# Patient Record
Sex: Female | Born: 1940 | Race: Black or African American | Hispanic: No | Marital: Married | Smoking: Never smoker
Health system: Southern US, Community
[De-identification: ages and names within clinical notes are randomized; demographics above are authoritative.]

## PROBLEM LIST (undated history)

## (undated) ENCOUNTER — Emergency Department (HOSPITAL_BASED_OUTPATIENT_CLINIC_OR_DEPARTMENT_OTHER): Admission: EM

## (undated) DIAGNOSIS — Z9889 Other specified postprocedural states: Secondary | ICD-10-CM

## (undated) DIAGNOSIS — G8929 Other chronic pain: Secondary | ICD-10-CM

## (undated) DIAGNOSIS — G971 Other reaction to spinal and lumbar puncture: Secondary | ICD-10-CM

## (undated) DIAGNOSIS — M545 Low back pain, unspecified: Secondary | ICD-10-CM

## (undated) DIAGNOSIS — M48061 Spinal stenosis, lumbar region without neurogenic claudication: Secondary | ICD-10-CM

## (undated) DIAGNOSIS — Z972 Presence of dental prosthetic device (complete) (partial): Secondary | ICD-10-CM

## (undated) DIAGNOSIS — Z8619 Personal history of other infectious and parasitic diseases: Secondary | ICD-10-CM

## (undated) DIAGNOSIS — I2510 Atherosclerotic heart disease of native coronary artery without angina pectoris: Secondary | ICD-10-CM

## (undated) DIAGNOSIS — M542 Cervicalgia: Secondary | ICD-10-CM

## (undated) DIAGNOSIS — K759 Inflammatory liver disease, unspecified: Secondary | ICD-10-CM

## (undated) DIAGNOSIS — Z9289 Personal history of other medical treatment: Secondary | ICD-10-CM

## (undated) DIAGNOSIS — R599 Enlarged lymph nodes, unspecified: Secondary | ICD-10-CM

## (undated) DIAGNOSIS — Z8719 Personal history of other diseases of the digestive system: Secondary | ICD-10-CM

## (undated) DIAGNOSIS — I2584 Coronary atherosclerosis due to calcified coronary lesion: Secondary | ICD-10-CM

## (undated) DIAGNOSIS — G5621 Lesion of ulnar nerve, right upper limb: Secondary | ICD-10-CM

## (undated) DIAGNOSIS — K2970 Gastritis, unspecified, without bleeding: Secondary | ICD-10-CM

## (undated) DIAGNOSIS — I10 Essential (primary) hypertension: Secondary | ICD-10-CM

## (undated) DIAGNOSIS — M1990 Unspecified osteoarthritis, unspecified site: Secondary | ICD-10-CM

## (undated) DIAGNOSIS — Z973 Presence of spectacles and contact lenses: Secondary | ICD-10-CM

## (undated) DIAGNOSIS — Z8489 Family history of other specified conditions: Secondary | ICD-10-CM

## (undated) DIAGNOSIS — K219 Gastro-esophageal reflux disease without esophagitis: Secondary | ICD-10-CM

## (undated) DIAGNOSIS — R112 Nausea with vomiting, unspecified: Secondary | ICD-10-CM

## (undated) HISTORY — PX: BREAST SURGERY: SHX581

## (undated) HISTORY — PX: COLONOSCOPY W/ BIOPSIES AND POLYPECTOMY: SHX1376

## (undated) HISTORY — PX: PATELLA FRACTURE SURGERY: SHX735

## (undated) HISTORY — PX: CHOLECYSTECTOMY OPEN: SUR202

## (undated) HISTORY — PX: TOTAL KNEE ARTHROPLASTY: SHX125

## (undated) HISTORY — PX: CATARACT EXTRACTION W/ INTRAOCULAR LENS IMPLANT: SHX1309

## (undated) HISTORY — DX: Atherosclerotic heart disease of native coronary artery without angina pectoris: I25.10

## (undated) HISTORY — PX: ABDOMINAL HERNIA REPAIR: SHX539

## (undated) HISTORY — PX: BLADDER SUSPENSION: SHX72

## (undated) HISTORY — DX: Coronary atherosclerosis due to calcified coronary lesion: I25.84

## (undated) HISTORY — PX: REPLACEMENT TOTAL KNEE: SUR1224

## (undated) HISTORY — PX: MULTIPLE TOOTH EXTRACTIONS: SHX2053

## (undated) HISTORY — PX: CARPAL TUNNEL RELEASE: SHX101

## (undated) HISTORY — PX: DILATION AND CURETTAGE OF UTERUS: SHX78

## (undated) HISTORY — PX: REDUCTION MAMMAPLASTY: SUR839

## (undated) HISTORY — PX: JOINT REPLACEMENT: SHX530

## (undated) HISTORY — PX: BREAST CYST EXCISION: SHX579

## (undated) HISTORY — PX: APPENDECTOMY: SHX54

## (undated) HISTORY — PX: HERNIA REPAIR: SHX51

## (undated) HISTORY — PX: BACK SURGERY: SHX140

## (undated) HISTORY — PX: CARDIAC CATHETERIZATION: SHX172

---

## 1970-11-05 HISTORY — PX: VAGINAL HYSTERECTOMY: SUR661

## 1979-07-07 HISTORY — PX: CHOLECYSTECTOMY OPEN: SUR202

## 1990-11-05 DIAGNOSIS — Z9289 Personal history of other medical treatment: Secondary | ICD-10-CM | POA: Insufficient documentation

## 2002-11-05 HISTORY — PX: REDUCTION MAMMAPLASTY: SUR839

## 2003-01-08 ENCOUNTER — Encounter: Payer: Self-pay | Admitting: Internal Medicine

## 2003-01-08 ENCOUNTER — Encounter: Admission: RE | Admit: 2003-01-08 | Discharge: 2003-01-08 | Payer: Self-pay | Admitting: Internal Medicine

## 2004-11-14 ENCOUNTER — Encounter: Admission: RE | Admit: 2004-11-14 | Discharge: 2004-11-14 | Payer: Self-pay | Admitting: Internal Medicine

## 2009-07-06 HISTORY — PX: CARPAL TUNNEL RELEASE: SHX101

## 2010-03-07 ENCOUNTER — Ambulatory Visit: Payer: Self-pay | Admitting: Diagnostic Radiology

## 2010-03-07 ENCOUNTER — Emergency Department (HOSPITAL_BASED_OUTPATIENT_CLINIC_OR_DEPARTMENT_OTHER): Admission: EM | Admit: 2010-03-07 | Discharge: 2010-03-07 | Payer: Self-pay | Admitting: Emergency Medicine

## 2010-04-24 ENCOUNTER — Encounter: Admission: RE | Admit: 2010-04-24 | Discharge: 2010-04-24 | Payer: Self-pay | Admitting: Internal Medicine

## 2010-06-12 ENCOUNTER — Ambulatory Visit (HOSPITAL_BASED_OUTPATIENT_CLINIC_OR_DEPARTMENT_OTHER)
Admission: RE | Admit: 2010-06-12 | Discharge: 2010-06-12 | Payer: Self-pay | Admitting: Physical Medicine and Rehabilitation

## 2010-06-12 ENCOUNTER — Ambulatory Visit: Payer: Self-pay | Admitting: Radiology

## 2012-11-05 HISTORY — PX: CATARACT EXTRACTION W/ INTRAOCULAR LENS IMPLANT: SHX1309

## 2012-11-05 HISTORY — PX: TRANSURETHRAL RESECTION OF BLADDER TUMOR WITH GYRUS (TURBT-GYRUS): SHX6458

## 2013-11-05 HISTORY — PX: ANTERIOR AND POSTERIOR VAGINAL REPAIR: SUR5

## 2013-11-23 ENCOUNTER — Ambulatory Visit
Admission: RE | Admit: 2013-11-23 | Discharge: 2013-11-23 | Disposition: A | Discharge status: Home / Self Care | Payer: 59 | Source: Ambulatory Visit | Attending: Internal Medicine | Admitting: Internal Medicine

## 2013-11-23 ENCOUNTER — Other Ambulatory Visit: Payer: Self-pay | Admitting: Internal Medicine

## 2013-11-23 DIAGNOSIS — M25552 Pain in left hip: Secondary | ICD-10-CM

## 2014-03-17 DIAGNOSIS — N816 Rectocele: Secondary | ICD-10-CM | POA: Insufficient documentation

## 2014-04-12 DIAGNOSIS — N8112 Cystocele, lateral: Secondary | ICD-10-CM | POA: Insufficient documentation

## 2014-05-04 DIAGNOSIS — R339 Retention of urine, unspecified: Secondary | ICD-10-CM | POA: Insufficient documentation

## 2014-05-04 DIAGNOSIS — IMO0002 Reserved for concepts with insufficient information to code with codable children: Secondary | ICD-10-CM | POA: Insufficient documentation

## 2014-06-15 DIAGNOSIS — H9319 Tinnitus, unspecified ear: Secondary | ICD-10-CM | POA: Insufficient documentation

## 2014-06-15 DIAGNOSIS — M19049 Primary osteoarthritis, unspecified hand: Secondary | ICD-10-CM | POA: Insufficient documentation

## 2014-06-15 DIAGNOSIS — G4700 Insomnia, unspecified: Secondary | ICD-10-CM | POA: Insufficient documentation

## 2014-06-15 DIAGNOSIS — M47817 Spondylosis without myelopathy or radiculopathy, lumbosacral region: Secondary | ICD-10-CM | POA: Insufficient documentation

## 2014-06-15 DIAGNOSIS — J019 Acute sinusitis, unspecified: Secondary | ICD-10-CM | POA: Insufficient documentation

## 2014-06-15 DIAGNOSIS — N393 Stress incontinence (female) (male): Secondary | ICD-10-CM | POA: Insufficient documentation

## 2014-06-15 DIAGNOSIS — R040 Epistaxis: Secondary | ICD-10-CM | POA: Insufficient documentation

## 2014-06-15 DIAGNOSIS — H93239 Hyperacusis, unspecified ear: Secondary | ICD-10-CM | POA: Insufficient documentation

## 2014-06-15 DIAGNOSIS — N329 Bladder disorder, unspecified: Secondary | ICD-10-CM | POA: Insufficient documentation

## 2014-06-15 DIAGNOSIS — J301 Allergic rhinitis due to pollen: Secondary | ICD-10-CM | POA: Insufficient documentation

## 2014-06-15 DIAGNOSIS — H9209 Otalgia, unspecified ear: Secondary | ICD-10-CM | POA: Insufficient documentation

## 2014-06-15 DIAGNOSIS — M19039 Primary osteoarthritis, unspecified wrist: Secondary | ICD-10-CM | POA: Insufficient documentation

## 2014-06-15 DIAGNOSIS — R202 Paresthesia of skin: Secondary | ICD-10-CM | POA: Insufficient documentation

## 2014-06-15 DIAGNOSIS — R10814 Left lower quadrant abdominal tenderness: Secondary | ICD-10-CM | POA: Insufficient documentation

## 2014-06-15 DIAGNOSIS — R32 Unspecified urinary incontinence: Secondary | ICD-10-CM | POA: Insufficient documentation

## 2014-10-25 ENCOUNTER — Emergency Department (HOSPITAL_BASED_OUTPATIENT_CLINIC_OR_DEPARTMENT_OTHER)
Admission: EM | Admit: 2014-10-25 | Discharge: 2014-10-25 | Disposition: A | Discharge status: Home / Self Care | Payer: Medicare Other | Attending: Emergency Medicine | Admitting: Emergency Medicine

## 2014-10-25 ENCOUNTER — Encounter (HOSPITAL_BASED_OUTPATIENT_CLINIC_OR_DEPARTMENT_OTHER): Payer: Self-pay | Admitting: *Deleted

## 2014-10-25 ENCOUNTER — Emergency Department (HOSPITAL_BASED_OUTPATIENT_CLINIC_OR_DEPARTMENT_OTHER): Payer: Medicare Other

## 2014-10-25 DIAGNOSIS — K219 Gastro-esophageal reflux disease without esophagitis: Secondary | ICD-10-CM | POA: Diagnosis not present

## 2014-10-25 DIAGNOSIS — R1012 Left upper quadrant pain: Secondary | ICD-10-CM | POA: Diagnosis present

## 2014-10-25 DIAGNOSIS — Z90710 Acquired absence of both cervix and uterus: Secondary | ICD-10-CM | POA: Insufficient documentation

## 2014-10-25 DIAGNOSIS — R112 Nausea with vomiting, unspecified: Secondary | ICD-10-CM | POA: Diagnosis not present

## 2014-10-25 DIAGNOSIS — M1990 Unspecified osteoarthritis, unspecified site: Secondary | ICD-10-CM | POA: Diagnosis not present

## 2014-10-25 DIAGNOSIS — Z9049 Acquired absence of other specified parts of digestive tract: Secondary | ICD-10-CM | POA: Diagnosis not present

## 2014-10-25 DIAGNOSIS — R197 Diarrhea, unspecified: Secondary | ICD-10-CM | POA: Diagnosis not present

## 2014-10-25 DIAGNOSIS — I10 Essential (primary) hypertension: Secondary | ICD-10-CM | POA: Insufficient documentation

## 2014-10-25 DIAGNOSIS — R109 Unspecified abdominal pain: Secondary | ICD-10-CM

## 2014-10-25 DIAGNOSIS — Z79899 Other long term (current) drug therapy: Secondary | ICD-10-CM | POA: Diagnosis not present

## 2014-10-25 DIAGNOSIS — Z880 Allergy status to penicillin: Secondary | ICD-10-CM | POA: Insufficient documentation

## 2014-10-25 HISTORY — DX: Gastritis, unspecified, without bleeding: K29.70

## 2014-10-25 HISTORY — DX: Essential (primary) hypertension: I10

## 2014-10-25 HISTORY — DX: Gastro-esophageal reflux disease without esophagitis: K21.9

## 2014-10-25 HISTORY — DX: Unspecified osteoarthritis, unspecified site: M19.90

## 2014-10-25 LAB — URINALYSIS, ROUTINE W REFLEX MICROSCOPIC
BILIRUBIN URINE: NEGATIVE
GLUCOSE, UA: NEGATIVE mg/dL
HGB URINE DIPSTICK: NEGATIVE
KETONES UR: NEGATIVE mg/dL
Leukocytes, UA: NEGATIVE
Nitrite: NEGATIVE
PROTEIN: NEGATIVE mg/dL
SPECIFIC GRAVITY, URINE: 1.013 (ref 1.005–1.030)
Urobilinogen, UA: 0.2 mg/dL (ref 0.0–1.0)
pH: 5.5 (ref 5.0–8.0)

## 2014-10-25 LAB — CBC WITH DIFFERENTIAL/PLATELET
BASOS PCT: 0 % (ref 0–1)
Basophils Absolute: 0 10*3/uL (ref 0.0–0.1)
EOS ABS: 0.2 10*3/uL (ref 0.0–0.7)
EOS PCT: 3 % (ref 0–5)
HEMATOCRIT: 39 % (ref 36.0–46.0)
HEMOGLOBIN: 12.5 g/dL (ref 12.0–15.0)
LYMPHS PCT: 30 % (ref 12–46)
Lymphs Abs: 2.3 10*3/uL (ref 0.7–4.0)
MCH: 28.5 pg (ref 26.0–34.0)
MCHC: 32.1 g/dL (ref 30.0–36.0)
MCV: 88.8 fL (ref 78.0–100.0)
MONO ABS: 0.7 10*3/uL (ref 0.1–1.0)
Monocytes Relative: 10 % (ref 3–12)
Neutro Abs: 4.2 10*3/uL (ref 1.7–7.7)
Neutrophils Relative %: 57 % (ref 43–77)
Platelets: 247 10*3/uL (ref 150–400)
RBC: 4.39 MIL/uL (ref 3.87–5.11)
RDW: 13.8 % (ref 11.5–15.5)
WBC: 7.5 10*3/uL (ref 4.0–10.5)

## 2014-10-25 LAB — COMPREHENSIVE METABOLIC PANEL
ALT: 11 U/L (ref 0–35)
ANION GAP: 10 (ref 5–15)
AST: 13 U/L (ref 0–37)
Albumin: 3.6 g/dL (ref 3.5–5.2)
Alkaline Phosphatase: 56 U/L (ref 39–117)
BILIRUBIN TOTAL: 0.3 mg/dL (ref 0.3–1.2)
BUN: 8 mg/dL (ref 6–23)
CALCIUM: 8.9 mg/dL (ref 8.4–10.5)
CHLORIDE: 105 meq/L (ref 96–112)
CO2: 27 meq/L (ref 19–32)
CREATININE: 0.8 mg/dL (ref 0.50–1.10)
GFR calc Af Amer: 82 mL/min — ABNORMAL LOW (ref >90)
GFR, EST NON AFRICAN AMERICAN: 71 mL/min — AB (ref >90)
GLUCOSE: 105 mg/dL — AB (ref 70–99)
Potassium: 4.2 mEq/L (ref 3.7–5.3)
Sodium: 142 mEq/L (ref 137–147)
Total Protein: 6.7 g/dL (ref 6.0–8.3)

## 2014-10-25 LAB — LIPASE, BLOOD: LIPASE: 19 U/L (ref 11–59)

## 2014-10-25 LAB — TROPONIN I

## 2014-10-25 MED ORDER — ONDANSETRON HCL 4 MG PO TABS: 4.0000 mg | ORAL_TABLET | Freq: Four times a day (QID) | ORAL | Status: DC

## 2014-10-25 MED ORDER — DICYCLOMINE HCL 10 MG PO CAPS: 10.0000 mg | ORAL_CAPSULE | Freq: Three times a day (TID) | ORAL | Status: DC | PRN

## 2014-10-25 MED ORDER — GI COCKTAIL ~~LOC~~
30.0000 mL | Freq: Once | ORAL | Status: AC
  Administered 2014-10-25: 30 mL via ORAL
  Filled 2014-10-25: qty 30

## 2014-10-25 MED ORDER — HYDROMORPHONE HCL 1 MG/ML IJ SOLN
1.0000 mg | Freq: Once | INTRAMUSCULAR | Status: AC
  Administered 2014-10-25 (×2): 1 mg via INTRAVENOUS
  Filled 2014-10-25: qty 1

## 2014-10-25 MED ORDER — HYDROMORPHONE HCL 1 MG/ML IJ SOLN
INTRAMUSCULAR | Status: AC
  Administered 2014-10-25: 1 mg via INTRAVENOUS
  Filled 2014-10-25: qty 1

## 2014-10-25 MED ORDER — SODIUM CHLORIDE 0.9 % IV BOLUS (SEPSIS)
500.0000 mL | Freq: Once | INTRAVENOUS | Status: AC
  Administered 2014-10-25: 500 mL via INTRAVENOUS

## 2014-10-25 MED ORDER — ONDANSETRON HCL 4 MG/2ML IJ SOLN
4.0000 mg | Freq: Once | INTRAMUSCULAR | Status: AC
  Administered 2014-10-25: 4 mg via INTRAVENOUS
  Filled 2014-10-25: qty 2

## 2014-10-25 NOTE — ED Notes (Signed)
Pt states she had a "bad reaction" to contrast during a previous scan "years ago". Pt states "they told me never to take contrast again." dr. Ralene Bathe alerted, orders scan without contrast.

## 2014-10-25 NOTE — ED Notes (Signed)
Abdominal pain. Hx of gastritis. Vomiting.

## 2014-10-25 NOTE — ED Provider Notes (Signed)
CSN: [id number]     Arrival date & time 10/25/14  1438 History  This chart was scribed for Quintella Reichert, MD by [facility], ED Scribe. The patient was seen in MH10/MH10 and the patient's care was started at 3:05 PM.    Chief Complaint  Patient presents with  . Abdominal Pain  . Emesis   HPI  HPI Comments: Wendy Carrillo is a 73 y.o. female with a history of HTN and stomach ulcers who presents to the Emergency Department complaining of constant, gradually worsening upper abdominal pain, onset 4 days ago. She describes the pain as a bloated, gassy sensation. The pain radiates to her back and left flank. Pt has chills, vomiting x3, mucous and diarrhea as associated symptoms. She has tried medication and herbal tea for relief.  Pt has eaten little food since onset. Symptoms are moderate, intermittent, worsening. She denies dysuria and fever.  Pt does not drink alcohol or take blood thinners.   Past Medical History  Diagnosis Date  . Gastritis   . Hypertension   . GERD (gastroesophageal reflux disease)   . Arthritis    Past Surgical History  Procedure Laterality Date  . Replacement total knee    . Cholecystectomy    . Abdominal hysterectomy    . Appendectomy    . Bladder tack    . Carpal tunnel release     No family history on file. History  Substance Use Topics  . Smoking status: Never Smoker   . Smokeless tobacco: Not on file  . Alcohol Use: No   OB History    No data available     Review of Systems  Constitutional: Positive for chills. Negative for fever.  Gastrointestinal: Positive for vomiting, abdominal pain and diarrhea.  Genitourinary: Positive for flank pain. Negative for dysuria.  Musculoskeletal: Positive for back pain.      Allergies  Penicillins  Home Medications   Prior to Admission medications   Medication Sig Start Date End Date Taking? Authorizing Provider  Celecoxib (CELEBREX PO) Take by mouth.   Yes Historical Provider, MD   OMEPRAZOLE PO Take by mouth.   Yes Historical Provider, MD  RAMIPRIL PO Take by mouth.   Yes Historical Provider, MD   BP 127/63 mmHg  Pulse 70  Temp(Src) 98 F (36.7 C) (Oral)  Resp 18  Ht 5' 4.5" (1.638 m)  Wt 242 lb (109.77 kg)  BMI 40.91 kg/m2  SpO2 100% Physical Exam  Constitutional: She is oriented to person, place, and time. She appears well-developed and well-nourished.  HENT:  Head: Normocephalic and atraumatic.  Cardiovascular: Normal rate and regular rhythm.   No murmur heard. Pulmonary/Chest: Effort normal and breath sounds normal. No respiratory distress.  Abdominal: Soft. There is no rebound and no guarding.  Mild epigastric tenderness without guarding or rebound  Musculoskeletal: She exhibits no edema or tenderness.  Neurological: She is alert and oriented to person, place, and time.  Skin: Skin is warm and dry.  Psychiatric: She has a normal mood and affect. Her behavior is normal.  Nursing note and vitals reviewed.   ED Course  Procedures  DIAGNOSTIC STUDIES: Oxygen Saturation is 100% on room air, normal by my interpretation.    COORDINATION OF CARE: 3:10 PM Discussed treatment plan with pt at bedside and pt agreed to plan.  Labs Review Labs Reviewed  COMPREHENSIVE METABOLIC PANEL - Abnormal; Notable for the following:    Glucose, Bld 105 (*)    GFR calc non Af  Amer 71 (*)    GFR calc Af Amer 82 (*)    All other components within normal limits  TROPONIN I  LIPASE, BLOOD  CBC WITH DIFFERENTIAL  URINALYSIS, ROUTINE W REFLEX MICROSCOPIC    Imaging Review Ct Abdomen Pelvis Wo Contrast  10/25/2014   CLINICAL DATA:  Right lower quadrant abdominal pain. History of gastritis. Reported allergy to contrast, no further information available. Referring physician declined premedication.  EXAM: CT ABDOMEN AND PELVIS WITHOUT CONTRAST  TECHNIQUE: Multidetector CT imaging of the abdomen and pelvis was performed following the standard protocol without IV contrast.   COMPARISON:  06/23/2013  FINDINGS: Lung bases are clear.  Unenhanced CT was performed per clinician order. Lack of IV contrast limits sensitivity and specificity, especially for evaluation of abdominal/pelvic solid viscera. Moderate coronary arterial calcification identified. Heart size normal. Small hiatal hernia. Cholecystectomy clips are noted. Anterior abdominal wall calcified sutures incidentally noted. Unenhanced liver, adrenal glands, kidneys, spleen, and pancreas are normal. 5.0 cm right upper renal pole cortical cyst identified image 38. No free air or fluid. No lymphadenopathy.  Normal appendix. No bowel wall thickening or focal segmental dilatation is identified. Bladder is decompressed but unremarkable. Uterus and ovaries are normal. Mild atheromatous aortic calcification without aneurysm. Multilevel disc degenerative change noted without acute osseous abnormality.  IMPRESSION: No acute intra-abdominal or pelvic pathology.   Electronically Signed   By: Conchita Paris M.D.   On: 10/25/2014 20:46     EKG Interpretation   Date/Time:  Monday October 25 2014 15:36:54 EST Ventricular Rate:  71 PR Interval:  168 QRS Duration: 82 QT Interval:  412 QTC Calculation: 447 R Axis:   -23 Text Interpretation:  Sinus rhythm with Premature atrial complexes  Moderate voltage criteria for LVH, may be normal variant Borderline ECG  Confirmed by Hazle Coca [phone]) on 10/25/2014 4:04:11 PM      MDM   Final diagnoses:  Nausea vomiting and diarrhea  Left upper quadrant pain   Patient here for evaluation of epigastric and left upper quadrant abdominal pain. Patient did have vomiting and diarrhea prior to ED arrival, this is resolved in the emergency department. Clinical picture is not consistent with ACS, dissection, acute bacterial infection, small bowel obstruction.  Patient improved in the emergency department on recheck. Discussed with patient PCP follow-up and return precautions. Treated with  Bentyl, zofran and continuing patient's current ppi.    I personally performed the services described in this documentation, which was scribed in my presence. The recorded information has been reviewed and is accurate.     Quintella Reichert, MD 10/26/14 [phone]

## 2014-10-25 NOTE — Discharge Instructions (Signed)

## 2015-02-22 ENCOUNTER — Encounter (HOSPITAL_BASED_OUTPATIENT_CLINIC_OR_DEPARTMENT_OTHER): Payer: Self-pay

## 2015-02-22 ENCOUNTER — Observation Stay (HOSPITAL_BASED_OUTPATIENT_CLINIC_OR_DEPARTMENT_OTHER)
Admission: EM | Admit: 2015-02-22 | Discharge: 2015-02-24 | Disposition: A | Discharge status: Home / Self Care | Payer: Medicare Other | Attending: Internal Medicine | Admitting: Internal Medicine

## 2015-02-22 ENCOUNTER — Emergency Department (HOSPITAL_BASED_OUTPATIENT_CLINIC_OR_DEPARTMENT_OTHER): Payer: Medicare Other

## 2015-02-22 DIAGNOSIS — Z90710 Acquired absence of both cervix and uterus: Secondary | ICD-10-CM | POA: Diagnosis not present

## 2015-02-22 DIAGNOSIS — R079 Chest pain, unspecified: Secondary | ICD-10-CM | POA: Diagnosis present

## 2015-02-22 DIAGNOSIS — Z96659 Presence of unspecified artificial knee joint: Secondary | ICD-10-CM | POA: Insufficient documentation

## 2015-02-22 DIAGNOSIS — Z91041 Radiographic dye allergy status: Secondary | ICD-10-CM | POA: Diagnosis not present

## 2015-02-22 DIAGNOSIS — G8929 Other chronic pain: Secondary | ICD-10-CM | POA: Diagnosis not present

## 2015-02-22 DIAGNOSIS — R519 Headache, unspecified: Secondary | ICD-10-CM

## 2015-02-22 DIAGNOSIS — Z880 Allergy status to penicillin: Secondary | ICD-10-CM | POA: Insufficient documentation

## 2015-02-22 DIAGNOSIS — R0781 Pleurodynia: Secondary | ICD-10-CM

## 2015-02-22 DIAGNOSIS — K219 Gastro-esophageal reflux disease without esophagitis: Secondary | ICD-10-CM | POA: Diagnosis present

## 2015-02-22 DIAGNOSIS — R51 Headache: Secondary | ICD-10-CM

## 2015-02-22 DIAGNOSIS — R0789 Other chest pain: Principal | ICD-10-CM | POA: Insufficient documentation

## 2015-02-22 DIAGNOSIS — I10 Essential (primary) hypertension: Secondary | ICD-10-CM | POA: Diagnosis present

## 2015-02-22 DIAGNOSIS — M549 Dorsalgia, unspecified: Secondary | ICD-10-CM

## 2015-02-22 HISTORY — DX: Family history of other specified conditions: Z84.89

## 2015-02-22 HISTORY — DX: Personal history of other diseases of the digestive system: Z87.19

## 2015-02-22 HISTORY — DX: Personal history of other medical treatment: Z92.89

## 2015-02-22 HISTORY — DX: Low back pain, unspecified: M54.50

## 2015-02-22 HISTORY — DX: Other chronic pain: G89.29

## 2015-02-22 HISTORY — DX: Spinal stenosis, lumbar region without neurogenic claudication: M48.061

## 2015-02-22 HISTORY — DX: Low back pain: M54.5

## 2015-02-22 HISTORY — DX: Inflammatory liver disease, unspecified: K75.9

## 2015-02-22 LAB — COMPREHENSIVE METABOLIC PANEL
ALBUMIN: 3.6 g/dL (ref 3.5–5.2)
ALK PHOS: 52 U/L (ref 39–117)
ALT: 12 U/L (ref 0–35)
ANION GAP: 5 (ref 5–15)
AST: 14 U/L (ref 0–37)
BUN: 12 mg/dL (ref 6–23)
CHLORIDE: 104 mmol/L (ref 96–112)
CO2: 30 mmol/L (ref 19–32)
Calcium: 8.8 mg/dL (ref 8.4–10.5)
Creatinine, Ser: 0.76 mg/dL (ref 0.50–1.10)
GFR calc Af Amer: >90 mL/min (ref >90)
GFR, EST NON AFRICAN AMERICAN: 81 mL/min — AB (ref >90)
Glucose, Bld: 110 mg/dL — ABNORMAL HIGH (ref 70–99)
POTASSIUM: 4.3 mmol/L (ref 3.5–5.1)
Sodium: 139 mmol/L (ref 135–145)
Total Bilirubin: 0.5 mg/dL (ref 0.3–1.2)
Total Protein: 6.5 g/dL (ref 6.0–8.3)

## 2015-02-22 LAB — URINALYSIS, ROUTINE W REFLEX MICROSCOPIC
Bilirubin Urine: NEGATIVE
Glucose, UA: NEGATIVE mg/dL
Hgb urine dipstick: NEGATIVE
Ketones, ur: NEGATIVE mg/dL
Leukocytes, UA: NEGATIVE
NITRITE: NEGATIVE
PROTEIN: NEGATIVE mg/dL
Specific Gravity, Urine: 1.009 (ref 1.005–1.030)
Urobilinogen, UA: 0.2 mg/dL (ref 0.0–1.0)
pH: 7.5 (ref 5.0–8.0)

## 2015-02-22 LAB — CBC WITH DIFFERENTIAL/PLATELET
Basophils Absolute: 0 10*3/uL (ref 0.0–0.1)
Basophils Relative: 0 % (ref 0–1)
Eosinophils Absolute: 0.2 10*3/uL (ref 0.0–0.7)
Eosinophils Relative: 3 % (ref 0–5)
HCT: 39.7 % (ref 36.0–46.0)
Hemoglobin: 12.6 g/dL (ref 12.0–15.0)
Lymphocytes Relative: 28 % (ref 12–46)
Lymphs Abs: 1.7 10*3/uL (ref 0.7–4.0)
MCH: 28.3 pg (ref 26.0–34.0)
MCHC: 31.7 g/dL (ref 30.0–36.0)
MCV: 89.2 fL (ref 78.0–100.0)
MONOS PCT: 10 % (ref 3–12)
Monocytes Absolute: 0.6 10*3/uL (ref 0.1–1.0)
NEUTROS PCT: 59 % (ref 43–77)
Neutro Abs: 3.6 10*3/uL (ref 1.7–7.7)
Platelets: 219 10*3/uL (ref 150–400)
RBC: 4.45 MIL/uL (ref 3.87–5.11)
RDW: 13.7 % (ref 11.5–15.5)
WBC: 6 10*3/uL (ref 4.0–10.5)

## 2015-02-22 LAB — D-DIMER, QUANTITATIVE: D-Dimer, Quant: 0.76 ug/mL-FEU — ABNORMAL HIGH (ref 0.00–0.48)

## 2015-02-22 LAB — TROPONIN I

## 2015-02-22 MED ORDER — LORATADINE 10 MG PO TABS
10.0000 mg | ORAL_TABLET | Freq: Every day | ORAL | Status: DC
  Administered 2015-02-23 – 2015-02-24 (×2): 10 mg via ORAL
  Filled 2015-02-22 (×2): qty 1

## 2015-02-22 MED ORDER — RAMIPRIL 2.5 MG PO CAPS
2.5000 mg | ORAL_CAPSULE | Freq: Every day | ORAL | Status: DC
  Administered 2015-02-23 – 2015-02-24 (×2): 2.5 mg via ORAL
  Filled 2015-02-22 (×2): qty 1

## 2015-02-22 MED ORDER — CYCLOBENZAPRINE HCL 10 MG PO TABS: 10.0000 mg | ORAL_TABLET | Freq: Every evening | ORAL | Status: DC | PRN

## 2015-02-22 MED ORDER — HEPARIN (PORCINE) IN NACL 100-0.45 UNIT/ML-% IJ SOLN
INTRAMUSCULAR | Status: AC
  Administered 2015-02-23: 1300 [IU]/h via INTRAVENOUS
  Filled 2015-02-22: qty 250

## 2015-02-22 MED ORDER — SODIUM CHLORIDE 0.9 % IV BOLUS (SEPSIS)
500.0000 mL | Freq: Once | INTRAVENOUS | Status: AC
  Administered 2015-02-22: 500 mL via INTRAVENOUS

## 2015-02-22 MED ORDER — GABAPENTIN 300 MG PO CAPS
300.0000 mg | ORAL_CAPSULE | Freq: Every day | ORAL | Status: DC | PRN
  Administered 2015-02-24: 300 mg via ORAL
  Filled 2015-02-22 (×2): qty 1

## 2015-02-22 MED ORDER — HYDROCODONE-ACETAMINOPHEN 5-325 MG PO TABS
1.0000 | ORAL_TABLET | ORAL | Status: DC | PRN
Start: 2015-02-22 — End: 2015-02-24
  Administered 2015-02-22 – 2015-02-23 (×3): 1 via ORAL
  Filled 2015-02-22 (×3): qty 1

## 2015-02-22 MED ORDER — CELECOXIB 200 MG PO CAPS
200.0000 mg | ORAL_CAPSULE | Freq: Every day | ORAL | Status: DC | PRN
  Filled 2015-02-22: qty 1

## 2015-02-22 MED ORDER — PANTOPRAZOLE SODIUM 40 MG PO TBEC
40.0000 mg | DELAYED_RELEASE_TABLET | Freq: Every day | ORAL | Status: DC
  Administered 2015-02-23: 40 mg via ORAL

## 2015-02-22 MED ORDER — FENTANYL CITRATE (PF) 100 MCG/2ML IJ SOLN
50.0000 ug | Freq: Once | INTRAMUSCULAR | Status: AC
  Administered 2015-02-22: 50 ug via INTRAVENOUS
  Filled 2015-02-22: qty 2

## 2015-02-22 MED ORDER — HEPARIN BOLUS VIA INFUSION
4000.0000 [IU] | Freq: Once | INTRAVENOUS | Status: AC
  Administered 2015-02-22: 4000 [IU] via INTRAVENOUS

## 2015-02-22 MED ORDER — ALPRAZOLAM 0.25 MG PO TABS: 0.2500 mg | ORAL_TABLET | Freq: Every evening | ORAL | Status: DC | PRN

## 2015-02-22 MED ORDER — HEPARIN (PORCINE) IN NACL 100-0.45 UNIT/ML-% IJ SOLN
1300.0000 [IU]/h | INTRAMUSCULAR | Status: DC
  Administered 2015-02-22 – 2015-02-23 (×2): 1300 [IU]/h via INTRAVENOUS
  Filled 2015-02-22 (×2): qty 250

## 2015-02-22 MED ORDER — METOCLOPRAMIDE HCL 5 MG/ML IJ SOLN
5.0000 mg | Freq: Once | INTRAMUSCULAR | Status: AC
  Administered 2015-02-22: 5 mg via INTRAVENOUS
  Filled 2015-02-22: qty 2

## 2015-02-22 NOTE — Progress Notes (Signed)
MD made aware of patient arrival via text Page

## 2015-02-22 NOTE — H&P (Signed)
[facility] History and Physical  Wendy Carrillo YCX:[id number] DOB: 09/18/1941 DOA: 02/22/2015  Referring physician: EDP PCP: No primary care provider on file.   Chief Complaint: Chest pain   HPI: Wendy Carrillo is a 74 y.o. female who presents with left sided, lateral, chest pain.  Pain is pleuritic, worse with deep breaths, and has been ongoing for the past couple of days.  Sharp in quality.  Patient had a recent trip to [place] in the past couple of weeks.  On Sat she developed left groin pain which then eased off as her chest pain started shortly there after she states.  D.Dimer was positive in ED, patient cannot get CTA chest due to contrast allergy so she has been put on empiric heparin gtt and transferred to [facility] for VQ scan.  Review of Systems: Systems reviewed.  As above, otherwise negative  Past Medical History  Diagnosis Date  . Gastritis   . Hypertension   . GERD (gastroesophageal reflux disease)   . Arthritis    Past Surgical History  Procedure Laterality Date  . Replacement total knee    . Cholecystectomy    . Abdominal hysterectomy    . Appendectomy    . Bladder tack    . Carpal tunnel release     Social History:  reports that she has never smoked. She does not have any smokeless tobacco history on file. She reports that she does not drink alcohol or use illicit drugs.  Allergies  Allergen Reactions  . Contrast Media [Iodinated Diagnostic Agents] Swelling and Rash    Face, tongue  . Baclofen Itching  . Tinidazole   . Penicillins Rash    No family history on file.   Prior to Admission medications   Medication Sig Start Date End Date Taking? Authorizing Provider  ALPRAZolam (XANAX) 0.25 MG tablet Take 0.25 mg by mouth daily as needed. 05/16/10  Yes Historical Provider, MD  celecoxib (CELEBREX) 200 MG capsule Take 200 mg by mouth daily as needed for mild pain.  05/16/10  Yes Historical Provider, MD  cetirizine (ZYRTEC) 10 MG tablet Take 10 mg by  mouth daily as needed for allergies.  01/24/15  Yes Historical Provider, MD  cyclobenzaprine (FLEXERIL) 10 MG tablet Take 10 mg by mouth at bedtime as needed. For muscle spasms 04/29/14  Yes Historical Provider, MD  gabapentin (NEURONTIN) 300 MG capsule Take 300 mg by mouth daily as needed (for pain).    Yes Historical Provider, MD  omeprazole-sodium bicarbonate (ZEGERID) 40-1100 MG per capsule Take 1 capsule by mouth every morning. 02/14/15  Yes Historical Provider, MD  ramipril (ALTACE) 2.5 MG capsule Take 2.5 mg by mouth daily. 02/17/15  Yes Historical Provider, MD  furosemide (LASIX) 20 MG tablet Take 20 mg by mouth daily as needed.    Historical Provider, MD  traMADol (ULTRAM-ER) 200 MG 24 hr tablet Take 200 mg by mouth daily as needed for pain.  01/31/15   Historical Provider, MD  Vitamin D, Ergocalciferol, (DRISDOL) 50000 UNITS CAPS capsule Take 50,000 Units by mouth once a week. 02/17/15   Historical Provider, MD   Physical Exam: Filed Vitals:   02/22/15 1842  BP: 138/72  Pulse: 66  Temp:   Resp: 20    BP 138/72 mmHg  Pulse 66  Temp(Src) 98 F (36.7 C) (Oral)  Resp 20  Ht 5\' 4"  (1.626 m)  Wt 108.863 kg (240 lb)  BMI 41.18 kg/m2  SpO2 99%  General Appearance:    Alert,  oriented, no distress, appears stated age  Head:    Normocephalic, atraumatic  Eyes:    PERRL, EOMI, sclera non-icteric        Nose:   Nares without drainage or epistaxis. Mucosa, turbinates normal  Throat:   Moist mucous membranes. Oropharynx without erythema or exudate.  Neck:   Supple. No carotid bruits.  No thyromegaly.  No lymphadenopathy.   Back:     No CVA tenderness, no spinal tenderness  Lungs:     Clear to auscultation bilaterally, without wheezes, rhonchi or rales  Chest wall:    No tenderness to palpitation  Heart:    Regular rate and rhythm without murmurs, gallops, rubs  Abdomen:     Soft, non-tender, nondistended, normal bowel sounds, no organomegaly  Genitalia:    deferred  Rectal:    deferred   Extremities:   No clubbing, cyanosis or edema.  Pulses:   2+ and symmetric all extremities  Skin:   Skin color, texture, turgor normal, no rashes or lesions  Lymph nodes:   Cervical, supraclavicular, and axillary nodes normal  Neurologic:   CNII-XII intact. Normal strength, sensation and reflexes      throughout    Labs on Admission:  Basic Metabolic Panel:  Recent Labs Lab 02/22/15 1330  NA 139  K 4.3  CL 104  CO2 30  GLUCOSE 110*  BUN 12  CREATININE 0.76  CALCIUM 8.8   Liver Function Tests:  Recent Labs Lab 02/22/15 1330  AST 14  ALT 12  ALKPHOS 52  BILITOT 0.5  PROT 6.5  ALBUMIN 3.6   No results for input(s): LIPASE, AMYLASE in the last 168 hours. No results for input(s): AMMONIA in the last 168 hours. CBC:  Recent Labs Lab 02/22/15 1330  WBC 6.0  NEUTROABS 3.6  HGB 12.6  HCT 39.7  MCV 89.2  PLT 219   Cardiac Enzymes:  Recent Labs Lab 02/22/15 1330  TROPONINI <0.03    BNP (last 3 results) No results for input(s): PROBNP in the last 8760 hours. CBG: No results for input(s): GLUCAP in the last 168 hours.  Radiological Exams on Admission: Dg Chest 2 View  02/22/2015   CLINICAL DATA:  Left chest pain and headache  EXAM: CHEST  2 VIEW  COMPARISON:  04/24/2010  FINDINGS: Mild bibasilar atelectasis. Mild cardiac enlargement without heart failure. Negative for pneumonia or effusion.  IMPRESSION: Mild bibasilar atelectasis.   Electronically Signed   By: Franchot Gallo M.D.   On: 02/22/2015 14:38    EKG: Independently reviewed.  Assessment/Plan Active Problems:   Chest pain   1. Chest pain - need to rule out PE 1. VQ scan in AM 2. NPO after midnight 3. Empiric heparin gtt per pharm consult 4. Venous duplex BLE 5. norco for pain 2. HTN - continue home meds    Code Status: Full Code  Family Communication: Family at bedside Disposition Plan: Admit to obs   Time spent: 50 min  Vitaly Wanat M. [facility] Pager [phone]  If  7AM-7PM, please contact the day team taking care of the patient [email or web address] Password [facility] 02/22/2015, 8:56 PM

## 2015-02-22 NOTE — Progress Notes (Signed)
ANTICOAGULATION CONSULT NOTE - Initial Consult  Pharmacy Consult for heparin Indication: pulmonary embolus  Allergies  Allergen Reactions  . Contrast Media [Iodinated Diagnostic Agents]   . Penicillins     rash    Patient Measurements: Height: 5\' 4"  (162.6 cm) Weight: 240 lb (108.863 kg) IBW/kg (Calculated) : 54.7 Heparin Dosing Weight: 80.6 kg  Vital Signs: Temp: 98 F (36.7 C) (04/19 1253) Temp Source: Oral (04/19 1253) BP: 133/66 mmHg (04/19 1518) Pulse Rate: 68 (04/19 1518)  Labs:  Recent Labs  02/22/15 1330  HGB 12.6  HCT 39.7  PLT 219  CREATININE 0.76  TROPONINI <0.03    Estimated Creatinine Clearance: 74.4 mL/min (by C-G formula based on Cr of 0.76).   Medical History: Past Medical History  Diagnosis Date  . Gastritis   . Hypertension   . GERD (gastroesophageal reflux disease)   . Arthritis     Medications:   (Not in a hospital admission)  Assessment: 35 yoF who presents with sharp and pleuritic chest pain for 1 week. D-dimer elevated at 0.76, initial troponin negative. Pharmacy consulted to dose heparin for r/o PE. No anticoagulation PTA. CBC wnl. SCr 0.74, CrCl 70-75 ml/min.  Goal of Therapy:  Heparin level 0.3-0.7 units/ml Monitor platelets by anticoagulation protocol: Yes   Plan:  Heparin 4000 unit IV bolus, then 1300 unit/hr 8 hour HL (12AM) Daily HL and CBC Monitor for signs of bleeding Follow up results of V/Q scan to confirm PE  Whitney Muse D 02/22/2015,3:51 PM   I agree with the above assessment and plan.   Hughes Better, PharmD, BCPS Clinical Pharmacist Pager: [phone] 02/22/2015 3:59 PM

## 2015-02-22 NOTE — Progress Notes (Signed)
Admissions paged again via text. Pt still needs admission orders. Will cont to monitor.  Petra Kuba RN

## 2015-02-22 NOTE — ED Notes (Signed)
Pt on heart monitor 

## 2015-02-22 NOTE — ED Notes (Signed)
CP and HA since last friday

## 2015-02-22 NOTE — ED Provider Notes (Signed)
CSN: [id number]     Arrival date & time 02/22/15  1238 History   First MD Initiated Contact with Patient 02/22/15 1332     Chief Complaint  Patient presents with  . Chest Pain     (Consider location/radiation/quality/duration/timing/severity/associated sxs/prior Treatment) Patient is a 74 y.o. female presenting with chest pain. The history is provided by the patient.  Chest Pain Pain location:  L lateral chest Pain quality: sharp   Pain radiates to:  Upper back Pain radiates to the back: yes   Pain severity:  Moderate Onset quality:  Sudden Duration:  1 week Timing:  Constant Progression:  Worsening Chronicity:  New Context: at rest   Relieved by:  Nothing Worsened by:  Nothing tried Ineffective treatments:  None tried Associated symptoms: no abdominal pain, no back pain, no cough, no dizziness, no fatigue, no fever, no headache, no nausea, no shortness of breath and not vomiting     Past Medical History  Diagnosis Date  . Gastritis   . Hypertension   . GERD (gastroesophageal reflux disease)   . Arthritis    Past Surgical History  Procedure Laterality Date  . Replacement total knee    . Cholecystectomy    . Abdominal hysterectomy    . Appendectomy    . Bladder tack    . Carpal tunnel release     No family history on file. History  Substance Use Topics  . Smoking status: Never Smoker   . Smokeless tobacco: Not on file  . Alcohol Use: No   OB History    No data available     Review of Systems  Constitutional: Negative for fever and fatigue.  HENT: Negative for congestion and drooling.   Eyes: Negative for pain.  Respiratory: Negative for cough and shortness of breath.   Cardiovascular: Positive for chest pain.  Gastrointestinal: Negative for nausea, vomiting, abdominal pain and diarrhea.  Genitourinary: Negative for dysuria and hematuria.  Musculoskeletal: Negative for back pain, gait problem and neck pain.  Skin: Negative for color change.   Neurological: Negative for dizziness and headaches.  Hematological: Negative for adenopathy.  Psychiatric/Behavioral: Negative for behavioral problems.  All other systems reviewed and are negative.     Allergies  Contrast media and Penicillins  Home Medications   Prior to Admission medications   Medication Sig Start Date End Date Taking? Authorizing Provider  Cyclobenzaprine HCl (FLEXERIL PO) Take by mouth.   Yes Historical Provider, MD  Celecoxib (CELEBREX PO) Take by mouth.    Historical Provider, MD  OMEPRAZOLE PO Take by mouth.    Historical Provider, MD  RAMIPRIL PO Take by mouth.    Historical Provider, MD   BP 131/70 mmHg  Pulse 65  Temp(Src) 98 F (36.7 C) (Oral)  Resp 18  Ht 5\' 4"  (1.626 m)  Wt 240 lb (108.863 kg)  BMI 41.18 kg/m2  SpO2 98% Physical Exam  Constitutional: She is oriented to person, place, and time. She appears well-developed and well-nourished.  HENT:  Head: Normocephalic and atraumatic.  Mouth/Throat: Oropharynx is clear and moist. No oropharyngeal exudate.  Eyes: Conjunctivae and EOM are normal. Pupils are equal, round, and reactive to light.  Neck: Normal range of motion. Neck supple.  Cardiovascular: Normal rate, regular rhythm, normal heart sounds and intact distal pulses.  Exam reveals no gallop and no friction rub.   No murmur heard. Pulmonary/Chest: Effort normal and breath sounds normal. No respiratory distress. She has no wheezes.  Abdominal: Soft. Bowel sounds are normal.  There is no tenderness. There is no rebound and no guarding.  Musculoskeletal: Normal range of motion. She exhibits no edema or tenderness.  Grossly symmetric lower extremities without focal tenderness.  Neurological: She is alert and oriented to person, place, and time.  Skin: Skin is warm and dry.  Psychiatric: She has a normal mood and affect. Her behavior is normal.  Nursing note and vitals reviewed.   ED Course  Procedures (including critical care  time) Labs Review Labs Reviewed  COMPREHENSIVE METABOLIC PANEL - Abnormal; Notable for the following:    Glucose, Bld 110 (*)    GFR calc non Af Amer 81 (*)    All other components within normal limits  D-DIMER, QUANTITATIVE - Abnormal; Notable for the following:    D-Dimer, Quant 0.76 (*)    All other components within normal limits  CBC WITH DIFFERENTIAL/PLATELET  TROPONIN I  URINALYSIS, ROUTINE W REFLEX MICROSCOPIC    Imaging Review Dg Chest 2 View  02/22/2015   CLINICAL DATA:  Left chest pain and headache  EXAM: CHEST  2 VIEW  COMPARISON:  04/24/2010  FINDINGS: Mild bibasilar atelectasis. Mild cardiac enlargement without heart failure. Negative for pneumonia or effusion.  IMPRESSION: Mild bibasilar atelectasis.   Electronically Signed   By: Franchot Gallo M.D.   On: 02/22/2015 14:38     EKG Interpretation   Date/Time:  Tuesday February 22 2015 13:00:07 EDT Ventricular Rate:  65 PR Interval:  146 QRS Duration: 78 QT Interval:  418 QTC Calculation: 434 R Axis:   -17 Text Interpretation:  Normal sinus rhythm Nonspecific ST and T wave  abnormality No significant change since last tracing Confirmed by Cassell Voorhies   MD, Felicite Zeimet ([id number]) on 02/22/2015 1:06:30 PM      MDM   Final diagnoses:  Headache, unspecified headache type    2:02 PM 74 y.o. female w hx of HTN, gerd who presents with left lateral and posterior chest pain which began about one week ago. The pain is sharp and pleuritic in nature. It is worse with a deep breath. She also notes gradual onset headache at the same time. She is not able to specify when her head hurts. Vital signs unremarkable here. Low risk per [place], will get d-dimer. We'll get screening labs and imaging.  D-dimer elev. Pt has hx of facial/tongue swelling and rash w/ IV contrast. Will admit to [facility] for v/q scan. HA resolved. Will heparinize.   Pamella Pert, MD 02/22/15 [phone]

## 2015-02-23 ENCOUNTER — Encounter (HOSPITAL_COMMUNITY): Payer: Self-pay | Admitting: General Practice

## 2015-02-23 ENCOUNTER — Observation Stay (HOSPITAL_COMMUNITY): Payer: Medicare Other

## 2015-02-23 DIAGNOSIS — I10 Essential (primary) hypertension: Secondary | ICD-10-CM | POA: Diagnosis not present

## 2015-02-23 DIAGNOSIS — K219 Gastro-esophageal reflux disease without esophagitis: Secondary | ICD-10-CM | POA: Diagnosis present

## 2015-02-23 DIAGNOSIS — K210 Gastro-esophageal reflux disease with esophagitis: Secondary | ICD-10-CM

## 2015-02-23 DIAGNOSIS — R0781 Pleurodynia: Secondary | ICD-10-CM

## 2015-02-23 DIAGNOSIS — M549 Dorsalgia, unspecified: Secondary | ICD-10-CM

## 2015-02-23 DIAGNOSIS — R079 Chest pain, unspecified: Secondary | ICD-10-CM

## 2015-02-23 DIAGNOSIS — G8929 Other chronic pain: Secondary | ICD-10-CM

## 2015-02-23 LAB — CBC
HCT: 38.1 % (ref 36.0–46.0)
HEMOGLOBIN: 12.1 g/dL (ref 12.0–15.0)
MCH: 28 pg (ref 26.0–34.0)
MCHC: 31.8 g/dL (ref 30.0–36.0)
MCV: 88.2 fL (ref 78.0–100.0)
Platelets: 233 10*3/uL (ref 150–400)
RBC: 4.32 MIL/uL (ref 3.87–5.11)
RDW: 14.1 % (ref 11.5–15.5)
WBC: 7.2 10*3/uL (ref 4.0–10.5)

## 2015-02-23 LAB — HEPARIN LEVEL (UNFRACTIONATED)
HEPARIN UNFRACTIONATED: 0.33 [IU]/mL (ref 0.30–0.70)
HEPARIN UNFRACTIONATED: 0.43 [IU]/mL (ref 0.30–0.70)

## 2015-02-23 MED ORDER — PREDNISONE 50 MG PO TABS
60.0000 mg | ORAL_TABLET | Freq: Once | ORAL | Status: DC
  Filled 2015-02-23: qty 1

## 2015-02-23 MED ORDER — TECHNETIUM TC 99M DIETHYLENETRIAME-PENTAACETIC ACID: 40.0000 | Freq: Once | INTRAVENOUS | Status: AC | PRN

## 2015-02-23 MED ORDER — TECHNETIUM TO 99M ALBUMIN AGGREGATED
6.0000 | Freq: Once | INTRAVENOUS | Status: AC | PRN
  Administered 2015-02-23: 6 via INTRAVENOUS

## 2015-02-23 NOTE — Progress Notes (Signed)
*  PRELIMINARY RESULTS* Vascular Ultrasound Lower extremity venous duplex has been completed.  Preliminary findings: Negative for DVT.   Landry Mellow, RDMS, RVT  02/23/2015, 1:18 PM

## 2015-02-23 NOTE — Progress Notes (Signed)
[place] for heparin Indication: pulmonary embolus  Allergies  Allergen Reactions  . Contrast Media [Iodinated Diagnostic Agents] Swelling and Rash    Face, tongue  . Baclofen Itching  . Tinidazole   . Penicillins Rash    Patient Measurements: Height: 5\' 4"  (162.6 cm) Weight: 240 lb (108.863 kg) IBW/kg (Calculated) : 54.7 Heparin Dosing Weight: 80.6 kg  Vital Signs: Temp: 98 F (36.7 C) (04/20 0743) Temp Source: Oral (04/20 0743) BP: 112/54 mmHg (04/20 0743) Pulse Rate: 63 (04/20 0743)  Labs:  Recent Labs  02/22/15 1330 02/23/15 0312 02/23/15 1251  HGB 12.6 12.1  --   HCT 39.7 38.1  --   PLT 219 233  --   HEPARINUNFRC  --  0.33 0.43  CREATININE 0.76  --   --   TROPONINI <0.03  --   --     Estimated Creatinine Clearance: 74.4 mL/min (by C-G formula based on Cr of 0.76).  Assessment: 73 yo Female with possible PE for heparin. Doppler negative for DVT. Awaiting VQ scan (no issues with contrast allergy and this test). Heparin level therapeutic x2 on 1300 units/hr. CBC wnl- hgb 12.1, plts 233- no bleeding noted.  Goal of Therapy:  Heparin level 0.3-0.7 units/ml Monitor platelets by anticoagulation protocol: Yes   Plan:  -Continue Heparin at 1300 units/hr -daily HL and CBC -F/U VQ scan results -follow transition to PO anticoagulation   Faron Whitelock D. Lian Pounds, PharmD, BCPS Clinical Pharmacist Pager: [phone] 02/23/2015 2:19 PM

## 2015-02-23 NOTE — Progress Notes (Signed)
[place] for heparin Indication: pulmonary embolus  Allergies  Allergen Reactions  . Contrast Media [Iodinated Diagnostic Agents] Swelling and Rash    Face, tongue  . Baclofen Itching  . Tinidazole   . Penicillins Rash    Patient Measurements: Height: 5\' 4"  (162.6 cm) Weight: 240 lb (108.863 kg) IBW/kg (Calculated) : 54.7 Heparin Dosing Weight: 80.6 kg  Vital Signs: BP: 138/72 mmHg (04/19 1842) Pulse Rate: 66 (04/19 1842)  Labs:  Recent Labs  02/22/15 1330 02/23/15 0312  HGB 12.6 12.1  HCT 39.7 38.1  PLT 219 233  HEPARINUNFRC  --  0.33  CREATININE 0.76  --   TROPONINI <0.03  --     Estimated Creatinine Clearance: 74.4 mL/min (by C-G formula based on Cr of 0.76).  Assessment: 74 yo Female with possible PE for heparin   Goal of Therapy:  Heparin level 0.3-0.7 units/ml Monitor platelets by anticoagulation protocol: Yes   Plan:  Continue Heparin at current rate Recheck level later this morning to verify F/U VQ scan results  Phillis Knack, PharmD, BCPS

## 2015-02-23 NOTE — Progress Notes (Addendum)
[facility]                                                                              Patient Demographics  Wendy Carrillo, is a 74 y.o. female, DOB - [birth year], PJA:[id number]  Admit date - 02/22/2015   Admitting Physician Geradine Girt, DO  Outpatient Primary MD for the patient is Jilda Panda, MD  LOS -    Chief Complaint  Patient presents with  . Chest Pain       Brief HPI   Patient is a 74 y.o. female with gastritis, hypertension, chronic arthritis presented with left sided, lateral, chest pain. Patient described the pain as pleuritic, worse with deep breaths, and had been ongoing for the past couple of days. Sharp in quality. Patient had a recent car drive trip to [place] in the past couple of weeks. 3 days ago, tried to admission, she developed left groin pain which then eased off as her chest pain started shortly there after she states. D.Dimer was positive in ED, patient could not get CTA chest due to contrast allergy so she has been put on empiric heparin gtt and transferred to [facility] for VQ scan.    Assessment & Plan    Principal Problem:  Pleuritic atypical Chest pain - Troponins 2 negative, EKG rate 65, no ST-T wave changes suggestive of ischemia - D-dimer 0.76, VQ scan pending - Continue heparin drip -  Patient also appears to have chest wall tenderness on the left side, ordered acute rib series. If PE negative and left rib series is negative, will try steroids if it would help with the chest pain  - Doppler ultrasound of the lower extremity negative for DVT - Rib series negative for acute rib fracture  Addendum:  VQ scan neg for pulmonary embolism, will DC heparin drip Pain did improve with pain medications, will give 1 dose of prednisone 60mg  for possible costochondritis and reassess in a.m.   Active Problems:   GERD (gastroesophageal reflux disease) - Continue PPI    Essential hypertension - Continue ramipril    Chronic back  pain - Continue Norco, Celebrex, Flexeril  Code Status: full code  Family Communication: Discussed in detail with the patient, all imaging results, lab results explained to the patient and husband   Disposition Plan: Hopefully tomorrow   Time Spent in minutes   25 minutes  Procedures  VQ scan pending   Consults   none  DVT Prophylaxis  heparin   Medications  Scheduled Meds: . loratadine  10 mg Oral Daily  . pantoprazole  40 mg Oral Daily  . ramipril  2.5 mg Oral Daily   Continuous Infusions: . heparin 1,300 Units/hr (02/23/15 0630)   PRN Meds:.ALPRAZolam, celecoxib, cyclobenzaprine, gabapentin, HYDROcodone-acetaminophen, technetium TC 25M diethylenetriame-pentaacetic acid   Antibiotics   Anti-infectives    None        Subjective:   Manasa Spease was seen and examined today. Patient denies dizziness,  shortness of breath, abdominal pain, N/V/D/C, new weakness, numbess, tingling. Patient reports intermittent episodes of pleuritic chest pain overnight. Also appears to have chest wall tenderness on palpation. No fevers or chills.  Objective:   Blood pressure 112/54, pulse 63, temperature 98 F (36.7 C), temperature source Oral, resp. rate 20, height 5\' 4"  (1.626 m), weight 108.863 kg (240 lb), SpO2 96 %.  Wt Readings from Last 3 Encounters:  02/22/15 108.863 kg (240 lb)  10/25/14 109.77 kg (242 lb)     Intake/Output Summary (Last 24 hours) at 02/23/15 1509 Last data filed at 02/22/15 2250  Gross per 24 hour  Intake 447.31 ml  Output    300 ml  Net 147.31 ml    Exam  General: Alert and oriented x 3, NAD  HEENT:  PERRLA, EOMI, Anicteic Sclera, mucous membranes moist.   Neck: Supple, no JVD, no masses  CVS: S1 S2 auscultated, no rubs, murmurs or gallops. Regular rate and rhythm.  Respiratory: Clear to auscultation bilaterally, no wheezing, rales or rhonchi. Left-sided chest wall tenderness to  palpation.   Abdomen: Soft, nontender, nondistended,  + bowel sounds  Ext: no cyanosis clubbing or edema  Neuro: AAOx3, Cr N's II- XII. Strength 5/5 upper and lower extremities bilaterally  Skin: No rashes  Psych: Normal affect and demeanor, alert and oriented x3    Data Review   Micro Results No results found for this or any previous visit (from the past 240 hour(s)).  Radiology Reports Dg Chest 2 View  02/22/2015   CLINICAL DATA:  Left chest pain and headache  EXAM: CHEST  2 VIEW  COMPARISON:  04/24/2010  FINDINGS: Mild bibasilar atelectasis. Mild cardiac enlargement without heart failure. Negative for pneumonia or effusion.  IMPRESSION: Mild bibasilar atelectasis.   Electronically Signed   By: Franchot Gallo M.D.   On: 02/22/2015 14:38   Dg Ribs Unilateral Left  02/23/2015   CLINICAL DATA:  Left lower posterior rib pain.  No known injury.  EXAM: LEFT RIBS - 2 VIEW  COMPARISON:  Chest x-ray 02/22/2015.  Chest x-ray 04/24/2010.  FINDINGS: No focal rib lesion or rib fracture noted. There are sclerotic foci with in the humeral head and neck. When comparing to prior chest x-ray from 2011. One of these areas was imaged and was present, and appears not significantly changed.  Visualized lungs are clear.  IMPRESSION: No focal rib lesion or fracture.  Sclerotic areas within the proximal left humerus, 1 of which is stable since 2011. I favor a benign process. However, if there is history of cancer, bone scan may be beneficial to further evaluate and assess for additional areas.   Electronically Signed   By: Rolm Baptise M.D.   On: 02/23/2015 11:55    CBC  Recent Labs Lab 02/22/15 1330 02/23/15 0312  WBC 6.0 7.2  HGB 12.6 12.1  HCT 39.7 38.1  PLT 219 233  MCV 89.2 88.2  MCH 28.3 28.0  MCHC 31.7 31.8  RDW 13.7 14.1  LYMPHSABS 1.7  --   MONOABS 0.6  --   EOSABS 0.2  --   BASOSABS 0.0  --     Chemistries   Recent Labs Lab 02/22/15 1330  NA 139  K 4.3  CL 104  CO2 30  GLUCOSE 110*  BUN 12  CREATININE 0.76  CALCIUM 8.8  AST  14  ALT 12  ALKPHOS 52  BILITOT 0.5   ------------------------------------------------------------------------------------------------------------------ estimated creatinine clearance is 74.4 mL/min (by C-G formula based on Cr of 0.76). ------------------------------------------------------------------------------------------------------------------ No results for input(s): HGBA1C in the last 72 hours. ------------------------------------------------------------------------------------------------------------------ No results for input(s): CHOL, HDL, LDLCALC, TRIG, CHOLHDL, LDLDIRECT in the last 72 hours. ------------------------------------------------------------------------------------------------------------------ No results for  input(s): TSH, T4TOTAL, T3FREE, THYROIDAB in the last 72 hours.  Invalid input(s): FREET3 ------------------------------------------------------------------------------------------------------------------ No results for input(s): VITAMINB12, FOLATE, FERRITIN, TIBC, IRON, RETICCTPCT in the last 72 hours.  Coagulation profile No results for input(s): INR, PROTIME in the last 168 hours.   Recent Labs  02/22/15 1330  DDIMER 0.76*    Cardiac Enzymes  Recent Labs Lab 02/22/15 1330  TROPONINI <0.03   ------------------------------------------------------------------------------------------------------------------ Invalid input(s): POCBNP  No results for input(s): GLUCAP in the last 72 hours.   RAI,RIPUDEEP M.D. [facility] 02/23/2015, 3:09 PM  Pager: [phone]   Between 7am to 7pm - call Pager - [phone]  After 7pm go to [email or web address] - password [email or web address]  Call night coverage person covering after 7pm

## 2015-02-23 NOTE — Progress Notes (Signed)
UR completed 

## 2015-02-24 DIAGNOSIS — I10 Essential (primary) hypertension: Secondary | ICD-10-CM | POA: Diagnosis not present

## 2015-02-24 DIAGNOSIS — R0781 Pleurodynia: Secondary | ICD-10-CM | POA: Diagnosis not present

## 2015-02-24 DIAGNOSIS — M549 Dorsalgia, unspecified: Secondary | ICD-10-CM | POA: Diagnosis not present

## 2015-02-24 DIAGNOSIS — K210 Gastro-esophageal reflux disease with esophagitis: Secondary | ICD-10-CM | POA: Diagnosis not present

## 2015-02-24 LAB — CBC
HCT: 39.3 % (ref 36.0–46.0)
Hemoglobin: 12.5 g/dL (ref 12.0–15.0)
MCH: 28.3 pg (ref 26.0–34.0)
MCHC: 31.8 g/dL (ref 30.0–36.0)
MCV: 89.1 fL (ref 78.0–100.0)
Platelets: 232 10*3/uL (ref 150–400)
RBC: 4.41 MIL/uL (ref 3.87–5.11)
RDW: 14.3 % (ref 11.5–15.5)
WBC: 6.6 10*3/uL (ref 4.0–10.5)

## 2015-02-24 LAB — HEPARIN LEVEL (UNFRACTIONATED): Heparin Unfractionated: <0.1 IU/mL — ABNORMAL LOW (ref 0.30–0.70)

## 2015-02-24 MED ORDER — ALPRAZOLAM 0.25 MG PO TABS: 0.2500 mg | ORAL_TABLET | Freq: Every day | ORAL | Status: DC | PRN

## 2015-02-24 MED ORDER — PREDNISONE 10 MG PO TABS: ORAL_TABLET | ORAL | Status: DC

## 2015-02-24 MED ORDER — SIMETHICONE 80 MG PO CHEW: 80.0000 mg | CHEWABLE_TABLET | Freq: Four times a day (QID) | ORAL | Status: DC | PRN

## 2015-02-24 MED ORDER — HYDROCODONE-ACETAMINOPHEN 5-325 MG PO TABS: 1.0000 | ORAL_TABLET | Freq: Four times a day (QID) | ORAL | Status: DC | PRN

## 2015-02-24 MED ORDER — PREDNISONE 20 MG PO TABS
40.0000 mg | ORAL_TABLET | Freq: Every day | ORAL | Status: DC
  Filled 2015-02-24 (×2): qty 2

## 2015-02-24 MED ORDER — SIMETHICONE 80 MG PO CHEW
80.0000 mg | CHEWABLE_TABLET | Freq: Four times a day (QID) | ORAL | Status: DC
Start: 2015-02-24 — End: 2015-02-24
  Filled 2015-02-24 (×4): qty 1

## 2015-02-24 NOTE — Discharge Summary (Signed)
Physician Discharge Summary   Patient ID: Wendy Carrillo MRN: [id number] DOB/AGE: [birth year] 74 y.o.  Admit date: 02/22/2015 Discharge date: 02/24/2015  Primary Care Physician:  Jilda Panda, MD  Discharge Diagnoses:    . Chest pain atypical likely costochondritis  . GERD (gastroesophageal reflux disease) . Essential hypertension . Chronic back pain  Consults: None   Recommendations for Outpatient Follow-up:  Please follow BMET, CBC at the time of follow-up appointment  Patient was given prednisone taper for costochondritis   DIET: Heart healthy diet    Allergies:   Allergies  Allergen Reactions  . Contrast Media [Iodinated Diagnostic Agents] Swelling and Rash    Face, tongue  . Baclofen Itching  . Tinidazole   . Penicillins Rash     Discharge Medications:   Medication List    TAKE these medications        ALPRAZolam 0.25 MG tablet  Commonly known as:  XANAX  Take 1 tablet (0.25 mg total) by mouth daily as needed.     celecoxib 200 MG capsule  Commonly known as:  CELEBREX  Take 200 mg by mouth daily as needed for mild pain.     cetirizine 10 MG tablet  Commonly known as:  ZYRTEC  Take 10 mg by mouth daily as needed for allergies.     cyclobenzaprine 10 MG tablet  Commonly known as:  FLEXERIL  Take 10 mg by mouth at bedtime as needed. For muscle spasms     furosemide 20 MG tablet  Commonly known as:  LASIX  Take 20 mg by mouth daily as needed.     gabapentin 300 MG capsule  Commonly known as:  NEURONTIN  Take 300 mg by mouth daily as needed (for pain).     HYDROcodone-acetaminophen 5-325 MG per tablet  Commonly known as:  NORCO/VICODIN  Take 1 tablet by mouth every 6 (six) hours as needed for moderate pain or severe pain.     omeprazole-sodium bicarbonate 40-1100 MG per capsule  Commonly known as:  ZEGERID  Take 1 capsule by mouth every morning.     predniSONE 10 MG tablet  Commonly known as:  DELTASONE  Prednisone dosing: Take   Prednisone 40mg  (4 tabs) x 1 day, then taper to 30mg  (3 tabs) x 2 days, then 20mg  (2 tabs) x 2days, then 10mg  (1 tab) x 2days, then OFF.  Start taking on:  02/25/2015     ramipril 2.5 MG capsule  Commonly known as:  ALTACE  Take 2.5 mg by mouth daily.     simethicone 80 MG chewable tablet  Commonly known as:  MYLICON  Chew 1 tablet (80 mg total) by mouth 4 (four) times daily as needed for flatulence.     traMADol 200 MG 24 hr tablet  Commonly known as:  ULTRAM-ER  Take 200 mg by mouth daily as needed for pain.     Vitamin D (Ergocalciferol) 50000 UNITS Caps capsule  Commonly known as:  DRISDOL  Take 50,000 Units by mouth once a week.         Brief H and P: For complete details please refer to admission H and P, but in brief  Patient is a 74 y.o. female with gastritis, hypertension, chronic arthritis presented with left sided, lateral, chest pain. Patient described the pain as pleuritic, worse with deep breaths, and had been ongoing for the past couple of days. Sharp in quality. Patient had a recent car drive trip to [place] in the past couple of weeks. 3 days  ago, tried to admission, she developed left groin pain which then eased off as her chest pain started shortly there after she states. D.Dimer was positive in ED, patient could not get CTA chest due to contrast allergy so she has been put on empiric heparin gtt and transferred to [facility] for VQ scan.  Hospital Course:  Pleuritic atypical Chest pain Patient was admitted to telemetry, ruled out for acute ACS. Troponins were negative. EKG rate 65, no ST-T wave changes suggestive of ischemia.  D-dimer 0.76, hence VQ scan was done which was negative/low probability for pulmonary embolism. Patient was initially started on heparin drip which was discontinued after a negative VQ scan. Patient's chest pain improved with the pain medications.  Patient also appeared to have chest wall tenderness on the left side, acute rate series were done  which were negative for acute fracture.  Doppler ultrasound of the lower extremity negative for DVT Patient's chest pain did improve with the medications, prednisone given for costochondritis. Given that patient has history of GERD, she was required to continue PPI with short taper dose of prednisone for costochondritis.   GERD (gastroesophageal reflux disease) - Continue PPI   Essential hypertension - Continue ramipril   Chronic back pain - Continue Norco, Celebrex, Flexeril    Day of Discharge BP 134/67 mmHg  Pulse 68  Temp(Src) 98.2 F (36.8 C) (Oral)  Resp 18  Ht 5\' 4"  (1.626 m)  Wt 108.863 kg (240 lb)  BMI 41.18 kg/m2  SpO2 97%  Physical Exam: General: Alert and awake oriented x3 not in any acute distress. HEENT: anicteric sclera, pupils reactive to light and accommodation CVS: S1-S2 clear no murmur rubs or gallops Chest: clear to auscultation bilaterally, no wheezing rales or rhonchi Abdomen: soft nontender, nondistended, normal bowel sounds Extremities: no cyanosis, clubbing or edema noted bilaterally Neuro: Cranial nerves II-XII intact, no focal neurological deficits   The results of significant diagnostics from this hospitalization (including imaging, microbiology, ancillary and laboratory) are listed below for reference.    LAB RESULTS: Basic Metabolic Panel:  Recent Labs Lab 02/22/15 1330  NA 139  K 4.3  CL 104  CO2 30  GLUCOSE 110*  BUN 12  CREATININE 0.76  CALCIUM 8.8   Liver Function Tests:  Recent Labs Lab 02/22/15 1330  AST 14  ALT 12  ALKPHOS 52  BILITOT 0.5  PROT 6.5  ALBUMIN 3.6   No results for input(s): LIPASE, AMYLASE in the last 168 hours. No results for input(s): AMMONIA in the last 168 hours. CBC:  Recent Labs Lab 02/22/15 1330 02/23/15 0312 02/24/15 0449  WBC 6.0 7.2 6.6  NEUTROABS 3.6  --   --   HGB 12.6 12.1 12.5  HCT 39.7 38.1 39.3  MCV 89.2 88.2 89.1  PLT 219 233 232   Cardiac Enzymes:  Recent Labs Lab  02/22/15 1330  TROPONINI <0.03   BNP: Invalid input(s): POCBNP CBG: No results for input(s): GLUCAP in the last 168 hours.  Significant Diagnostic Studies:  Dg Chest 2 View  02/22/2015   CLINICAL DATA:  Left chest pain and headache  EXAM: CHEST  2 VIEW  COMPARISON:  04/24/2010  FINDINGS: Mild bibasilar atelectasis. Mild cardiac enlargement without heart failure. Negative for pneumonia or effusion.  IMPRESSION: Mild bibasilar atelectasis.   Electronically Signed   By: Franchot Gallo M.D.   On: 02/22/2015 14:38   Dg Ribs Unilateral Left  02/23/2015   CLINICAL DATA:  Left lower posterior rib pain.  No known injury.  EXAM: LEFT RIBS - 2 VIEW  COMPARISON:  Chest x-ray 02/22/2015.  Chest x-ray 04/24/2010.  FINDINGS: No focal rib lesion or rib fracture noted. There are sclerotic foci with in the humeral head and neck. When comparing to prior chest x-ray from 2011. One of these areas was imaged and was present, and appears not significantly changed.  Visualized lungs are clear.  IMPRESSION: No focal rib lesion or fracture.  Sclerotic areas within the proximal left humerus, 1 of which is stable since 2011. I favor a benign process. However, if there is history of cancer, bone scan may be beneficial to further evaluate and assess for additional areas.   Electronically Signed   By: Rolm Baptise M.D.   On: 02/23/2015 11:55   Nm Pulmonary Perf And Vent  02/23/2015   CLINICAL DATA:  Chest pain, positive D-dimer  EXAM: NUCLEAR MEDICINE VENTILATION - PERFUSION LUNG SCAN  TECHNIQUE: Ventilation images were obtained in multiple projections using inhaled aerosol technetium 99 M DTPA. Perfusion images were obtained in multiple projections after intravenous injection of Tc-21m MAA.  RADIOPHARMACEUTICALS:  Forty mCi Tc-70m DTPA aerosol and 6 mCi Tc-66m MAA  COMPARISON:  None.  FINDINGS: Ventilation: No focal ventilation defect.  Perfusion: No wedge shaped peripheral perfusion defects to suggest acute pulmonary embolism.   IMPRESSION: No ventilation-perfusion mismatch to suggest pulmonary embolism is identified.   Electronically Signed   By: Inez Catalina M.D.   On: 02/23/2015 16:22       Disposition and Follow-up:     Discharge Instructions    Diet - low sodium heart healthy    Complete by:  As directed      Increase activity slowly    Complete by:  As directed             DISPOSITION: Home   DISCHARGE FOLLOW-UP Follow-up Information    Follow up with Jilda Panda, MD. Schedule an appointment as soon as possible for a visit in 10 days.   Specialty:  Internal Medicine   Why:  for hospital follow-up   Contact information:   [id number] [place] [address] [phone]        Time spent on Discharge: 33 mins  Signed:   Jamarrion Budai M.D. [facility] 02/24/2015, 11:13 AM Pager: [phone]

## 2015-02-24 NOTE — Progress Notes (Signed)
Discharge education completed by RN. Pt and spouse received a copy of discharge paperwork and confirm understanding of follow up appointments and discharge medications. Both deny any questions at this time. IV removed, site is within normal limits. Pt will discharge from the unit via wheelchair. 

## 2015-04-20 ENCOUNTER — Ambulatory Visit (INDEPENDENT_AMBULATORY_CARE_PROVIDER_SITE_OTHER): Payer: Medicare Other | Admitting: Cardiology

## 2015-04-20 ENCOUNTER — Encounter: Payer: Self-pay | Admitting: Cardiology

## 2015-04-20 VITALS — BP 130/80 | HR 68 | Ht 64.0 in | Wt 241.4 lb

## 2015-04-20 DIAGNOSIS — R079 Chest pain, unspecified: Secondary | ICD-10-CM

## 2015-04-20 DIAGNOSIS — I10 Essential (primary) hypertension: Secondary | ICD-10-CM | POA: Diagnosis not present

## 2015-04-20 DIAGNOSIS — R0600 Dyspnea, unspecified: Secondary | ICD-10-CM

## 2015-04-20 DIAGNOSIS — R0789 Other chest pain: Secondary | ICD-10-CM | POA: Insufficient documentation

## 2015-04-20 DIAGNOSIS — I209 Angina pectoris, unspecified: Secondary | ICD-10-CM | POA: Insufficient documentation

## 2015-04-20 NOTE — Progress Notes (Signed)
Cardiology Office Note   Date:  04/20/2015   ID:  Wendy Carrillo, DOB October 30, 1941, MRN [id number]  PCP:  Jilda Panda, MD  Cardiologist:   Candee Furbish, MD       History of Present Illness: Wendy Carrillo is a 74 y.o. female who presents for evaluation of chest pain. She was hospitalized on 02/22/15 with left-sided lateral chest pain that was pleuritic worse with deep breathing and had been ongoing for the past few days. It was sharp in quality. She recent trip to [place]. D-dimer was elevated but patient could not get a CTA of chest due to allergy so she had a VQ scan and lower external Dopplers which were reassuring.  Troponin was normal. Hemoglobin 12.5, creatinine 0.7.  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 Her daughter works for [employer]. She has been noticing some increasing dyspnea on exertion, when walking. Sometimes when traveling up the stairs at church she has to stop at the top, feels as though she has no energy.  She still continues to have occasional left-sided chest discomfort, points to her left flank region. Midaxillary line. Sharp. Sometimes has a sensation like she needs to take in a deep breath.  [place] heart [place] 20 years ago. It was normal.     Past Medical History  Diagnosis Date  . Gastritis   . Hypertension   . GERD (gastroesophageal reflux disease)   . Family history of adverse reaction to anesthesia     "one of my son's passed out"  . History of blood transfusion 1992    "related to knee replacement"  . 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 History of duodenal ulcer   . Arthritis     "all over"  . Chronic lower back pain   . Spinal stenosis, lumbar     Past Surgical History  Procedure Laterality Date  . Replacement total knee Bilateral     "left in 1992; right in 2001"  . Bladder suspension    . Carpal tunnel  release Bilateral 2010's  . Joint replacement    . Cholecystectomy open  1980's  . Appendectomy  ?1980's  . Vaginal hysterectomy  1972  . Dilation and curettage of uterus  "several"  . Breast cyst excision Bilateral   . Breast surgery Left     "nipple taken off"  . Reduction mammaplasty Bilateral   . Cardiac catheterization    . Hernia repair    . Abdominal hernia repair    . Patella fracture surgery Left "after 1992"    "after the replacement"  . Cataract extraction w/ intraocular lens implant Left 2014  . Anterior and posterior vaginal repair  2015     Current Outpatient Prescriptions  Medication Sig Dispense Refill  . ALPRAZolam (XANAX) 0.25 MG tablet Take 1 tablet (0.25 mg total) by mouth daily as needed. 30 tablet 0  . celecoxib (CELEBREX) 200 MG capsule Take 200 mg by mouth daily as needed for mild pain.     . cetirizine (ZYRTEC) 10 MG tablet Take 10 mg by mouth daily as needed for allergies.   5  . cyclobenzaprine (FLEXERIL) 10 MG tablet Take 10 mg by mouth at bedtime as needed. For muscle spasms    . furosemide (LASIX) 20 MG tablet Take 20 mg by mouth daily as needed.    . gabapentin (  NEURONTIN) 300 MG capsule Take 300 mg by mouth daily as needed (for pain).     Marland Kitchen HYDROcodone-acetaminophen (NORCO/VICODIN) 5-325 MG per tablet Take 1 tablet by mouth every 6 (six) hours as needed for moderate pain or severe pain. 20 tablet 0  . omeprazole-sodium bicarbonate (ZEGERID) 40-1100 MG per capsule Take 1 capsule by mouth every morning.  11  . ramipril (ALTACE) 2.5 MG capsule Take 2.5 mg by mouth daily.  5  . traMADol (ULTRAM-ER) 200 MG 24 hr tablet Take 200 mg by mouth daily as needed for pain.   0  . Vitamin D, Ergocalciferol, (DRISDOL) 50000 UNITS CAPS capsule Take 50,000 Units by mouth once a week.  5   No current facility-administered medications for this visit.    Allergies:   Contrast media; Tinidazole; Baclofen; and Penicillins    Social History:  The patient  reports that  she has never smoked. She has never used smokeless tobacco. She reports that she does not drink alcohol or use illicit drugs.   Family History:  The patient's family history includes Heart attack in her father; Hypertension in her mother. Brother had MI and father MI. 69's.   ROS:  Please see the history of present illness.   Otherwise, review of systems are positive for knee pain, arthralgias, shortness of breath, sleeps on one to 2 pillows.   All other systems are reviewed and negative.    PHYSICAL EXAM: VS:  BP 130/80 mmHg  Pulse 68  Ht 5\' 4"  (1.626 m)  Wt 241 lb 6.4 oz (109.498 kg)  BMI 41.42 kg/m2 , BMI Body mass index is 41.42 kg/(m^2). GEN: Well nourished, well developed, in no acute distress HEENT: normal Neck: no JVD, carotid bruits, or masses Cardiac: RRR; no murmurs, rubs, or gallops,no edema  Respiratory:  clear to auscultation bilaterally, normal work of breathing GI: soft, nontender, nondistended, + BS, overweight MS: no deformity or atrophy Skin: warm and dry, no rash, trace edema Neuro:  Strength and sensation are intact Psych: euthymic mood, full affect   EKG:  EKG is ordered today. The ekg ordered today demonstrates 04/20/15-sinus rhythm, 68 with nonspecific ST-T wave changes, T-wave inversion in inferior leads, flattening lateral leads. Left axis deviation noted. Prior EKG from 02/22/15 reviewed, no change. Personally reviewed.   Recent Labs: 02/22/2015: ALT 12; BUN 12; Creatinine, Ser 0.76; Potassium 4.3; Sodium 139 02/24/2015: Hemoglobin 12.5; Platelets 232    Lipid Panel No results found for: CHOL, TRIG, HDL, CHOLHDL, VLDL, LDLCALC, LDLDIRECT    Wt Readings from Last 3 Encounters:  04/20/15 241 lb 6.4 oz (109.498 kg)  02/22/15 240 lb (108.863 kg)  10/25/14 242 lb (109.77 kg)      Other studies Reviewed: Additional studies/ records that were reviewed today include: Prior lab work, scans, EKG, hospital records reviewed. Review of the above records  demonstrates: As above   ASSESSMENT AND PLAN:  1.  Chest pain-fairly atypical chest pain-likely musculoskeletal at times, agree with previous discharge summary in April. Nonetheless, age, family history, other risk factors. We will proceed with pharmacologic stress test. She states that many years ago she had one of these.  2. Dyspnea-VQ scan, chest x-ray, heart rhythm reassuring. Checking echocardiogram to ensure proper structure and function of her heart. Continue to encourage conditioning, exercises. She is quite active with her church, [employer] business. Continue with walking, exercise. Continue with good blood pressure control.  3. Morbid obesity-BMI 41. Continue to encourage weight loss. Certainly this is so playing  a role in her dyspnea on exertion.  4. Essential hypertension-currently well controlled. Continue with ACE inhibitor.  5. Lower extremity edema-periodic, Lasix low-dose for many years. As needed.   Current medicines are reviewed at length with the patient today.  The patient does not have concerns regarding medicines.  The following changes have been made:  no change  Labs/ tests ordered today include: Echocardiogram, stress test  No orders of the defined types were placed in this encounter.     Disposition:  We will follow-up with results of testing  Signed, Candee Furbish, MD  04/20/2015 10:54 AM    [address] [address] [place], [place], [place]  [address] Phone: [phone]; Fax: [phone]

## 2015-04-20 NOTE — Patient Instructions (Signed)
Medication Instructions:  Your physician recommends that you continue on your current medications as directed. Please refer to the Current Medication list given to you today.  Testing/Procedures: Your physician has requested that you have an echocardiogram. Echocardiography is a painless test that uses sound waves to create images of your heart. It provides your doctor with information about the size and shape of your heart and how well your heart's chambers and valves are working. This procedure takes approximately one hour. There are no restrictions for this procedure.  Your physician has requested that you have a lexiscan myoview. For further information please visit [email or web address]. Please follow instruction sheet, as given.  Follow-Up: Further follow up will be based on the results of your testing.  Thank you for choosing [facility]!!

## 2015-05-03 ENCOUNTER — Telehealth (HOSPITAL_COMMUNITY): Payer: Self-pay

## 2015-05-03 NOTE — Telephone Encounter (Signed)
Patient given detailed instructions per Myocardial Perfusion Study Information Sheet for test on 05-05-2015 at 11:30am., arrive at 10:15am for Echo. Patient Notified to arrive 15 minutes early, and that it is imperative to arrive on time for appointment to keep from having the test rescheduled. Patient verbalized understanding. Wendy Carrillo, [name]

## 2015-05-03 NOTE — Telephone Encounter (Signed)
Left message on voicemail in reference to upcoming appointment scheduled for 05-05-2015. Phone number given for a call back so details instructions can be given.Oletta Lamas, Oshae Simmering A

## 2015-05-05 ENCOUNTER — Ambulatory Visit (HOSPITAL_COMMUNITY): Payer: Medicare Other | Attending: Cardiology

## 2015-05-05 ENCOUNTER — Ambulatory Visit (HOSPITAL_BASED_OUTPATIENT_CLINIC_OR_DEPARTMENT_OTHER): Payer: Medicare Other

## 2015-05-05 ENCOUNTER — Other Ambulatory Visit: Payer: Self-pay

## 2015-05-05 DIAGNOSIS — I358 Other nonrheumatic aortic valve disorders: Secondary | ICD-10-CM | POA: Diagnosis not present

## 2015-05-05 DIAGNOSIS — R079 Chest pain, unspecified: Secondary | ICD-10-CM | POA: Diagnosis not present

## 2015-05-05 DIAGNOSIS — I340 Nonrheumatic mitral (valve) insufficiency: Secondary | ICD-10-CM | POA: Insufficient documentation

## 2015-05-05 DIAGNOSIS — I10 Essential (primary) hypertension: Secondary | ICD-10-CM

## 2015-05-05 MED ORDER — REGADENOSON 0.4 MG/5ML IV SOLN
0.4000 mg | Freq: Once | INTRAVENOUS | Status: AC
  Administered 2015-05-05: 0.4 mg via INTRAVENOUS

## 2015-05-05 MED ORDER — TECHNETIUM TC 99M SESTAMIBI GENERIC - CARDIOLITE
32.0000 | Freq: Once | INTRAVENOUS | Status: AC | PRN
  Administered 2015-05-05: 32 via INTRAVENOUS

## 2015-05-06 ENCOUNTER — Ambulatory Visit (HOSPITAL_COMMUNITY): Payer: Medicare Other | Attending: Internal Medicine

## 2015-05-06 LAB — MYOCARDIAL PERFUSION IMAGING
CHL CUP NUCLEAR SDS: 2
CHL CUP NUCLEAR SRS: 1
CSEPPHR: 86 {beats}/min
LV dias vol: 115 mL
LV sys vol: 56 mL
NUC STRESS TID: 1
RATE: 0.32
Rest HR: 62 {beats}/min
SSS: 3

## 2015-05-06 MED ORDER — TECHNETIUM TC 99M SESTAMIBI GENERIC - CARDIOLITE
33.0000 | Freq: Once | INTRAVENOUS | Status: AC | PRN
  Administered 2015-05-06: 33 via INTRAVENOUS

## 2015-05-13 ENCOUNTER — Telehealth: Payer: Self-pay | Admitting: Cardiology

## 2015-05-13 NOTE — Telephone Encounter (Signed)
New message      Returning Wendy Carrillo's call

## 2015-05-13 NOTE — Telephone Encounter (Signed)
Reviewed results of testing with pt who stated understanding.

## 2015-06-08 ENCOUNTER — Other Ambulatory Visit: Payer: Self-pay | Admitting: Internal Medicine

## 2015-06-17 ENCOUNTER — Ambulatory Visit: Payer: Medicare Other | Admitting: Cardiology

## 2015-11-07 ENCOUNTER — Emergency Department (HOSPITAL_BASED_OUTPATIENT_CLINIC_OR_DEPARTMENT_OTHER): Payer: Medicare Other

## 2015-11-07 ENCOUNTER — Encounter (HOSPITAL_BASED_OUTPATIENT_CLINIC_OR_DEPARTMENT_OTHER): Payer: Self-pay | Admitting: *Deleted

## 2015-11-07 ENCOUNTER — Emergency Department (HOSPITAL_BASED_OUTPATIENT_CLINIC_OR_DEPARTMENT_OTHER)
Admission: EM | Admit: 2015-11-07 | Discharge: 2015-11-07 | Disposition: A | Discharge status: Home / Self Care | Payer: Medicare Other | Attending: Emergency Medicine | Admitting: Emergency Medicine

## 2015-11-07 DIAGNOSIS — Z880 Allergy status to penicillin: Secondary | ICD-10-CM | POA: Diagnosis not present

## 2015-11-07 DIAGNOSIS — R110 Nausea: Secondary | ICD-10-CM | POA: Insufficient documentation

## 2015-11-07 DIAGNOSIS — R1012 Left upper quadrant pain: Secondary | ICD-10-CM | POA: Insufficient documentation

## 2015-11-07 DIAGNOSIS — I10 Essential (primary) hypertension: Secondary | ICD-10-CM | POA: Insufficient documentation

## 2015-11-07 DIAGNOSIS — Z8619 Personal history of other infectious and parasitic diseases: Secondary | ICD-10-CM | POA: Diagnosis not present

## 2015-11-07 DIAGNOSIS — Z79899 Other long term (current) drug therapy: Secondary | ICD-10-CM | POA: Insufficient documentation

## 2015-11-07 DIAGNOSIS — M1990 Unspecified osteoarthritis, unspecified site: Secondary | ICD-10-CM | POA: Diagnosis not present

## 2015-11-07 DIAGNOSIS — M545 Low back pain, unspecified: Secondary | ICD-10-CM

## 2015-11-07 DIAGNOSIS — G8929 Other chronic pain: Secondary | ICD-10-CM | POA: Diagnosis not present

## 2015-11-07 DIAGNOSIS — K219 Gastro-esophageal reflux disease without esophagitis: Secondary | ICD-10-CM | POA: Insufficient documentation

## 2015-11-07 DIAGNOSIS — R630 Anorexia: Secondary | ICD-10-CM | POA: Insufficient documentation

## 2015-11-07 DIAGNOSIS — R109 Unspecified abdominal pain: Secondary | ICD-10-CM | POA: Diagnosis present

## 2015-11-07 DIAGNOSIS — R350 Frequency of micturition: Secondary | ICD-10-CM | POA: Diagnosis not present

## 2015-11-07 DIAGNOSIS — Z9049 Acquired absence of other specified parts of digestive tract: Secondary | ICD-10-CM | POA: Insufficient documentation

## 2015-11-07 DIAGNOSIS — R1011 Right upper quadrant pain: Secondary | ICD-10-CM | POA: Insufficient documentation

## 2015-11-07 DIAGNOSIS — R1013 Epigastric pain: Secondary | ICD-10-CM | POA: Insufficient documentation

## 2015-11-07 LAB — COMPREHENSIVE METABOLIC PANEL
ALBUMIN: 3.7 g/dL (ref 3.5–5.0)
ALT: 11 U/L — AB (ref 14–54)
AST: 17 U/L (ref 15–41)
Alkaline Phosphatase: 50 U/L (ref 38–126)
Anion gap: 3 — ABNORMAL LOW (ref 5–15)
BUN: 9 mg/dL (ref 6–20)
CALCIUM: 8.5 mg/dL — AB (ref 8.9–10.3)
CO2: 29 mmol/L (ref 22–32)
CREATININE: 0.68 mg/dL (ref 0.44–1.00)
Chloride: 108 mmol/L (ref 101–111)
GFR calc Af Amer: >60 mL/min (ref >60)
GFR calc non Af Amer: >60 mL/min (ref >60)
GLUCOSE: 134 mg/dL — AB (ref 65–99)
Potassium: 3.8 mmol/L (ref 3.5–5.1)
SODIUM: 140 mmol/L (ref 135–145)
Total Bilirubin: 0.5 mg/dL (ref 0.3–1.2)
Total Protein: 6.4 g/dL — ABNORMAL LOW (ref 6.5–8.1)

## 2015-11-07 LAB — CBC WITH DIFFERENTIAL/PLATELET
BASOS ABS: 0 10*3/uL (ref 0.0–0.1)
BASOS PCT: 0 %
EOS ABS: 0.2 10*3/uL (ref 0.0–0.7)
Eosinophils Relative: 2 %
HCT: 37.8 % (ref 36.0–46.0)
HEMOGLOBIN: 12 g/dL (ref 12.0–15.0)
LYMPHS ABS: 1.5 10*3/uL (ref 0.7–4.0)
Lymphocytes Relative: 24 %
MCH: 28.4 pg (ref 26.0–34.0)
MCHC: 31.7 g/dL (ref 30.0–36.0)
MCV: 89.4 fL (ref 78.0–100.0)
Monocytes Absolute: 0.5 10*3/uL (ref 0.1–1.0)
Monocytes Relative: 8 %
NEUTROS ABS: 4 10*3/uL (ref 1.7–7.7)
NEUTROS PCT: 66 %
Platelets: 211 10*3/uL (ref 150–400)
RBC: 4.23 MIL/uL (ref 3.87–5.11)
RDW: 13.8 % (ref 11.5–15.5)
WBC: 6.2 10*3/uL (ref 4.0–10.5)

## 2015-11-07 LAB — URINALYSIS, ROUTINE W REFLEX MICROSCOPIC
BILIRUBIN URINE: NEGATIVE
Bilirubin Urine: NEGATIVE
GLUCOSE, UA: NEGATIVE mg/dL
Glucose, UA: NEGATIVE mg/dL
HGB URINE DIPSTICK: NEGATIVE
Hgb urine dipstick: NEGATIVE
KETONES UR: NEGATIVE mg/dL
Ketones, ur: NEGATIVE mg/dL
LEUKOCYTES UA: NEGATIVE
Leukocytes, UA: NEGATIVE
NITRITE: NEGATIVE
Nitrite: NEGATIVE
PH: 5.5 (ref 5.0–8.0)
Protein, ur: NEGATIVE mg/dL
Protein, ur: NEGATIVE mg/dL
SPECIFIC GRAVITY, URINE: 1.015 (ref 1.005–1.030)
SPECIFIC GRAVITY, URINE: 1.016 (ref 1.005–1.030)
pH: 5.5 (ref 5.0–8.0)

## 2015-11-07 LAB — OCCULT BLOOD X 1 CARD TO LAB, STOOL: Fecal Occult Bld: NEGATIVE

## 2015-11-07 LAB — LIPASE, BLOOD: Lipase: 15 U/L (ref 11–51)

## 2015-11-07 MED ORDER — HYDROMORPHONE HCL 1 MG/ML IJ SOLN
0.5000 mg | Freq: Once | INTRAMUSCULAR | Status: AC
  Administered 2015-11-07: 0.5 mg via INTRAVENOUS
  Filled 2015-11-07: qty 1

## 2015-11-07 MED ORDER — ONDANSETRON HCL 4 MG/2ML IJ SOLN
4.0000 mg | Freq: Once | INTRAMUSCULAR | Status: AC
  Administered 2015-11-07: 4 mg via INTRAVENOUS
  Filled 2015-11-07: qty 2

## 2015-11-07 NOTE — ED Provider Notes (Signed)
CSN: [id number]     Arrival date & time 11/07/15  1453 History  By signing my name below, I, Helane Gunther, attest that this documentation has been prepared under the direction and in the presence of Pattricia Boss, MD. Electronically Signed: Helane Gunther, ED Scribe. 11/07/2015. 5:36 PM.    Chief Complaint  Patient presents with  . Flank Pain   The history is provided by the patient. No language interpreter was used.   HPI Comments: Wendy Carrillo is a 75 y.o. female with a PMHx of HTN and hepatitis who presents to the Emergency Department complaining of constant, aching, worsening right flank pain radiating into the right thigh onset 1 day ago. Pt states the pain was so severe today she was almost unable to stand. She reports associated RUQ and LUQ abdominal pain, nausea, frequency, and loss of appetite. She states she was able to eat some crackers today. She reports taking tramadol and cyclobenzaprine with moderate relief. She notes a PSHx of cholecystectomy, appendectomy, and hysterectomy. She denies a PMHx of kidney stones. Pt denies hematuria and dysuria.  Pt is allergic to penicillins, tinidazole, baclofen, and contrast media.   Past Medical History  Diagnosis Date  . Gastritis   . Hypertension   . GERD (gastroesophageal reflux disease)   . Family history of adverse reaction to anesthesia     "one of my son's passed out"  . History of blood transfusion 1992    "related to knee replacement"  . 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 History of duodenal ulcer   . Arthritis     "all over"  . Chronic lower back pain   . Spinal stenosis, lumbar    Past Surgical History  Procedure Laterality Date  . Replacement total knee Bilateral     "left in 1992; right in 2001"  . Bladder suspension    . Carpal tunnel release Bilateral 2010's  . Joint replacement    . Cholecystectomy open  1980's  . Appendectomy  ?1980's  .  Vaginal hysterectomy  1972  . Dilation and curettage of uterus  "several"  . Breast cyst excision Bilateral   . Breast surgery Left     "nipple taken off"  . Reduction mammaplasty Bilateral   . Cardiac catheterization    . Hernia repair    . Abdominal hernia repair    . Patella fracture surgery Left "after 1992"    "after the replacement"  . Cataract extraction w/ intraocular lens implant Left 2014  . Anterior and posterior vaginal repair  2015   Family History  Problem Relation Age of Onset  . Hypertension Mother   . Heart attack Father    Social History  Substance Use Topics  . Smoking status: Never Smoker   . Smokeless tobacco: Never Used  . Alcohol Use: No   OB History    No data available     Review of Systems  Constitutional: Positive for appetite change.  Gastrointestinal: Positive for nausea and abdominal pain.  Genitourinary: Positive for frequency and flank pain. Negative for dysuria and hematuria.  All other systems reviewed and are negative.   Allergies  Contrast media; Tinidazole; Baclofen; and Penicillins  Home Medications   Prior to Admission medications   Medication Sig Start Date End Date Taking? Authorizing Provider  celecoxib (CELEBREX) 200 MG capsule Take 200 mg by mouth daily as needed for mild pain.  05/16/10  Yes Historical Provider, MD  cetirizine (ZYRTEC) 10 MG tablet Take 10 mg by mouth daily as needed for allergies.  01/24/15  Yes Historical Provider, MD  cyclobenzaprine (FLEXERIL) 10 MG tablet Take 10 mg by mouth at bedtime as needed. For muscle spasms 04/29/14  Yes Historical Provider, MD  furosemide (LASIX) 20 MG tablet Take 20 mg by mouth daily as needed.   Yes Historical Provider, MD  gabapentin (NEURONTIN) 300 MG capsule Take 300 mg by mouth daily as needed (for pain).    Yes Historical Provider, MD  ramipril (ALTACE) 2.5 MG capsule Take 2.5 mg by mouth daily. 02/17/15  Yes Historical Provider, MD  traMADol (ULTRAM-ER) 200 MG 24 hr tablet  Take 200 mg by mouth daily as needed for pain.  01/31/15  Yes Historical Provider, MD  Vitamin D, Ergocalciferol, (DRISDOL) 50000 UNITS CAPS capsule Take 50,000 Units by mouth once a week. 02/17/15  Yes Historical Provider, MD  ALPRAZolam Duanne Moron) 0.25 MG tablet Take 1 tablet (0.25 mg total) by mouth daily as needed. 02/24/15   Ripudeep Krystal Eaton, MD  HYDROcodone-acetaminophen (NORCO/VICODIN) 5-325 MG per tablet Take 1 tablet by mouth every 6 (six) hours as needed for moderate pain or severe pain. 02/24/15   Ripudeep Krystal Eaton, MD  omeprazole-sodium bicarbonate (ZEGERID) 40-1100 MG per capsule Take 1 capsule by mouth every morning. 02/14/15   Historical Provider, MD   BP 161/90 mmHg  Pulse 69  Temp(Src) 98.5 F (36.9 C) (Oral)  Resp 18  Ht 5\' 3"  (1.6 m)  Wt 234 lb (106.142 kg)  BMI 41.46 kg/m2  SpO2 95% Physical Exam  Constitutional: She is oriented to person, place, and time. She appears well-developed and well-nourished.  HENT:  Head: Normocephalic and atraumatic.  Right Ear: External ear normal.  Left Ear: External ear normal.  Nose: Nose normal.  Mouth/Throat: Oropharynx is clear and moist.  Eyes: Conjunctivae and EOM are normal. Pupils are equal, round, and reactive to light.  Neck: Normal range of motion. Neck supple. No JVD present. No tracheal deviation present. No thyromegaly present.  Cardiovascular: Normal rate, regular rhythm, normal heart sounds and intact distal pulses.   Pulmonary/Chest: Effort normal and breath sounds normal. She has no wheezes.  Abdominal: Soft. Bowel sounds are normal. She exhibits no mass. There is tenderness. There is no guarding.  TTP in epigastric and LUQ  Genitourinary:  Bilateral CVA TTP  Musculoskeletal: Normal range of motion.  Lymphadenopathy:    She has no cervical adenopathy.  Neurological: She is alert and oriented to person, place, and time. She has normal reflexes. No cranial nerve deficit or sensory deficit. Gait normal. GCS eye subscore is 4. GCS  verbal subscore is 5. GCS motor subscore is 6.  Reflex Scores:      Bicep reflexes are 2+ on the right side and 2+ on the left side.      Patellar reflexes are 2+ on the right side and 2+ on the left side. Strength is normal and equal throughout. Cranial nerves grossly intact. Patient fluent. No gross ataxia and patient able to ambulate without difficulty.  Skin: Skin is warm and dry.  Psychiatric: She has a normal mood and affect. Her behavior is normal. Judgment and thought content normal.  Nursing note and vitals reviewed.   ED Course  Procedures  DIAGNOSTIC STUDIES: Oxygen Saturation is 96% on RA, adequate by my interpretation.    COORDINATION OF CARE: 5:34 PM - Discussed plans to order diagnostic studies and imaging, as  well as medication for pain. Pt advised of plan for treatment and pt agrees.  Labs Review Labs Reviewed  URINALYSIS, ROUTINE W REFLEX MICROSCOPIC (NOT AT [facility]) - Abnormal; Notable for the following:    APPearance CLOUDY (*)    All other components within normal limits  COMPREHENSIVE METABOLIC PANEL - Abnormal; Notable for the following:    Glucose, Bld 134 (*)    Calcium 8.5 (*)    Total Protein 6.4 (*)    ALT 11 (*)    Anion gap 3 (*)    All other components within normal limits  CBC WITH DIFFERENTIAL/PLATELET  LIPASE, BLOOD  OCCULT BLOOD X 1 CARD TO LAB, STOOL  URINALYSIS, ROUTINE W REFLEX MICROSCOPIC (NOT AT [facility])    Imaging Review Ct Abdomen Pelvis Wo Contrast  11/07/2015  CLINICAL DATA:  Acute right flank pain. EXAM: CT ABDOMEN AND PELVIS WITHOUT CONTRAST TECHNIQUE: Multidetector CT imaging of the abdomen and pelvis was performed following the standard protocol without IV contrast. COMPARISON:  CT scan of October 25, 2014. FINDINGS: Severe degenerative disc disease is noted at T12-L1 and L5-S1. Visualized lung bases are unremarkable. Status post cholecystectomy. No focal abnormality is noted in the liver, spleen or pancreas on these unenhanced  images. Adrenal glands are unremarkable. Stable large right renal cyst is noted. No hydronephrosis or renal obstruction is noted. No renal or ureteral calculi are noted. There is no evidence of bowel obstruction. Status post appendectomy. There is no evidence of abdominal aortic aneurysm. No abnormal fluid collection is noted. Sigmoid diverticulosis is noted without inflammation. Urinary bladder appears normal. Status post hysterectomy. No significant adenopathy is noted. IMPRESSION: Sigmoid diverticulosis is noted without inflammation. No hydronephrosis or renal obstruction is noted. No renal or ureteral calculi are noted. No acute abnormality seen in the abdomen or pelvis. Electronically Signed   By: Marijo Conception, M.D.   On: 11/07/2015 19:35   Dg Chest 2 View  11/07/2015  CLINICAL DATA:  Right-sided chest pain with shortness of breath and weakness. EXAM: CHEST  2 VIEW COMPARISON:  02/21/2015. FINDINGS: Mild cardiomegaly. Tortuous aorta. No active infiltrates or failure. No effusion or pneumothorax. IMPRESSION: No active cardiopulmonary disease. Electronically Signed   By: Staci Righter M.D.   On: 11/07/2015 19:19   I have personally reviewed and evaluated these images and lab results as part of my medical decision-making.   EKG Interpretation None      MDM   Final diagnoses:  Right-sided low back pain without sciatica   I personally performed the services described in this documentation, which was scribed in my presence. The recorded information has been reviewed and considered.   Pattricia Boss, MD 11/07/15 1945

## 2015-11-07 NOTE — ED Notes (Signed)
Right flank/back pain since yesterday- radiates into thigh- took tramadol and cyclobenzaprine yesterday with some relief

## 2015-11-07 NOTE — Discharge Instructions (Signed)
Back Pain, Adult °Back pain is very common in adults. The cause of back pain is rarely dangerous and the pain often gets better over time. The cause of your back pain may not be known. Some common causes of back pain include: °· Strain of the muscles or ligaments supporting the spine. °· Wear and tear (degeneration) of the spinal disks. °· Arthritis. °· Direct injury to the back. °For many people, back pain may return. Since back pain is rarely dangerous, most people can learn to manage this condition on their own. °HOME CARE INSTRUCTIONS °Watch your back pain for any changes. The following actions may help to lessen any discomfort you are feeling: °· Remain active. It is stressful on your back to sit or stand in one place for long periods of time. Do not sit, drive, or stand in one place for more than 30 minutes at a time. Take short walks on even surfaces as soon as you are able. Try to increase the length of time you walk each day. °· Exercise regularly as directed by your health care provider. Exercise helps your back heal faster. It also helps avoid future injury by keeping your muscles strong and flexible. °· Do not stay in bed. Resting more than 1-2 days can delay your recovery. °· Pay attention to your body when you bend and lift. The most comfortable positions are those that put less stress on your recovering back. Always use proper lifting techniques, including: °· Bending your knees. °· Keeping the load close to your body. °· Avoiding twisting. °· Find a comfortable position to sleep. Use a firm mattress and lie on your side with your knees slightly bent. If you lie on your back, put a pillow under your knees. °· Avoid feeling anxious or stressed. Stress increases muscle tension and can worsen back pain. It is important to recognize when you are anxious or stressed and learn ways to manage it, such as with exercise. °· Take medicines only as directed by your health care provider. Over-the-counter  medicines to reduce pain and inflammation are often the most helpful. Your health care provider may prescribe muscle relaxant drugs. These medicines help dull your pain so you can more quickly return to your normal activities and healthy exercise. °· Apply ice to the injured area: °· Put ice in a plastic bag. °· Place a towel between your skin and the bag. °· Leave the ice on for 20 minutes, 2-3 times a day for the first 2-3 days. After that, ice and heat may be alternated to reduce pain and spasms. °· Maintain a healthy weight. Excess weight puts extra stress on your back and makes it difficult to maintain good posture. °SEEK MEDICAL CARE IF: °· You have pain that is not relieved with rest or medicine. °· You have increasing pain going down into the legs or buttocks. °· You have pain that does not improve in one week. °· You have night pain. °· You lose weight. °· You have a fever or chills. °SEEK IMMEDIATE MEDICAL CARE IF:  °· You develop new bowel or bladder control problems. °· You have unusual weakness or numbness in your arms or legs. °· You develop nausea or vomiting. °· You develop abdominal pain. °· You feel faint. °  °This information is not intended to replace advice given to you by your health care provider. Make sure you discuss any questions you have with your health care provider. °  °Document Released: 10/22/2005 Document Revised: 11/12/2014 Document Reviewed: 02/23/2014 °Elsevier Interactive Patient Education ©2016 [employer] Abdominal Pain, Adult Many things can cause abdominal pain. Usually, abdominal pain is not caused by a disease and will improve without treatment. It can often be observed and treated at home. Your health care provider will do a physical exam and possibly order blood tests and X-rays to help determine the seriousness of your pain. However, in many cases, more time must pass before a clear cause of the pain can be found. Before that point, your health care provider may not  know if you need more testing or further treatment. HOME CARE INSTRUCTIONS Monitor your abdominal pain for any changes. The following actions may help to alleviate any discomfort you are experiencing:  Only take over-the-counter or prescription medicines as directed by your health care provider.  Do not take laxatives unless directed to do so by your health care provider.  Try a clear liquid diet (broth, tea, or water) as directed by your health care provider. Slowly move to a bland diet as tolerated. SEEK MEDICAL CARE IF:  You have unexplained abdominal pain.  You have abdominal pain associated with nausea or diarrhea.  You have pain when you urinate or have a bowel movement.  You experience abdominal pain that wakes you in the night.  You have abdominal pain that is worsened or improved by eating food.  You have abdominal pain that is worsened with eating fatty foods.  You have a fever. SEEK IMMEDIATE MEDICAL CARE IF:  Your pain does not go away within 2 hours.  You keep throwing up (vomiting).  Your pain is felt only in portions of the abdomen, such as the right side or the left lower portion of the abdomen.  You pass bloody or black tarry stools. MAKE SURE YOU:  Understand these instructions.  Will watch your condition.  Will get help right away if you are not doing well or get worse.   This information is not intended to replace advice given to you by your health care provider. Make sure you discuss any questions you have with your health care provider.   Document Released: 08/01/2005 Document Revised: 07/13/2015 Document Reviewed: 07/01/2013 Elsevier Interactive Patient Education [employer].

## 2015-11-07 NOTE — ED Notes (Signed)
Pt given urine cup and will attempt to obtain specimen when able

## 2015-11-08 ENCOUNTER — Emergency Department (HOSPITAL_BASED_OUTPATIENT_CLINIC_OR_DEPARTMENT_OTHER)
Admission: EM | Admit: 2015-11-08 | Discharge: 2015-11-08 | Disposition: A | Discharge status: Home / Self Care | Payer: Medicare Other | Attending: Emergency Medicine | Admitting: Emergency Medicine

## 2015-11-08 ENCOUNTER — Encounter (HOSPITAL_BASED_OUTPATIENT_CLINIC_OR_DEPARTMENT_OTHER): Payer: Self-pay | Admitting: *Deleted

## 2015-11-08 DIAGNOSIS — M5136 Other intervertebral disc degeneration, lumbar region: Secondary | ICD-10-CM | POA: Insufficient documentation

## 2015-11-08 DIAGNOSIS — Z9889 Other specified postprocedural states: Secondary | ICD-10-CM | POA: Diagnosis not present

## 2015-11-08 DIAGNOSIS — Z79899 Other long term (current) drug therapy: Secondary | ICD-10-CM | POA: Insufficient documentation

## 2015-11-08 DIAGNOSIS — Z8619 Personal history of other infectious and parasitic diseases: Secondary | ICD-10-CM | POA: Insufficient documentation

## 2015-11-08 DIAGNOSIS — K219 Gastro-esophageal reflux disease without esophagitis: Secondary | ICD-10-CM | POA: Insufficient documentation

## 2015-11-08 DIAGNOSIS — M1990 Unspecified osteoarthritis, unspecified site: Secondary | ICD-10-CM | POA: Insufficient documentation

## 2015-11-08 DIAGNOSIS — G8929 Other chronic pain: Secondary | ICD-10-CM | POA: Insufficient documentation

## 2015-11-08 DIAGNOSIS — R112 Nausea with vomiting, unspecified: Secondary | ICD-10-CM | POA: Insufficient documentation

## 2015-11-08 DIAGNOSIS — Z880 Allergy status to penicillin: Secondary | ICD-10-CM | POA: Insufficient documentation

## 2015-11-08 DIAGNOSIS — R109 Unspecified abdominal pain: Secondary | ICD-10-CM | POA: Diagnosis present

## 2015-11-08 DIAGNOSIS — I10 Essential (primary) hypertension: Secondary | ICD-10-CM | POA: Diagnosis not present

## 2015-11-08 MED ORDER — ONDANSETRON 4 MG PO TBDP
4.0000 mg | ORAL_TABLET | Freq: Once | ORAL | Status: AC
  Administered 2015-11-08: 4 mg via ORAL
  Filled 2015-11-08: qty 1

## 2015-11-08 MED ORDER — CYCLOBENZAPRINE HCL 10 MG PO TABS: 10.0000 mg | ORAL_TABLET | Freq: Two times a day (BID) | ORAL | Status: DC | PRN

## 2015-11-08 MED ORDER — HYDROMORPHONE HCL 1 MG/ML IJ SOLN
1.0000 mg | Freq: Once | INTRAMUSCULAR | Status: AC
  Administered 2015-11-08: 1 mg via INTRAMUSCULAR
  Filled 2015-11-08: qty 1

## 2015-11-08 MED ORDER — CYCLOBENZAPRINE HCL 10 MG PO TABS
10.0000 mg | ORAL_TABLET | Freq: Once | ORAL | Status: AC
  Administered 2015-11-08: 10 mg via ORAL
  Filled 2015-11-08: qty 1

## 2015-11-08 MED ORDER — KETOROLAC TROMETHAMINE 60 MG/2ML IM SOLN
60.0000 mg | Freq: Once | INTRAMUSCULAR | Status: AC
  Administered 2015-11-08: 60 mg via INTRAMUSCULAR
  Filled 2015-11-08: qty 2

## 2015-11-08 MED FILL — CYCLOBENZAPRINE 10 MG TAB: 10 | 10 days supply | Qty: 20 | Fill #0

## 2015-11-08 NOTE — ED Provider Notes (Signed)
CSN: [id number]     Arrival date & time 11/08/15  0932 History   First MD Initiated Contact with Patient 11/08/15 1137     Chief Complaint  Patient presents with  . Abdominal Pain     (Consider location/radiation/quality/duration/timing/severity/associated sxs/prior Treatment) HPI Comments: Pain started intensely Sunday AM Sunday night came back Right lower groin, feels it in back when standing up Radiates down leg, right thigh down to lower leg Mostly RLQ pain Nausea and vomiting last night, once Tramadol for pain at home Came to ED yseterday,had CT abdomen without abnormalities      Patient is a 75 y.o. female presenting with abdominal pain.  Abdominal Pain Associated symptoms: nausea and vomiting   Associated symptoms: no chest pain, no constipation, no cough, no diarrhea, no fever, no shortness of breath, no sore throat, no vaginal bleeding and no vaginal discharge     Past Medical History  Diagnosis Date  . Gastritis   . Hypertension   . GERD (gastroesophageal reflux disease)   . Family history of adverse reaction to anesthesia     "one of my son's passed out"  . History of blood transfusion 1992    "related to knee replacement"  . 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 History of duodenal ulcer   . Arthritis     "all over"  . Chronic lower back pain   . Spinal stenosis, lumbar    Past Surgical History  Procedure Laterality Date  . Replacement total knee Bilateral     "left in 1992; right in 2001"  . Bladder suspension    . Carpal tunnel release Bilateral 2010's  . Joint replacement    . Cholecystectomy open  1980's  . Appendectomy  ?1980's  . Vaginal hysterectomy  1972  . Dilation and curettage of uterus  "several"  . Breast cyst excision Bilateral   . Breast surgery Left     "nipple taken off"  . Reduction mammaplasty Bilateral   . Cardiac catheterization    . Hernia repair    . Abdominal  hernia repair    . Patella fracture surgery Left "after 1992"    "after the replacement"  . Cataract extraction w/ intraocular lens implant Left 2014  . Anterior and posterior vaginal repair  2015   Family History  Problem Relation Age of Onset  . Hypertension Mother   . Heart attack Father    Social History  Substance Use Topics  . Smoking status: Never Smoker   . Smokeless tobacco: Never Used  . Alcohol Use: No   OB History    No data available     Review of Systems  Constitutional: Negative for fever.  HENT: Negative for sore throat.   Eyes: Negative for visual disturbance.  Respiratory: Negative for cough and shortness of breath.   Cardiovascular: Negative for chest pain.  Gastrointestinal: Positive for nausea, vomiting and abdominal pain. Negative for diarrhea and constipation.  Genitourinary: Negative for vaginal bleeding, vaginal discharge and difficulty urinating.  Musculoskeletal: Positive for back pain. Negative for neck pain.  Skin: Negative for rash.  Neurological: Negative for syncope, weakness, numbness and headaches.      Allergies  Contrast media; Tinidazole; Baclofen; and Penicillins  Home Medications   Prior to Admission medications   Medication Sig Start Date End Date Taking? Authorizing Provider  ALPRAZolam (XANAX) 0.25 MG tablet Take 1 tablet (0.25 mg total) by  mouth daily as needed. 02/24/15   Ripudeep Krystal Eaton, MD  celecoxib (CELEBREX) 200 MG capsule Take 200 mg by mouth daily as needed for mild pain.  05/16/10   Historical Provider, MD  cetirizine (ZYRTEC) 10 MG tablet Take 10 mg by mouth daily as needed for allergies.  01/24/15   Historical Provider, MD  cyclobenzaprine (FLEXERIL) 10 MG tablet Take 1 tablet (10 mg total) by mouth 2 (two) times daily as needed for muscle spasms. 11/08/15   Gareth Morgan, MD  furosemide (LASIX) 20 MG tablet Take 20 mg by mouth daily as needed.    Historical Provider, MD  gabapentin (NEURONTIN) 300 MG capsule Take 300  mg by mouth daily as needed (for pain).     Historical Provider, MD  HYDROcodone-acetaminophen (NORCO/VICODIN) 5-325 MG per tablet Take 1 tablet by mouth every 6 (six) hours as needed for moderate pain or severe pain. 02/24/15   Ripudeep Krystal Eaton, MD  omeprazole-sodium bicarbonate (ZEGERID) 40-1100 MG per capsule Take 1 capsule by mouth every morning. 02/14/15   Historical Provider, MD  ramipril (ALTACE) 2.5 MG capsule Take 2.5 mg by mouth daily. 02/17/15   Historical Provider, MD  traMADol (ULTRAM-ER) 200 MG 24 hr tablet Take 200 mg by mouth daily as needed for pain.  01/31/15   Historical Provider, MD  Vitamin D, Ergocalciferol, (DRISDOL) 50000 UNITS CAPS capsule Take 50,000 Units by mouth once a week. 02/17/15   Historical Provider, MD   BP 134/76 mmHg  Pulse 70  Temp(Src) 97.8 F (36.6 C) (Oral)  Resp 18  Ht 5' 3.5" (1.613 m)  Wt 234 lb (106.142 kg)  BMI 40.80 kg/m2  SpO2 97% Physical Exam  Constitutional: She is oriented to person, place, and time. She appears well-developed and well-nourished. No distress.  HENT:  Head: Normocephalic and atraumatic.  Eyes: Conjunctivae and EOM are normal.  Neck: Normal range of motion.  Cardiovascular: Normal rate, regular rhythm, normal heart sounds and intact distal pulses.  Exam reveals no gallop and no friction rub.   No murmur heard. Pulmonary/Chest: Effort normal and breath sounds normal. No respiratory distress. She has no wheezes. She has no rales.  Abdominal: Soft. She exhibits no distension. There is no tenderness (no significant abdominal tenderness). There is no guarding.  Musculoskeletal: She exhibits no edema.       Lumbar back: She exhibits tenderness and bony tenderness.  Neurological: She is alert and oriented to person, place, and time.  Skin: Skin is warm and dry. No rash noted. She is not diaphoretic. No erythema.  Nursing note and vitals reviewed.   ED Course  Procedures (including critical care time) Labs Review Labs Reviewed -  No data to display  Imaging Review Ct Abdomen Pelvis Wo Contrast  11/07/2015  CLINICAL DATA:  Acute right flank pain. EXAM: CT ABDOMEN AND PELVIS WITHOUT CONTRAST TECHNIQUE: Multidetector CT imaging of the abdomen and pelvis was performed following the standard protocol without IV contrast. COMPARISON:  CT scan of October 25, 2014. FINDINGS: Severe degenerative disc disease is noted at T12-L1 and L5-S1. Visualized lung bases are unremarkable. Status post cholecystectomy. No focal abnormality is noted in the liver, spleen or pancreas on these unenhanced images. Adrenal glands are unremarkable. Stable large right renal cyst is noted. No hydronephrosis or renal obstruction is noted. No renal or ureteral calculi are noted. There is no evidence of bowel obstruction. Status post appendectomy. There is no evidence of abdominal aortic aneurysm. No abnormal fluid collection is noted. Sigmoid diverticulosis is  noted without inflammation. Urinary bladder appears normal. Status post hysterectomy. No significant adenopathy is noted. IMPRESSION: Sigmoid diverticulosis is noted without inflammation. No hydronephrosis or renal obstruction is noted. No renal or ureteral calculi are noted. No acute abnormality seen in the abdomen or pelvis. Electronically Signed   By: Marijo Conception, M.D.   On: 11/07/2015 19:35   Dg Chest 2 View  11/07/2015  CLINICAL DATA:  Right-sided chest pain with shortness of breath and weakness. EXAM: CHEST  2 VIEW COMPARISON:  02/21/2015. FINDINGS: Mild cardiomegaly. Tortuous aorta. No active infiltrates or failure. No effusion or pneumothorax. IMPRESSION: No active cardiopulmonary disease. Electronically Signed   By: Staci Righter M.D.   On: 11/07/2015 19:19   I have personally reviewed and evaluated these images and lab results as part of my medical decision-making.   EKG Interpretation None      MDM   Final diagnoses:  Degenerative disc disease, lumbar    75 year old female with a  history of hypertension and hepatitis, appendectomy, hysterectomy, cholecystectomy, abd hernia repair, knee replacement, chronic back pain, presents with concern for right lower back pain and right abdominal pain. Patient was seen yesterday in the emergency department for the same. She had a CT abdomen and pelvis without contrast performed which showed no hydronephrosis, no renal obstruction, no acute abnormality in the abdomen and pelvis however did show degenerative disc disease at T12-L1 and L5-S1. Patient had a urinalysis performed yesterday which showed no signs of UTI. She also had lab work including CBC and CMP yesterday were which were within normal limits. Given evaluation done yesterday have low suspicion for any acute etiology of patient's abdominal pain. Patient reports having an oophorectomy of one side, however is unsure which, however do not feel history and physical exam are consistent with ovarian torsion, tubo-ovarian abscess or PID.  Patient's right lower back pain radiating to the abdomen and right leg is likely secondary to her degenerative disc disease. Patient with a normal neurologic exam, no loss of control of bowel or bladder and have low suspicion for cauda equina or acute spinal compression. She was given Dilaudid for pain control in the emergency department and discharged with prescription for Flexeril and zofran. Recommend PCP follow up. Patient discharged in stable condition with understanding of reasons to return.    Gareth Morgan, MD 11/08/15 [phone]

## 2015-11-08 NOTE — Discharge Instructions (Signed)
Degenerative Disk Disease  Degenerative disk disease is a condition caused by the changes that occur in spinal disks as you grow older. Spinal disks are soft and compressible disks located between the bones of your spine (vertebrae). These disks act like shock absorbers. Degenerative disk disease can affect the whole spine. However, the neck and lower back are most commonly affected. Many changes can occur in the spinal disks with aging, such as:  · The spinal disks may dry and shrink.  · Small tears may occur in the tough, outer covering of the disk (annulus).  · The disk space may become smaller due to loss of water.  · Abnormal growths in the bone (spurs) may occur. This can put pressure on the nerve roots exiting the spinal canal, causing pain.  · The spinal canal may become narrowed.  RISK FACTORS   · Being overweight.  · Having a family history of degenerative disk disease.  · Smoking.  · There is increased risk if you are doing heavy lifting or have a sudden injury.  SIGNS AND SYMPTOMS   Symptoms vary from person to person and may include:  · Pain that varies in intensity. Some people have no pain, while others have severe pain. The location of the pain depends on the part of your backbone that is affected.  ¨ You will have neck or arm pain if a disk in the neck area is affected.  ¨ You will have pain in your back, buttocks, or legs if a disk in the lower back is affected.  · Pain that becomes worse while bending, reaching up, or with twisting movements.  · Pain that may start gradually and then get worse as time passes. It may also start after a major or minor injury.  · Numbness or tingling in the arms or legs.  DIAGNOSIS   Your health care provider will ask you about your symptoms and about activities or habits that may cause the pain. He or she may also ask about any injuries, diseases, or treatments you have had. Your health care provider will examine you to check for the range of movement that is  possible in the affected area, to check for strength in your extremities, and to check for sensation in the areas of the arms and legs supplied by different nerve roots. You may also have:   · An X-ray of the spine.  · Other imaging tests, such as MRI.  TREATMENT   Your health care provider will advise you on the best plan for treatment. Treatment may include:  · Medicines.  · Rehabilitation exercises.  HOME CARE INSTRUCTIONS   · Follow proper lifting and walking techniques as advised by your health care provider.  · Maintain good posture.  · Exercise regularly as advised by your health care provider.  · Perform relaxation exercises.  · Change your sitting, standing, and sleeping habits as advised by your health care provider.  · Change positions frequently.  · Lose weight or maintain a healthy weight as advised by your health care provider.  · Do not use any tobacco products, including cigarettes, chewing tobacco, or electronic cigarettes. If you need help quitting, ask your health care provider.  · Wear supportive footwear.  · Take medicines only as directed by your health care provider.  SEEK MEDICAL CARE IF:   · Your pain does not go away within 1-4 weeks.  · You have significant appetite or weight loss.  SEEK IMMEDIATE MEDICAL CARE IF:   ·   instructions. Back Exercises The following exercises strengthen the muscles that help to support the back. They also help to keep the lower back flexible. Doing these exercises can help to prevent back pain or lessen existing pain. If you have back pain or discomfort, try doing these exercises 2-3 times each day or as told by your health care provider. When the pain goes away, do them once each day, but increase the number of times that you repeat the steps for  each exercise (do more repetitions). If you do not have back pain or discomfort, do these exercises once each day or as told by your health care provider. EXERCISES Single Knee to Chest Repeat these steps 3-5 times for each leg: Lie on your back on a firm bed or the floor with your legs extended. Bring one knee to your chest. Your other leg should stay extended and in contact with the floor. Hold your knee in place by grabbing your knee or thigh. Pull on your knee until you feel a gentle stretch in your lower back. Hold the stretch for 10-30 seconds. Slowly release and straighten your leg. Pelvic Tilt Repeat these steps 5-10 times: Lie on your back on a firm bed or the floor with your legs extended. Bend your knees so they are pointing toward the ceiling and your feet are flat on the floor. Tighten your lower abdominal muscles to press your lower back against the floor. This motion will tilt your pelvis so your tailbone points up toward the ceiling instead of pointing to your feet or the floor. With gentle tension and even breathing, hold this position for 5-10 seconds. Cat-Cow Repeat these steps until your lower back becomes more flexible: Get into a hands-and-knees position on a firm surface. Keep your hands under your shoulders, and keep your knees under your hips. You may place padding under your knees for comfort. Let your head hang down, and point your tailbone toward the floor so your lower back becomes rounded like the back of a cat. Hold this position for 5 seconds. Slowly lift your head and point your tailbone up toward the ceiling so your back forms a sagging arch like the back of a cow. Hold this position for 5 seconds. Press-Ups Repeat these steps 5-10 times: Lie on your abdomen (face-down) on the floor. Place your palms near your head, about shoulder-width apart. While you keep your back as relaxed as possible and keep your hips on the floor, slowly straighten your arms to  raise the top half of your body and lift your shoulders. Do not use your back muscles to raise your upper torso. You may adjust the placement of your hands to make yourself more comfortable. Hold this position for 5 seconds while you keep your back relaxed. Slowly return to lying flat on the floor. Bridges Repeat these steps 10 times: Lie on your back on a firm surface. Bend your knees so they are pointing toward the ceiling and your feet are flat on the floor. Tighten your buttocks muscles and lift your buttocks off of the floor until your waist is at almost the same height as your knees. You should feel the muscles working in your buttocks and the back of your thighs. If you do not feel these muscles, slide your feet 1-2 inches farther away from your buttocks. Hold this position for 3-5 seconds. Slowly lower your hips to the starting position, and allow your buttocks muscles to relax completely. If this exercise is too  easy, try doing it with your arms crossed over your chest. Abdominal Crunches Repeat these steps 5-10 times: Lie on your back on a firm bed or the floor with your legs extended. Bend your knees so they are pointing toward the ceiling and your feet are flat on the floor. Cross your arms over your chest. Tip your chin slightly toward your chest without bending your neck. Tighten your abdominal muscles and slowly raise your trunk (torso) high enough to lift your shoulder blades a tiny bit off of the floor. Avoid raising your torso higher than that, because it can put too much stress on your low back and it does not help to strengthen your abdominal muscles. Slowly return to your starting position. Back Lifts Repeat these steps 5-10 times: Lie on your abdomen (face-down) with your arms at your sides, and rest your forehead on the floor. Tighten the muscles in your legs and your buttocks. Slowly lift your chest off of the floor while you keep your hips pressed to the floor. Keep  the back of your head in line with the curve in your back. Your eyes should be looking at the floor. Hold this position for 3-5 seconds. Slowly return to your starting position. SEEK MEDICAL CARE IF: Your back pain or discomfort gets much worse when you do an exercise. Your back pain or discomfort does not lessen within 2 hours after you exercise. If you have any of these problems, stop doing these exercises right away. Do not do them again unless your health care provider says that you can. SEEK IMMEDIATE MEDICAL CARE IF: You develop sudden, severe back pain. If this happens, stop doing the exercises right away. Do not do them again unless your health care provider says that you can.   This information is not intended to replace advice given to you by your health care provider. Make sure you discuss any questions you have with your health care provider.   Document Released: 11/29/2004 Document Revised: 07/13/2015 Document Reviewed: 12/16/2014 Elsevier Interactive Patient Education [employer].   Will watch your condition.  Will get help right away if you are not doing well or get worse.   This information is not intended to replace advice given to you by your health care provider. Make sure you discuss any questions you have with your health care provider.   Document Released: 08/19/2007 Document Revised: 11/12/2014 Document Reviewed: 02/23/2014 Elsevier Interactive Patient Education [employer].

## 2015-11-08 NOTE — ED Notes (Signed)
Pt to triage in w/c, reports abd pain sudden onset Sunday morning. Seen here yesterday, told to return today if not feeling better. Pain is to rlq and right low back, radiates down her left leg, pain increases with certain movements and positioning.

## 2015-11-10 DIAGNOSIS — M159 Polyosteoarthritis, unspecified: Secondary | ICD-10-CM | POA: Insufficient documentation

## 2015-11-16 DIAGNOSIS — M549 Dorsalgia, unspecified: Secondary | ICD-10-CM | POA: Insufficient documentation

## 2016-03-08 DIAGNOSIS — M4802 Spinal stenosis, cervical region: Secondary | ICD-10-CM | POA: Insufficient documentation

## 2016-03-08 DIAGNOSIS — Z6841 Body Mass Index (BMI) 40.0 and over, adult: Secondary | ICD-10-CM

## 2016-03-08 DIAGNOSIS — M4316 Spondylolisthesis, lumbar region: Secondary | ICD-10-CM | POA: Insufficient documentation

## 2016-03-08 DIAGNOSIS — M48061 Spinal stenosis, lumbar region without neurogenic claudication: Secondary | ICD-10-CM | POA: Insufficient documentation

## 2016-03-08 DIAGNOSIS — M542 Cervicalgia: Secondary | ICD-10-CM | POA: Insufficient documentation

## 2016-03-22 DIAGNOSIS — M25511 Pain in right shoulder: Secondary | ICD-10-CM | POA: Insufficient documentation

## 2016-03-22 DIAGNOSIS — M5030 Other cervical disc degeneration, unspecified cervical region: Secondary | ICD-10-CM | POA: Insufficient documentation

## 2016-03-22 DIAGNOSIS — M25512 Pain in left shoulder: Secondary | ICD-10-CM

## 2016-05-09 DIAGNOSIS — M5416 Radiculopathy, lumbar region: Secondary | ICD-10-CM | POA: Insufficient documentation

## 2016-06-08 DIAGNOSIS — Z96653 Presence of artificial knee joint, bilateral: Secondary | ICD-10-CM | POA: Insufficient documentation

## 2016-08-26 ENCOUNTER — Emergency Department (HOSPITAL_COMMUNITY): Payer: Medicare Other

## 2016-08-26 ENCOUNTER — Encounter (HOSPITAL_COMMUNITY): Payer: Self-pay

## 2016-08-26 ENCOUNTER — Emergency Department (HOSPITAL_COMMUNITY)
Admission: EM | Admit: 2016-08-26 | Discharge: 2016-08-27 | Disposition: A | Discharge status: Home / Self Care | Payer: Medicare Other | Attending: Emergency Medicine | Admitting: Emergency Medicine

## 2016-08-26 DIAGNOSIS — Z79899 Other long term (current) drug therapy: Secondary | ICD-10-CM | POA: Insufficient documentation

## 2016-08-26 DIAGNOSIS — R0789 Other chest pain: Secondary | ICD-10-CM | POA: Insufficient documentation

## 2016-08-26 DIAGNOSIS — K4090 Unilateral inguinal hernia, without obstruction or gangrene, not specified as recurrent: Secondary | ICD-10-CM | POA: Insufficient documentation

## 2016-08-26 DIAGNOSIS — I10 Essential (primary) hypertension: Secondary | ICD-10-CM | POA: Insufficient documentation

## 2016-08-26 DIAGNOSIS — K469 Unspecified abdominal hernia without obstruction or gangrene: Secondary | ICD-10-CM

## 2016-08-26 DIAGNOSIS — R1032 Left lower quadrant pain: Secondary | ICD-10-CM

## 2016-08-26 LAB — COMPREHENSIVE METABOLIC PANEL
ALT: 11 U/L — AB (ref 14–54)
AST: 13 U/L — AB (ref 15–41)
Albumin: 3.7 g/dL (ref 3.5–5.0)
Alkaline Phosphatase: 52 U/L (ref 38–126)
Anion gap: 9 (ref 5–15)
BILIRUBIN TOTAL: 0.3 mg/dL (ref 0.3–1.2)
BUN: 7 mg/dL (ref 6–20)
CHLORIDE: 105 mmol/L (ref 101–111)
CO2: 26 mmol/L (ref 22–32)
CREATININE: 0.77 mg/dL (ref 0.44–1.00)
Calcium: 9.2 mg/dL (ref 8.9–10.3)
GFR calc Af Amer: >60 mL/min (ref >60)
Glucose, Bld: 99 mg/dL (ref 65–99)
Potassium: 3.8 mmol/L (ref 3.5–5.1)
Sodium: 140 mmol/L (ref 135–145)
TOTAL PROTEIN: 6.1 g/dL — AB (ref 6.5–8.1)

## 2016-08-26 LAB — CBC
HEMATOCRIT: 39.4 % (ref 36.0–46.0)
HEMOGLOBIN: 12.7 g/dL (ref 12.0–15.0)
MCH: 28.3 pg (ref 26.0–34.0)
MCHC: 32.2 g/dL (ref 30.0–36.0)
MCV: 87.9 fL (ref 78.0–100.0)
Platelets: 228 10*3/uL (ref 150–400)
RBC: 4.48 MIL/uL (ref 3.87–5.11)
RDW: 13.6 % (ref 11.5–15.5)
WBC: 7.7 10*3/uL (ref 4.0–10.5)

## 2016-08-26 LAB — I-STAT TROPONIN, ED
TROPONIN I, POC: 0.01 ng/mL (ref 0.00–0.08)
Troponin i, poc: 0.05 ng/mL (ref 0.00–0.08)

## 2016-08-26 MED ORDER — HYDROCODONE-ACETAMINOPHEN 5-325 MG PO TABS
1.0000 | ORAL_TABLET | Freq: Once | ORAL | Status: AC
  Administered 2016-08-26: 1 via ORAL
  Filled 2016-08-26: qty 1

## 2016-08-26 NOTE — ED Provider Notes (Signed)
[facility] DEPT Provider Note   CSN: [id number] Arrival date & time: 08/26/16  1930     History   Chief Complaint Chief Complaint  Patient presents with  . Chest Pain  . Abdominal Pain    HPI Wendy Carrillo is a 75 y.o. female.  Patient presents with multiple complaints including left sided rib/chest wall pain for around a month, worsening over the last two weeks and exacerbated by evening. Associated with nausea and some vomiting but not since Wednesday. No fever or dyspnea, no cough. She also notes several weeks of worsening left lower quadrant abdoinal/upper thigh pain and "swelling" of the area.   The history is provided by the patient. No language interpreter was used.  Abdominal Pain   This is a recurrent problem. The current episode started more than 1 week ago. The problem occurs constantly. The problem has been gradually worsening. The pain is associated with an unknown factor. The pain is located in the LLQ and legs. The quality of the pain is throbbing and cramping. The pain is at a severity of 5/10. The pain is moderate. Associated symptoms include nausea and vomiting. Pertinent negatives include anorexia, fever, diarrhea, constipation and dysuria. The symptoms are aggravated by certain positions, coughing and palpation. Nothing relieves the symptoms. Past workup does not include GI consult, CT scan, ultrasound or surgery. Past medical history comments: None.    Past Medical History:  Diagnosis Date  . Arthritis    "all over"  . Chronic lower back pain   . Family history of adverse reaction to anesthesia    "one of my son's passed out"  . Gastritis   . GERD (gastroesophageal reflux disease)   . 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 History of blood transfusion 1992   "related to knee replacement"  . History of duodenal ulcer   . Hypertension   . Spinal stenosis, lumbar     Patient Active  Problem List   Diagnosis Date Noted  . Pain in the chest 04/20/2015  . Morbid obesity ([place]) 04/20/2015  . Dyspnea 04/20/2015  . Atypical chest pain 04/20/2015  . GERD (gastroesophageal reflux disease) 02/23/2015  . Essential hypertension 02/23/2015  . Chronic back pain 02/23/2015  . Chest pain 02/22/2015    Past Surgical History:  Procedure Laterality Date  . ABDOMINAL HERNIA REPAIR    . ANTERIOR AND POSTERIOR VAGINAL REPAIR  2015  . APPENDECTOMY  ?1980's  . BLADDER SUSPENSION    . BREAST CYST EXCISION Bilateral   . BREAST SURGERY Left    "nipple taken off"  . CARDIAC CATHETERIZATION    . CARPAL TUNNEL RELEASE Bilateral 2010's  . CATARACT EXTRACTION W/ INTRAOCULAR LENS IMPLANT Left 2014  . CHOLECYSTECTOMY OPEN  1980's  . DILATION AND CURETTAGE OF UTERUS  "several"  . HERNIA REPAIR    . JOINT REPLACEMENT    . PATELLA FRACTURE SURGERY Left "after 1992"   "after the replacement"  . REDUCTION MAMMAPLASTY Bilateral   . REPLACEMENT TOTAL KNEE Bilateral    "left in 1992; right in 2001"  . VAGINAL HYSTERECTOMY  1972    OB History    No data available       Home Medications    Prior to Admission medications   Medication Sig Start Date End Date Taking? Authorizing Provider  ALPRAZolam (XANAX) 0.25 MG tablet Take 1 tablet (0.25 mg total) by mouth daily as needed. Patient  taking differently: Take 0.25 mg by mouth daily as needed for sleep.  02/24/15  Yes Ripudeep Krystal Eaton, MD  celecoxib (CELEBREX) 200 MG capsule Take 200 mg by mouth daily as needed for mild pain.  05/16/10  Yes Historical Provider, MD  cetirizine (ZYRTEC) 10 MG tablet Take 10 mg by mouth daily as needed for allergies.  01/24/15  Yes Historical Provider, MD  cyclobenzaprine (FLEXERIL) 10 MG tablet Take 1 tablet (10 mg total) by mouth 2 (two) times daily as needed for muscle spasms. 11/08/15  Yes Gareth Morgan, MD  furosemide (LASIX) 20 MG tablet Take 20 mg by mouth daily as needed for fluid.    Yes Historical  Provider, MD  gabapentin (NEURONTIN) 300 MG capsule Take 300 mg by mouth daily as needed (for pain).    Yes Historical Provider, MD  HYDROcodone-acetaminophen (NORCO/VICODIN) 5-325 MG per tablet Take 1 tablet by mouth every 6 (six) hours as needed for moderate pain or severe pain. 02/24/15  Yes Ripudeep Krystal Eaton, MD  omeprazole-sodium bicarbonate (ZEGERID) 40-1100 MG per capsule Take 1 capsule by mouth every morning. 02/14/15  Yes Historical Provider, MD  ramipril (ALTACE) 2.5 MG capsule Take 2.5 mg by mouth daily. 02/17/15  Yes Historical Provider, MD  traMADol (ULTRAM-ER) 200 MG 24 hr tablet Take 200 mg by mouth daily as needed for pain.  01/31/15  Yes Historical Provider, MD  Vitamin D, Ergocalciferol, (DRISDOL) 50000 UNITS CAPS capsule Take 50,000 Units by mouth once a week. 02/17/15  Yes Historical Provider, MD  pantoprazole (PROTONIX) 20 MG tablet Take 1 tablet (20 mg total) by mouth daily. 08/27/16   Harlin Heys, MD    Family History Family History  Problem Relation Age of Onset  . Hypertension Mother   . Heart attack Father     Social History Social History  Substance Use Topics  . Smoking status: Never Smoker  . Smokeless tobacco: Never Used  . Alcohol use No     Allergies   Contrast media [iodinated diagnostic agents]; Tinidazole; Baclofen; and Penicillins   Review of Systems Review of Systems  Constitutional: Negative for fever.  HENT: Negative.   Respiratory: Negative for shortness of breath.   Cardiovascular: Positive for chest pain. Negative for leg swelling.  Gastrointestinal: Positive for abdominal pain, nausea and vomiting. Negative for anorexia, constipation and diarrhea.  Genitourinary: Negative for dysuria, vaginal bleeding and vaginal discharge.  Musculoskeletal: Negative.   Skin: Negative.   Allergic/Immunologic: Negative for immunocompromised state.  Neurological: Negative.   Hematological: Does not bruise/bleed easily.  Psychiatric/Behavioral: Negative.        Physical Exam Updated Vital Signs BP 164/83   Pulse 74   Temp 98 F (36.7 C) (Oral)   Resp 19   Ht 5\' 4"  (1.626 m)   Wt 109.2 kg   SpO2 100%   BMI 41.33 kg/m   Physical Exam  Constitutional: She is oriented to person, place, and time. She appears well-developed and well-nourished. No distress.  HENT:  Head: Normocephalic and atraumatic.  Eyes: Conjunctivae and EOM are normal. No scleral icterus.  Neck: Normal range of motion. Neck supple.  Cardiovascular: Normal rate, regular rhythm, normal heart sounds and intact distal pulses.  Exam reveals no gallop and no friction rub.   No murmur heard. Pulmonary/Chest: Effort normal and breath sounds normal. No respiratory distress. She has no wheezes. She has no rales. She exhibits tenderness.  Abdominal: Soft. She exhibits no distension. There is tenderness in the epigastric area, left upper quadrant and  left lower quadrant. There is no rigidity, no rebound, no guarding, no tenderness at McBurney's point and negative Murphy's sign. A hernia is present. Hernia confirmed positive in the left inguinal area.  Musculoskeletal: She exhibits no edema.  Neurological: She is alert and oriented to person, place, and time.  Skin: Skin is warm and dry. Capillary refill takes less than 2 seconds. No rash noted. She is not diaphoretic. No pallor.  Psychiatric: She has a normal mood and affect. Her behavior is normal. Judgment and thought content normal.     ED Treatments / Results  Labs (all labs ordered are listed, but only abnormal results are displayed) Labs Reviewed  COMPREHENSIVE METABOLIC PANEL - Abnormal; Notable for the following:       Result Value   Total Protein 6.1 (*)    AST 13 (*)    ALT 11 (*)    All other components within normal limits  CBC  LIPASE, BLOOD  I-STAT TROPOININ, ED  I-STAT TROPOININ, ED    EKG  EKG Interpretation  Date/Time:  Sunday August 26 2016 19:36:31 EDT Ventricular Rate:  72 PR  Interval:  160 QRS Duration: 84 QT Interval:  408 QTC Calculation: 446 R Axis:   -39 Text Interpretation:  Normal sinus rhythm Left axis deviation Moderate voltage criteria for LVH, may be normal variant Nonspecific ST abnormality Abnormal ECG Confirmed by ZAVITZ MD, JOSHUA [phone]) on 08/26/2016 10:45:49 PM       Radiology Ct Abdomen Pelvis Wo Contrast  Result Date: 08/27/2016 CLINICAL DATA:  75 year old female with left-sided abdominal pain. Left lower quadrant abdominal pain. EXAM: CT ABDOMEN AND PELVIS WITHOUT CONTRAST TECHNIQUE: Multidetector CT imaging of the abdomen and pelvis was performed following the standard protocol without IV contrast. COMPARISON:  Abdominal CT dated 11/10/2015 FINDINGS: Evaluation of this exam is limited in the absence of intravenous contrast. Lower chest: Patchy area hazy density at the right lung base may represent atelectatic changes. Developing infiltrate is not excluded. Clinical correlation is recommended. The visualized left lung base is clear. There is coronary vascular calcification. There is hypoattenuation of the cardiac blood pool suggestive of a degree of anemia. Clinical correlation is recommended. No intra-abdominal free air or free fluid. Hepatobiliary: Cholecystectomy. The liver is unremarkable. No intrahepatic biliary ductal dilatation. Pancreas: Unremarkable. No pancreatic ductal dilatation or surrounding inflammatory changes. Spleen: Normal in size without focal abnormality. Adrenals/Urinary Tract: The adrenal glands appear unremarkable. There is a 6 cm right renal upper pole cyst. There is no hydronephrosis or nephrolithiasis on either side. The urinary bladder appears unremarkable. Stomach/Bowel: There is sigmoid diverticulosis without active inflammatory changes. Moderate stool noted throughout the colon. No evidence of bowel obstruction or active inflammation. Small hiatal hernia. Appendectomy. Vascular/Lymphatic: The abdominal aorta and IVC are  grossly unremarkable on this noncontrast study. No portal venous gas identified. There is no adenopathy. Reproductive: Hysterectomy. Other: Small fat containing umbilical hernia. Upper abdominal ventral hernia repair surgical clips. The abdominal wall soft tissues are otherwise unremarkable. Musculoskeletal: There is degenerative changes of the spine and hips. Grade 1 L4-L5 anterolisthesis. T12-L1 disc disease with posterior spurring and associated mild focal narrowing of the central canal at this level similar to prior CT. No acute fracture. IMPRESSION: No acute intra-abdominal pelvic pathology. No hydronephrosis or nephrolithiasis. Sigmoid diverticulosis without active inflammation. Right lung base atelectatic changes versus less likely developing infiltrate. Electronically Signed   By: Anner Crete M.D.   On: 08/27/2016 00:56   Dg Chest 2 View  Result Date: 08/26/2016  CLINICAL DATA:  Chest and abdominal pain and swelling in the left lower quadrant. EXAM: CHEST  2 VIEW COMPARISON:  11/07/2015 FINDINGS: The cardiac silhouette is upper limits of normal in size, unchanged. Tortuous thoracic aorta is unchanged. The lungs are hypoinflated without evidence of airspace consolidation, edema, pleural effusion, or pneumothorax. There is mild elevation of the right hemidiaphragm which is unchanged. Thoracolumbar disc degeneration and right upper quadrant abdominal surgical clips are noted. IMPRESSION: No active cardiopulmonary disease. Electronically Signed   By: [name] M.D.   On: 08/26/2016 20:52    Procedures Procedures (including critical care time)  Medications Ordered in ED Medications  HYDROcodone-acetaminophen (NORCO/VICODIN) 5-325 MG per tablet 1 tablet (1 tablet Oral Given 08/26/16 2356)     Initial Impression / Assessment and Plan / ED Course  I have reviewed the triage vital signs and the nursing notes.  Pertinent labs & imaging results that were available during my care of the  patient were reviewed by me and considered in my medical decision making (see chart for details).  Clinical Course    Patient presents with two complaints including left-sided chest pain/rib pain worse after eating, along with a month of worsening left lower abdominal pain and "swelling". She is well appearing overall with normal vital signs, no signs of sepsis. Labs unremarkable with low concern for pancreatitis, cholecystitis, biliary obstruction, hepatitis. Exam not consistent with appendicitis. No signs of UTI/pyelonephritis. CT obtained to evaluate for diverticulitis and this was unremarkable. Exam revealed likely inguinal hernia where her symptoms are which is the likely cause. This was easily reducible with no signs of strangulation or incarceration.   Chest pain is most likely due to reflux versus musculoskeletal pain. This is not exertional and not consistent in quality with ACS. EKG is unremarkable with no signs of acute ischemia and is similar to prior. Troponins at 0 and 3 hours were within normal limits, initial trop was detectable but repeat was decreased which indicates low likelihood of ACS. She has no respiratory symptoms and is not hypoxic or tachycardic, do not suspect PE. She has not had recent surgery and is not on estrogen supplements so I consider her low risk. She was given a prescription for Protonix and advised to follow up with PCP. She was given return precautions for worsening symptoms and expressed understanding. She is in good condition for discharge home.  Final Clinical Impressions(s) / ED Diagnoses   Final diagnoses:  Left lower quadrant pain  Hernia of abdominal cavity  Atypical chest pain    New Prescriptions New Prescriptions   PANTOPRAZOLE (PROTONIX) 20 MG TABLET    Take 1 tablet (20 mg total) by mouth daily.     Harlin Heys, MD 08/27/16 0110    Elnora Morrison, MD 08/27/16 [phone]

## 2016-08-26 NOTE — ED Notes (Signed)
EDP at bedside  

## 2016-08-26 NOTE — ED Triage Notes (Signed)
Onset 08-21-16 constant pain underneath left breast and in left groin, nausea, fatigue, and vomited on 08-21-16.  Pt reports left groin area swollen and is now radiating into left leg.

## 2016-08-27 LAB — LIPASE, BLOOD: Lipase: 18 U/L (ref 11–51)

## 2016-08-27 MED ORDER — PANTOPRAZOLE SODIUM 20 MG PO TBEC: 20.0000 mg | DELAYED_RELEASE_TABLET | Freq: Every day | ORAL | 0 refills | Status: DC

## 2016-08-27 NOTE — ED Notes (Signed)
Called radiologist regarding CT results, states results should be back in approx 10 min. EDP aware

## 2017-01-01 ENCOUNTER — Emergency Department (HOSPITAL_BASED_OUTPATIENT_CLINIC_OR_DEPARTMENT_OTHER): Payer: Medicare Other

## 2017-01-01 ENCOUNTER — Emergency Department (HOSPITAL_BASED_OUTPATIENT_CLINIC_OR_DEPARTMENT_OTHER)
Admission: EM | Admit: 2017-01-01 | Discharge: 2017-01-01 | Disposition: A | Discharge status: Home / Self Care | Payer: Medicare Other | Attending: Emergency Medicine | Admitting: Emergency Medicine

## 2017-01-01 ENCOUNTER — Encounter (HOSPITAL_BASED_OUTPATIENT_CLINIC_OR_DEPARTMENT_OTHER): Payer: Self-pay | Admitting: *Deleted

## 2017-01-01 DIAGNOSIS — M79604 Pain in right leg: Secondary | ICD-10-CM

## 2017-01-01 DIAGNOSIS — R42 Dizziness and giddiness: Secondary | ICD-10-CM | POA: Diagnosis not present

## 2017-01-01 DIAGNOSIS — R509 Fever, unspecified: Secondary | ICD-10-CM | POA: Insufficient documentation

## 2017-01-01 DIAGNOSIS — M7989 Other specified soft tissue disorders: Secondary | ICD-10-CM | POA: Diagnosis present

## 2017-01-01 DIAGNOSIS — I10 Essential (primary) hypertension: Secondary | ICD-10-CM | POA: Diagnosis not present

## 2017-01-01 DIAGNOSIS — R109 Unspecified abdominal pain: Secondary | ICD-10-CM | POA: Insufficient documentation

## 2017-01-01 DIAGNOSIS — R0602 Shortness of breath: Secondary | ICD-10-CM | POA: Insufficient documentation

## 2017-01-01 DIAGNOSIS — R51 Headache: Secondary | ICD-10-CM | POA: Insufficient documentation

## 2017-01-01 DIAGNOSIS — Z79899 Other long term (current) drug therapy: Secondary | ICD-10-CM | POA: Diagnosis not present

## 2017-01-01 DIAGNOSIS — R079 Chest pain, unspecified: Secondary | ICD-10-CM | POA: Diagnosis not present

## 2017-01-01 DIAGNOSIS — R112 Nausea with vomiting, unspecified: Secondary | ICD-10-CM | POA: Diagnosis not present

## 2017-01-01 DIAGNOSIS — M549 Dorsalgia, unspecified: Secondary | ICD-10-CM | POA: Diagnosis not present

## 2017-01-01 MED ORDER — HYDROCODONE-ACETAMINOPHEN 5-325 MG PO TABS: 1.0000 | ORAL_TABLET | Freq: Four times a day (QID) | ORAL | 0 refills | Status: DC | PRN

## 2017-01-01 MED ORDER — TRAMADOL HCL 50 MG PO TABS
50.0000 mg | ORAL_TABLET | Freq: Once | ORAL | Status: AC
  Administered 2017-01-01: 50 mg via ORAL
  Filled 2017-01-01: qty 1

## 2017-01-01 NOTE — ED Notes (Signed)
ED Provider at bedside. 

## 2017-01-01 NOTE — Discharge Instructions (Signed)
Doppler studies of both legs without evidence of any blood clots in the veins. The right knee is red and swollen x-rays of that are without any bony abnormalities. Recommend taking hydrocodone as needed for pain and following up with your orthopedic surgeon. Return for any new or worse symptoms.

## 2017-01-01 NOTE — ED Notes (Signed)
Pt c/o back pain after being moved around in xray and [place], MD notified of wanting something for pain

## 2017-01-01 NOTE — ED Triage Notes (Signed)
X 2 days her legs have been swollen and painful. She took her Lasix with no improvement.

## 2017-01-01 NOTE — ED Notes (Signed)
Patient transported to Ultrasound 

## 2017-01-01 NOTE — ED Notes (Signed)
Pt ambulate to restroom

## 2017-01-01 NOTE — ED Provider Notes (Signed)
[facility] Provider Note   CSN: [id number] Arrival date & time: 01/01/17  1721     History   Chief Complaint Chief Complaint  Patient presents with  . Leg Pain    HPI Wendy Carrillo is a 76 y.o. female.  Patient with complaint of bilateral leg swelling and right leg pain patient states that the pain is in the entire leg but mostly around the right knee. No or shortness of breath or chest pain. Patient denies any pain in her low back. Patient has a history of arthritis. 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 No recent injuries or falls.      Past Medical History:  Diagnosis Date  . Arthritis    "all over"  . Chronic lower back pain   . Family history of adverse reaction to anesthesia    "one of my son's passed out"  . Gastritis   . GERD (gastroesophageal reflux disease)   . 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 History of blood transfusion 1992   "related to knee replacement"  . History of duodenal ulcer   . Hypertension   . Spinal stenosis, lumbar     Patient Active Problem List   Diagnosis Date Noted  . Pain in the chest 04/20/2015  . Morbid obesity ([place]) 04/20/2015  . Dyspnea 04/20/2015  . Atypical chest pain 04/20/2015  . GERD (gastroesophageal reflux disease) 02/23/2015  . Essential hypertension 02/23/2015  . Chronic back pain 02/23/2015  . Chest pain 02/22/2015    Past Surgical History:  Procedure Laterality Date  . ABDOMINAL HERNIA REPAIR    . ANTERIOR AND POSTERIOR VAGINAL REPAIR  2015  . APPENDECTOMY  ?1980's  . BLADDER SUSPENSION    . BREAST CYST EXCISION Bilateral   . BREAST SURGERY Left    "nipple taken off"  . CARDIAC CATHETERIZATION    . CARPAL TUNNEL RELEASE Bilateral 2010's  . CATARACT EXTRACTION W/ INTRAOCULAR LENS IMPLANT Left 2014  . CHOLECYSTECTOMY OPEN  1980's  . DILATION AND CURETTAGE OF UTERUS  "several"  . HERNIA REPAIR    . JOINT REPLACEMENT    .  PATELLA FRACTURE SURGERY Left "after 1992"   "after the replacement"  . REDUCTION MAMMAPLASTY Bilateral   . REPLACEMENT TOTAL KNEE Bilateral    "left in 1992; right in 2001"  . VAGINAL HYSTERECTOMY  1972    OB History    No data available       Home Medications    Prior to Admission medications   Medication Sig Start Date End Date Taking? Authorizing Provider  ALPRAZolam (XANAX) 0.25 MG tablet Take 1 tablet (0.25 mg total) by mouth daily as needed. Patient taking differently: Take 0.25 mg by mouth daily as needed for sleep.  02/24/15   Ripudeep Krystal Eaton, MD  celecoxib (CELEBREX) 200 MG capsule Take 200 mg by mouth daily as needed for mild pain.  05/16/10   Historical Provider, MD  cetirizine (ZYRTEC) 10 MG tablet Take 10 mg by mouth daily as needed for allergies.  01/24/15   Historical Provider, MD  cyclobenzaprine (FLEXERIL) 10 MG tablet Take 1 tablet (10 mg total) by mouth 2 (two) times daily as needed for muscle spasms. 11/08/15   Gareth Morgan, MD  furosemide (LASIX) 20 MG tablet Take 20 mg by mouth daily as needed for fluid.     Historical Provider, MD  gabapentin (NEURONTIN) 300 MG capsule Take 300 mg  by mouth daily as needed (for pain).     Historical Provider, MD  HYDROcodone-acetaminophen (NORCO/VICODIN) 5-325 MG per tablet Take 1 tablet by mouth every 6 (six) hours as needed for moderate pain or severe pain. 02/24/15   Ripudeep Krystal Eaton, MD  HYDROcodone-acetaminophen (NORCO/VICODIN) 5-325 MG tablet Take 1-2 tablets by mouth every 6 (six) hours as needed for moderate pain. 01/01/17   Fredia Sorrow, MD  omeprazole-sodium bicarbonate (ZEGERID) 40-1100 MG per capsule Take 1 capsule by mouth every morning. 02/14/15   Historical Provider, MD  pantoprazole (PROTONIX) 20 MG tablet Take 1 tablet (20 mg total) by mouth daily. 08/27/16   Harlin Heys, MD  ramipril (ALTACE) 2.5 MG capsule Take 2.5 mg by mouth daily. 02/17/15   Historical Provider, MD  traMADol (ULTRAM-ER) 200 MG 24 hr tablet Take  200 mg by mouth daily as needed for pain.  01/31/15   Historical Provider, MD  Vitamin D, Ergocalciferol, (DRISDOL) 50000 UNITS CAPS capsule Take 50,000 Units by mouth once a week. 02/17/15   Historical Provider, MD    Family History Family History  Problem Relation Age of Onset  . Hypertension Mother   . Heart attack Father     Social History Social History  Substance Use Topics  . Smoking status: Never Smoker  . Smokeless tobacco: Never Used  . Alcohol use No     Allergies   Contrast media [iodinated diagnostic agents]; Tinidazole; Baclofen; and Penicillins   Review of Systems Review of Systems  Constitutional: Negative for fever.  HENT: Negative for congestion.   Eyes: Negative for redness.  Respiratory: Negative for shortness of breath.   Cardiovascular: Positive for leg swelling. Negative for chest pain.  Gastrointestinal: Negative for abdominal pain.  Genitourinary: Negative for dysuria.  Musculoskeletal: Positive for arthralgias and joint swelling. Negative for back pain.  Skin: Negative for rash.  Neurological: Positive for weakness. Negative for numbness and headaches.  Hematological: Does not bruise/bleed easily.  Psychiatric/Behavioral: Negative for confusion.     Physical Exam Updated Vital Signs BP 111/61   Pulse 67   Temp 98 F (36.7 C) (Oral)   Resp 19   Ht 5' 3.5" (1.613 m)   Wt 108.9 kg   SpO2 95%   BMI 41.85 kg/m   Physical Exam  Constitutional: She is oriented to person, place, and time. She appears well-developed and well-nourished. No distress.  HENT:  Head: Normocephalic and atraumatic.  Mouth/Throat: Oropharynx is clear and moist.  Eyes: Conjunctivae and EOM are normal. Pupils are equal, round, and reactive to light.  Neck: Normal range of motion. Neck supple.  Cardiovascular: Normal rate and regular rhythm.   Pulmonary/Chest: Effort normal and breath sounds normal.  Abdominal: Soft. Bowel sounds are normal. There is no tenderness.    Musculoskeletal: She exhibits edema. She exhibits no tenderness.  Patient with bilateral swelling to both legs no significant edema. Patient's right knee is red in warm without significant effusion some slight swelling. Some pain with range of motion.  Neurological: She is alert and oriented to person, place, and time. No cranial nerve deficit or sensory deficit. She exhibits normal muscle tone. Coordination normal.  Skin: Skin is warm. There is erythema.  Nursing note and vitals reviewed.    ED Treatments / Results  Labs (all labs ordered are listed, but only abnormal results are displayed) Labs Reviewed - No data to display  EKG  EKG Interpretation  Date/Time:  Tuesday January 01 2017 17:41:47 EST Ventricular Rate:  73 PR Interval:  QRS Duration: 96 QT Interval:  425 QTC Calculation: 469 R Axis:   -15 Text Interpretation:  Sinus rhythm Borderline left axis deviation Low voltage, precordial leads Borderline T wave abnormalities No significant change since last tracing Confirmed by Rogene Houston  MD, Terrence Pizana [phone]) on 01/01/2017 6:28:38 PM Also confirmed by Rogene Houston  MD, Owain Eckerman [phone]), editor [place] CT, Leda Gauze [phone])  on 01/02/2017 7:39:18 AM       Radiology No results found.   Results for orders placed or performed during the hospital encounter of 08/26/16  CBC  Result Value Ref Range   WBC 7.7 4.0 - 10.5 K/uL   RBC 4.48 3.87 - 5.11 MIL/uL   Hemoglobin 12.7 12.0 - 15.0 g/dL   HCT 39.4 36.0 - 46.0 %   MCV 87.9 78.0 - 100.0 fL   MCH 28.3 26.0 - 34.0 pg   MCHC 32.2 30.0 - 36.0 g/dL   RDW 13.6 11.5 - 15.5 %   Platelets 228 150 - 400 K/uL  Comprehensive metabolic panel  Result Value Ref Range   Sodium 140 135 - 145 mmol/L   Potassium 3.8 3.5 - 5.1 mmol/L   Chloride 105 101 - 111 mmol/L   CO2 26 22 - 32 mmol/L   Glucose, Bld 99 65 - 99 mg/dL   BUN 7 6 - 20 mg/dL   Creatinine, Ser 0.77 0.44 - 1.00 mg/dL   Calcium 9.2 8.9 - 10.3 mg/dL   Total Protein 6.1 (L) 6.5 - 8.1  g/dL   Albumin 3.7 3.5 - 5.0 g/dL   AST 13 (L) 15 - 41 U/L   ALT 11 (L) 14 - 54 U/L   Alkaline Phosphatase 52 38 - 126 U/L   Total Bilirubin 0.3 0.3 - 1.2 mg/dL   GFR calc non Af Amer >60 >60 mL/min   GFR calc Af Amer >60 >60 mL/min   Anion gap 9 5 - 15  Lipase, blood  Result Value Ref Range   Lipase 18 11 - 51 U/L  I-stat troponin, ED  Result Value Ref Range   Troponin i, poc 0.05 0.00 - 0.08 ng/mL   Comment 3          I-stat troponin, ED  Result Value Ref Range   Troponin i, poc 0.01 0.00 - 0.08 ng/mL   Comment 3           US Venous Img Lower Bilateral  Result Date: 01/01/2017 CLINICAL DATA:  Bilateral leg swelling for 2 days. EXAM: BILATERAL LOWER EXTREMITY VENOUS DOPPLER ULTRASOUND TECHNIQUE: Gray-scale sonography with graded compression, as well as color Doppler and duplex ultrasound were performed to evaluate the lower extremity deep venous systems from the level of the common femoral vein and including the common femoral, femoral, profunda femoral, popliteal and calf veins including the posterior tibial, peroneal and gastrocnemius veins when visible. The superficial great saphenous vein was also interrogated. Spectral Doppler was utilized to evaluate flow at rest and with distal augmentation maneuvers in the common femoral, femoral and popliteal veins. COMPARISON:  None. FINDINGS: RIGHT LOWER EXTREMITY Common Femoral Vein: No evidence of thrombus. Normal compressibility, respiratory phasicity and response to augmentation. Saphenofemoral Junction: No evidence of thrombus. Normal compressibility and flow on color Doppler imaging. Profunda Femoral Vein: No evidence of thrombus. Normal compressibility and flow on color Doppler imaging. Femoral Vein: No evidence of thrombus. Normal compressibility, respiratory phasicity and response to augmentation. Popliteal Vein: No evidence of thrombus. Normal compressibility, respiratory phasicity and response to augmentation. Calf Veins: No evidence of  thrombus. Normal compressibility and flow on color Doppler imaging. Superficial Great Saphenous Vein: No evidence of thrombus. Normal compressibility and flow on color Doppler imaging. Venous Reflux:  None. Other Findings:  None. LEFT LOWER EXTREMITY Common Femoral Vein: No evidence of thrombus. Normal compressibility, respiratory phasicity and response to augmentation. Saphenofemoral Junction: No evidence of thrombus. Normal compressibility and flow on color Doppler imaging. Profunda Femoral Vein: No evidence of thrombus. Normal compressibility and flow on color Doppler imaging. Femoral Vein: No evidence of thrombus. Normal compressibility, respiratory phasicity and response to augmentation. Popliteal Vein: No evidence of thrombus. Normal compressibility, respiratory phasicity and response to augmentation. Calf Veins: No evidence of thrombus. Normal compressibility and flow on color Doppler imaging. Superficial Great Saphenous Vein: No evidence of thrombus. Normal compressibility and flow on color Doppler imaging. Venous Reflux:  None. Other Findings:  None. IMPRESSION: No evidence of bilateral lower extremity deep venous thrombosis. Electronically Signed   By: Jeb Levering M.D.   On: 01/01/2017 20:28   Dg Knee Complete 4 Views Right  Result Date: 01/01/2017 CLINICAL DATA:  Right knee pain and swelling for 2 days without known injury. EXAM: RIGHT KNEE - COMPLETE 4+ VIEW COMPARISON:  Radiographs of Mar 07, 2010. FINDINGS: Status post right total knee arthroplasty. No fracture or dislocation is noted. No soft tissue abnormality is noted. The femoral and tibial components appear to be well situated. IMPRESSION: Status post right total knee arthroplasty. No acute abnormality seen. Electronically Signed   By: Marijo Conception, M.D.   On: 01/01/2017 20:53    Procedures Procedures (including critical care time)  Medications Ordered in ED Medications  traMADol (ULTRAM) tablet 50 mg (50 mg Oral Given 01/01/17  2052)     Initial Impression / Assessment and Plan / ED Course  I have reviewed the triage vital signs and the nursing notes.  Pertinent labs & imaging results that were available during my care of the patient were reviewed by me and considered in my medical decision making (see chart for details).     Doppler studies negative for DVT. X-ray of the right knee without any significant findings. Clinically not suspicious for septic joint at this time. More suggestive of an arthritis. Recommend follow-up with her orthopedic surgeon pain control for now.  Final Clinical Impressions(s) / ED Diagnoses   Final diagnoses:  Right leg pain    New Prescriptions Discharge Medication List as of 01/01/2017  9:38 PM    START taking these medications   Details  !! HYDROcodone-acetaminophen (NORCO/VICODIN) 5-325 MG tablet Take 1-2 tablets by mouth every 6 (six) hours as needed for moderate pain., Starting Tue 01/01/2017, Print     !! - Potential duplicate medications found. Please discuss with provider.       Fredia Sorrow, MD 01/07/17 [phone]

## 2017-01-01 NOTE — ED Provider Notes (Signed)
[facility] Provider Note   CSN: ID:[id number] Arrival date & time: 01/01/17  1721   By signing my name below, I, Eunice Blase, attest that this documentation has been prepared under the direction and in the presence of Fredia Sorrow, MD. Electronically signed, Eunice Blase, ED Scribe. 01/01/17. 7:11 PM.   History   Chief Complaint Chief Complaint  Patient presents with  . Leg Pain   The history is provided by the patient and medical records. No language interpreter was used.   HPI Comments: LILANDRA PALOMEQUE is a 76 y.o. female who presents to the Emergency Department complaining of gradually worsening bilateral leg swelling x 2-3 days. Pt notes the swelling is worse in the right leg, and she adds associated right thigh pain, chest pain, SOB, nausea and dizziness. Pt has taken prescribed lasix and tylenol at home without relief. She further notes she has taken aforementioned lasix every other day x 1 week. Pt notes baseline back pain. She denies recent injury.  Past Medical History:  Diagnosis Date  . Arthritis    "all over"  . Chronic lower back pain   . Family history of adverse reaction to anesthesia    "one of my son's passed out"  . Gastritis   . GERD (gastroesophageal reflux disease)   . 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 History of blood transfusion 1992   "related to knee replacement"  . History of duodenal ulcer   . Hypertension   . Spinal stenosis, lumbar     Patient Active Problem List   Diagnosis Date Noted  . Pain in the chest 04/20/2015  . Morbid obesity ([place]) 04/20/2015  . Dyspnea 04/20/2015  . Atypical chest pain 04/20/2015  . GERD (gastroesophageal reflux disease) 02/23/2015  . Essential hypertension 02/23/2015  . Chronic back pain 02/23/2015  . Chest pain 02/22/2015    Past Surgical History:  Procedure Laterality Date  . ABDOMINAL HERNIA REPAIR    . ANTERIOR AND  POSTERIOR VAGINAL REPAIR  2015  . APPENDECTOMY  ?1980's  . BLADDER SUSPENSION    . BREAST CYST EXCISION Bilateral   . BREAST SURGERY Left    "nipple taken off"  . CARDIAC CATHETERIZATION    . CARPAL TUNNEL RELEASE Bilateral 2010's  . CATARACT EXTRACTION W/ INTRAOCULAR LENS IMPLANT Left 2014  . CHOLECYSTECTOMY OPEN  1980's  . DILATION AND CURETTAGE OF UTERUS  "several"  . HERNIA REPAIR    . JOINT REPLACEMENT    . PATELLA FRACTURE SURGERY Left "after 1992"   "after the replacement"  . REDUCTION MAMMAPLASTY Bilateral   . REPLACEMENT TOTAL KNEE Bilateral    "left in 1992; right in 2001"  . VAGINAL HYSTERECTOMY  1972    OB History    No data available       Home Medications    Prior to Admission medications   Medication Sig Start Date End Date Taking? Authorizing Provider  ALPRAZolam (XANAX) 0.25 MG tablet Take 1 tablet (0.25 mg total) by mouth daily as needed. Patient taking differently: Take 0.25 mg by mouth daily as needed for sleep.  02/24/15   Ripudeep Krystal Eaton, MD  celecoxib (CELEBREX) 200 MG capsule Take 200 mg by mouth daily as needed for mild pain.  05/16/10   Historical Provider, MD  cetirizine (ZYRTEC) 10 MG tablet Take 10 mg by mouth daily as needed for allergies.  01/24/15   Historical Provider, MD  cyclobenzaprine (FLEXERIL) 10 MG tablet Take 1 tablet (10 mg total) by mouth 2 (two) times daily as needed for muscle spasms. 11/08/15   Gareth Morgan, MD  furosemide (LASIX) 20 MG tablet Take 20 mg by mouth daily as needed for fluid.     Historical Provider, MD  gabapentin (NEURONTIN) 300 MG capsule Take 300 mg by mouth daily as needed (for pain).     Historical Provider, MD  HYDROcodone-acetaminophen (NORCO/VICODIN) 5-325 MG per tablet Take 1 tablet by mouth every 6 (six) hours as needed for moderate pain or severe pain. 02/24/15   Ripudeep Krystal Eaton, MD  HYDROcodone-acetaminophen (NORCO/VICODIN) 5-325 MG tablet Take 1-2 tablets by mouth every 6 (six) hours as needed for moderate  pain. 01/01/17   Fredia Sorrow, MD  omeprazole-sodium bicarbonate (ZEGERID) 40-1100 MG per capsule Take 1 capsule by mouth every morning. 02/14/15   Historical Provider, MD  pantoprazole (PROTONIX) 20 MG tablet Take 1 tablet (20 mg total) by mouth daily. 08/27/16   Harlin Heys, MD  ramipril (ALTACE) 2.5 MG capsule Take 2.5 mg by mouth daily. 02/17/15   Historical Provider, MD  traMADol (ULTRAM-ER) 200 MG 24 hr tablet Take 200 mg by mouth daily as needed for pain.  01/31/15   Historical Provider, MD  Vitamin D, Ergocalciferol, (DRISDOL) 50000 UNITS CAPS capsule Take 50,000 Units by mouth once a week. 02/17/15   Historical Provider, MD    Family History Family History  Problem Relation Age of Onset  . Hypertension Mother   . Heart attack Father     Social History Social History  Substance Use Topics  . Smoking status: Never Smoker  . Smokeless tobacco: Never Used  . Alcohol use No     Allergies   Contrast media [iodinated diagnostic agents]; Tinidazole; Baclofen; and Penicillins   Review of Systems Review of Systems  Constitutional: Positive for chills and fever.  HENT: Negative for rhinorrhea and sore throat.   Eyes: Negative for visual disturbance.  Respiratory: Positive for shortness of breath. Negative for cough.   Cardiovascular: Positive for chest pain and leg swelling.  Gastrointestinal: Positive for abdominal pain, nausea and vomiting. Negative for diarrhea.  Genitourinary: Negative for dysuria and hematuria.  Musculoskeletal: Positive for back pain.  Skin: Negative for rash.  Neurological: Positive for dizziness and headaches.  Hematological: Does not bruise/bleed easily.  Psychiatric/Behavioral: Negative for confusion.     Physical Exam Updated Vital Signs BP (!) 124/54   Pulse 72   Temp 98 F (36.7 C) (Oral)   Resp 20   Ht 5' 3.5" (1.613 m)   Wt 240 lb (108.9 kg)   SpO2 99%   BMI 41.85 kg/m   Physical Exam  Constitutional: She is oriented to person,  place, and time. She appears well-developed and well-nourished. No distress.  HENT:  Head: Normocephalic and atraumatic.  Mouth/Throat: Uvula is midline, oropharynx is clear and moist and mucous membranes are normal.  Eyes: Conjunctivae and EOM are normal. Pupils are equal, round, and reactive to light. No scleral icterus.  Neck: Normal range of motion. Neck supple.  Cardiovascular: Normal rate, regular rhythm and normal heart sounds.  Exam reveals no gallop and no friction rub.   No murmur heard. -pitting edema to the ankles;  Pulmonary/Chest: Effort normal and breath sounds normal. She has no wheezes. She has no rales.  SPO2 97% on RA  Abdominal: Soft. Bowel sounds are normal. She exhibits no distension. There is no tenderness.  Musculoskeletal: She exhibits no edema or tenderness.  Bilateral knee scars well healed; Mild warmth and erythema on the right knee with some swelling ,left knee not; Fluid knee left knee not.  Neurological: She is alert and oriented to person, place, and time. No cranial nerve deficit or sensory deficit. She exhibits normal muscle tone. Coordination normal.  Skin: Skin is warm and dry. She is not diaphoretic. There is erythema.  Psychiatric: She has a normal mood and affect. Her behavior is normal.  Nursing note and vitals reviewed.    ED Treatments / Results  DIAGNOSTIC STUDIES: Oxygen Saturation is 99% on RA, normal by my interpretation.    COORDINATION OF CARE: 7:10 PM Discussed treatment plan with pt at bedside and pt agreed to plan. Will order imaging and reassess.  Labs (all labs ordered are listed, but only abnormal results are displayed) Labs Reviewed - No data to display  EKG  EKG Interpretation  Date/Time:  Tuesday January 01 2017 17:41:47 EST Ventricular Rate:  73 PR Interval:    QRS Duration: 96 QT Interval:  425 QTC Calculation: 469 R Axis:   -15 Text Interpretation:  Sinus rhythm Borderline left axis deviation Low voltage, precordial  leads Borderline T wave abnormalities No significant change since last tracing Confirmed by Kaydince Towles  MD, Keirah Konitzer [phone]) on 01/01/2017 6:28:38 PM       Radiology US Venous Img Lower Bilateral  Result Date: 01/01/2017 CLINICAL DATA:  Bilateral leg swelling for 2 days. EXAM: BILATERAL LOWER EXTREMITY VENOUS DOPPLER ULTRASOUND TECHNIQUE: Gray-scale sonography with graded compression, as well as color Doppler and duplex ultrasound were performed to evaluate the lower extremity deep venous systems from the level of the common femoral vein and including the common femoral, femoral, profunda femoral, popliteal and calf veins including the posterior tibial, peroneal and gastrocnemius veins when visible. The superficial great saphenous vein was also interrogated. Spectral Doppler was utilized to evaluate flow at rest and with distal augmentation maneuvers in the common femoral, femoral and popliteal veins. COMPARISON:  None. FINDINGS: RIGHT LOWER EXTREMITY Common Femoral Vein: No evidence of thrombus. Normal compressibility, respiratory phasicity and response to augmentation. Saphenofemoral Junction: No evidence of thrombus. Normal compressibility and flow on color Doppler imaging. Profunda Femoral Vein: No evidence of thrombus. Normal compressibility and flow on color Doppler imaging. Femoral Vein: No evidence of thrombus. Normal compressibility, respiratory phasicity and response to augmentation. Popliteal Vein: No evidence of thrombus. Normal compressibility, respiratory phasicity and response to augmentation. Calf Veins: No evidence of thrombus. Normal compressibility and flow on color Doppler imaging. Superficial Great Saphenous Vein: No evidence of thrombus. Normal compressibility and flow on color Doppler imaging. Venous Reflux:  None. Other Findings:  None. LEFT LOWER EXTREMITY Common Femoral Vein: No evidence of thrombus. Normal compressibility, respiratory phasicity and response to augmentation.  Saphenofemoral Junction: No evidence of thrombus. Normal compressibility and flow on color Doppler imaging. Profunda Femoral Vein: No evidence of thrombus. Normal compressibility and flow on color Doppler imaging. Femoral Vein: No evidence of thrombus. Normal compressibility, respiratory phasicity and response to augmentation. Popliteal Vein: No evidence of thrombus. Normal compressibility, respiratory phasicity and response to augmentation. Calf Veins: No evidence of thrombus. Normal compressibility and flow on color Doppler imaging. Superficial Great Saphenous Vein: No evidence of thrombus. Normal compressibility and flow on color Doppler imaging. Venous Reflux:  None. Other Findings:  None. IMPRESSION: No evidence of bilateral lower extremity deep venous thrombosis. Electronically Signed   By: Jeb Levering M.D.   On: 01/01/2017 20:28   Dg Knee Complete 4 Views Right  Result Date: 01/01/2017 CLINICAL DATA:  Right knee pain and swelling for 2 days without known injury. EXAM: RIGHT KNEE - COMPLETE 4+ VIEW COMPARISON:  Radiographs of Mar 07, 2010. FINDINGS: Status post right total knee arthroplasty. No fracture or dislocation is noted. No soft tissue abnormality is noted. The femoral and tibial components appear to be well situated. IMPRESSION: Status post right total knee arthroplasty. No acute abnormality seen. Electronically Signed   By: Marijo Conception, M.D.   On: 01/01/2017 20:53    Procedures Procedures (including critical care time)  Medications Ordered in ED Medications  traMADol (ULTRAM) tablet 50 mg (50 mg Oral Given 01/01/17 2052)     Initial Impression / Assessment and Plan / ED Course  I have reviewed the triage vital signs and the nursing notes.  Pertinent labs & imaging results that were available during my care of the patient were reviewed by me and considered in my medical decision making (see chart for details).     Workup for the bilateral leg swelling with negative  Dopplers. No evidence of deep vein thrombosis. X-rays of the right knee without any bony or knee replacement at her abdomen bowel. However that knee is red and swollen so there could be some secondary inflammatory arthritis. No real concerns for joint infection based on exam. With close follow-up with orthopedics would be important. We'll increase her normal tramadol pain medicine to hydrocodone.  I personally performed the services described in this documentation, which was scribed in my presence. The recorded information has been reviewed and is accurate.     Final Clinical Impressions(s) / ED Diagnoses   Final diagnoses:  Right leg pain    New Prescriptions New Prescriptions   HYDROCODONE-ACETAMINOPHEN (NORCO/VICODIN) 5-325 MG TABLET    Take 1-2 tablets by mouth every 6 (six) hours as needed for moderate pain.     Fredia Sorrow, MD 01/01/17 2140

## 2017-04-18 DIAGNOSIS — H25811 Combined forms of age-related cataract, right eye: Secondary | ICD-10-CM | POA: Insufficient documentation

## 2017-09-07 ENCOUNTER — Emergency Department (HOSPITAL_BASED_OUTPATIENT_CLINIC_OR_DEPARTMENT_OTHER): Payer: Medicare Other

## 2017-09-07 ENCOUNTER — Emergency Department (HOSPITAL_BASED_OUTPATIENT_CLINIC_OR_DEPARTMENT_OTHER)
Admission: EM | Admit: 2017-09-07 | Discharge: 2017-09-07 | Disposition: A | Discharge status: Home / Self Care | Payer: Medicare Other | Attending: Emergency Medicine | Admitting: Emergency Medicine

## 2017-09-07 ENCOUNTER — Encounter (HOSPITAL_BASED_OUTPATIENT_CLINIC_OR_DEPARTMENT_OTHER): Payer: Self-pay | Admitting: *Deleted

## 2017-09-07 DIAGNOSIS — Z96653 Presence of artificial knee joint, bilateral: Secondary | ICD-10-CM | POA: Insufficient documentation

## 2017-09-07 DIAGNOSIS — Y998 Other external cause status: Secondary | ICD-10-CM | POA: Diagnosis not present

## 2017-09-07 DIAGNOSIS — Z79899 Other long term (current) drug therapy: Secondary | ICD-10-CM | POA: Diagnosis not present

## 2017-09-07 DIAGNOSIS — S39012A Strain of muscle, fascia and tendon of lower back, initial encounter: Secondary | ICD-10-CM | POA: Diagnosis not present

## 2017-09-07 DIAGNOSIS — Y9389 Activity, other specified: Secondary | ICD-10-CM | POA: Insufficient documentation

## 2017-09-07 DIAGNOSIS — I10 Essential (primary) hypertension: Secondary | ICD-10-CM | POA: Insufficient documentation

## 2017-09-07 DIAGNOSIS — Y92410 Unspecified street and highway as the place of occurrence of the external cause: Secondary | ICD-10-CM | POA: Insufficient documentation

## 2017-09-07 DIAGNOSIS — S3992XA Unspecified injury of lower back, initial encounter: Secondary | ICD-10-CM | POA: Diagnosis present

## 2017-09-07 MED ORDER — ACETAMINOPHEN 325 MG PO TABS
650.0000 mg | ORAL_TABLET | Freq: Once | ORAL | Status: AC
  Administered 2017-09-07: 650 mg via ORAL
  Filled 2017-09-07: qty 2

## 2017-09-07 MED ORDER — IBUPROFEN 400 MG PO TABS
400.0000 mg | ORAL_TABLET | Freq: Once | ORAL | Status: AC
  Administered 2017-09-07: 400 mg via ORAL
  Filled 2017-09-07: qty 1

## 2017-09-07 NOTE — Discharge Instructions (Signed)
It was our pleasure to provide your ER care today - we hope that you feel better.  Your xrays look good/no fracture or acute injury is seen.  Take motrin or aleve as need for pain.  Follow up with primary care doctor in 1 week if symptoms fail to improve/resolve.
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
## 2017-09-07 NOTE — ED Notes (Signed)
Patient transported to X-ray 
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
## 2017-10-02 ENCOUNTER — Telehealth: Payer: Self-pay | Admitting: Cardiology

## 2017-10-02 NOTE — Telephone Encounter (Signed)
ok 

## 2017-10-02 NOTE — Telephone Encounter (Signed)
This is fine with me. Candee Furbish, MD

## 2017-10-02 NOTE — Telephone Encounter (Signed)
error 

## 2017-10-02 NOTE — Telephone Encounter (Signed)
New message    Patient request to become new patient for Dr. Oval Linsey. Patient last saw Dr Marlou Porch in 2016. Please confirm this change is okay.

## 2017-10-20 NOTE — H&P (View-Only) (Signed)
Cardiology Office Note   Date:  10/21/2017   ID:  Wendy Carrillo, DOB 07/20/1941, MRN [id number]  PCP:  Wendy Panda, MD  Cardiologist:   Wendy Latch, MD   Chief Complaint  Patient presents with  . Shortness of Breath     History of Present Illness: Wendy Carrillo is a 76 y.o. female with hypertension, morbid obesity, and coronary artery calcification who presents for an evaluation of chest pain and presyncope.  A few weeks ago she started having intermittent chest pain.  The episodes occur both at rest and with exertion.  Sometimes her symptoms are so severe that she has to stop cooking.  She noted them while walking as well.  The pain is 5/10 and mostly under her left breast.  It is associated with shortness of breath and nausea.  These episodes are not associated with diaphoresis.  However she also has episodes of transient lightheadedness and diaphoresis to the point where she feels like she is going to pass out.  This typically occurs when standing.  She had 2 episodes yesterday.  The first occurred during her morning church service.  She went to go from sitting to standing and felt lightheaded and dizzy.  She got hot and flushed all over.  Upon sitting down the symptoms subsided.  That morning she had eaten only crackers and half a glass of orange juice.  However she had a full lunch and dinner.  She had recurrent symptoms in her evening service that were much worse. Her blood pressure at the time was 147/84.  She has not had any recent syncope but did pass out several years ago.  Wendy Carrillo also notes that she has GERD that is poorly-controlled.  She sometimes wakes up with chest pain, though this is slightly different than the pain she has with exertion.  She had a CT of the abdomen at [facility] 09/2017 that noted coronary artery calcifications.  She reports having a heart catheterization at [facility] 8 years ago.  She is unsure what they have found but did not  need any stents.  Wendy Carrillo does not get much formal exercise.  She walks with shopping.  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  She has some lower extremity edema that improves with elevation of her legs.  She reportss orthopnea that has been ongoing for several months.  She sleeps with the head of her bed elevated both for comfort and because of shortness of breath.  Wendy Carrillo was previously a patient of Dr. Marlou Porch.  She last saw him 04/2015 for chest pain.  She had recently been hospitalized with pleuritic, left-sided chest pain.  D-dimer was elevated and V/Q scan and lower extremity Dopplers were unremarkable.  Her chest pain was thought to be very atypical and did not occur with exertion.  She had a [employer] 05/2015 that revealed LVEF 51% and no ischemia.  She also had an echo 05/05/15 that revealed LVEF 50-55%.   Past Medical History:  Diagnosis Date  . Arthritis    "all over"  . Chronic lower back pain   . Coronary artery calcification 10/21/2017  . Family history of adverse reaction to anesthesia    "one of my son's passed out"  . Gastritis   . GERD (gastroesophageal reflux disease)   . Hepatitis ~ 1960   "don't remember which kind; I stuck a needle in my finger in  nursing school"  . History of blood transfusion 1992   "related to knee replacement"  . History of duodenal ulcer   . Hypertension   . Spinal stenosis, lumbar     Past Surgical History:  Procedure Laterality Date  . ABDOMINAL HERNIA REPAIR    . ANTERIOR AND POSTERIOR VAGINAL REPAIR  2015  . APPENDECTOMY  ?1980's  . BLADDER SUSPENSION    . BREAST CYST EXCISION Bilateral   . BREAST SURGERY Left    "nipple taken off"  . CARDIAC CATHETERIZATION    . CARPAL TUNNEL RELEASE Bilateral 2010's  . CATARACT EXTRACTION W/ INTRAOCULAR LENS IMPLANT Left 2014  . CHOLECYSTECTOMY OPEN  1980's  . DILATION AND CURETTAGE OF UTERUS  "several"  . HERNIA REPAIR    . JOINT  REPLACEMENT    . PATELLA FRACTURE SURGERY Left "after 1992"   "after the replacement"  . REDUCTION MAMMAPLASTY Bilateral   . REPLACEMENT TOTAL KNEE Bilateral    "left in 1992; right in 2001"  . VAGINAL HYSTERECTOMY  1972     Current Outpatient Medications  Medication Sig Dispense Refill  . ALPRAZolam (XANAX) 0.25 MG tablet Take 1 tablet (0.25 mg total) by mouth daily as needed. (Patient taking differently: Take 0.25 mg by mouth daily as needed for sleep. ) 30 tablet 0  . aspirin EC 81 MG tablet Take 81 mg by mouth daily.    . celecoxib (CELEBREX) 200 MG capsule Take 200 mg by mouth daily as needed for mild pain.     . cetirizine (ZYRTEC) 10 MG tablet Take 10 mg by mouth daily as needed for allergies.   5  . cyclobenzaprine (FLEXERIL) 10 MG tablet Take 1 tablet (10 mg total) by mouth 2 (two) times daily as needed for muscle spasms. 20 tablet 0  . furosemide (LASIX) 20 MG tablet Take 20 mg by mouth daily as needed for fluid.     Marland Kitchen gabapentin (NEURONTIN) 300 MG capsule Take 300 mg by mouth daily as needed (for pain).     Marland Kitchen omeprazole-sodium bicarbonate (ZEGERID) 40-1100 MG per capsule Take 1 capsule by mouth every morning.  11  . pantoprazole (PROTONIX) 20 MG tablet Take 1 tablet (20 mg total) by mouth daily. 30 tablet 0  . ramipril (ALTACE) 2.5 MG capsule Take 2.5 mg by mouth daily.  5  . traMADol (ULTRAM-ER) 200 MG 24 hr tablet Take 200 mg by mouth daily as needed for pain.   0  . Vitamin D, Ergocalciferol, (DRISDOL) 50000 UNITS CAPS capsule Take 50,000 Units by mouth once a week.  5  . predniSONE (DELTASONE) 50 MG tablet TAKE 1 TABLET BY MOUTH EVERY 6 HOURS PRIOR TO CATH WITH LAST DOSE BEING 2 HOURS PRIOR 3 tablet 0   No current facility-administered medications for this visit.     Allergies:   Contrast media [iodinated diagnostic agents]; Tinidazole; Baclofen; and Penicillins    Social History:  The patient  reports that  has never smoked. she has never used smokeless tobacco. She  reports that she does not drink alcohol or use drugs.   Family History:  The patient's family history includes Breast cancer in her sister, sister, and sister; Diabetes in her mother; Heart attack in her father; Heart disease in her brother; Hypertension in her mother; Leukemia in her brother; Stroke in her mother.    ROS:  Please see the history of present illness.   Otherwise, review of systems are positive for none.   All other systems are  reviewed and negative.    PHYSICAL EXAM: VS:  BP 138/62   Pulse 61   Ht 5\' 4"  (1.626 m)   Wt 237 lb 3.2 oz (107.6 kg)   BMI 40.72 kg/m  , BMI Body mass index is 40.72 kg/m. GENERAL:  Well appearing HEENT:  Pupils equal round and reactive, fundi not visualized, oral mucosa unremarkable NECK:  No jugular venous distention, waveform within normal limits, carotid upstroke brisk and symmetric, no bruits, no thyromegaly LYMPHATICS:  No cervical adenopathy LUNGS:  Clear to auscultation bilaterally HEART:  RRR.  PMI not displaced or sustained,S1 and S2 within normal limits, no S3, no S4, no clicks, no rubs, no murmurs ABD:  Flat, positive bowel sounds normal in frequency in pitch, no bruits, no rebound, no guarding, no midline pulsatile mass, no hepatomegaly, no splenomegaly EXT:  2 plus pulses throughout, no edema, no cyanosis no clubbing SKIN:  No rashes no nodules NEURO:  Cranial nerves II through XII grossly intact, motor grossly intact throughout PSYCH:  Cognitively intact, oriented to person place and time   EKG:  EKG is ordered today. The ekg ordered today demonstrates sinus rhythm.  Rate 61 bpm.  Left anterior fascicular block.  Poor R wave progression.  Nonspecific ST/T changes.  Lexiscan Myoview 05/2015:  The left ventricular ejection fraction is mildly decreased (45-54%).  Nuclear stress EF: 51%.  There was no ST segment deviation noted during stress.  This is a low risk study.   Low risk study.  No evidence for ischemia or  infarction.  EF 51% with diffuse hypokinesis, would consider confirming mildly decreased EF by echo.   Echo 05/05/15: Study Conclusions  - Left ventricle: The cavity size was normal. Systolic function was   normal. The estimated ejection fraction was in the range of 50%   to 55%. Wall motion was normal; there were no regional wall   motion abnormalities. - Aortic valve: Trileaflet; normal thickness, mildly calcified   leaflets. - Mitral valve: There was trivial regurgitation.   Recent Labs: No results found for requested labs within last 8760 hours.    Lipid Panel No results found for: CHOL, TRIG, HDL, CHOLHDL, VLDL, LDLCALC, LDLDIRECT    Wt Readings from Last 3 Encounters:  10/21/17 237 lb 3.2 oz (107.6 kg)  09/07/17 238 lb (108 kg)  01/01/17 240 lb (108.9 kg)      ASSESSMENT AND PLAN:  # Chest pain: # Coronary artery calcification: # Shortness of breath:  Ms. Freund has chest pain concerning for ischemia.  She also has no coronary artery calcifications.  We will get a left heart catheterization to assess for obstructive coronary artery disease.  We will also start aspirin 81 mg daily.  She has a history of contrast allergy and will receive pretreatment.  We will get a copy of her lipids from her PCP and likely start a statin.  We will also get an echocardiogram.  # Hypertension: BP has been running high.  She has been feeling dizzy and presyncopal.  We will not increase her antihypertensives for now.  She was not orthostatic on exam.  Based on her cardiac catheterization findings she may need a beta-blocker instead of just the ACE inhibitor.  # Morbid obesity: After her cardiac catheterization we will discussed the importance of exercise and weight loss.    Current medicines are reviewed at length with the patient today.  The patient does not have concerns regarding medicines.  The following changes have been made:  Start  aspirin 81 mg  Labs/ tests ordered today  include:   Orders Placed This Encounter  Procedures  . CBC with Differential/Platelet  . Basic metabolic panel  . INR/PT  . EKG 12-Lead  . ECHOCARDIOGRAM COMPLETE     Disposition:   FU with Zhion Pevehouse C. Oval Linsey, MD, [facility] in 1 month    This note was written with the assistance of speech recognition software.  Please excuse any transcriptional errors.  Signed, Audry Pecina C. Oval Linsey, MD, [facility]  10/21/2017 1:16 PM    [facility]

## 2017-10-20 NOTE — Progress Notes (Signed)
Cardiology Office Note   Date:  10/21/2017   ID:  GUDELIA EUGENE, DOB [birth year], MRN [id number]  PCP:  Jilda Panda, MD  Cardiologist:   Skeet Latch, MD   Chief Complaint  Patient presents with  . Shortness of Breath     History of Present Illness: PHILLIS THACKERAY is a 76 y.o. female with hypertension, morbid obesity, and coronary artery calcification who presents for an evaluation of chest pain and presyncope.  A few weeks ago she started having intermittent chest pain.  The episodes occur both at rest and with exertion.  Sometimes her symptoms are so severe that she has to stop cooking.  She noted them while walking as well.  The pain is 5/10 and mostly under her left breast.  It is associated with shortness of breath and nausea.  These episodes are not associated with diaphoresis.  However she also has episodes of transient lightheadedness and diaphoresis to the point where she feels like she is going to pass out.  This typically occurs when standing.  She had 2 episodes yesterday.  The first occurred during her morning church service.  She went to go from sitting to standing and felt lightheaded and dizzy.  She got hot and flushed all over.  Upon sitting down the symptoms subsided.  That morning she had eaten only crackers and half a glass of orange juice.  However she had a full lunch and dinner.  She had recurrent symptoms in her evening service that were much worse. Her blood pressure at the time was 147/84.  She has not had any recent syncope but did pass out several years ago.  Ms. Rios also notes that she has GERD that is poorly-controlled.  She sometimes wakes up with chest pain, though this is slightly different than the pain she has with exertion.  She had a CT of the abdomen at [facility] 09/2017 that noted coronary artery calcifications.  She reports having a heart catheterization at [facility] 8 years ago.  She is unsure what they have found but did not  need any stents.  Ms. Anthis does not get much formal exercise.  She walks with shopping.  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  She has some lower extremity edema that improves with elevation of her legs.  She reportss orthopnea that has been ongoing for several months.  She sleeps with the head of her bed elevated both for comfort and because of shortness of breath.  Ms. Schneck was previously a patient of Dr. Marlou Porch.  She last saw him 04/2015 for chest pain.  She had recently been hospitalized with pleuritic, left-sided chest pain.  D-dimer was elevated and V/Q scan and lower extremity Dopplers were unremarkable.  Her chest pain was thought to be very atypical and did not occur with exertion.  She had a [employer] 05/2015 that revealed LVEF 51% and no ischemia.  She also had an echo 05/05/15 that revealed LVEF 50-55%.   Past Medical History:  Diagnosis Date  . Arthritis    "all over"  . Chronic lower back pain   . Coronary artery calcification 10/21/2017  . Family history of adverse reaction to anesthesia    "one of my son's passed out"  . Gastritis   . GERD (gastroesophageal reflux disease)   . Hepatitis ~ 1960   "don't remember which kind; I stuck a needle in my finger in  nursing school"  . History of blood transfusion 1992   "related to knee replacement"  . History of duodenal ulcer   . Hypertension   . Spinal stenosis, lumbar     Past Surgical History:  Procedure Laterality Date  . ABDOMINAL HERNIA REPAIR    . ANTERIOR AND POSTERIOR VAGINAL REPAIR  2015  . APPENDECTOMY  ?1980's  . BLADDER SUSPENSION    . BREAST CYST EXCISION Bilateral   . BREAST SURGERY Left    "nipple taken off"  . CARDIAC CATHETERIZATION    . CARPAL TUNNEL RELEASE Bilateral 2010's  . CATARACT EXTRACTION W/ INTRAOCULAR LENS IMPLANT Left 2014  . CHOLECYSTECTOMY OPEN  1980's  . DILATION AND CURETTAGE OF UTERUS  "several"  . HERNIA REPAIR    . JOINT  REPLACEMENT    . PATELLA FRACTURE SURGERY Left "after 1992"   "after the replacement"  . REDUCTION MAMMAPLASTY Bilateral   . REPLACEMENT TOTAL KNEE Bilateral    "left in 1992; right in 2001"  . VAGINAL HYSTERECTOMY  1972     Current Outpatient Medications  Medication Sig Dispense Refill  . ALPRAZolam (XANAX) 0.25 MG tablet Take 1 tablet (0.25 mg total) by mouth daily as needed. (Patient taking differently: Take 0.25 mg by mouth daily as needed for sleep. ) 30 tablet 0  . aspirin EC 81 MG tablet Take 81 mg by mouth daily.    . celecoxib (CELEBREX) 200 MG capsule Take 200 mg by mouth daily as needed for mild pain.     . cetirizine (ZYRTEC) 10 MG tablet Take 10 mg by mouth daily as needed for allergies.   5  . cyclobenzaprine (FLEXERIL) 10 MG tablet Take 1 tablet (10 mg total) by mouth 2 (two) times daily as needed for muscle spasms. 20 tablet 0  . furosemide (LASIX) 20 MG tablet Take 20 mg by mouth daily as needed for fluid.     Marland Kitchen gabapentin (NEURONTIN) 300 MG capsule Take 300 mg by mouth daily as needed (for pain).     Marland Kitchen omeprazole-sodium bicarbonate (ZEGERID) 40-1100 MG per capsule Take 1 capsule by mouth every morning.  11  . pantoprazole (PROTONIX) 20 MG tablet Take 1 tablet (20 mg total) by mouth daily. 30 tablet 0  . ramipril (ALTACE) 2.5 MG capsule Take 2.5 mg by mouth daily.  5  . traMADol (ULTRAM-ER) 200 MG 24 hr tablet Take 200 mg by mouth daily as needed for pain.   0  . Vitamin D, Ergocalciferol, (DRISDOL) 50000 UNITS CAPS capsule Take 50,000 Units by mouth once a week.  5  . predniSONE (DELTASONE) 50 MG tablet TAKE 1 TABLET BY MOUTH EVERY 6 HOURS PRIOR TO CATH WITH LAST DOSE BEING 2 HOURS PRIOR 3 tablet 0   No current facility-administered medications for this visit.     Allergies:   Contrast media [iodinated diagnostic agents]; Tinidazole; Baclofen; and Penicillins    Social History:  The patient  reports that  has never smoked. she has never used smokeless tobacco. She  reports that she does not drink alcohol or use drugs.   Family History:  The patient's family history includes Breast cancer in her sister, sister, and sister; Diabetes in her mother; Heart attack in her father; Heart disease in her brother; Hypertension in her mother; Leukemia in her brother; Stroke in her mother.    ROS:  Please see the history of present illness.   Otherwise, review of systems are positive for none.   All other systems are  reviewed and negative.    PHYSICAL EXAM: VS:  BP 138/62   Pulse 61   Ht 5\' 4"  (1.626 m)   Wt 237 lb 3.2 oz (107.6 kg)   BMI 40.72 kg/m  , BMI Body mass index is 40.72 kg/m. GENERAL:  Well appearing HEENT:  Pupils equal round and reactive, fundi not visualized, oral mucosa unremarkable NECK:  No jugular venous distention, waveform within normal limits, carotid upstroke brisk and symmetric, no bruits, no thyromegaly LYMPHATICS:  No cervical adenopathy LUNGS:  Clear to auscultation bilaterally HEART:  RRR.  PMI not displaced or sustained,S1 and S2 within normal limits, no S3, no S4, no clicks, no rubs, no murmurs ABD:  Flat, positive bowel sounds normal in frequency in pitch, no bruits, no rebound, no guarding, no midline pulsatile mass, no hepatomegaly, no splenomegaly EXT:  2 plus pulses throughout, no edema, no cyanosis no clubbing SKIN:  No rashes no nodules NEURO:  Cranial nerves II through XII grossly intact, motor grossly intact throughout PSYCH:  Cognitively intact, oriented to person place and time   EKG:  EKG is ordered today. The ekg ordered today demonstrates sinus rhythm.  Rate 61 bpm.  Left anterior fascicular block.  Poor R wave progression.  Nonspecific ST/T changes.  Lexiscan Myoview 05/2015:  The left ventricular ejection fraction is mildly decreased (45-54%).  Nuclear stress EF: 51%.  There was no ST segment deviation noted during stress.  This is a low risk study.   Low risk study.  No evidence for ischemia or  infarction.  EF 51% with diffuse hypokinesis, would consider confirming mildly decreased EF by echo.   Echo 05/05/15: Study Conclusions  - Left ventricle: The cavity size was normal. Systolic function was   normal. The estimated ejection fraction was in the range of 50%   to 55%. Wall motion was normal; there were no regional wall   motion abnormalities. - Aortic valve: Trileaflet; normal thickness, mildly calcified   leaflets. - Mitral valve: There was trivial regurgitation.   Recent Labs: No results found for requested labs within last 8760 hours.    Lipid Panel No results found for: CHOL, TRIG, HDL, CHOLHDL, VLDL, LDLCALC, LDLDIRECT    Wt Readings from Last 3 Encounters:  10/21/17 237 lb 3.2 oz (107.6 kg)  09/07/17 238 lb (108 kg)  01/01/17 240 lb (108.9 kg)      ASSESSMENT AND PLAN:  # Chest pain: # Coronary artery calcification: # Shortness of breath:  Ms. Sethi has chest pain concerning for ischemia.  She also has no coronary artery calcifications.  We will get a left heart catheterization to assess for obstructive coronary artery disease.  We will also start aspirin 81 mg daily.  She has a history of contrast allergy and will receive pretreatment.  We will get a copy of her lipids from her PCP and likely start a statin.  We will also get an echocardiogram.  # Hypertension: BP has been running high.  She has been feeling dizzy and presyncopal.  We will not increase her antihypertensives for now.  She was not orthostatic on exam.  Based on her cardiac catheterization findings she may need a beta-blocker instead of just the ACE inhibitor.  # Morbid obesity: After her cardiac catheterization we will discussed the importance of exercise and weight loss.    Current medicines are reviewed at length with the patient today.  The patient does not have concerns regarding medicines.  The following changes have been made:  Start  aspirin 81 mg  Labs/ tests ordered today  include:   Orders Placed This Encounter  Procedures  . CBC with Differential/Platelet  . Basic metabolic panel  . INR/PT  . EKG 12-Lead  . ECHOCARDIOGRAM COMPLETE     Disposition:   FU with Lulubelle Simcoe C. Oval Linsey, MD, [facility] in 1 month    This note was written with the assistance of speech recognition software.  Please excuse any transcriptional errors.  Signed, Jamin Humphries C. Oval Linsey, MD, [facility]  10/21/2017 1:16 PM    [facility]

## 2017-10-21 ENCOUNTER — Encounter: Payer: Self-pay | Admitting: Cardiovascular Disease

## 2017-10-21 ENCOUNTER — Ambulatory Visit (INDEPENDENT_AMBULATORY_CARE_PROVIDER_SITE_OTHER): Payer: Medicare Other | Admitting: Cardiovascular Disease

## 2017-10-21 VITALS — BP 138/62 | HR 61 | Ht 64.0 in | Wt 237.2 lb

## 2017-10-21 DIAGNOSIS — I10 Essential (primary) hypertension: Secondary | ICD-10-CM | POA: Diagnosis not present

## 2017-10-21 DIAGNOSIS — R0602 Shortness of breath: Secondary | ICD-10-CM

## 2017-10-21 DIAGNOSIS — I209 Angina pectoris, unspecified: Secondary | ICD-10-CM | POA: Diagnosis not present

## 2017-10-21 DIAGNOSIS — R609 Edema, unspecified: Secondary | ICD-10-CM

## 2017-10-21 DIAGNOSIS — I2510 Atherosclerotic heart disease of native coronary artery without angina pectoris: Secondary | ICD-10-CM

## 2017-10-21 DIAGNOSIS — I2584 Coronary atherosclerosis due to calcified coronary lesion: Secondary | ICD-10-CM | POA: Diagnosis not present

## 2017-10-21 HISTORY — DX: Atherosclerotic heart disease of native coronary artery without angina pectoris: I25.10

## 2017-10-21 MED ORDER — PREDNISONE 50 MG PO TABS: ORAL_TABLET | ORAL | 0 refills | Status: DC

## 2017-10-21 NOTE — Patient Instructions (Addendum)
Medication Instructions:  START ASPIRIN 81 MG DAILY   TAKE THE PREDNISONE 50 MG EVERY 6 HOURS TIMES 3 DOSES WITH LAST DOSE 2 HOURS PRIOR TO CATH BENADRYL 50 MG WITH THE LAST DOSE OF PREDNISONE   Labwork: BMET/CBC/PT/INR TODAY   Testing/Procedures: Your physician has requested that you have a cardiac catheterization. Cardiac catheterization is used to diagnose and/or treat various heart conditions. Doctors may recommend this procedure for a number of different reasons. The most common reason is to evaluate chest pain. Chest pain can be a symptom of coronary artery disease (CAD), and cardiac catheterization can show whether plaque is narrowing or blocking your heart's arteries. This procedure is also used to evaluate the valves, as well as measure the blood flow and oxygen levels in different parts of your heart. For further information please visit [email or web address]. Please follow instruction sheet, as given.  Your physician has requested that you have an echocardiogram. Echocardiography is a painless test that uses sound waves to create images of your heart. It provides your doctor with information about the size and shape of your heart and how well your heart's chambers and valves are working. This procedure takes approximately one hour. There are no restrictions for this procedure. [address]  Follow-Up: Your physician recommends that you schedule a follow-up appointment in: [place] [address] [place] [address] Dept: [phone] Loc: [place]  10/21/2017  You are scheduled for a Cardiac Catheterization on Wednesday, December 19 with Dr. Shelva Majestic.  1. Please arrive at the [facility] (Main Entrance A) at [facility]: [place],  [address] at 9:00 AM (two and a half hours  before your procedure to ensure your preparation). Free valet parking service is available.   Special note: Every effort is made to have your procedure done on time. Please understand that emergencies sometimes delay scheduled procedures.  2. Diet: Do not eat or drink anything after midnight prior to your procedure except sips of water to take medications.   3. Labs: TODAY IN THE OFFICE   4. Medication instructions in preparation for your procedure:  NO FUROSEMIDE OR RAMIPRIL DAY OF PROCEDURE   On the morning of your procedure, take your Aspirin and any morning medicines NOT listed above.  You may use sips of water.  5. Plan for one night stay--bring personal belongings. 6. Bring a current list of your medications and current insurance cards. 7. You MUST have a responsible person to drive you home. 8. Someone MUST be with you the first 24 hours after you arrive home or your discharge will be delayed. 9. Please wear clothes that are easy to get on and off and wear slip-on shoes.  Thank you for allowing [place] to care for you!   -- [facility]

## 2017-10-22 ENCOUNTER — Telehealth: Payer: Self-pay

## 2017-10-22 LAB — CBC WITH DIFFERENTIAL/PLATELET
BASOS ABS: 0 10*3/uL (ref 0.0–0.2)
Basos: 0 %
EOS (ABSOLUTE): 0.1 10*3/uL (ref 0.0–0.4)
EOS: 2 %
HEMATOCRIT: 41.4 % (ref 34.0–46.6)
Hemoglobin: 13 g/dL (ref 11.1–15.9)
IMMATURE GRANULOCYTES: 0 %
Immature Grans (Abs): 0 10*3/uL (ref 0.0–0.1)
Lymphocytes Absolute: 1.8 10*3/uL (ref 0.7–3.1)
Lymphs: 30 %
MCH: 28.4 pg (ref 26.6–33.0)
MCHC: 31.4 g/dL — ABNORMAL LOW (ref 31.5–35.7)
MCV: 91 fL (ref 79–97)
MONOS ABS: 0.6 10*3/uL (ref 0.1–0.9)
Monocytes: 10 %
NEUTROS PCT: 58 %
Neutrophils Absolute: 3.6 10*3/uL (ref 1.4–7.0)
PLATELETS: 239 10*3/uL (ref 150–379)
RBC: 4.57 x10E6/uL (ref 3.77–5.28)
RDW: 14.9 % (ref 12.3–15.4)
WBC: 6.2 10*3/uL (ref 3.4–10.8)

## 2017-10-22 LAB — BASIC METABOLIC PANEL
BUN/Creatinine Ratio: 18 (ref 12–28)
BUN: 14 mg/dL (ref 8–27)
CALCIUM: 9.3 mg/dL (ref 8.7–10.3)
CHLORIDE: 104 mmol/L (ref 96–106)
CO2: 24 mmol/L (ref 20–29)
Creatinine, Ser: 0.77 mg/dL (ref 0.57–1.00)
GFR calc Af Amer: 86 mL/min/{1.73_m2} (ref >59)
GFR calc non Af Amer: 75 mL/min/{1.73_m2} (ref >59)
GLUCOSE: 85 mg/dL (ref 65–99)
POTASSIUM: 4.6 mmol/L (ref 3.5–5.2)
Sodium: 142 mmol/L (ref 134–144)

## 2017-10-22 LAB — PROTIME-INR
INR: 1 (ref 0.8–1.2)
PROTHROMBIN TIME: 10.4 s (ref 9.1–12.0)

## 2017-10-22 NOTE — Telephone Encounter (Signed)
Generic message left for Pt requesting call back d/t no DPR on file.   Pt with dye allergy-need to confirm medication instructions. This nurse name and # left for call back.

## 2017-10-23 ENCOUNTER — Ambulatory Visit (HOSPITAL_COMMUNITY)
Admission: RE | Admit: 2017-10-23 | Discharge: 2017-10-23 | Disposition: A | Discharge status: Home / Self Care | Payer: Medicare Other | Source: Ambulatory Visit | Attending: Cardiovascular Disease | Admitting: Cardiovascular Disease

## 2017-10-23 ENCOUNTER — Encounter (HOSPITAL_COMMUNITY)
Admission: RE | Disposition: A | Discharge status: Home / Self Care | Payer: Self-pay | Source: Ambulatory Visit | Attending: Cardiovascular Disease

## 2017-10-23 DIAGNOSIS — Z96653 Presence of artificial knee joint, bilateral: Secondary | ICD-10-CM | POA: Insufficient documentation

## 2017-10-23 DIAGNOSIS — Z7982 Long term (current) use of aspirin: Secondary | ICD-10-CM | POA: Insufficient documentation

## 2017-10-23 DIAGNOSIS — Z8249 Family history of ischemic heart disease and other diseases of the circulatory system: Secondary | ICD-10-CM | POA: Diagnosis not present

## 2017-10-23 DIAGNOSIS — I10 Essential (primary) hypertension: Secondary | ICD-10-CM | POA: Diagnosis not present

## 2017-10-23 DIAGNOSIS — I25118 Atherosclerotic heart disease of native coronary artery with other forms of angina pectoris: Secondary | ICD-10-CM

## 2017-10-23 DIAGNOSIS — Z791 Long term (current) use of non-steroidal anti-inflammatories (NSAID): Secondary | ICD-10-CM | POA: Diagnosis not present

## 2017-10-23 DIAGNOSIS — K219 Gastro-esophageal reflux disease without esophagitis: Secondary | ICD-10-CM | POA: Insufficient documentation

## 2017-10-23 DIAGNOSIS — Z6841 Body Mass Index (BMI) 40.0 and over, adult: Secondary | ICD-10-CM | POA: Diagnosis not present

## 2017-10-23 DIAGNOSIS — Z79899 Other long term (current) drug therapy: Secondary | ICD-10-CM | POA: Diagnosis not present

## 2017-10-23 DIAGNOSIS — I209 Angina pectoris, unspecified: Secondary | ICD-10-CM

## 2017-10-23 DIAGNOSIS — I2510 Atherosclerotic heart disease of native coronary artery without angina pectoris: Secondary | ICD-10-CM | POA: Insufficient documentation

## 2017-10-23 DIAGNOSIS — I2584 Coronary atherosclerosis due to calcified coronary lesion: Secondary | ICD-10-CM | POA: Diagnosis not present

## 2017-10-23 DIAGNOSIS — R079 Chest pain, unspecified: Secondary | ICD-10-CM | POA: Diagnosis present

## 2017-10-23 HISTORY — PX: LEFT HEART CATH AND CORONARY ANGIOGRAPHY: CATH118249

## 2017-10-23 SURGERY — LEFT HEART CATH AND CORONARY ANGIOGRAPHY
Anesthesia: LOCAL

## 2017-10-23 MED ORDER — MIDAZOLAM HCL 2 MG/2ML IJ SOLN
INTRAMUSCULAR | Status: AC
  Filled 2017-10-23: qty 2

## 2017-10-23 MED ORDER — MIDAZOLAM HCL 2 MG/2ML IJ SOLN
INTRAMUSCULAR | Status: DC | PRN
  Administered 2017-10-23: 2 mg via INTRAVENOUS

## 2017-10-23 MED ORDER — SODIUM CHLORIDE 0.9% FLUSH: 3.0000 mL | INTRAVENOUS | Status: DC | PRN

## 2017-10-23 MED ORDER — SODIUM CHLORIDE 0.9 % WEIGHT BASED INFUSION
3.0000 mL/kg/h | INTRAVENOUS | Status: AC
  Administered 2017-10-23: 3 mL/kg/h via INTRAVENOUS

## 2017-10-23 MED ORDER — VERAPAMIL HCL 2.5 MG/ML IV SOLN
INTRAVENOUS | Status: AC
  Filled 2017-10-23: qty 2

## 2017-10-23 MED ORDER — HEPARIN (PORCINE) IN NACL 2-0.9 UNIT/ML-% IJ SOLN
INTRAMUSCULAR | Status: AC | PRN
  Administered 2017-10-23: 1500 mL

## 2017-10-23 MED ORDER — HEPARIN (PORCINE) IN NACL 2-0.9 UNIT/ML-% IJ SOLN
INTRAMUSCULAR | Status: AC
  Filled 2017-10-23: qty 1000

## 2017-10-23 MED ORDER — LIDOCAINE HCL (PF) 1 % IJ SOLN
INTRAMUSCULAR | Status: DC | PRN
  Administered 2017-10-23: 1 mL via INTRADERMAL

## 2017-10-23 MED ORDER — DIAZEPAM 5 MG PO TABS
ORAL_TABLET | ORAL | Status: AC
  Administered 2017-10-23: 5 mg via ORAL
  Filled 2017-10-23: qty 1

## 2017-10-23 MED ORDER — SODIUM CHLORIDE 0.9 % WEIGHT BASED INFUSION: 1.0000 mL/kg/h | INTRAVENOUS | Status: DC

## 2017-10-23 MED ORDER — HEPARIN SODIUM (PORCINE) 1000 UNIT/ML IJ SOLN
INTRAMUSCULAR | Status: DC | PRN
  Administered 2017-10-23: 5500 [IU] via INTRAVENOUS

## 2017-10-23 MED ORDER — SODIUM CHLORIDE 0.9 % IV SOLN: INTRAVENOUS | Status: DC

## 2017-10-23 MED ORDER — HEPARIN (PORCINE) IN NACL 2-0.9 UNIT/ML-% IJ SOLN
INTRAMUSCULAR | Status: DC | PRN
  Administered 2017-10-23: 10 mL via INTRA_ARTERIAL

## 2017-10-23 MED ORDER — LIDOCAINE HCL (PF) 1 % IJ SOLN
INTRAMUSCULAR | Status: AC
  Filled 2017-10-23: qty 30

## 2017-10-23 MED ORDER — ACETAMINOPHEN 325 MG PO TABS: 650.0000 mg | ORAL_TABLET | ORAL | Status: DC | PRN

## 2017-10-23 MED ORDER — SODIUM CHLORIDE 0.9% FLUSH: 3.0000 mL | Freq: Two times a day (BID) | INTRAVENOUS | Status: DC

## 2017-10-23 MED ORDER — ASPIRIN 81 MG PO CHEW: 81.0000 mg | CHEWABLE_TABLET | ORAL | Status: DC

## 2017-10-23 MED ORDER — IOPAMIDOL (ISOVUE-370) INJECTION 76%
INTRAVENOUS | Status: DC | PRN
  Administered 2017-10-23: 50 mL via INTRA_ARTERIAL

## 2017-10-23 MED ORDER — FENTANYL CITRATE (PF) 100 MCG/2ML IJ SOLN
INTRAMUSCULAR | Status: DC | PRN
  Administered 2017-10-23 (×2): 25 ug via INTRAVENOUS

## 2017-10-23 MED ORDER — FENTANYL CITRATE (PF) 100 MCG/2ML IJ SOLN
INTRAMUSCULAR | Status: AC
  Filled 2017-10-23: qty 2

## 2017-10-23 MED ORDER — ONDANSETRON HCL 4 MG/2ML IJ SOLN: 4.0000 mg | Freq: Four times a day (QID) | INTRAMUSCULAR | Status: DC | PRN

## 2017-10-23 MED ORDER — SODIUM CHLORIDE 0.9 % IV SOLN: 250.0000 mL | INTRAVENOUS | Status: DC | PRN

## 2017-10-23 MED ORDER — HEPARIN SODIUM (PORCINE) 1000 UNIT/ML IJ SOLN
INTRAMUSCULAR | Status: AC
  Filled 2017-10-23: qty 1

## 2017-10-23 MED ORDER — DIAZEPAM 5 MG PO TABS
5.0000 mg | ORAL_TABLET | Freq: Four times a day (QID) | ORAL | Status: DC | PRN
  Administered 2017-10-23: 5 mg via ORAL

## 2017-10-23 SURGICAL SUPPLY — 11 items
CATH INFINITI 5FR ANG PIGTAIL (CATHETERS) ×2 IMPLANT
CATH OPTITORQUE TIG 4.0 5F (CATHETERS) ×2 IMPLANT
DEVICE RAD COMP TR BAND LRG (VASCULAR PRODUCTS) ×2 IMPLANT
GLIDESHEATH SLEND SS 6F .021 (SHEATH) ×2 IMPLANT
GUIDEWIRE INQWIRE 1.5J.035X260 (WIRE) ×1 IMPLANT
INQWIRE 1.5J .035X260CM (WIRE) ×2
KIT HEART LEFT (KITS) ×2 IMPLANT
PACK CARDIAC CATHETERIZATION (CUSTOM PROCEDURE TRAY) ×2 IMPLANT
SYR MEDRAD MARK V 150ML (SYRINGE) IMPLANT
TRANSDUCER W/STOPCOCK (MISCELLANEOUS) ×2 IMPLANT
TUBING CIL FLEX 10 FLL-RA (TUBING) ×2 IMPLANT

## 2017-10-23 NOTE — Interval H&P Note (Signed)
Cath Lab Visit (complete for each Cath Lab visit)  Clinical Evaluation Leading to the Procedure:   ACS: No.  Non-ACS:    Anginal Classification: CCS III  Anti-ischemic medical therapy: Minimal Therapy (1 class of medications)  Non-Invasive Test Results: No non-invasive testing performed  Prior CABG: No previous CABG      History and Physical Interval Note:  10/23/2017 12:43 PM  Wendy Carrillo  has presented today for surgery, with the diagnosis of angina  The various methods of treatment have been discussed with the patient and family. After consideration of risks, benefits and other options for treatment, the patient has consented to  Procedure(s): LEFT HEART CATH AND CORONARY ANGIOGRAPHY (N/A) as a surgical intervention .  The patient's history has been reviewed, patient examined, no change in status, stable for surgery.  I have reviewed the patient's chart and labs.  Questions were answered to the patient's satisfaction.     Shelva Majestic

## 2017-10-23 NOTE — Discharge Instructions (Signed)

## 2017-10-23 NOTE — Progress Notes (Signed)
At [place] pt states she feels fine now and is ready to go home.  Pt wheeled to car in wheelchair without incident

## 2017-10-23 NOTE — Progress Notes (Signed)
When getting pt up and dressed for discharge, pt complained of being dizzy.  Had pt sit back down.  VSS HR 74  BP135/65   sats 100%

## 2017-10-24 ENCOUNTER — Encounter (HOSPITAL_COMMUNITY): Payer: Self-pay | Admitting: Cardiovascular Disease

## 2017-11-04 DIAGNOSIS — I10 Essential (primary) hypertension: Secondary | ICD-10-CM | POA: Insufficient documentation

## 2017-11-04 DIAGNOSIS — Z8489 Family history of other specified conditions: Secondary | ICD-10-CM | POA: Insufficient documentation

## 2017-11-04 DIAGNOSIS — M48061 Spinal stenosis, lumbar region without neurogenic claudication: Secondary | ICD-10-CM | POA: Insufficient documentation

## 2017-11-04 DIAGNOSIS — G8929 Other chronic pain: Secondary | ICD-10-CM | POA: Insufficient documentation

## 2017-11-04 DIAGNOSIS — M1990 Unspecified osteoarthritis, unspecified site: Secondary | ICD-10-CM | POA: Insufficient documentation

## 2017-11-04 DIAGNOSIS — M545 Low back pain: Secondary | ICD-10-CM

## 2017-11-04 DIAGNOSIS — K2970 Gastritis, unspecified, without bleeding: Secondary | ICD-10-CM | POA: Insufficient documentation

## 2017-11-04 DIAGNOSIS — Z8719 Personal history of other diseases of the digestive system: Secondary | ICD-10-CM | POA: Insufficient documentation

## 2017-11-04 DIAGNOSIS — K759 Inflammatory liver disease, unspecified: Secondary | ICD-10-CM | POA: Insufficient documentation

## 2017-11-08 ENCOUNTER — Ambulatory Visit (HOSPITAL_COMMUNITY): Payer: Medicare Other | Attending: Cardiovascular Disease

## 2017-11-08 ENCOUNTER — Other Ambulatory Visit: Payer: Self-pay

## 2017-11-08 DIAGNOSIS — I10 Essential (primary) hypertension: Secondary | ICD-10-CM | POA: Diagnosis not present

## 2017-11-08 DIAGNOSIS — I209 Angina pectoris, unspecified: Secondary | ICD-10-CM

## 2017-11-08 DIAGNOSIS — Z8249 Family history of ischemic heart disease and other diseases of the circulatory system: Secondary | ICD-10-CM | POA: Diagnosis not present

## 2017-11-08 DIAGNOSIS — R609 Edema, unspecified: Secondary | ICD-10-CM | POA: Diagnosis not present

## 2017-11-08 DIAGNOSIS — R0602 Shortness of breath: Secondary | ICD-10-CM | POA: Diagnosis present

## 2017-11-18 NOTE — Progress Notes (Signed)
Cardiology Office Note  Date:  11/19/2017   ID:  Wendy Carrillo, DOB Jul 15, 1941, MRN [id number]  PCP:  Jilda Panda, MD  Cardiologist:   Skeet Latch, MD   Chief Complaint  Patient presents with  . Appointment    swelling and pain in right arm and hand since procedure. has had swelling in right leg as well after procedure.      History of Present Illness: Wendy Carrillo is a 77 y.o. female with hypertension, morbid obesity, and mild, non-obstructive CAD who presents for follow up.  She was initially seen 10/2017 for an evaluation of chest pain and presyncope.  A few weeks prior she started having intermittent chest pain.  The episodes occur both at rest and with exertion.  At times the symptoms were so severe that she had to stop cooking.  She had a CT of the abdomen at [facility] 09/2017 that noted coronary artery calcifications.  Her symtpoms were concerning for ischemia so she was referred for left heart catheterization that revealed mild, non-obstructive CAD.  After the cath she struggled with pain and swelling and weakness in her right arm.  She reports that there was spasm during the case.  She hasn't experienced any more chest pain and her breathing has been stable.  She reports some swelling in her R leg but no orthopnea or PND.  She wonders if she can start participating in an exercise program.   Wendy Carrillo was previously a patient of Dr. Marlou Porch.  She last saw him 04/2015 for chest pain.  She had recently been hospitalized with pleuritic, left-sided chest pain.  D-dimer was elevated and V/Q scan and lower extremity Dopplers were unremarkable.  Her chest pain was thought to be very atypical and did not occur with exertion.    Past Medical History:  Diagnosis Date  . Arthritis    "all over"  . Chronic lower back pain   . Coronary artery calcification 10/21/2017  . Family history of adverse reaction to anesthesia    "one of my son's passed out"  . Gastritis   . GERD  (gastroesophageal reflux disease)   . 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 History of blood transfusion 1992   "related to knee replacement"  . History of duodenal ulcer   . Hypertension   . Spinal stenosis, lumbar     Past Surgical History:  Procedure Laterality Date  . ABDOMINAL HERNIA REPAIR    . ANTERIOR AND POSTERIOR VAGINAL REPAIR  2015  . APPENDECTOMY  ?1980's  . BLADDER SUSPENSION    . BREAST CYST EXCISION Bilateral   . BREAST SURGERY Left    "nipple taken off"  . CARDIAC CATHETERIZATION    . CARPAL TUNNEL RELEASE Bilateral 2010's  . CATARACT EXTRACTION W/ INTRAOCULAR LENS IMPLANT Left 2014  . CHOLECYSTECTOMY OPEN  1980's  . DILATION AND CURETTAGE OF UTERUS  "several"  . HERNIA REPAIR    . JOINT REPLACEMENT    . LEFT HEART CATH AND CORONARY ANGIOGRAPHY N/A 10/23/2017   Procedure: LEFT HEART CATH AND CORONARY ANGIOGRAPHY;  Surgeon: Troy Sine, MD;  Location: [place] [facility];  Service: Cardiovascular;  Laterality: N/A;  . PATELLA FRACTURE SURGERY Left "after 1992"   "after the replacement"  . REDUCTION MAMMAPLASTY Bilateral   . REPLACEMENT TOTAL KNEE Bilateral    "left in 1992; right in 2001"  . VAGINAL HYSTERECTOMY  1972     Current Outpatient Medications  Medication Sig Dispense Refill  . acetaminophen (TYLENOL) 500 MG tablet Take 1,000 mg by mouth 2 (two) times daily as needed for moderate pain.    Marland Kitchen ALPRAZolam (XANAX) 0.25 MG tablet Take 1 tablet (0.25 mg total) by mouth daily as needed. 30 tablet 0  . aspirin EC 81 MG tablet Take 81 mg by mouth daily.    . celecoxib (CELEBREX) 200 MG capsule Take 200 mg by mouth daily as needed for mild pain.     . cetirizine (ZYRTEC) 10 MG tablet Take 10 mg by mouth daily as needed for allergies.   5  . Dexlansoprazole (DEXILANT) 30 MG capsule Take 30 mg by mouth daily.    . furosemide (LASIX) 20 MG tablet Take 20 mg by mouth daily as needed for fluid.       Marland Kitchen gabapentin (NEURONTIN) 300 MG capsule Take 300 mg by mouth daily as needed (for pain).     . pantoprazole (PROTONIX) 20 MG tablet Take 1 tablet (20 mg total) by mouth daily. 30 tablet 0  . ramipril (ALTACE) 2.5 MG capsule Take 2.5 mg by mouth daily.  5  . traMADol (ULTRAM) 50 MG tablet Take 50 mg by mouth 2 (two) times daily as needed.  0  . Vitamin D, Ergocalciferol, (DRISDOL) 50000 UNITS CAPS capsule Take 50,000 Units by mouth every Thursday.   5   No current facility-administered medications for this visit.     Allergies:   Contrast media [iodinated diagnostic agents]; Other; Baclofen; and Penicillins    Social History:  The patient  reports that  has never smoked. she has never used smokeless tobacco. She reports that she does not drink alcohol or use drugs.   Family History:  The patient's family history includes Breast cancer in her sister, sister, and sister; Diabetes in her mother; Heart attack in her father; Heart disease in her brother; Hypertension in her mother; Leukemia in her brother; Stroke in her mother.    ROS:  Please see the history of present illness.   Otherwise, review of systems are positive for none.   All other systems are reviewed and negative.    PHYSICAL EXAM: VS:  BP 132/82   Pulse 69   Ht 5\' 4"  (1.626 m)   Wt 238 lb (108 kg)   SpO2 96%   BMI 40.85 kg/m  , BMI Body mass index is 40.85 kg/m. GENERAL:  Well appearing HEENT: Pupils equal round and reactive, fundi not visualized, oral mucosa unremarkable NECK:  No jugular venous distention, waveform within normal limits, carotid upstroke brisk and symmetric, no bruits, no thyromegaly LYMPHATICS:  No cervical adenopathy LUNGS:  Clear to auscultation bilaterally HEART:  RRR.  PMI not displaced or sustained,S1 and S2 within normal limits, no S3, no S4, no clicks, no rubs, no murmurs ABD:  Flat, positive bowel sounds normal in frequency in pitch, no bruits, no rebound, no guarding, no midline pulsatile  mass, no hepatomegaly, no splenomegaly EXT:  2 plus pulses throughout, no edema, no cyanosis no clubbing SKIN:  No rashes no nodules NEURO:  Cranial nerves II through XII grossly intact, motor grossly intact throughout PSYCH:  Cognitively intact, oriented to person place and time   EKG:  EKG is not ordered today. The ekg ordered 10/21/17 demonstrates sinus rhythm.  Rate 61 bpm.  Left anterior fascicular block.  Poor R wave progression.  Nonspecific ST/T changes.  Echo 11/08/17: Study Conclusions  - Left  ventricle: The cavity size was normal. Systolic function was   normal. The estimated ejection fraction was in the range of 55%   to 60%. Wall motion was normal; there were no regional wall   motion abnormalities. Left ventricular diastolic function   parameters were normal. - Left atrium: The atrium was mildly dilated.   LHC 10/23/17: There is mild coronary calcification of all 3 major coronary arteries without significant obstructive  disease.   Dominant right coronary system. LVEDP 13 mmHg.  Lexiscan Myoview 05/2015:  The left ventricular ejection fraction is mildly decreased (45-54%).  Nuclear stress EF: 51%.  There was no ST segment deviation noted during stress.  This is a low risk study.   Low risk study.  No evidence for ischemia or infarction.  EF 51% with diffuse hypokinesis, would consider confirming mildly decreased EF by echo.   Echo 05/05/15: Study Conclusions  - Left ventricle: The cavity size was normal. Systolic function was   normal. The estimated ejection fraction was in the range of 50%   to 55%. Wall motion was normal; there were no regional wall   motion abnormalities. - Aortic valve: Trileaflet; normal thickness, mildly calcified   leaflets. - Mitral valve: There was trivial regurgitation.   Recent Labs: 10/21/2017: BUN 14; Creatinine, Ser 0.77; Hemoglobin 13.0; Platelets 239; Potassium 4.6; Sodium 142    Lipid Panel No results found for:  CHOL, TRIG, HDL, CHOLHDL, VLDL, LDLCALC, LDLDIRECT    Wt Readings from Last 3 Encounters:  11/19/17 238 lb (108 kg)  10/23/17 237 lb (107.5 kg)  10/21/17 237 lb 3.2 oz (107.6 kg)      ASSESSMENT AND PLAN:  # Chest pain: # Coronary artery calcification: # Shortness of breath: Chest pain resolved.  Cath showed mild, non-obstructive disease. Continue aspirin.  Encouraged exercise.  # Hypertension: BP is controlled on ramipril and lasix.  # Morbid obesity: Encouraged weight loss.  She will start Silver Sneakers.   Current medicines are reviewed at length with the patient today.  The patient does not have concerns regarding medicines.  The following changes have been made:  none  Labs/ tests ordered today include:   No orders of the defined types were placed in this encounter.    Disposition:   FU with Yaiden Yang C. Oval Linsey, MD, [facility] in 1 year.   This note was written with the assistance of speech recognition software.  Please excuse any transcriptional errors.  Signed, Raniya Golembeski C. Oval Linsey, MD, [facility]  11/19/2017 10:56 AM    [place]

## 2017-11-19 ENCOUNTER — Other Ambulatory Visit: Payer: Self-pay | Admitting: *Deleted

## 2017-11-19 ENCOUNTER — Ambulatory Visit: Payer: Medicare Other | Admitting: Cardiovascular Disease

## 2017-11-19 ENCOUNTER — Encounter: Payer: Self-pay | Admitting: Cardiovascular Disease

## 2017-11-19 ENCOUNTER — Encounter: Payer: Self-pay | Admitting: *Deleted

## 2017-11-19 VITALS — BP 132/82 | HR 69 | Ht 64.0 in | Wt 238.0 lb

## 2017-11-19 DIAGNOSIS — I2510 Atherosclerotic heart disease of native coronary artery without angina pectoris: Secondary | ICD-10-CM

## 2017-11-19 DIAGNOSIS — I10 Essential (primary) hypertension: Secondary | ICD-10-CM

## 2017-11-19 DIAGNOSIS — R0602 Shortness of breath: Secondary | ICD-10-CM

## 2017-11-19 DIAGNOSIS — I2584 Coronary atherosclerosis due to calcified coronary lesion: Secondary | ICD-10-CM

## 2017-11-19 NOTE — Patient Instructions (Signed)

## 2017-11-21 ENCOUNTER — Encounter: Payer: Self-pay | Admitting: Cardiovascular Disease

## 2018-01-15 ENCOUNTER — Ambulatory Visit (INDEPENDENT_AMBULATORY_CARE_PROVIDER_SITE_OTHER): Payer: Self-pay | Admitting: Orthopedic Surgery

## 2018-01-22 ENCOUNTER — Other Ambulatory Visit: Payer: Self-pay

## 2018-01-22 ENCOUNTER — Emergency Department (HOSPITAL_BASED_OUTPATIENT_CLINIC_OR_DEPARTMENT_OTHER): Payer: Medicare Other

## 2018-01-22 ENCOUNTER — Emergency Department (HOSPITAL_BASED_OUTPATIENT_CLINIC_OR_DEPARTMENT_OTHER)
Admission: EM | Admit: 2018-01-22 | Discharge: 2018-01-22 | Disposition: A | Discharge status: Home / Self Care | Payer: Medicare Other | Attending: Emergency Medicine | Admitting: Emergency Medicine

## 2018-01-22 ENCOUNTER — Encounter (HOSPITAL_BASED_OUTPATIENT_CLINIC_OR_DEPARTMENT_OTHER): Payer: Self-pay

## 2018-01-22 DIAGNOSIS — Z7982 Long term (current) use of aspirin: Secondary | ICD-10-CM | POA: Diagnosis not present

## 2018-01-22 DIAGNOSIS — I10 Essential (primary) hypertension: Secondary | ICD-10-CM | POA: Diagnosis not present

## 2018-01-22 DIAGNOSIS — Z9861 Coronary angioplasty status: Secondary | ICD-10-CM | POA: Diagnosis not present

## 2018-01-22 DIAGNOSIS — Z96653 Presence of artificial knee joint, bilateral: Secondary | ICD-10-CM | POA: Diagnosis not present

## 2018-01-22 DIAGNOSIS — M79651 Pain in right thigh: Secondary | ICD-10-CM | POA: Diagnosis not present

## 2018-01-22 MED ORDER — TRAMADOL HCL 50 MG PO TABS
100.0000 mg | ORAL_TABLET | Freq: Once | ORAL | Status: AC
  Administered 2018-01-22: 100 mg via ORAL
  Filled 2018-01-22: qty 2

## 2018-01-22 NOTE — ED Notes (Signed)
Pt had tramadol and tylenol PTA

## 2018-01-22 NOTE — ED Notes (Signed)
Pt ambulated to door of treatment room with slow but steady gate. Pt able to bare weight. Pt also reports she has a walker at home she uses for support when her leg begins to hurt. EDP updated.

## 2018-01-22 NOTE — ED Triage Notes (Signed)
C/o pain to entire left LE x 1 + week-denies known injury-NAD-presents to triage in w/c

## 2018-01-22 NOTE — ED Notes (Signed)
Patient transported to X-ray 

## 2018-01-22 NOTE — ED Provider Notes (Signed)
[place] EMERGENCY DEPARTMENT Provider Note   CSN: [id number] Arrival date & time: 01/22/18  1934     History   Chief Complaint Chief Complaint  Patient presents with  . Leg Pain    HPI Wendy Carrillo is a 77 y.o. female.  HPI  77 year old female presents with left lateral thigh pain.  She states this is been ongoing for a couple months but much worse over the last 1 week.  When she first wakes up the pain is not there but it seems to come throughout the day.  At some point it is so painful is hard to walk because of the pain.  She is been on tramadol 50 mg and Tylenol with no relief.  Sometimes takes Celebrex with no relief.  There is no weakness or numbness.  The pain sometimes radiates down her leg.  She has chronic back pain but states that this is different and her back is not hurting worse than typical.  No incontinence.  No trauma. She feels like her left lateral thigh is swollen above the knee.  Past Medical History:  Diagnosis Date  . Arthritis    "all over"  . Chronic lower back pain   . Coronary artery calcification 10/21/2017  . Family history of adverse reaction to anesthesia    "one of my son's passed out"  . Gastritis   . GERD (gastroesophageal reflux disease)   . 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 History of blood transfusion 1992   "related to knee replacement"  . History of duodenal ulcer   . Hypertension   . Spinal stenosis, lumbar     Patient Active Problem List   Diagnosis Date Noted  . Spinal stenosis, lumbar   . Hypertension   . History of duodenal ulcer   . Hepatitis   . Gastritis   . Family history of adverse reaction to anesthesia   . Chronic lower back pain   . Arthritis   . Coronary artery calcification 10/21/2017  . Combined forms of age-related cataract of right eye 04/18/2017  . Status post total bilateral knee replacement 06/08/2016  . Left lumbar  radiculopathy 05/09/2016  . Bilateral shoulder pain 03/22/2016  . DDD (degenerative disc disease), cervical 03/22/2016  . Morbid obesity with BMI of 40.0-44.9, adult ([place]) 03/08/2016  . Spondylolisthesis, 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/2016  . Neck pain 03/08/2016  . Lumbar stenosis 03/08/2016  . Cervical spinal stenosis 03/08/2016  . Back pain 11/16/2015  . 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/10/2015  . Angina pectoris ([place]) 04/20/2015  . Morbid obesity ([place]) 04/20/2015  . Dyspnea 04/20/2015  . Atypical chest pain 04/20/2015  . Esophageal reflux 02/23/2015  . Essential hypertension 02/23/2015  . Chronic back pain 02/23/2015  . Chest pain 02/22/2015  . Abdominal tenderness, LLQ (left lower quadrant) 06/15/2014  . Acute sinusitis 06/15/2014  . Allergic rhinitis due to pollen 06/15/2014  . Bladder disorder 06/15/2014  . Epistaxis 06/15/2014  . Female stress incontinence 06/15/2014  . 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 Insomnia 06/15/2014  . Localized primary osteoarthritis of wrist 06/15/2014  . Lumbosacral spondylosis 06/15/2014  . Osteoarthrosis, localized, primary, hand 06/15/2014  . Referred otalgia 06/15/2014  . Tingling 06/15/2014  . Tinnitus 06/15/2014  . Urinary incontinence 06/15/2014  . Cystocele 05/04/2014  . Retention of urine 05/04/2014  . Cystocele, lateral 04/12/2014  . Rectocele 03/17/2014  . History of blood transfusion 11/05/1990  Past Surgical History:  Procedure Laterality Date  . ABDOMINAL HERNIA REPAIR    . ANTERIOR AND POSTERIOR VAGINAL REPAIR  2015  . APPENDECTOMY  ?1980's  . BLADDER SUSPENSION    . BREAST CYST EXCISION Bilateral   . BREAST SURGERY Left    "nipple taken off"  . CARDIAC CATHETERIZATION    . CARPAL TUNNEL RELEASE Bilateral 2010's  . CATARACT EXTRACTION W/ INTRAOCULAR LENS IMPLANT Left 2014  . CHOLECYSTECTOMY OPEN  1980's  . DILATION AND CURETTAGE OF UTERUS  "several"  . HERNIA REPAIR    . JOINT REPLACEMENT    . LEFT HEART CATH AND CORONARY  ANGIOGRAPHY N/A 10/23/2017   Procedure: LEFT HEART CATH AND CORONARY ANGIOGRAPHY;  Surgeon: Troy Sine, MD;  Location: [place] [facility];  Service: Cardiovascular;  Laterality: N/A;  . PATELLA FRACTURE SURGERY Left "after 1992"   "after the replacement"  . REDUCTION MAMMAPLASTY Bilateral   . REPLACEMENT TOTAL KNEE Bilateral    "left in 1992; right in 2001"  . VAGINAL HYSTERECTOMY  1972    OB History    No data available       Home Medications    Prior to Admission medications   Medication Sig Start Date End Date Taking? Authorizing Provider  acetaminophen (TYLENOL) 500 MG tablet Take 1,000 mg by mouth 2 (two) times daily as needed for moderate pain.    [provider]  ALPRAZolam Duanne Moron) 0.25 MG tablet Take 1 tablet (0.25 mg total) by mouth daily as needed. 02/24/15   Rai, Vernelle Emerald, MD  aspirin EC 81 MG tablet Take 81 mg by mouth daily.    [provider]  celecoxib (CELEBREX) 200 MG capsule Take 200 mg by mouth daily as needed for mild pain.  05/16/10   [provider]  cetirizine (ZYRTEC) 10 MG tablet Take 10 mg by mouth daily as needed for allergies.  01/24/15   [provider]  Dexlansoprazole (DEXILANT) 30 MG capsule Take 30 mg by mouth daily.    [provider]  furosemide (LASIX) 20 MG tablet Take 20 mg by mouth daily as needed for fluid.     [provider]  gabapentin (NEURONTIN) 300 MG capsule Take 300 mg by mouth daily as needed (for pain).     [provider]  pantoprazole (PROTONIX) 20 MG tablet Take 1 tablet (20 mg total) by mouth daily. 08/27/16   Harlin Heys, MD  ramipril (ALTACE) 2.5 MG capsule Take 2.5 mg by mouth daily. 02/17/15   [provider]  traMADol (ULTRAM) 50 MG tablet Take 50 mg by mouth 2 (two) times daily as needed. 10/28/17   [provider]  Vitamin D, Ergocalciferol, (DRISDOL) 50000 UNITS CAPS capsule Take 50,000 Units by mouth every Thursday.  02/17/15   [provider]    Family History Family History  Problem Relation Age of Onset  . Hypertension Mother   . Stroke Mother   . Diabetes Mother   . Heart attack Father   . Breast cancer Sister   . Leukemia Brother   . Breast cancer Sister   . Breast cancer Sister   . Heart disease Brother     Social History Social History   Tobacco Use  . Smoking status: Never Smoker  . Smokeless tobacco: Never Used  Substance Use Topics  . Alcohol use: No  . Drug use: No     Allergies   Contrast media [iodinated diagnostic agents]; Other; Baclofen; and Penicillins  Review of Systems Review of Systems  Musculoskeletal: Positive for back pain (chronic) and myalgias.  Neurological: Negative for weakness and numbness.  All other systems reviewed and are negative.    Physical Exam Updated Vital Signs BP 111/64 (BP Location: Left Arm)   Pulse 70   Temp 97.7 F (36.5 C) (Oral)   Resp 20   Ht 5\' 5"  (1.651 m)   Wt 104.3 kg (230 lb)   SpO2 98%   BMI 38.27 kg/m   Physical Exam  Constitutional: She is oriented to person, place, and time. She appears well-developed and well-nourished.  obese  HENT:  Head: Normocephalic and atraumatic.  Right Ear: External ear normal.  Left Ear: External ear normal.  Nose: Nose normal.  Eyes: Right eye exhibits no discharge. Left eye exhibits no discharge.  Cardiovascular: Normal rate and regular rhythm.  Pulses:      Dorsalis pedis pulses are 2+ on the right side, and 2+ on the left side.  Pulmonary/Chest: Effort normal.  Musculoskeletal:       Left hip: She exhibits normal range of motion and no tenderness.       Left knee: She exhibits normal range of motion. No tenderness found.       Left upper leg: She exhibits tenderness.       Legs: Neurological: She is alert and oriented to person, place, and time.  Skin: Skin is warm and dry.  Nursing note and vitals reviewed.    ED Treatments / Results  Labs (all labs ordered are listed, but  only abnormal results are displayed) Labs Reviewed - No data to display  EKG  EKG Interpretation None       Radiology Dg Femur Min 2 Views Left  Result Date: 01/22/2018 CLINICAL DATA:  Pain for 1 week EXAM: LEFT FEMUR 2 VIEWS COMPARISON:  None. FINDINGS: No acute displaced fracture or malalignment. Status post left knee replacement with intact hardware. Vascular calcifications within the soft tissues. IMPRESSION: No acute osseous abnormality.  Status post left knee replacement. Electronically Signed   By: Donavan Foil M.D.   On: 01/22/2018 21:55    Procedures Procedures (including critical care time)  Medications Ordered in ED Medications  traMADol (ULTRAM) tablet 100 mg (100 mg Oral Given 01/22/18 2100)     Initial Impression / Assessment and Plan / ED Course  I have reviewed the triage vital signs and the nursing notes.  Pertinent labs & imaging results that were available during my care of the patient were reviewed by me and considered in my medical decision making (see chart for details).     I do not appreciate any significant swelling to the lateral aspect of her thigh but it is difficult due to obesity.  Otherwise, she is neurovascularly intact.  Given the progressive pain, x-ray obtained to help rule out acute bony abnormality.  There is no hip tenderness and she is able to ambulate although with pain.  She is afebrile.  Given she is neurovascular intact I do not think emergent other imaging is indicated.  This could be soft tissue in origin such as muscular and so she might need an MRI as an outpatient.  However there is no emergent cause.  She also notes that she has run out of her tramadol.  I suspect this may be leading to her worsening pain over this past week as she has been running out.  She will need to follow-up with her PCP for repeat prescription as I told  her we are not able to refill in the ED for chronic pain medicines.  Discussed return precautions.  Final  Clinical Impressions(s) / ED Diagnoses   Final diagnoses:  Thigh pain, musculoskeletal, right    ED Discharge Orders    None       [place] Gambler, MD 01/22/18 2326

## 2018-01-29 ENCOUNTER — Ambulatory Visit (INDEPENDENT_AMBULATORY_CARE_PROVIDER_SITE_OTHER): Payer: Medicare Other

## 2018-01-29 ENCOUNTER — Ambulatory Visit (INDEPENDENT_AMBULATORY_CARE_PROVIDER_SITE_OTHER): Payer: Medicare Other | Admitting: Orthopedic Surgery

## 2018-01-29 ENCOUNTER — Encounter (INDEPENDENT_AMBULATORY_CARE_PROVIDER_SITE_OTHER): Payer: Self-pay | Admitting: Orthopedic Surgery

## 2018-01-29 DIAGNOSIS — G8929 Other chronic pain: Secondary | ICD-10-CM | POA: Diagnosis not present

## 2018-01-29 DIAGNOSIS — M25561 Pain in right knee: Secondary | ICD-10-CM

## 2018-01-29 DIAGNOSIS — M5410 Radiculopathy, site unspecified: Secondary | ICD-10-CM

## 2018-01-29 DIAGNOSIS — M25562 Pain in left knee: Secondary | ICD-10-CM

## 2018-02-01 ENCOUNTER — Ambulatory Visit
Admission: RE | Admit: 2018-02-01 | Discharge: 2018-02-01 | Disposition: A | Discharge status: Home / Self Care | Payer: Medicare Other | Source: Ambulatory Visit | Attending: Orthopedic Surgery | Admitting: Orthopedic Surgery

## 2018-02-01 DIAGNOSIS — M5410 Radiculopathy, site unspecified: Secondary | ICD-10-CM
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
## 2018-02-03 ENCOUNTER — Telehealth (INDEPENDENT_AMBULATORY_CARE_PROVIDER_SITE_OTHER): Payer: Self-pay | Admitting: Orthopedic Surgery

## 2018-02-03 DIAGNOSIS — M545 Low back pain: Secondary | ICD-10-CM

## 2018-02-03 NOTE — Telephone Encounter (Signed)
Patients daughter called on behalf of patient about MRI results, she had the MRI Saturday and said she has been in a lot of pain and would like Dr.Dean to just view the MRI and if its anything serious if he would call her with the results or try to get her in sooner than 4/10. Please advise patient # [phone]

## 2018-02-04 NOTE — Telephone Encounter (Signed)
I called her daughter.  She is fairly miserable on the left-hand side.  Please set her up for lumbar spine ESI either with Dr. Marlou Sa or [facility] imaging whoever can get her in the past.  Also when you talk to her can you find out who she last saw in terms of back surgery thanks

## 2018-02-04 NOTE — Telephone Encounter (Signed)
What is the soonest that you have for patient per Dr Marlou Sa?

## 2018-02-04 NOTE — Telephone Encounter (Signed)
Patient called this morning asking for a call back regarding the message below. # [phone]

## 2018-02-04 NOTE — Telephone Encounter (Signed)
Please advise on MRI results. Thanks.  

## 2018-02-04 NOTE — Telephone Encounter (Signed)
Looks like 4/16 or 4/17 right now.

## 2018-02-05 NOTE — Telephone Encounter (Signed)
How soon do you think that [facility] can get someone in for [facility].

## 2018-02-05 NOTE — Telephone Encounter (Signed)
I left message with imaging to contact me in regarding this

## 2018-02-07 NOTE — Telephone Encounter (Signed)
The soonest [facility] or Dr Ernestina Patches could get her in is 2 weeks out. Is there anything that you could suggest that can be done until we can get her in for injection?

## 2018-02-07 NOTE — Telephone Encounter (Signed)
Wendy Carrillo with [facility] returned call and she stated right now her first avail is going to be 15 April. Most time she is about 2 weeks out.

## 2018-02-07 NOTE — Telephone Encounter (Signed)
Does she want pain medicine or muscle relaxers

## 2018-02-07 NOTE — Telephone Encounter (Signed)
IC and LVM with [facility] to return my call in reference to seeing how long. Pending call back

## 2018-02-10 NOTE — Telephone Encounter (Signed)
Okay for Robaxin 500 mg p.o. every 8 hours as needed spasm #30 along with tramadol 1 p.o. every 8-12 hours as needed pain #30

## 2018-02-10 NOTE — Telephone Encounter (Signed)
Her daughter said either. Just something so that she could try to get some relief

## 2018-02-11 NOTE — Telephone Encounter (Signed)
I have put order in for [facility] with Dr Ernestina Patches to be done ASAP Also, called and LMVM for her daughter to let her know Dr Forbes Cellar suggestion on what we could call in for her mom to help with the pain until her appt to get ESI. LM for her to [facility] to discuss and also so we could verify pharmacy.

## 2018-02-12 ENCOUNTER — Encounter (INDEPENDENT_AMBULATORY_CARE_PROVIDER_SITE_OTHER): Payer: Self-pay | Admitting: Orthopedic Surgery

## 2018-02-12 ENCOUNTER — Ambulatory Visit (INDEPENDENT_AMBULATORY_CARE_PROVIDER_SITE_OTHER): Payer: Medicare Other | Admitting: Orthopedic Surgery

## 2018-02-12 DIAGNOSIS — M5440 Lumbago with sciatica, unspecified side: Secondary | ICD-10-CM

## 2018-02-12 DIAGNOSIS — G8929 Other chronic pain: Secondary | ICD-10-CM | POA: Diagnosis not present

## 2018-02-12 MED ORDER — HYDROCODONE-ACETAMINOPHEN 5-325 MG PO TABS: ORAL_TABLET | ORAL | 0 refills | Status: DC

## 2018-02-13 ENCOUNTER — Encounter (INDEPENDENT_AMBULATORY_CARE_PROVIDER_SITE_OTHER): Payer: Self-pay | Admitting: Orthopedic Surgery
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
## 2018-02-18 ENCOUNTER — Encounter (INDEPENDENT_AMBULATORY_CARE_PROVIDER_SITE_OTHER): Payer: Self-pay | Admitting: Physical Medicine and Rehabilitation

## 2018-02-18 ENCOUNTER — Ambulatory Visit (INDEPENDENT_AMBULATORY_CARE_PROVIDER_SITE_OTHER): Payer: Medicare Other | Admitting: Physical Medicine and Rehabilitation

## 2018-02-18 ENCOUNTER — Ambulatory Visit (INDEPENDENT_AMBULATORY_CARE_PROVIDER_SITE_OTHER): Payer: Medicare Other

## 2018-02-18 VITALS — BP 137/75 | HR 69 | Temp 98.4°F

## 2018-02-18 DIAGNOSIS — M48062 Spinal stenosis, lumbar region with neurogenic claudication: Secondary | ICD-10-CM

## 2018-02-18 DIAGNOSIS — M5416 Radiculopathy, lumbar region: Secondary | ICD-10-CM

## 2018-02-18 MED ORDER — METHYLPREDNISOLONE ACETATE 80 MG/ML IJ SUSP
80.0000 mg | Freq: Once | INTRAMUSCULAR | Status: AC
  Administered 2018-02-18: 80 mg

## 2018-02-18 NOTE — Patient Instructions (Signed)
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
## 2018-02-18 NOTE — Progress Notes (Signed)
 .  Numeric Pain Rating Scale and Functional Assessment Average Pain 8   In the last MONTH (on 0-10 scale) has pain interfered with the following?  1. General activity like being  able to carry out your everyday physical activities such as walking, climbing stairs, carrying groceries, or moving a chair?  Rating(5)   +Driver, -BT, +Dye Allergies (Contrast).

## 2018-02-26 NOTE — Procedures (Signed)
Lumbar Epidural Steroid Injection - Interlaminar Approach with Fluoroscopic Guidance  Patient: Wendy Carrillo      Date of Birth: [birth year] MRN: [id number] PCP: Jilda Panda, MD      Visit Date: 02/18/2018   Universal Protocol:     Consent Given By: the patient  Position: PRONE  Additional Comments: Vital signs were monitored before and after the procedure. Patient was prepped and draped in the usual sterile fashion. The correct patient, procedure, and site was verified.   Injection Procedure Details:  Procedure Site One Meds Administered:  Meds ordered this encounter  Medications  . methylPREDNISolone acetate (DEPO-MEDROL) injection 80 mg     Laterality: Left  Location/Site:  L4-L5  Needle size: 20 G  Needle type: Tuohy  Needle Placement: Paramedian epidural  Findings:   -Comments: Excellent flow of contrast into the epidural space.  Procedure Details: Using a paramedian approach from the side mentioned above, the region overlying the inferior lamina was localized under fluoroscopic visualization and the soft tissues overlying this structure were infiltrated with 4 ml. of 1% Lidocaine without Epinephrine. The Tuohy needle was inserted into the epidural space using a paramedian approach.   The epidural space was localized using loss of resistance along with lateral and bi-planar fluoroscopic views.  After negative aspirate for air, blood, and CSF, a 2 ml. volume of Isovue-250 was injected into the epidural space and the flow of contrast was observed. Radiographs were obtained for documentation purposes.    The injectate was administered into the level noted above.   Additional Comments:  The patient tolerated the procedure well Dressing: Band-Aid    Post-procedure details: Patient was observed during the procedure. Post-procedure instructions were reviewed.  Patient left the clinic in stable condition.

## 2018-04-03 ENCOUNTER — Other Ambulatory Visit: Payer: Self-pay | Admitting: Neurosurgery

## 2018-04-09 ENCOUNTER — Other Ambulatory Visit: Payer: Self-pay | Admitting: Neurosurgery

## 2018-04-09 DIAGNOSIS — M48062 Spinal stenosis, lumbar region with neurogenic claudication: Secondary | ICD-10-CM

## 2018-04-16 ENCOUNTER — Ambulatory Visit
Admission: RE | Admit: 2018-04-16 | Discharge: 2018-04-16 | Disposition: A | Discharge status: Home / Self Care | Payer: Medicare Other | Source: Ambulatory Visit | Attending: Neurosurgery | Admitting: Neurosurgery

## 2018-04-16 DIAGNOSIS — M48062 Spinal stenosis, lumbar region with neurogenic claudication: Secondary | ICD-10-CM

## 2018-04-16 MED ORDER — IOPAMIDOL (ISOVUE-M 200) INJECTION 41%
1.0000 mL | Freq: Once | INTRAMUSCULAR | Status: AC
  Administered 2018-04-16: 1 mL via EPIDURAL

## 2018-04-16 MED ORDER — METHYLPREDNISOLONE ACETATE 40 MG/ML INJ SUSP (RADIOLOG
120.0000 mg | Freq: Once | INTRAMUSCULAR | Status: AC
  Administered 2018-04-16: 120 mg via EPIDURAL

## 2018-04-16 NOTE — Discharge Instructions (Signed)

## 2018-04-21 NOTE — Pre-Procedure Instructions (Signed)
MCKAYLAH BETTENDORF  04/21/2018      [id number] [place], [place] [address]. AT [address] [place] [address] Phone: [phone] Fax: [phone]    Your procedure is scheduled on 05/02/18.  Report to [facility] Admitting at 8 A.M.  Call this number if you have problems the morning of surgery:  [phone]   Remember:  Do not eat or drink after midnight.      Take these medicines the morning of surgery with A SIP OF WATER --ultram,protonix,vicoda,,neurontin,  Xanax  Do not wear jewelry, make-up or nail polish.  Do not wear lotions, powders, or perfumes, or deodorant.  Do not shave 48 hours prior to surgery.  Men may shave face and neck.  Do not bring valuables to the hospital.  [facility] is not responsible for any belongings or valuables.  Contacts, dentures or bridgework may not be worn into surgery.  Leave your suitcase in the car.  After surgery it may be brought to your room.  For patients admitted to the hospital, discharge time will be determined by your treatment team.  Patients discharged the day of surgery will not be allowed to drive home.   Name and phone number of your driver:   Do not take any aspirin,anti-inflammatories,vitamins,or herbal supplements 5-7 days prior to surgery. Special instructions:  [facility] - Preparing for Surgery  Before surgery, you can play an important role.  Because skin is not sterile, your skin needs to be as free of germs as possible.  You can reduce the number of germs on you skin by washing with CHG (chlorahexidine gluconate) soap before surgery.  CHG is an antiseptic cleaner which kills germs and bonds with the skin to continue killing germs even after washing.  Oral Hygiene is also important in reducing the risk of infection.  Remember to brush your teeth with your regular toothpaste the morning of surgery.  Please DO NOT use if you have an allergy to CHG or  antibacterial soaps.  If your skin becomes reddened/irritated stop using the CHG and inform your nurse when you arrive at Short Stay.  Do not shave (including legs and underarms) for at least 48 hours prior to the first CHG shower.  You may shave your face.  Please follow these instructions carefully:   1.  Shower with CHG Soap the night before surgery and the morning of Surgery.  2.  If you choose to wash your hair, wash your hair first as usual with your normal shampoo.  3.  After you shampoo, rinse your hair and body thoroughly to remove the shampoo. 4.  Use CHG as you would any other liquid soap.  You can apply chg directly to the skin and wash gently with a      scrungie or washcloth.           5.  Apply the CHG Soap to your body ONLY FROM THE NECK DOWN.   Do not use on open wounds or open sores. Avoid contact with your eyes, ears, mouth and genitals (private parts).  Wash genitals (private parts) with your normal soap.  6.  Wash thoroughly, paying special attention to the area where your surgery will be performed.  7.  Thoroughly rinse your body with warm water from the neck down.  8.  DO NOT shower/wash with your normal soap after using and rinsing off the CHG Soap.  9.  Pat yourself dry with a clean towel.            10.  Wear clean pajamas.            11.  Place clean sheets on your bed the night of your first shower and do not sleep with pets.  Day of Surgery  Do not apply any lotions/deoderants the morning of surgery.   Please wear clean clothes to the hospital/surgery center. Remember to brush your teeth with toothpaste.    Please read over the following fact sheets that you were given. MRSA Information

## 2018-04-22 ENCOUNTER — Encounter (HOSPITAL_COMMUNITY)
Admission: RE | Admit: 2018-04-22 | Discharge: 2018-04-22 | Disposition: A | Discharge status: Home / Self Care | Payer: Medicare Other | Source: Ambulatory Visit | Attending: Neurosurgery | Admitting: Neurosurgery

## 2018-04-22 ENCOUNTER — Encounter (HOSPITAL_COMMUNITY): Payer: Self-pay

## 2018-04-22 DIAGNOSIS — Z01812 Encounter for preprocedural laboratory examination: Secondary | ICD-10-CM | POA: Diagnosis not present

## 2018-04-22 LAB — CBC
HEMATOCRIT: 42.2 % (ref 36.0–46.0)
Hemoglobin: 13.4 g/dL (ref 12.0–15.0)
MCH: 29.2 pg (ref 26.0–34.0)
MCHC: 31.8 g/dL (ref 30.0–36.0)
MCV: 91.9 fL (ref 78.0–100.0)
Platelets: 272 10*3/uL (ref 150–400)
RBC: 4.59 MIL/uL (ref 3.87–5.11)
RDW: 13.2 % (ref 11.5–15.5)
WBC: 12.4 10*3/uL — AB (ref 4.0–10.5)

## 2018-04-22 LAB — COMPREHENSIVE METABOLIC PANEL
ALT: 30 U/L (ref 14–54)
AST: 18 U/L (ref 15–41)
Albumin: 3.9 g/dL (ref 3.5–5.0)
Alkaline Phosphatase: 54 U/L (ref 38–126)
Anion gap: 11 (ref 5–15)
BILIRUBIN TOTAL: 0.5 mg/dL (ref 0.3–1.2)
BUN: 16 mg/dL (ref 6–20)
CO2: 25 mmol/L (ref 22–32)
Calcium: 9.1 mg/dL (ref 8.9–10.3)
Chloride: 102 mmol/L (ref 101–111)
Creatinine, Ser: 0.96 mg/dL (ref 0.44–1.00)
GFR calc Af Amer: >60 mL/min (ref >60)
GFR, EST NON AFRICAN AMERICAN: 56 mL/min — AB (ref >60)
Glucose, Bld: 142 mg/dL — ABNORMAL HIGH (ref 65–99)
POTASSIUM: 3.9 mmol/L (ref 3.5–5.1)
Sodium: 138 mmol/L (ref 135–145)
TOTAL PROTEIN: 6.7 g/dL (ref 6.5–8.1)

## 2018-04-22 LAB — SURGICAL PCR SCREEN
MRSA, PCR: NEGATIVE
Staphylococcus aureus: NEGATIVE

## 2018-04-22 MED ORDER — CHLORHEXIDINE GLUCONATE CLOTH 2 % EX PADS: 6.0000 | MEDICATED_PAD | Freq: Once | CUTANEOUS | Status: DC

## 2018-04-24 ENCOUNTER — Other Ambulatory Visit (HOSPITAL_COMMUNITY): Payer: Medicare Other

## 2018-05-01 MED ORDER — DEXAMETHASONE SODIUM PHOSPHATE 10 MG/ML IJ SOLN: 10.0000 mg | INTRAMUSCULAR | Status: DC

## 2018-05-01 MED ORDER — VANCOMYCIN HCL 10 G IV SOLR
1500.0000 mg | INTRAVENOUS | Status: DC
  Filled 2018-05-01: qty 1500

## 2018-05-02 ENCOUNTER — Encounter (HOSPITAL_COMMUNITY): Admission: RE | Payer: Self-pay | Source: Ambulatory Visit

## 2018-05-02 ENCOUNTER — Inpatient Hospital Stay (HOSPITAL_COMMUNITY): Admission: RE | Admit: 2018-05-02 | Payer: Medicare Other | Source: Ambulatory Visit | Admitting: Neurosurgery

## 2018-05-02 SURGERY — LUMBAR LAMINECTOMY/DECOMPRESSION MICRODISCECTOMY 3 LEVELS
Anesthesia: General | Site: Back | Laterality: Bilateral

## 2018-05-30 NOTE — Pre-Procedure Instructions (Addendum)
MALISSIA RABBANI  05/30/2018      [id number] [place], [place] - [address]. AT [address]. [place] [address] Phone: [phone] Fax: [phone]    Your procedure is scheduled on Mon., Aug. 5, 2019 from 7:30AM-10:07AM  Report to [facility] Admitting Entrance "A" at 5:30AM  Call this number if you have problems the morning of surgery:  [phone]   Remember:  Do not eat or drink after midnight on Aug. 4th    Take these medicines the morning of surgery with A SIP OF WATER: Gabapentin (NEURONTIN) and ranitidine (ZANTAC)  If needed Acetaminophen (TYLENOL), ALPRAZolam Duanne Moron), HYDROcodone-acetaminophen (NORCO/VICODIN) for mild, TraMADol (ULTRAM) for severe,  and Fluticasone (FLONASE)  As of today, stop taking all Other Aspirin Products, Vitamins, Fish oils, and Herbal medications. Also stop all NSAIDS i.e. Advil, Ibuprofen, Motrin, Aleve, Anaprox, Naproxen, BC, Goody Powders, and all Supplements. Including: Celecoxib (CELEBREX)   Do not wear jewelry, make-up or nail polish.  Do not wear lotions, powders, or perfumes, or deodorant.  Do not shave 48 hours prior to surgery.   Do not bring valuables to the hospital.  [facility] is not responsible for any belongings or valuables.  Contacts, dentures or bridgework may not be worn into surgery.  Leave your suitcase in the car.  After surgery it may be brought to your room.  For patients admitted to the hospital, discharge time will be determined by your treatment team.  Patients discharged the day of surgery will not be allowed to drive home.   Special instructions:  [place]- Preparing For Surgery  Before surgery, you can play an important role. Because skin is not sterile, your skin needs to be as free of germs as possible. You can reduce the number of germs on your skin by washing with CHG (chlorahexidine gluconate) Soap before surgery.  CHG is an antiseptic  cleaner which kills germs and bonds with the skin to continue killing germs even after washing.    Oral Hygiene is also important to reduce your risk of infection.  Remember - BRUSH YOUR TEETH THE MORNING OF SURGERY WITH YOUR REGULAR TOOTHPASTE  Please do not use if you have an allergy to CHG or antibacterial soaps. If your skin becomes reddened/irritated stop using the CHG.  Do not shave (including legs and underarms) for at least 48 hours prior to first CHG shower. It is OK to shave your face.  Please follow these instructions carefully.   1. Shower the NIGHT BEFORE SURGERY and the MORNING OF SURGERY with CHG.   2. If you chose to wash your hair, wash your hair first as usual with your normal shampoo.  3. After you shampoo, rinse your hair and body thoroughly to remove the shampoo.  4. Use CHG as you would any other liquid soap. You can apply CHG directly to the skin and wash gently with a scrungie or a clean washcloth.   5. Apply the CHG Soap to your body ONLY FROM THE NECK DOWN.  Do not use on open wounds or open sores. Avoid contact with your eyes, ears, mouth and genitals (private parts). Wash Face and genitals (private parts)  with your normal soap.  6. Wash thoroughly, paying special attention to the area where your surgery will be performed.  7. Thoroughly rinse your body with warm water from the neck down.  8. DO NOT shower/wash with your normal soap after using  and rinsing off the CHG Soap.  9. Pat yourself dry with a CLEAN TOWEL.  10. Wear CLEAN PAJAMAS to bed the night before surgery, wear comfortable clothes the morning of surgery  11. Place CLEAN SHEETS on your bed the night of your first shower and DO NOT SLEEP WITH PETS.  Day of Surgery:  Do not apply any deodorants/lotions.  Please wear clean clothes to the hospital/surgery center.   Remember to brush your teeth WITH YOUR REGULAR TOOTHPASTE.  Please read over the following fact sheets that you were given. Pain  Booklet, Coughing and Deep Breathing, MRSA Information and Surgical Site Infection Prevention

## 2018-06-02 ENCOUNTER — Other Ambulatory Visit: Payer: Self-pay

## 2018-06-02 ENCOUNTER — Encounter (HOSPITAL_COMMUNITY)
Admission: RE | Admit: 2018-06-02 | Discharge: 2018-06-02 | Disposition: A | Discharge status: Home / Self Care | Payer: Medicare Other | Source: Ambulatory Visit | Attending: Neurosurgery | Admitting: Neurosurgery

## 2018-06-02 ENCOUNTER — Encounter (HOSPITAL_COMMUNITY): Payer: Self-pay

## 2018-06-02 DIAGNOSIS — Z01812 Encounter for preprocedural laboratory examination: Secondary | ICD-10-CM | POA: Insufficient documentation

## 2018-06-02 HISTORY — DX: Nausea with vomiting, unspecified: R11.2

## 2018-06-02 HISTORY — DX: Other reaction to spinal and lumbar puncture: G97.1

## 2018-06-02 HISTORY — DX: Other specified postprocedural states: Z98.890

## 2018-06-02 LAB — BASIC METABOLIC PANEL
ANION GAP: 7 (ref 5–15)
BUN: 14 mg/dL (ref 8–23)
CHLORIDE: 105 mmol/L (ref 98–111)
CO2: 28 mmol/L (ref 22–32)
Calcium: 9 mg/dL (ref 8.9–10.3)
Creatinine, Ser: 0.85 mg/dL (ref 0.44–1.00)
GFR calc non Af Amer: >60 mL/min (ref >60)
Glucose, Bld: 116 mg/dL — ABNORMAL HIGH (ref 70–99)
POTASSIUM: 3.6 mmol/L (ref 3.5–5.1)
SODIUM: 140 mmol/L (ref 135–145)

## 2018-06-02 LAB — CBC
HEMATOCRIT: 38.2 % (ref 36.0–46.0)
HEMOGLOBIN: 11.8 g/dL — AB (ref 12.0–15.0)
MCH: 28.9 pg (ref 26.0–34.0)
MCHC: 30.9 g/dL (ref 30.0–36.0)
MCV: 93.6 fL (ref 78.0–100.0)
Platelets: 215 10*3/uL (ref 150–400)
RBC: 4.08 MIL/uL (ref 3.87–5.11)
RDW: 13.2 % (ref 11.5–15.5)
WBC: 5.4 10*3/uL (ref 4.0–10.5)

## 2018-06-02 LAB — SURGICAL PCR SCREEN
MRSA, PCR: NEGATIVE
Staphylococcus aureus: NEGATIVE

## 2018-06-02 NOTE — Progress Notes (Signed)
PCP - Dr. Jilda Panda  Cardiologist -  Dr. Oval Linsey  Chest x-ray - Denies  EKG - 10/21/17 (E)  Stress Test - 05/05/15 (E)  ECHO - 11/08/17 (E)  Cardiac Cath - 10/23/17 (E)  Sleep Study - Denies CPAP - None  LABS- 06/02/18: CBC, BMP, PCR  ASA- Denies  Anesthesia- No  Pt denies having chest pain, sob, or fever at this time. All instructions explained to the pt, with a verbal understanding of the material. Pt agrees to go over the instructions while at home for a better understanding. The opportunity to ask questions was provided.

## 2018-06-09 ENCOUNTER — Encounter (HOSPITAL_COMMUNITY): Payer: Self-pay

## 2018-06-09 ENCOUNTER — Inpatient Hospital Stay (HOSPITAL_COMMUNITY): Payer: Medicare Other

## 2018-06-09 ENCOUNTER — Inpatient Hospital Stay (HOSPITAL_COMMUNITY): Payer: Medicare Other | Admitting: Anesthesiology

## 2018-06-09 ENCOUNTER — Encounter (HOSPITAL_COMMUNITY)
Admission: RE | Disposition: A | Discharge status: Home Health | Payer: Self-pay | Source: Ambulatory Visit | Attending: Neurosurgery

## 2018-06-09 ENCOUNTER — Inpatient Hospital Stay (HOSPITAL_COMMUNITY)
Admission: RE | Admit: 2018-06-09 | Discharge: 2018-06-17 | DRG: 516 | Disposition: A | Discharge status: Home Health | Payer: Medicare Other | Source: Ambulatory Visit | Attending: Neurosurgery | Admitting: Neurosurgery

## 2018-06-09 ENCOUNTER — Other Ambulatory Visit: Payer: Self-pay

## 2018-06-09 DIAGNOSIS — Z823 Family history of stroke: Secondary | ICD-10-CM

## 2018-06-09 DIAGNOSIS — G8918 Other acute postprocedural pain: Secondary | ICD-10-CM

## 2018-06-09 DIAGNOSIS — Z90710 Acquired absence of both cervix and uterus: Secondary | ICD-10-CM

## 2018-06-09 DIAGNOSIS — K219 Gastro-esophageal reflux disease without esophagitis: Secondary | ICD-10-CM | POA: Diagnosis present

## 2018-06-09 DIAGNOSIS — Z806 Family history of leukemia: Secondary | ICD-10-CM

## 2018-06-09 DIAGNOSIS — Z8711 Personal history of peptic ulcer disease: Secondary | ICD-10-CM

## 2018-06-09 DIAGNOSIS — Z8249 Family history of ischemic heart disease and other diseases of the circulatory system: Secondary | ICD-10-CM | POA: Diagnosis not present

## 2018-06-09 DIAGNOSIS — Z9842 Cataract extraction status, left eye: Secondary | ICD-10-CM

## 2018-06-09 DIAGNOSIS — I10 Essential (primary) hypertension: Secondary | ICD-10-CM

## 2018-06-09 DIAGNOSIS — Z803 Family history of malignant neoplasm of breast: Secondary | ICD-10-CM

## 2018-06-09 DIAGNOSIS — Z6841 Body Mass Index (BMI) 40.0 and over, adult: Secondary | ICD-10-CM | POA: Diagnosis not present

## 2018-06-09 DIAGNOSIS — Z961 Presence of intraocular lens: Secondary | ICD-10-CM | POA: Diagnosis present

## 2018-06-09 DIAGNOSIS — Z9049 Acquired absence of other specified parts of digestive tract: Secondary | ICD-10-CM | POA: Diagnosis not present

## 2018-06-09 DIAGNOSIS — R402252 Coma scale, best verbal response, oriented, at arrival to emergency department: Secondary | ICD-10-CM | POA: Diagnosis present

## 2018-06-09 DIAGNOSIS — M5416 Radiculopathy, lumbar region: Secondary | ICD-10-CM | POA: Diagnosis present

## 2018-06-09 DIAGNOSIS — M159 Polyosteoarthritis, unspecified: Secondary | ICD-10-CM | POA: Diagnosis present

## 2018-06-09 DIAGNOSIS — R402142 Coma scale, eyes open, spontaneous, at arrival to emergency department: Secondary | ICD-10-CM | POA: Diagnosis present

## 2018-06-09 DIAGNOSIS — M5440 Lumbago with sciatica, unspecified side: Secondary | ICD-10-CM | POA: Diagnosis not present

## 2018-06-09 DIAGNOSIS — M48062 Spinal stenosis, lumbar region with neurogenic claudication: Principal | ICD-10-CM | POA: Diagnosis present

## 2018-06-09 DIAGNOSIS — D62 Acute posthemorrhagic anemia: Secondary | ICD-10-CM

## 2018-06-09 DIAGNOSIS — Z881 Allergy status to other antibiotic agents status: Secondary | ICD-10-CM | POA: Diagnosis not present

## 2018-06-09 DIAGNOSIS — Z91040 Latex allergy status: Secondary | ICD-10-CM | POA: Diagnosis not present

## 2018-06-09 DIAGNOSIS — Z7951 Long term (current) use of inhaled steroids: Secondary | ICD-10-CM | POA: Diagnosis not present

## 2018-06-09 DIAGNOSIS — M549 Dorsalgia, unspecified: Secondary | ICD-10-CM | POA: Diagnosis present

## 2018-06-09 DIAGNOSIS — M48061 Spinal stenosis, lumbar region without neurogenic claudication: Secondary | ICD-10-CM | POA: Diagnosis present

## 2018-06-09 DIAGNOSIS — Z419 Encounter for procedure for purposes other than remedying health state, unspecified: Secondary | ICD-10-CM

## 2018-06-09 DIAGNOSIS — R402362 Coma scale, best motor response, obeys commands, at arrival to emergency department: Secondary | ICD-10-CM | POA: Diagnosis present

## 2018-06-09 DIAGNOSIS — Z91048 Other nonmedicinal substance allergy status: Secondary | ICD-10-CM

## 2018-06-09 DIAGNOSIS — Z96653 Presence of artificial knee joint, bilateral: Secondary | ICD-10-CM

## 2018-06-09 DIAGNOSIS — Z833 Family history of diabetes mellitus: Secondary | ICD-10-CM

## 2018-06-09 DIAGNOSIS — Z91041 Radiographic dye allergy status: Secondary | ICD-10-CM | POA: Diagnosis not present

## 2018-06-09 DIAGNOSIS — G8929 Other chronic pain: Secondary | ICD-10-CM | POA: Diagnosis not present

## 2018-06-09 DIAGNOSIS — M8568 Other cyst of bone, other site: Secondary | ICD-10-CM | POA: Diagnosis present

## 2018-06-09 DIAGNOSIS — Z880 Allergy status to penicillin: Secondary | ICD-10-CM

## 2018-06-09 HISTORY — PX: LUMBAR LAMINECTOMY/DECOMPRESSION MICRODISCECTOMY: SHX5026

## 2018-06-09 SURGERY — LUMBAR LAMINECTOMY/DECOMPRESSION MICRODISCECTOMY 3 LEVELS
Anesthesia: General | Site: Spine Lumbar | Laterality: Bilateral

## 2018-06-09 MED ORDER — SODIUM CHLORIDE 0.9% FLUSH
3.0000 mL | Freq: Two times a day (BID) | INTRAVENOUS | Status: DC
  Administered 2018-06-09 – 2018-06-17 (×15): 3 mL via INTRAVENOUS

## 2018-06-09 MED ORDER — CEFAZOLIN SODIUM-DEXTROSE 2-4 GM/100ML-% IV SOLN
2.0000 g | Freq: Three times a day (TID) | INTRAVENOUS | Status: AC
  Administered 2018-06-09 – 2018-06-10 (×2): 2 g via INTRAVENOUS
  Filled 2018-06-09 (×2): qty 100

## 2018-06-09 MED ORDER — MENTHOL 3 MG MT LOZG
1.0000 | LOZENGE | OROMUCOSAL | Status: DC | PRN
  Administered 2018-06-10: 3 mg via ORAL
  Filled 2018-06-09: qty 9

## 2018-06-09 MED ORDER — PROPOFOL 10 MG/ML IV BOLUS
INTRAVENOUS | Status: AC
  Filled 2018-06-09: qty 20

## 2018-06-09 MED ORDER — HYDROCODONE-ACETAMINOPHEN 5-325 MG PO TABS
1.0000 | ORAL_TABLET | Freq: Four times a day (QID) | ORAL | Status: DC | PRN
  Administered 2018-06-09 – 2018-06-17 (×8): 1 via ORAL
  Filled 2018-06-09 (×8): qty 1

## 2018-06-09 MED ORDER — GABAPENTIN 300 MG PO CAPS
300.0000 mg | ORAL_CAPSULE | Freq: Two times a day (BID) | ORAL | Status: DC
  Administered 2018-06-09 – 2018-06-17 (×16): 300 mg via ORAL
  Filled 2018-06-09 (×16): qty 1

## 2018-06-09 MED ORDER — GLYCOPYRROLATE PF 0.2 MG/ML IJ SOSY
PREFILLED_SYRINGE | INTRAMUSCULAR | Status: AC
  Filled 2018-06-09: qty 3

## 2018-06-09 MED ORDER — HYDROMORPHONE HCL 1 MG/ML IJ SOLN
0.2500 mg | INTRAMUSCULAR | Status: DC | PRN
  Administered 2018-06-09 (×3): 0.5 mg via INTRAVENOUS

## 2018-06-09 MED ORDER — LIDOCAINE-EPINEPHRINE 1 %-1:100000 IJ SOLN
INTRAMUSCULAR | Status: DC | PRN
  Administered 2018-06-09: 10 mL

## 2018-06-09 MED ORDER — OXYCODONE HCL 5 MG PO TABS
10.0000 mg | ORAL_TABLET | ORAL | Status: DC | PRN
  Administered 2018-06-09 – 2018-06-17 (×30): 10 mg via ORAL
  Filled 2018-06-09 (×30): qty 2

## 2018-06-09 MED ORDER — PHENOL 1.4 % MT LIQD: 1.0000 | OROMUCOSAL | Status: DC | PRN

## 2018-06-09 MED ORDER — HYDROCODONE-ACETAMINOPHEN 5-325 MG PO TABS: 1.0000 | ORAL_TABLET | Freq: Four times a day (QID) | ORAL | Status: DC | PRN

## 2018-06-09 MED ORDER — ONDANSETRON HCL 4 MG/2ML IJ SOLN
INTRAMUSCULAR | Status: AC
  Filled 2018-06-09: qty 2

## 2018-06-09 MED ORDER — MIDAZOLAM HCL 5 MG/5ML IJ SOLN
INTRAMUSCULAR | Status: DC | PRN
  Administered 2018-06-09: 2 mg via INTRAVENOUS

## 2018-06-09 MED ORDER — VANCOMYCIN HCL IN DEXTROSE 1-5 GM/200ML-% IV SOLN
INTRAVENOUS | Status: AC
  Filled 2018-06-09: qty 200

## 2018-06-09 MED ORDER — BUPIVACAINE HCL (PF) 0.25 % IJ SOLN
INTRAMUSCULAR | Status: AC
  Filled 2018-06-09: qty 30

## 2018-06-09 MED ORDER — CYCLOBENZAPRINE HCL 10 MG PO TABS
10.0000 mg | ORAL_TABLET | Freq: Three times a day (TID) | ORAL | Status: DC | PRN
  Administered 2018-06-09 – 2018-06-16 (×6): 10 mg via ORAL
  Filled 2018-06-09 (×5): qty 1

## 2018-06-09 MED ORDER — VANCOMYCIN HCL 1000 MG IV SOLR
INTRAVENOUS | Status: AC
  Filled 2018-06-09: qty 1000

## 2018-06-09 MED ORDER — PROMETHAZINE HCL 25 MG/ML IJ SOLN: 6.2500 mg | INTRAMUSCULAR | Status: DC | PRN

## 2018-06-09 MED ORDER — VANCOMYCIN HCL 1000 MG IV SOLR
INTRAVENOUS | Status: DC | PRN
  Administered 2018-06-09: 1000 mg via INTRAVENOUS

## 2018-06-09 MED ORDER — ONDANSETRON HCL 4 MG PO TABS: 4.0000 mg | ORAL_TABLET | Freq: Four times a day (QID) | ORAL | Status: DC | PRN

## 2018-06-09 MED ORDER — ONDANSETRON HCL 4 MG/2ML IJ SOLN
INTRAMUSCULAR | Status: DC | PRN
  Administered 2018-06-09: 4 mg via INTRAVENOUS

## 2018-06-09 MED ORDER — THROMBIN 5000 UNITS EX SOLR
CUTANEOUS | Status: AC
  Filled 2018-06-09: qty 5000

## 2018-06-09 MED ORDER — FUROSEMIDE 40 MG PO TABS
40.0000 mg | ORAL_TABLET | Freq: Every day | ORAL | Status: DC
  Administered 2018-06-10 – 2018-06-17 (×6): 40 mg via ORAL
  Filled 2018-06-09 (×8): qty 1

## 2018-06-09 MED ORDER — OXYCODONE HCL 5 MG PO TABS
ORAL_TABLET | ORAL | Status: AC
  Filled 2018-06-09: qty 1

## 2018-06-09 MED ORDER — HYDROMORPHONE HCL 1 MG/ML IJ SOLN
INTRAMUSCULAR | Status: AC
  Administered 2018-06-09: 0.5 mg via INTRAVENOUS
  Filled 2018-06-09: qty 1

## 2018-06-09 MED ORDER — ALPRAZOLAM 0.25 MG PO TABS
0.2500 mg | ORAL_TABLET | Freq: Every day | ORAL | Status: DC | PRN
  Administered 2018-06-11 – 2018-06-15 (×5): 0.25 mg via ORAL
  Filled 2018-06-09 (×5): qty 1

## 2018-06-09 MED ORDER — ROCURONIUM BROMIDE 10 MG/ML (PF) SYRINGE
PREFILLED_SYRINGE | INTRAVENOUS | Status: AC
  Filled 2018-06-09: qty 10

## 2018-06-09 MED ORDER — ACETAMINOPHEN 325 MG PO TABS
650.0000 mg | ORAL_TABLET | ORAL | Status: DC | PRN
  Administered 2018-06-11 – 2018-06-15 (×7): 650 mg via ORAL
  Filled 2018-06-09 (×8): qty 2

## 2018-06-09 MED ORDER — DEXAMETHASONE SODIUM PHOSPHATE 10 MG/ML IJ SOLN
INTRAMUSCULAR | Status: AC
  Filled 2018-06-09: qty 1

## 2018-06-09 MED ORDER — HEMOSTATIC AGENTS (NO CHARGE) OPTIME
TOPICAL | Status: DC | PRN
  Administered 2018-06-09 (×2): 1 via TOPICAL

## 2018-06-09 MED ORDER — SCOPOLAMINE 1 MG/3DAYS TD PT72
MEDICATED_PATCH | TRANSDERMAL | Status: DC | PRN
  Administered 2018-06-09: 1 via TRANSDERMAL

## 2018-06-09 MED ORDER — HYDROMORPHONE HCL 1 MG/ML IJ SOLN: 0.5000 mg | INTRAMUSCULAR | Status: DC | PRN

## 2018-06-09 MED ORDER — LORATADINE 10 MG PO TABS: 10.0000 mg | ORAL_TABLET | Freq: Every day | ORAL | Status: DC

## 2018-06-09 MED ORDER — SCOPOLAMINE 1 MG/3DAYS TD PT72
MEDICATED_PATCH | TRANSDERMAL | Status: AC
  Filled 2018-06-09: qty 1

## 2018-06-09 MED ORDER — NEOSTIGMINE METHYLSULFATE 5 MG/5ML IV SOSY
PREFILLED_SYRINGE | INTRAVENOUS | Status: DC | PRN
  Administered 2018-06-09: 4 mg via INTRAVENOUS

## 2018-06-09 MED ORDER — ROCURONIUM BROMIDE 10 MG/ML (PF) SYRINGE
PREFILLED_SYRINGE | INTRAVENOUS | Status: DC | PRN
  Administered 2018-06-09: 60 mg via INTRAVENOUS

## 2018-06-09 MED ORDER — ALUM & MAG HYDROXIDE-SIMETH 200-200-20 MG/5ML PO SUSP
30.0000 mL | Freq: Four times a day (QID) | ORAL | Status: DC | PRN
  Administered 2018-06-12: 30 mL via ORAL
  Filled 2018-06-09: qty 30

## 2018-06-09 MED ORDER — CYCLOBENZAPRINE HCL 10 MG PO TABS
ORAL_TABLET | ORAL | Status: AC
  Filled 2018-06-09: qty 1

## 2018-06-09 MED ORDER — THROMBIN 5000 UNITS EX SOLR
CUTANEOUS | Status: DC | PRN
  Administered 2018-06-09 (×2): 5000 [IU] via TOPICAL

## 2018-06-09 MED ORDER — MIDAZOLAM HCL 2 MG/2ML IJ SOLN
INTRAMUSCULAR | Status: AC
  Filled 2018-06-09: qty 2

## 2018-06-09 MED ORDER — PANTOPRAZOLE SODIUM 20 MG PO TBEC
20.0000 mg | DELAYED_RELEASE_TABLET | Freq: Every day | ORAL | Status: DC
  Administered 2018-06-09 – 2018-06-16 (×8): 20 mg via ORAL
  Filled 2018-06-09 (×9): qty 1

## 2018-06-09 MED ORDER — FENTANYL CITRATE (PF) 100 MCG/2ML IJ SOLN
INTRAMUSCULAR | Status: DC | PRN
  Administered 2018-06-09 (×5): 50 ug via INTRAVENOUS

## 2018-06-09 MED ORDER — LIDOCAINE-EPINEPHRINE 1 %-1:100000 IJ SOLN
INTRAMUSCULAR | Status: AC
  Filled 2018-06-09: qty 1

## 2018-06-09 MED ORDER — SODIUM CHLORIDE 0.9 % IV SOLN
250.0000 mL | INTRAVENOUS | Status: DC
  Administered 2018-06-09: 250 mL via INTRAVENOUS

## 2018-06-09 MED ORDER — LIDOCAINE 2% (20 MG/ML) 5 ML SYRINGE
INTRAMUSCULAR | Status: DC | PRN
  Administered 2018-06-09: 60 mg via INTRAVENOUS

## 2018-06-09 MED ORDER — RAMIPRIL 2.5 MG PO CAPS
2.5000 mg | ORAL_CAPSULE | Freq: Every day | ORAL | Status: DC
  Administered 2018-06-09 – 2018-06-17 (×9): 2.5 mg via ORAL
  Filled 2018-06-09 (×10): qty 1

## 2018-06-09 MED ORDER — PROPOFOL 10 MG/ML IV BOLUS
INTRAVENOUS | Status: DC | PRN
  Administered 2018-06-09: 120 mg via INTRAVENOUS

## 2018-06-09 MED ORDER — HYDROMORPHONE HCL 1 MG/ML IJ SOLN
INTRAMUSCULAR | Status: AC
  Filled 2018-06-09: qty 1

## 2018-06-09 MED ORDER — ACETAMINOPHEN 650 MG RE SUPP: 650.0000 mg | RECTAL | Status: DC | PRN

## 2018-06-09 MED ORDER — FENTANYL CITRATE (PF) 250 MCG/5ML IJ SOLN
INTRAMUSCULAR | Status: AC
  Filled 2018-06-09: qty 5

## 2018-06-09 MED ORDER — FLUTICASONE PROPIONATE 50 MCG/ACT NA SUSP
1.0000 | Freq: Every day | NASAL | Status: DC | PRN
  Filled 2018-06-09: qty 16

## 2018-06-09 MED ORDER — SODIUM CHLORIDE 0.9 % IV SOLN
INTRAVENOUS | Status: DC | PRN
  Administered 2018-06-09: 500 mL

## 2018-06-09 MED ORDER — TRAMADOL HCL 50 MG PO TABS: 100.0000 mg | ORAL_TABLET | Freq: Four times a day (QID) | ORAL | Status: DC | PRN

## 2018-06-09 MED ORDER — LACTATED RINGERS IV SOLN
INTRAVENOUS | Status: DC
  Administered 2018-06-09 (×2): via INTRAVENOUS

## 2018-06-09 MED ORDER — DEXAMETHASONE SODIUM PHOSPHATE 10 MG/ML IJ SOLN
INTRAMUSCULAR | Status: DC | PRN
  Administered 2018-06-09: 10 mg via INTRAVENOUS

## 2018-06-09 MED ORDER — PANTOPRAZOLE SODIUM 40 MG PO TBEC: 40.0000 mg | DELAYED_RELEASE_TABLET | Freq: Every day | ORAL | Status: DC

## 2018-06-09 MED ORDER — 0.9 % SODIUM CHLORIDE (POUR BTL) OPTIME
TOPICAL | Status: DC | PRN
  Administered 2018-06-09: 1000 mL

## 2018-06-09 MED ORDER — ACETAMINOPHEN 500 MG PO TABS: 1000.0000 mg | ORAL_TABLET | Freq: Two times a day (BID) | ORAL | Status: DC | PRN

## 2018-06-09 MED ORDER — VITAMIN D (ERGOCALCIFEROL) 1.25 MG (50000 UNIT) PO CAPS
50000.0000 [IU] | ORAL_CAPSULE | ORAL | Status: DC
  Administered 2018-06-12: 50000 [IU] via ORAL
  Filled 2018-06-09: qty 1

## 2018-06-09 MED ORDER — GLYCOPYRROLATE PF 0.2 MG/ML IJ SOSY
PREFILLED_SYRINGE | INTRAMUSCULAR | Status: DC | PRN
  Administered 2018-06-09: 0.6 mg via INTRAVENOUS

## 2018-06-09 MED ORDER — NEOSTIGMINE METHYLSULFATE 5 MG/5ML IV SOSY
PREFILLED_SYRINGE | INTRAVENOUS | Status: AC
  Filled 2018-06-09: qty 5

## 2018-06-09 MED ORDER — BUPIVACAINE HCL (PF) 0.25 % IJ SOLN
INTRAMUSCULAR | Status: DC | PRN
  Administered 2018-06-09: 10 mL

## 2018-06-09 MED ORDER — CELECOXIB 200 MG PO CAPS
200.0000 mg | ORAL_CAPSULE | Freq: Every day | ORAL | Status: DC
  Administered 2018-06-09 – 2018-06-17 (×9): 200 mg via ORAL
  Filled 2018-06-09 (×10): qty 1

## 2018-06-09 MED ORDER — LIDOCAINE 2% (20 MG/ML) 5 ML SYRINGE
INTRAMUSCULAR | Status: AC
  Filled 2018-06-09: qty 5

## 2018-06-09 MED ORDER — ONDANSETRON HCL 4 MG/2ML IJ SOLN: 4.0000 mg | Freq: Four times a day (QID) | INTRAMUSCULAR | Status: DC | PRN

## 2018-06-09 MED ORDER — SODIUM CHLORIDE 0.9% FLUSH
3.0000 mL | INTRAVENOUS | Status: DC | PRN
Start: 2018-06-09 — End: 2018-06-17
  Filled 2018-06-09: qty 3

## 2018-06-09 SURGICAL SUPPLY — 60 items
BAG DECANTER FOR FLEXI CONT (MISCELLANEOUS) ×2 IMPLANT
BENZOIN TINCTURE PRP APPL 2/3 (GAUZE/BANDAGES/DRESSINGS) ×2 IMPLANT
BLADE CLIPPER SURG (BLADE) IMPLANT
BLADE SURG 11 STRL SS (BLADE) ×2 IMPLANT
BUR CUTTER 7.0 ROUND (BURR) ×2 IMPLANT
BUR MATCHSTICK NEURO 3.0 LAGG (BURR) ×2 IMPLANT
CANISTER SUCT 3000ML PPV (MISCELLANEOUS) ×2 IMPLANT
CARTRIDGE OIL MAESTRO DRILL (MISCELLANEOUS) ×1 IMPLANT
DECANTER SPIKE VIAL GLASS SM (MISCELLANEOUS) ×4 IMPLANT
DERMABOND ADVANCED (GAUZE/BANDAGES/DRESSINGS) ×1
DERMABOND ADVANCED .7 DNX12 (GAUZE/BANDAGES/DRESSINGS) ×1 IMPLANT
DIFFUSER DRILL AIR PNEUMATIC (MISCELLANEOUS) ×2 IMPLANT
DRAPE HALF SHEET 40X57 (DRAPES) IMPLANT
DRAPE LAPAROTOMY 100X72X124 (DRAPES) ×2 IMPLANT
DRAPE MICROSCOPE LEICA (MISCELLANEOUS) ×2 IMPLANT
DRAPE SURG 17X23 STRL (DRAPES) ×2 IMPLANT
DRSG OPSITE 4X5.5 SM (GAUZE/BANDAGES/DRESSINGS) ×2 IMPLANT
DRSG OPSITE POSTOP 4X6 (GAUZE/BANDAGES/DRESSINGS) ×2 IMPLANT
DURAPREP 26ML APPLICATOR (WOUND CARE) ×2 IMPLANT
ELECT BLADE 4.0 EZ CLEAN MEGAD (MISCELLANEOUS) ×2
ELECT REM PT RETURN 9FT ADLT (ELECTROSURGICAL) ×2
ELECTRODE BLDE 4.0 EZ CLN MEGD (MISCELLANEOUS) ×1 IMPLANT
ELECTRODE REM PT RTRN 9FT ADLT (ELECTROSURGICAL) ×1 IMPLANT
EVACUATOR 1/8 PVC DRAIN (DRAIN) ×2 IMPLANT
FLOSEAL 5ML (HEMOSTASIS) ×2 IMPLANT
GAUZE SPONGE 4X4 12PLY STRL (GAUZE/BANDAGES/DRESSINGS) ×2 IMPLANT
GAUZE SPONGE 4X4 16PLY XRAY LF (GAUZE/BANDAGES/DRESSINGS) ×2 IMPLANT
GLOVE BIO SURGEON STRL SZ7 (GLOVE) IMPLANT
GLOVE BIO SURGEON STRL SZ8 (GLOVE) IMPLANT
GLOVE BIOGEL PI IND STRL 7.0 (GLOVE) ×4 IMPLANT
GLOVE BIOGEL PI IND STRL 7.5 (GLOVE) ×2 IMPLANT
GLOVE BIOGEL PI INDICATOR 7.0 (GLOVE) ×4
GLOVE BIOGEL PI INDICATOR 7.5 (GLOVE) ×2
GLOVE ECLIPSE 7.5 STRL STRAW (GLOVE) IMPLANT
GLOVE EXAM NITRILE LRG STRL (GLOVE) IMPLANT
GLOVE EXAM NITRILE XL STR (GLOVE) IMPLANT
GLOVE EXAM NITRILE XS STR PU (GLOVE) IMPLANT
GLOVE INDICATOR 8.5 STRL (GLOVE) IMPLANT
GOWN STRL REUS W/ TWL LRG LVL3 (GOWN DISPOSABLE) ×1 IMPLANT
GOWN STRL REUS W/ TWL XL LVL3 (GOWN DISPOSABLE) ×3 IMPLANT
GOWN STRL REUS W/TWL 2XL LVL3 (GOWN DISPOSABLE) IMPLANT
GOWN STRL REUS W/TWL LRG LVL3 (GOWN DISPOSABLE) ×1
GOWN STRL REUS W/TWL XL LVL3 (GOWN DISPOSABLE) ×3
KIT BASIN OR (CUSTOM PROCEDURE TRAY) ×2 IMPLANT
KIT TURNOVER KIT B (KITS) ×2 IMPLANT
NEEDLE HYPO 22GX1.5 SAFETY (NEEDLE) ×2 IMPLANT
NEEDLE SPNL 22GX3.5 QUINCKE BK (NEEDLE) ×2 IMPLANT
NS IRRIG 1000ML POUR BTL (IV SOLUTION) ×2 IMPLANT
OIL CARTRIDGE MAESTRO DRILL (MISCELLANEOUS) ×2
PACK LAMINECTOMY NEURO (CUSTOM PROCEDURE TRAY) ×2 IMPLANT
RUBBERBAND STERILE (MISCELLANEOUS) ×4 IMPLANT
SPONGE SURGIFOAM ABS GEL SZ50 (HEMOSTASIS) ×2 IMPLANT
STRIP CLOSURE SKIN 1/2X4 (GAUZE/BANDAGES/DRESSINGS) ×2 IMPLANT
SUT VIC AB 0 CT1 18XCR BRD8 (SUTURE) ×1 IMPLANT
SUT VIC AB 0 CT1 8-18 (SUTURE) ×1
SUT VIC AB 2-0 CT1 18 (SUTURE) ×2 IMPLANT
SUT VIC AB 4-0 PS2 27 (SUTURE) ×2 IMPLANT
TOWEL GREEN STERILE (TOWEL DISPOSABLE) ×2 IMPLANT
TOWEL GREEN STERILE FF (TOWEL DISPOSABLE) ×2 IMPLANT
WATER STERILE IRR 1000ML POUR (IV SOLUTION) ×2 IMPLANT

## 2018-06-09 NOTE — Transfer of Care (Signed)
Immediate Anesthesia Transfer of Care Note  Patient: Wendy Carrillo  Procedure(s) Performed: Laminectomy and Foraminotomy - Lumbar Two,Lumbar Three - Lumbar One- Two - Lumbar Three-Four- bilateral (Bilateral Spine Lumbar)  Patient Location: PACU  Anesthesia Type:General  Level of Consciousness: patient cooperative and responds to stimulation  Airway & Oxygen Therapy: Patient Spontanous Breathing and Patient connected to nasal cannula oxygen  Post-op Assessment: Report given to RN and Post -op Vital signs reviewed and stable  Post vital signs: Reviewed and stable  Last Vitals:  Vitals Value Taken Time  BP 109/88 06/09/2018 11:16 AM  Temp    Pulse    Resp 26 06/09/2018 11:18 AM  SpO2    Vitals shown include unvalidated device data.  Last Pain:  Vitals:   06/09/18 0658  TempSrc:   PainSc: 0-No pain      Patients Stated Pain Goal: 3 (42/59/56 [id number])  Complications: No apparent anesthesia complications

## 2018-06-09 NOTE — Anesthesia Postprocedure Evaluation (Signed)
Anesthesia Post Note  Patient: Wendy Carrillo  Procedure(s) Performed: Laminectomy and Foraminotomy - Lumbar Two,Lumbar Three - Lumbar One- Two - Lumbar Three-Four- bilateral (Bilateral Spine Lumbar)     Patient location during evaluation: PACU Anesthesia Type: General Level of consciousness: awake and alert Pain management: pain level controlled Vital Signs Assessment: post-procedure vital signs reviewed and stable Respiratory status: spontaneous breathing, nonlabored ventilation, respiratory function stable and patient connected to nasal cannula oxygen Cardiovascular status: blood pressure returned to baseline and stable Postop Assessment: no apparent nausea or vomiting Anesthetic complications: no    Last Vitals:  Vitals:   06/09/18 1215 06/09/18 1230  BP: 132/80 120/63  Pulse:    Resp: (!) 24 19  Temp:    SpO2:  97%    Last Pain:  Vitals:   06/09/18 1245  TempSrc:   PainSc: 5                  Helen Winterhalter S

## 2018-06-09 NOTE — H&P (Signed)
Wendy Carrillo is an 77 y.o. female.   Chief Complaint: back and bilateral leg pain left greater than right HPI: 77 year old female with back pain and neurogenic claudication severe pain radiating into the tops medial aspect of her thighs. Workup revealed severe spinal stenosis and progressive spinal stenosis primarily at L2-3 but also lobe at L1-2 and L3-4. Due to patient's progression conical syndrome imaging findings of a conservative treatment I recommended decompressive laminectomy at L1-L2 3 and L3-4. I've extensively gone up the risks and benefits of the operation with her as well as perioperative course expectations of outcome and alternatives of surgery and she understands and agrees to proceed forward.  Past Medical History:  Diagnosis Date  . Arthritis    "all over"  . Chronic lower back pain   . Coronary artery calcification 10/21/2017  . Family history of adverse reaction to anesthesia    "one of my son's passed out"  . Gastritis   . GERD (gastroesophageal reflux disease)   . 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 History of blood transfusion 1992   "related to knee replacement"  . History of duodenal ulcer   . Hypertension   . PONV (postoperative nausea and vomiting)   . Spinal headache   . Spinal stenosis, lumbar     Past Surgical History:  Procedure Laterality Date  . ABDOMINAL HERNIA REPAIR    . ANTERIOR AND POSTERIOR VAGINAL REPAIR  2015  . APPENDECTOMY  ?1980's  . BLADDER SUSPENSION    . BREAST CYST EXCISION Bilateral   . BREAST SURGERY Left    "nipple taken off"  . CARDIAC CATHETERIZATION    . CARPAL TUNNEL RELEASE Bilateral 2010's  . CATARACT EXTRACTION W/ INTRAOCULAR LENS IMPLANT Left 2014  . CHOLECYSTECTOMY OPEN  1980's  . DILATION AND CURETTAGE OF UTERUS  "several"  . HERNIA REPAIR    . JOINT REPLACEMENT    . LEFT HEART CATH AND CORONARY ANGIOGRAPHY N/A 10/23/2017   Procedure: LEFT HEART  CATH AND CORONARY ANGIOGRAPHY;  Surgeon: Troy Sine, MD;  Location: [facility];  Service: Cardiovascular;  Laterality: N/A;  . PATELLA FRACTURE SURGERY Left "after 1992"   "after the replacement"  . REDUCTION MAMMAPLASTY Bilateral   . REPLACEMENT TOTAL KNEE Bilateral    "left in 1992; right in 2001"  . VAGINAL HYSTERECTOMY  1972    Family History  Problem Relation Age of Onset  . Hypertension Mother   . Stroke Mother   . Diabetes Mother   . Heart attack Father   . Breast cancer Sister   . Leukemia Brother   . Breast cancer Sister   . Breast cancer Sister   . Heart disease Brother    Social History:  reports that she has never smoked. She has never used smokeless tobacco. She reports that she does not drink alcohol or use drugs.  Allergies:  Allergies  Allergen Reactions  . Contrast Media [Iodinated Diagnostic Agents] Swelling and Rash    Face, tongue  . Penicillins Rash and Other (See Comments)    PATIENT HAS HAD A PCN REACTION WITH IMMEDIATE RASH, FACIAL/TONGUE/THROAT SWELLING, SOB, OR LIGHTHEADEDNESS WITH HYPOTENSION:  #  #  YES  #  #  Has patient had a PCN reaction causing severe rash involving mucus membranes or skin necrosis: NO Has patient had a PCN reaction that required hospitalization NO Has patient had a PCN reaction occurring  within the last 10 years: NO If all of the above answers are "NO", then may proceed with Cephalosporin use.  . Baclofen Itching  . Latex Rash  . Other Dermatitis    Bandages caused redness     Medications Prior to Admission  Medication Sig Dispense Refill  . acetaminophen (TYLENOL) 500 MG tablet Take 1,000 mg by mouth 2 (two) times daily as needed for moderate pain.    Marland Kitchen ALPRAZolam (XANAX) 0.25 MG tablet Take 1 tablet (0.25 mg total) by mouth daily as needed. (Patient taking differently: Take 0.25 mg by mouth daily as needed for anxiety. ) 30 tablet 0  . celecoxib (CELEBREX) 200 MG capsule Take 200 mg by mouth daily.     .  cetirizine (ZYRTEC) 10 MG tablet Take 10 mg by mouth every evening.   5  . fluticasone (FLONASE) 50 MCG/ACT nasal spray Place 1 spray into both nostrils daily as needed for allergies.     . furosemide (LASIX) 40 MG tablet Take 40 mg by mouth daily.    Marland Kitchen gabapentin (NEURONTIN) 300 MG capsule Take 300 mg by mouth 2 (two) times daily.     Marland Kitchen HYDROcodone-acetaminophen (NORCO/VICODIN) 5-325 MG tablet 1 po q 6 hrs prn pain (Patient taking differently: Take 1 tablet by mouth every 6 (six) hours as needed (for pain.). ) 30 tablet 0  . pantoprazole (PROTONIX) 20 MG tablet Take 1 tablet (20 mg total) by mouth daily. (Patient taking differently: Take 20 mg by mouth at bedtime. ) 30 tablet 0  . ramipril (ALTACE) 2.5 MG capsule Take 2.5 mg by mouth daily.  5  . ranitidine (ZANTAC) 300 MG tablet Take 300 mg by mouth daily before breakfast.   1  . traMADol (ULTRAM) 50 MG tablet Take 100 mg by mouth every 6 (six) hours as needed for severe pain.   0  . Vitamin D, Ergocalciferol, (DRISDOL) 50000 UNITS CAPS capsule Take 50,000 Units by mouth every Thursday.   5    No results found for this or any previous visit (from the past 48 hour(s)). No results found.  Review of Systems  Musculoskeletal: Positive for back pain.  Neurological: Positive for sensory change and focal weakness.    Blood pressure 132/61, pulse 79, temperature 97.7 F (36.5 C), temperature source Oral, resp. rate 20, height 5' 4.5" (1.638 m), weight 112.9 kg (249 lb), SpO2 97 %. Physical Exam  Constitutional: She is oriented to person, place, and time. She appears well-developed.  HENT:  Head: Normocephalic.  Eyes: Pupils are equal, round, and reactive to light.  Neck: Normal range of motion.  Respiratory: Effort normal.  GI: Soft. Bowel sounds are normal.  Neurological: She is alert and oriented to person, place, and time. She has normal strength. GCS eye subscore is 4. GCS verbal subscore is 5. GCS motor subscore is 6.  strength is 5 out  of 5 iliopsoas, quads, hip she's, gastric, and tibialis, EHL.  Skin: Skin is warm and dry.     Assessment/Plan 77 year female presents for a decompressive laminectomy L1-2, L2-3, L3-4.  Wendy Carrillo P, MD 06/09/2018, 8:27 AM

## 2018-06-09 NOTE — Plan of Care (Signed)
  Problem: Education: Goal: Ability to verbalize activity precautions or restrictions will improve Outcome: Progressing Goal: Knowledge of the prescribed therapeutic regimen will improve Outcome: Progressing Goal: Understanding of discharge needs will improve Outcome: Progressing   

## 2018-06-09 NOTE — Anesthesia Preprocedure Evaluation (Signed)
Anesthesia Evaluation  Patient identified by MRN, date of birth, ID band Patient awake    Reviewed: Allergy & Precautions, NPO status , Patient's Chart, lab work & pertinent test results  History of Anesthesia Complications (+) PONV  Airway Mallampati: III  TM Distance: <3 FB Neck ROM: Full    Dental no notable dental hx.    Pulmonary neg pulmonary ROS,    Pulmonary exam normal breath sounds clear to auscultation       Cardiovascular hypertension, negative cardio ROS Normal cardiovascular exam Rhythm:Regular Rate:Normal     Neuro/Psych negative neurological ROS  negative psych ROS   GI/Hepatic negative GI ROS, Neg liver ROS,   Endo/Other  Morbid obesity  Renal/GU negative Renal ROS  negative genitourinary   Musculoskeletal negative musculoskeletal ROS (+)   Abdominal   Peds negative pediatric ROS (+)  Hematology negative hematology ROS (+)   Anesthesia Other Findings   Reproductive/Obstetrics negative OB ROS                             Anesthesia Physical Anesthesia Plan  ASA: III  Anesthesia Plan: General   Post-op Pain Management:    Induction: Intravenous  PONV Risk Score and Plan: 3 and Ondansetron, Dexamethasone, Scopolamine patch - Pre-op and Treatment may vary due to age or medical condition  Airway Management Planned: Oral ETT  Additional Equipment:   Intra-op Plan:   Post-operative Plan: Extubation in OR  Informed Consent: I have reviewed the patients History and Physical, chart, labs and discussed the procedure including the risks, benefits and alternatives for the proposed anesthesia with the patient or authorized representative who has indicated his/her understanding and acceptance.   Dental advisory given  Plan Discussed with: CRNA and Surgeon  Anesthesia Plan Comments:         Anesthesia Quick Evaluation

## 2018-06-09 NOTE — Op Note (Signed)
Preoperative diagnosis: Lumbar spinal stenosison the left at L1-L2 bilaterally at L2-3 and L3-4 with neurogenic claudication with radiculopathy L2-L3  Postoperative diagnosis: Same  Procedure:decompressive lumbar laminectomies bilaterally at L2-3 and L3-4 and on the left at L1-L2 with foraminotomies of the L2, L3 and L4 nerve roots.  Surgeon: Dominica Severin Markeise Mathews  Asst.: Newman Pies  Anesthesia: Gen.  EBL: 500  History of present illness: 77 year female is a long-standing back pain and bilateral hip and leg pain with neurogenic claudication of her very short distances. Workup revealed severe spinal stenosis that a progress significant primarily at L2-3 but also at L1-L2 on the left L3-4 bilaterally. Due to patient's progression of clinical syndrome imaging findings and failure conservative treatment I recommended decompressive laminectomies on the left at L1 and L2 and bilaterally at L2-3 and L3-4. I extensively went over the risks and benefits of the operation with the patient as well as perioperative course expectations of outcome and alternatives of surgery and she understood and agreed to proceed forward.  Operative procedure: Patient brought into the or was induced under general anesthesia positioned prone the Wilson frame her back was prepped and draped in routine sterile fashion preoperative x-ray was under identifiable so using anatomic landmarks and I selected with I would be centered around L1-L2 infiltrated with 10 mL lidocaine with epi a midline incision made and Bovie light cautery was used to take down the subcutaneous tissues and subperiosteal dissections care lamina of L1 and L2 which was confirmed by interoperative x-ray. Then I continued to expose L2-3 and L3-4 replaced self-retaining retractors. Then the spinous processes were removed completely at L2 and L3 as well as a super aspect of the L4 and inferior aspect of L1. Central laminotomy was begun there was noted marked hourglass  compression thecal sac primarily at L2-3 and L3-4 virtually kissing facets with significant medial overgrowth of the facet complex at 2334. This is all drilled down carefully with a high-speed drill preserve the integrity of the facet joints.then marching underneath and lateral and underneath the gutter I freed up extensive amount of osteophyte causing severe compression of the foramina at L2 and L3 primarily but also did a foraminotomy on the left at L2 and bilaterally at L3 and L4. Then at the end of decompression there is no further stenosis either centrally or foraminally the wounds and copiously irrigated meticulous he states was maintained there was a small little bubble of split-thickness dural tear with no leaking  Of CSF.placed Gelfoam overlaid top of the dura placed a medium Hemovac drain and closed the wound in layers with interrupted Vicryl and a running 4 subcuticular. Dermabond benzo and Steri-Strips and sterile dressings applied patient recovered in stable condition. At the end of case all needle counts sponge counts were correct.

## 2018-06-09 NOTE — Evaluation (Signed)
Occupational Therapy Evaluation Patient Details Name: Wendy Carrillo MRN: [id number] DOB: 1940/12/02 Today's Date: 06/09/2018    History of Present Illness  77 yo female s/p lumbar decompression L1-L4. PMH including arthritis, GERD, HTN, hepatitis, and Bil TKA.   Clinical Impression   PTA, pt was living with herh usband and was performing ADLs and simple IADLs; pt using cane and then RW for functional mobility due to back pain. Currently, pt requires Min A for UB ADLs, Max A for LB ADLs, and Mod A for functional transfers with [facility] for mobility using RW. Provided education on back precautions, bed mobility, and toileting. Pt would benefit from further acute OT to facilitate safe dc. Recommend dc to SNF for further OT to optimize safety, independence with ADLs, and return to PLOF. Pt may progress to home with  [place], but with only husband home for support feel pt is risk for fall or decreased adherence to back precautions.      Follow Up Recommendations  SNF;Supervision/Assistance - 24 hour    Equipment Recommendations  3 in 1 bedside commode    Recommendations for Other Services PT consult     Precautions / Restrictions Precautions Precautions: Back Precaution Booklet Issued: Yes (comment) Precaution Comments: Reviewed back precautions. Pt requiring cues to recall. Required Braces or Orthoses: Other Brace/Splint Other Brace/Splint: No brace per MD order Restrictions Weight Bearing Restrictions: No      Mobility Bed Mobility Overal bed mobility: Needs Assistance Bed Mobility: Rolling;Sidelying to Sit Rolling: Mod assist Sidelying to sit: Mod assist       General bed mobility comments: Mod A to roll towards right and then Mod A to elevate trunk into sitting  Transfers Overall transfer level: Needs assistance Equipment used: Rolling walker (2 wheeled) Transfers: Sit to/from Stand Sit to Stand: Mod assist         General transfer comment: Mod A to power up into  standing    Balance Overall balance assessment: Needs assistance Sitting-balance support: No upper extremity supported;Feet supported Sitting balance-Leahy Scale: Fair     Standing balance support: During functional activity;No upper extremity supported Standing balance-Leahy Scale: Poor Standing balance comment: reliant on physical A or UE support                           ADL either performed or assessed with clinical judgement   ADL Overall ADL's : Needs assistance/impaired Eating/Feeding: Set up;Sitting   Grooming: Wash/dry hands;Min guard;Standing Grooming Details (indicate cue type and reason): Min Guard for safety standing at sink Upper Body Bathing: Minimal assistance;Sitting   Lower Body Bathing: Maximal assistance;Sit to/from stand   Upper Body Dressing : Minimal assistance;Sitting   Lower Body Dressing: Maximal assistance;Sit to/from stand   Toilet Transfer: Moderate assistance;RW;Regular Toilet;Ambulation;Grab bars Toilet Transfer Details (indicate cue type and reason): Mod A to power up from toilet seat.  Toileting- Clothing Manipulation and Hygiene: Minimal assistance;Sit to/from stand Toileting - Clothing Manipulation Details (indicate cue type and reason): Min A for standing balance during toilet hygiene     Functional mobility during ADLs: Min guard;Rolling walker(Significant amount of time) General ADL Comments: Pt with decreased balance and requiring significant amount of time throughout session     Vision         Perception     Praxis      Pertinent Vitals/Pain Pain Assessment: Faces Faces Pain Scale: Hurts even more Pain Location: Back Pain Descriptors / Indicators: Constant;Grimacing;Discomfort Pain Intervention(s):  Limited activity within patient's tolerance;Monitored during session;Premedicated before session;Repositioned     Hand Dominance Right   Extremity/Trunk Assessment Upper Extremity Assessment Upper Extremity  Assessment: Generalized weakness   Lower Extremity Assessment Lower Extremity Assessment: Generalized weakness   Cervical / Trunk Assessment Cervical / Trunk Assessment: Other exceptions Cervical / Trunk Exceptions: s/p spinal sx   Communication Communication Communication: No difficulties   Cognition Arousal/Alertness: Lethargic;Suspect due to medications Behavior During Therapy: Flat affect Overall Cognitive Status: Impaired/Different from baseline Area of Impairment: Following commands;Problem solving;Awareness                       Following Commands: Follows one step commands with increased time   Awareness: Emergent Problem Solving: Slow processing;Requires verbal cues General Comments: Pt closing her eyes throughout and moving very slowly. Requiring increased time for problem solving   General Comments  Family present during session    Exercises     Shoulder Instructions      Home Living Family/patient expects to be discharged to:: Private residence Living Arrangements: Spouse/significant other Available Help at Discharge: Family;Available 24 hours/day Type of Home: House       Home Layout: One level     Bathroom Shower/Tub: Tub/shower unit;Curtain   Biochemist, clinical: Standard     Home Equipment: Environmental consultant - 2 wheels;Tub bench          Prior Functioning/Environment Level of Independence: Needs assistance  Gait / Transfers Assistance Needed: Used cane and then RW prior to surgery ADL's / Homemaking Assistance Needed: Increased time and effort for dressing. Sponge bathed at sink. Simple IADLs.    Comments: Husband sick and not able to provide assistance        OT Problem List: Decreased strength;Decreased range of motion;Decreased activity tolerance;Impaired balance (sitting and/or standing);Decreased knowledge of use of DME or AE;Decreased knowledge of precautions;Pain      OT Treatment/Interventions: Self-care/ADL training;Therapeutic  exercise;Energy conservation;DME and/or AE instruction;Therapeutic activities;Patient/family education    OT Goals(Current goals can be found in the care plan section) Acute Rehab OT Goals Patient Stated Goal: "Go to rehab" OT Goal Formulation: With patient Time For Goal Achievement: 06/23/18 Potential to Achieve Goals: Good ADL Goals Pt Will Perform Lower Body Dressing: with set-up;with supervision;with adaptive equipment;sit to/from stand Pt Will Transfer to Toilet: with supervision;with set-up;ambulating;bedside commode Pt Will Perform Toileting - Clothing Manipulation and hygiene: with set-up;with supervision;sit to/from stand Additional ADL Goal #1: Pt will perform bed mobility with supervision in preparation for ADLs Additional ADL Goal #2: Pt will verbalize 3/3 back precautions independently  OT Frequency: Min 2X/week   Barriers to D/C: Decreased caregiver support  Only support is husband who is sick per family and pt       Co-evaluation              AM-PAC PT "6 Clicks" Daily Activity     Outcome Measure Help from another person eating meals?: None Help from another person taking care of personal grooming?: A Little Help from another person toileting, which includes using toliet, bedpan, or urinal?: A Lot Help from another person bathing (including washing, rinsing, drying)?: A Lot Help from another person to put on and taking off regular upper body clothing?: A Little Help from another person to put on and taking off regular lower body clothing?: A Lot 6 Click Score: 16   End of Session Equipment Utilized During Treatment: Gait belt;Rolling walker Nurse Communication: Mobility status;Weight bearing status  Activity Tolerance: Patient limited  by lethargy;Patient limited by pain Patient left: in chair;with call bell/phone within reach;with family/visitor present  OT Visit Diagnosis: Unsteadiness on feet (R26.81);Other abnormalities of gait and mobility  (R26.89);Muscle weakness (generalized) (M62.81);Pain Pain - part of body: (Back)                Time: 0175-1025 OT Time Calculation (min): 31 min Charges:  OT General Charges $OT Visit: 1 Visit OT Evaluation $OT Eval Moderate Complexity: 1 Mod OT Treatments $Self Care/Home Management : 8-22 mins  Alonia Dibuono MSOT, OTR/L Acute Rehab Pager: [phone] Office: [place] 06/09/2018, 5:08 PM

## 2018-06-10 ENCOUNTER — Encounter (HOSPITAL_COMMUNITY): Payer: Self-pay | Admitting: Neurosurgery

## 2018-06-10 NOTE — Evaluation (Signed)
Physical Therapy Evaluation Patient Details Name: Wendy Carrillo MRN: [id number] DOB: 1941-10-28 Today's Date: 06/10/2018   History of Present Illness   77 yo female s/p lumbar decompression L1-L4. PMH including arthritis, GERD, HTN, hepatitis, and Bil TKA.   Clinical Impression  Pt admitted with above diagnosis. Pt currently with functional limitations due to the deficits listed below (see PT Problem List). At the time of PT eval pt was able to perform transfers and ambulation with gross min assist for balance support, safety, and walker management. Pt with limited support at home, and feel she could benefit from continued therapy prior to return home with husband. Pt's family present during session and report they have already set up for pt to go to inpatient rehab prior to surgery. Attempted to explain typical process of an inpatient rehab admission, however pt's daughter states they have already gotten insurance approval and spoken with someone from CIR. While I think the patient could benefit from the increased frequency of therapy at the CIR level, unsure pt will be able to tolerate 3 hours/day of PT/OT based on her performance today. Will see if activity tolerance is improved next session and update recommendations if appropriate. Per family report, if CIR denies pt, an acceptable "plan B" would be SNF. Acutely, pt will benefit from skilled PT to increase their independence and safety with mobility to allow discharge to the venue listed below.       Follow Up Recommendations CIR;Supervision/Assistance - 24 hour    Equipment Recommendations  Rolling walker with 5" wheels    Recommendations for Other Services Rehab consult     Precautions / Restrictions Precautions Precautions: Fall;Back Precaution Comments: Pt was cued for precautions during functional mobility Other Brace/Splint: No brace per MD order Restrictions Weight Bearing Restrictions: No      Mobility  Bed  Mobility Overal bed mobility: Needs Assistance Bed Mobility: Rolling;Sidelying to Sit Rolling: Mod assist Sidelying to sit: Min assist       General bed mobility comments: Pt required mod A for rolling as the bed pad was used to facilitate roll. Multimodal cues and min A required to complete log roll from sidelying<>sit, with instructions for hand placement for pushing off of bed and bringing legs off of bed. Also needed VC for back precautions.  Transfers Overall transfer level: Needs assistance Equipment used: Rolling walker (2 wheeled) Transfers: Sit to/from Stand Sit to Stand: Min assist         General transfer comment: Min A for hand placement to push from bed instead of pulling up from RW and powering up to stand.   Ambulation/Gait Ambulation/Gait assistance: Min assist Gait Distance (Feet): 100 Feet Assistive device: Rolling walker (2 wheeled) Gait Pattern/deviations: Step-to pattern;Step-through pattern;Decreased stride length;Shuffle Gait velocity: decreased Gait velocity interpretation: <1.8 ft/sec, indicate of risk for recurrent falls General Gait Details: Pt required verbal cues to increase step length while ambulating and to remain in RW. Pt required increased time with turns as she reported mild dizziness (which she has at baseline). Pt also got SOB at the end of ambulation requring VC for breathing.   Stairs            Wheelchair Mobility    Modified Rankin (Stroke Patients Only)       Balance Overall balance assessment: Needs assistance Sitting-balance support: No upper extremity supported;Feet supported Sitting balance-Leahy Scale: Fair     Standing balance support: During functional activity;No upper extremity supported Standing balance-Leahy Scale: Poor Standing balance comment:  reliant on physical A or UE support                             Pertinent Vitals/Pain Pain Assessment: Faces Faces Pain Scale: Hurts even more Pain  Location: Back Pain Descriptors / Indicators: Constant;Grimacing;Discomfort Pain Intervention(s): Monitored during session;Limited activity within patient's tolerance;Repositioned    Home Living Family/patient expects to be discharged to:: Private residence Living Arrangements: Spouse/significant other Available Help at Discharge: Family;Available 24 hours/day Type of Home: House Home Access: Stairs to enter   [employer] of Steps: 1 Home Layout: One level Home Equipment: Walker - 2 wheels;Tub bench      Prior Function Level of Independence: Needs assistance   Gait / Transfers Assistance Needed: Used cane and then RW prior to surgery  ADL's / Homemaking Assistance Needed: Increased time and effort for dressing. Sponge bathed at sink. Simple IADLs.   Comments: Husband sick and with decreased mobility. Not able to provide assistance     Hand Dominance   Dominant Hand: Right    Extremity/Trunk Assessment   Upper Extremity Assessment Upper Extremity Assessment: Generalized weakness    Lower Extremity Assessment Lower Extremity Assessment: Generalized weakness    Cervical / Trunk Assessment Cervical / Trunk Assessment: Other exceptions Cervical / Trunk Exceptions: s/p spinal sx  Communication   Communication: No difficulties  Cognition Arousal/Alertness: Lethargic;Suspect due to medications Behavior During Therapy: Flat affect Overall Cognitive Status: Impaired/Different from baseline Area of Impairment: Following commands;Problem solving;Awareness                       Following Commands: Follows one step commands with increased time   Awareness: Emergent Problem Solving: Slow processing;Requires verbal cues        General Comments      Exercises     Assessment/Plan    PT Assessment Patient needs continued PT services  PT Problem List Decreased strength;Decreased activity tolerance;Decreased balance;Decreased mobility;Decreased  knowledge of use of DME;Decreased safety awareness;Decreased knowledge of precautions;Pain       PT Treatment Interventions DME instruction;Gait training;Stair training;Functional mobility training;Therapeutic activities;Therapeutic exercise;Neuromuscular re-education;Patient/family education    PT Goals (Current goals can be found in the Care Plan section)  Acute Rehab PT Goals Patient Stated Goal: Family's goal is for pt to d/c to CIR prior to return home with husband. Pt stating she can go straight home and does not want to go to another facility. PT Goal Formulation: With patient/family Time For Goal Achievement: 06/24/18 Potential to Achieve Goals: Good    Frequency Min 5X/week   Barriers to discharge        Co-evaluation               AM-PAC PT "6 Clicks" Daily Activity  Outcome Measure Difficulty turning over in bed (including adjusting bedclothes, sheets and blankets)?: Unable Difficulty moving from lying on back to sitting on the side of the bed? : Unable Difficulty sitting down on and standing up from a chair with arms (e.g., wheelchair, bedside commode, etc,.)?: Unable Help needed moving to and from a bed to chair (including a wheelchair)?: A Little Help needed walking in hospital room?: A Little Help needed climbing 3-5 steps with a railing? : A Lot 6 Click Score: 11    End of Session Equipment Utilized During Treatment: Gait belt Activity Tolerance: Patient limited by fatigue Patient left: in chair;with call bell/phone within reach;with family/visitor present Nurse Communication: Mobility status  PT Visit Diagnosis: Unsteadiness on feet (R26.81);Pain;Muscle weakness (generalized) (M62.81) Pain - part of body: (back)    Time: 1410-1449 PT Time Calculation (min) (ACUTE ONLY): 39 min   Charges:   PT Evaluation $PT Eval Moderate Complexity: 1 Mod PT Treatments $Gait Training: 23-37 mins        Rolinda Roan, PT, DPT Acute Rehabilitation  Services Pager: [place] 06/10/2018, 3:15 PM

## 2018-06-10 NOTE — Progress Notes (Signed)
Patient bought into unit @0700  hrs via bed alert and oriented x 4, with son by bedside.

## 2018-06-10 NOTE — Progress Notes (Addendum)
NEUROSURGERY PROGRESS NOTE  Doing well. Complains of appropriate backsoreness. Some pain on the lateral part of her left knee. Ambulating and voiding well Incision CDI  Temp:  [97.7 F (36.5 C)-98.3 F (36.8 C)] 98.2 F (36.8 C) (08/06 0509) Pulse Rate:  [87-99] 89 (08/06 0509) Resp:  [11-24] 18 (08/06 0509) BP: (96-144)/(54-94) 102/63 (08/06 0509) SpO2:  [97 %-100 %] 100 % (08/06 0509)  Plan: Therapies today, await their recom.  Wendy Chiquito, NP 06/10/2018 9:03 AM  Agree with above. Patient seems to be doing well with condition of back pain and increased ambulatory abilities. Patient does request inpatient rehabilitation locally.

## 2018-06-10 NOTE — Progress Notes (Signed)
Rehab Admissions Coordinator Note:  Per PT recommendation, patient was screened by Jhonnie Garner for appropriateness for an Inpatient Acute Rehab Consult.  At this time, we are recommending Inpatient Rehab consult. AC will contact MD for IP rehab consult order. Please call if questions.   Jhonnie Garner 06/10/2018, 3:59 PM  I can be reached at [phone].

## 2018-06-10 NOTE — Progress Notes (Signed)
Reports called in to admitting RN on 3W; pt will be transferred via bed shortly. No c/o of acute pain, distress or discomfort. Family member and belongings will be brought together with her.

## 2018-06-10 NOTE — Progress Notes (Signed)
PT Cancellation Note  Patient Details Name: Wendy Carrillo MRN: [id number] DOB: 1941/01/15   Cancelled Treatment:    Reason Eval/Treat Not Completed: Patient declined participating with PT eval at this time. States she would like to finish eating her lunch prior to getting up with therapy. Encouraged OOB and position change to chair to finish her lunch in a more upright position, however pt continued to refuse. Will check back as schedule allows to complete PT eval.    Thelma Comp 06/10/2018, 11:44 AM   Rolinda Roan, PT, DPT Acute Rehabilitation Services Pager: [phone]

## 2018-06-11 DIAGNOSIS — D62 Acute posthemorrhagic anemia: Secondary | ICD-10-CM

## 2018-06-11 DIAGNOSIS — M5440 Lumbago with sciatica, unspecified side: Secondary | ICD-10-CM

## 2018-06-11 DIAGNOSIS — M48062 Spinal stenosis, lumbar region with neurogenic claudication: Principal | ICD-10-CM

## 2018-06-11 DIAGNOSIS — I10 Essential (primary) hypertension: Secondary | ICD-10-CM

## 2018-06-11 DIAGNOSIS — G8929 Other chronic pain: Secondary | ICD-10-CM

## 2018-06-11 DIAGNOSIS — G8918 Other acute postprocedural pain: Secondary | ICD-10-CM

## 2018-06-11 DIAGNOSIS — Z96653 Presence of artificial knee joint, bilateral: Secondary | ICD-10-CM

## 2018-06-11 NOTE — Progress Notes (Signed)
NEUROSURGERY PROGRESS NOTE  Doing well. Complains of appropriate back soreness.  Ambulating and voiding well Good strength and sensation Incision CDI  Temp:  [97.4 F (36.3 C)-98.5 F (36.9 C)] 98.5 F (36.9 C) (08/07 0734) Pulse Rate:  [75-85] 75 (08/07 0734) Resp:  [16-18] 17 (08/07 0734) BP: (110-148)/(61-110) 117/63 (08/07 0734) SpO2:  [96 %-100 %] 100 % (08/07 0734)  Plan: Continue therapies. Await CIR consult.   Eleonore Chiquito, NP 06/11/2018 9:28 AM

## 2018-06-11 NOTE — Progress Notes (Signed)
Occupational Therapy Treatment Patient Details Name: Wendy Carrillo MRN: [id number] DOB: 1941/09/20 Today's Date: 06/11/2018    History of present illness  77 yo female s/p lumbar decompression L1-L4. PMH including arthritis, GERD, HTN, hepatitis, and Bil TKA.    OT comments  Pt progressing towards OT goals this session. Improved cognition, Pt motivated and family present throughout session - very supportive. Session focused on toilet transfer (min guard assist), peri care (min guard for front peri care), sink level grooming activity (min guard), and education/practice with AE kit for LB ADL. Ot changed recommendation to CIR level therapy due to improvement in cognition and increased ability to participate in therapies. OT will continue to follow acutely.    Follow Up Recommendations  CIR;Supervision/Assistance - 24 hour    Equipment Recommendations  3 in 1 bedside commode;Other (comment)(defer to next venue)    Recommendations for Other Services Rehab consult;PT consult    Precautions / Restrictions Precautions Precautions: Fall;Back Precaution Comments: Pt able to recall 2/3 precautions, re-education provided Other Brace/Splint: No brace per MD order Restrictions Weight Bearing Restrictions: No       Mobility Bed Mobility               General bed mobility comments: Pt returned to chair at end of session recieved on commode  Transfers Overall transfer level: Needs assistance Equipment used: Rolling walker (2 wheeled) Transfers: Sit to/from Stand Sit to Stand: Min guard;Min assist         General transfer comment: min guard assist for boost and initial balance, vc for safe hand placement    Balance Overall balance assessment: Needs assistance Sitting-balance support: No upper extremity supported;Feet supported Sitting balance-Leahy Scale: Fair     Standing balance support: During functional activity;No upper extremity supported Standing balance-Leahy  Scale: Poor Standing balance comment: dependent on RW for dynamic balance                           ADL either performed or assessed with clinical judgement   ADL Overall ADL's : Needs assistance/impaired     Grooming: Wash/dry hands;Oral care;Min guard;Standing Grooming Details (indicate cue type and reason): educated for dual cup method and setting up on right for back precautions                 Toilet Transfer: Minimal assistance;Min guard;Ambulation;RW Toilet Transfer Details (indicate cue type and reason): min guard assist for boost and balance, vc for safety with RW Toileting- Clothing Manipulation and Hygiene: Min guard;Sit to/from stand       Functional mobility during ADLs: Min guard;Rolling walker(vc for safety with RW) General ADL Comments: Pt educated in [place] with demo/practice with kit for LB ADL. Educated in long Biomedical scientist, grabber/reacher, long handle sponge, sock Oceanographer      Cognition Arousal/Alertness: Awake/alert Behavior During Therapy: WFL for tasks assessed/performed Overall Cognitive Status: Within Functional Limits for tasks assessed                                          Exercises     Shoulder Instructions       General Comments Husband and 2 sons present    Pertinent Vitals/ Pain       Pain Assessment: Faces  Faces Pain Scale: Hurts a little bit Pain Location: Back Pain Descriptors / Indicators: Grimacing;Discomfort;Sore Pain Intervention(s): Monitored during session;Repositioned  Home Living                                          Prior Functioning/Environment              Frequency  Min 3X/week        Progress Toward Goals  OT Goals(current goals can now be found in the care plan section)  Progress towards OT goals: Progressing toward goals  Acute Rehab OT Goals Patient Stated Goal: to get back to independent OT Goal  Formulation: With patient Time For Goal Achievement: 06/23/18 Potential to Achieve Goals: Good  Plan Discharge plan needs to be updated;Frequency needs to be updated    Co-evaluation                 AM-PAC PT "6 Clicks" Daily Activity     Outcome Measure   Help from another person eating meals?: None Help from another person taking care of personal grooming?: A Little Help from another person toileting, which includes using toliet, bedpan, or urinal?: A Lot Help from another person bathing (including washing, rinsing, drying)?: A Lot Help from another person to put on and taking off regular upper body clothing?: A Little Help from another person to put on and taking off regular lower body clothing?: A Lot 6 Click Score: 16    End of Session Equipment Utilized During Treatment: Gait belt;Rolling walker  OT Visit Diagnosis: Unsteadiness on feet (R26.81);Other abnormalities of gait and mobility (R26.89);Muscle weakness (generalized) (M62.81);Pain Pain - part of body: (back)   Activity Tolerance Patient tolerated treatment well   Patient Left in chair;with call bell/phone within reach;with family/visitor present   Nurse Communication Mobility status;Weight bearing status        Time: 9021-1155 OT Time Calculation (min): 22 min  Charges: OT General Charges $OT Visit: 1 Visit OT Treatments $Self Care/Home Management : 8-22 mins  Hulda Humphrey OTR/L [place] 06/11/2018, 3:48 PM

## 2018-06-11 NOTE — Consult Note (Signed)
Physical Medicine and Rehabilitation Consult Reason for Consult: Decreased functional mobility Referring Physician: Dr. Saintclair Halsted   HPI: Wendy Carrillo is a 77 y.o. right-handed female with history of hypertension, bilateral TKA, chronic low back pain.  Per chart review, patient, and family, patient lives with spouse.  One level home with one-step to entry.  Used a cane and a rolling walker.  Needed increased time and effort to complete ADLs.  Husband is limited physically due to gout.  They have a daughter in the area that works.  Presented 06/09/2018 with increasing low back pain radiating to the lower extremities.  X-rays and imaging revealed lumbar spinal stenosis L1-2 bilaterally at L2-3 and 3-4 with neurogenic claudication and radiculopathy L2-3.  Underwent decompressive lumbar laminectomies bilaterally at L2-3 and 3-4 with foraminotomies L2, L3 and L4 nerve roots 06/09/2018 per Dr. Saintclair Halsted.  No back brace required.  Hospital course pain management.  Physical and occupational therapy evaluations completed with recommendations of physical medicine rehab consult.  Review of Systems  Constitutional: Negative for chills and fever.  HENT: Negative for hearing loss.   Eyes: Negative for blurred vision and double vision.  Respiratory: Positive for shortness of breath. Negative for cough.   Cardiovascular: Positive for leg swelling. Negative for chest pain and palpitations.  Gastrointestinal: Positive for constipation. Negative for nausea and vomiting.       GERD  Musculoskeletal: Positive for back pain and myalgias.  Skin: Negative for rash.  Neurological: Positive for weakness. Negative for sensory change.  Psychiatric/Behavioral:       Anxiety  All other systems reviewed and are negative.  Past Medical History:  Diagnosis Date  . Arthritis    "all over"  . Chronic lower back pain   . Coronary artery calcification 10/21/2017  . Family history of adverse reaction to anesthesia    "one of  my son's passed out"  . Gastritis   . GERD (gastroesophageal reflux disease)   . 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 History of blood transfusion 1992   "related to knee replacement"  . History of duodenal ulcer   . Hypertension   . PONV (postoperative nausea and vomiting)   . Spinal headache   . Spinal stenosis, lumbar    Past Surgical History:  Procedure Laterality Date  . ABDOMINAL HERNIA REPAIR    . ANTERIOR AND POSTERIOR VAGINAL REPAIR  2015  . APPENDECTOMY  ?1980's  . BLADDER SUSPENSION    . BREAST CYST EXCISION Bilateral   . BREAST SURGERY Left    "nipple taken off"  . CARDIAC CATHETERIZATION    . CARPAL TUNNEL RELEASE Bilateral 2010's  . CATARACT EXTRACTION W/ INTRAOCULAR LENS IMPLANT Left 2014  . CHOLECYSTECTOMY OPEN  1980's  . DILATION AND CURETTAGE OF UTERUS  "several"  . HERNIA REPAIR    . JOINT REPLACEMENT    . LEFT HEART CATH AND CORONARY ANGIOGRAPHY N/A 10/23/2017   Procedure: LEFT HEART CATH AND CORONARY ANGIOGRAPHY;  Surgeon: Troy Sine, MD;  Location: [facility];  Service: Cardiovascular;  Laterality: N/A;  . LUMBAR LAMINECTOMY/DECOMPRESSION MICRODISCECTOMY Bilateral 06/09/2018   Procedure: Laminectomy and Foraminotomy - Lumbar Two,Lumbar Three - Lumbar One- Two - Lumbar Three-Four- bilateral;  Surgeon: Kary Kos, MD;  Location: [facility];  Service: Neurosurgery;  Laterality: Bilateral;  . PATELLA FRACTURE SURGERY Left "after 1992"   "after the replacement"  . REDUCTION MAMMAPLASTY  Bilateral   . REPLACEMENT TOTAL KNEE Bilateral    "left in 1992; right in 2001"  . VAGINAL HYSTERECTOMY  1972   Family History  Problem Relation Age of Onset  . Hypertension Mother   . Stroke Mother   . Diabetes Mother   . Heart attack Father   . Breast cancer Sister   . Leukemia Brother   . Breast cancer Sister   . Breast cancer Sister   . Heart disease Brother    Social History:  reports that she  has never smoked. She has never used smokeless tobacco. She reports that she does not drink alcohol or use drugs. Allergies:  Allergies  Allergen Reactions  . Contrast Media [Iodinated Diagnostic Agents] Swelling and Rash    Face, tongue  . Penicillins Rash and Other (See Comments)    PATIENT HAS HAD A PCN REACTION WITH IMMEDIATE RASH, FACIAL/TONGUE/THROAT SWELLING, SOB, OR LIGHTHEADEDNESS WITH HYPOTENSION:  #  #  YES  #  #  Has patient had a PCN reaction causing severe rash involving mucus membranes or skin necrosis: NO Has patient had a PCN reaction that required hospitalization NO Has patient had a PCN reaction occurring within the last 10 years: NO If all of the above answers are "NO", then may proceed with Cephalosporin use.  . Baclofen Itching  . Latex Rash  . Other Dermatitis    Bandages caused redness    Medications Prior to Admission  Medication Sig Dispense Refill  . acetaminophen (TYLENOL) 500 MG tablet Take 1,000 mg by mouth 2 (two) times daily as needed for moderate pain.    Marland Kitchen ALPRAZolam (XANAX) 0.25 MG tablet Take 1 tablet (0.25 mg total) by mouth daily as needed. (Patient taking differently: Take 0.25 mg by mouth daily as needed for anxiety. ) 30 tablet 0  . celecoxib (CELEBREX) 200 MG capsule Take 200 mg by mouth daily.     . cetirizine (ZYRTEC) 10 MG tablet Take 10 mg by mouth every evening.   5  . fluticasone (FLONASE) 50 MCG/ACT nasal spray Place 1 spray into both nostrils daily as needed for allergies.     . furosemide (LASIX) 40 MG tablet Take 40 mg by mouth daily.    Marland Kitchen gabapentin (NEURONTIN) 300 MG capsule Take 300 mg by mouth 2 (two) times daily.     Marland Kitchen HYDROcodone-acetaminophen (NORCO/VICODIN) 5-325 MG tablet 1 po q 6 hrs prn pain (Patient taking differently: Take 1 tablet by mouth every 6 (six) hours as needed (for pain.). ) 30 tablet 0  . pantoprazole (PROTONIX) 20 MG tablet Take 1 tablet (20 mg total) by mouth daily. (Patient taking differently: Take 20 mg by  mouth at bedtime. ) 30 tablet 0  . ramipril (ALTACE) 2.5 MG capsule Take 2.5 mg by mouth daily.  5  . ranitidine (ZANTAC) 300 MG tablet Take 300 mg by mouth daily before breakfast.   1  . traMADol (ULTRAM) 50 MG tablet Take 100 mg by mouth every 6 (six) hours as needed for severe pain.   0  . Vitamin D, Ergocalciferol, (DRISDOL) 50000 UNITS CAPS capsule Take 50,000 Units by mouth every Thursday.   5    Home: Home Living Family/patient expects to be discharged to:: Private residence Living Arrangements: Spouse/significant other Available Help at Discharge: Family, Available 24 hours/day Type of Home: House Home Access: Stairs to enter [employer] of Steps: 1 Home Layout: One level Bathroom Shower/Tub: Gideon unit, Architectural technologist: Standard Home Equipment: Environmental consultant - 2  wheels, Tub bench  Functional History: Prior Function Level of Independence: Needs assistance Gait / Transfers Assistance Needed: Used cane and then RW prior to surgery ADL's / Homemaking Assistance Needed: Increased time and effort for dressing. Sponge bathed at sink. Simple IADLs.  Comments: Husband sick and with decreased mobility. Not able to provide assistance Functional Status:  Mobility: Bed Mobility Overal bed mobility: Needs Assistance Bed Mobility: Rolling, Sidelying to Sit Rolling: Mod assist Sidelying to sit: Min assist General bed mobility comments: Pt required mod A for rolling as the bed pad was used to facilitate roll. Multimodal cues and min A required to complete log roll from sidelying<>sit, with instructions for hand placement for pushing off of bed and bringing legs off of bed. Also needed VC for back precautions. Transfers Overall transfer level: Needs assistance Equipment used: Rolling walker (2 wheeled) Transfers: Sit to/from Stand Sit to Stand: Min assist General transfer comment: Min A for hand placement to push from bed instead of pulling up from RW and powering up to  stand.  Ambulation/Gait Ambulation/Gait assistance: Min assist Gait Distance (Feet): 100 Feet Assistive device: Rolling walker (2 wheeled) Gait Pattern/deviations: Step-to pattern, Step-through pattern, Decreased stride length, Shuffle General Gait Details: Pt required verbal cues to increase step length while ambulating and to remain in RW. Pt required increased time with turns as she reported mild dizziness (which she has at baseline). Pt also got SOB at the end of ambulation requring VC for breathing.  Gait velocity: decreased Gait velocity interpretation: <1.8 ft/sec, indicate of risk for recurrent falls    ADL: ADL Overall ADL's : Needs assistance/impaired Eating/Feeding: Set up, Sitting Grooming: Wash/dry hands, Min guard, Standing Grooming Details (indicate cue type and reason): Min Guard for safety standing at sink Upper Body Bathing: Minimal assistance, Sitting Lower Body Bathing: Maximal assistance, Sit to/from stand Upper Body Dressing : Minimal assistance, Sitting Lower Body Dressing: Maximal assistance, Sit to/from stand Toilet Transfer: Moderate assistance, RW, Regular Toilet, Ambulation, Grab bars Toilet Transfer Details (indicate cue type and reason): Mod A to power up from toilet seat.  Toileting- Clothing Manipulation and Hygiene: Minimal assistance, Sit to/from stand Toileting - Clothing Manipulation Details (indicate cue type and reason): Min A for standing balance during toilet hygiene Functional mobility during ADLs: Min guard, Rolling walker(Significant amount of time) General ADL Comments: Pt with decreased balance and requiring significant amount of time throughout session  Cognition: Cognition Overall Cognitive Status: Impaired/Different from baseline Orientation Level: Oriented X4 Cognition Arousal/Alertness: Lethargic, Suspect due to medications Behavior During Therapy: Flat affect Overall Cognitive Status: Impaired/Different from baseline Area of  Impairment: Following commands, Problem solving, Awareness Following Commands: Follows one step commands with increased time Awareness: Emergent Problem Solving: Slow processing, Requires verbal cues General Comments: Pt closing her eyes throughout and moving very slowly. Requiring increased time for problem solving  Blood pressure 110/61, pulse 82, temperature 97.6 F (36.4 C), temperature source Oral, resp. rate 18, height 5' 4.5" (1.638 m), weight 112.9 kg (249 lb), SpO2 99 %. Physical Exam  Vitals reviewed. Constitutional: She is oriented to person, place, and time. She appears well-developed.  77 year old right-handed obese female  HENT:  Head: Normocephalic and atraumatic.  Eyes: EOM are normal. Right eye exhibits no discharge. Left eye exhibits no discharge.  Neck: Normal range of motion. Neck supple. No thyromegaly present.  Cardiovascular: Normal rate, regular rhythm and normal heart sounds.  Respiratory: Effort normal and breath sounds normal. No respiratory distress.  GI: Soft. Bowel sounds are normal.  She exhibits no distension.  Musculoskeletal:  LE edema  Neurological: She is alert and oriented to person, place, and time.  Motor: RUE: 4-4+/5 proximal to distal LUE: 5/5 proximal to distal B/l LE: 4+/5 proximal to distal Sensation intact to light touch  Skin: Skin is warm and dry.  Back incision is dressed B/l knee incisions healed  Psychiatric: She has a normal mood and affect. Her behavior is normal.   No results found for this or any previous visit (from the past 24 hour(s)). Dg Lumbar Spine 2-3 Views  Result Date: 06/09/2018 CLINICAL DATA:  Bilateral L2 through L4 laminectomy and foraminotomy. EXAM: LUMBAR SPINE - 2-3 VIEW COMPARISON:  MRI of February 01, 2018. FINDINGS: Two intraoperative cross-table lateral projections were obtained of the lumbar spine. The first submitted image does not demonstrate any definite localization. The second image demonstrates surgical  probe directed toward the posterior elements of L2. IMPRESSION: Surgical localization as described above. Electronically Signed   By: Marijo Conception, M.D.   On: 06/09/2018 13:07    Assessment/Plan: Diagnosis: Lumbar radiculopathy s/p decompression Labs independently reviewed.  Records reviewed and summated above.  1. Does the need for close, 24 hr/day medical supervision in concert with the patient's rehab needs make it unreasonable for this patient to be served in a less intensive setting? Yes  2. Co-Morbidities requiring supervision/potential complications: HTN (monitor and provide prns in accordance with increased physical exertion and pain), bilateral TKA, acute on chronic low back pain (Biofeedback training with therapies to help reduce reliance on opiate pain medications, monitor pain control during therapies, and sedation at rest and titrate to maximum efficacy to ensure participation and gains in therapies), ABLA (transfuse if necessary to ensure appropriate perfusion for increased activity tolerance), morbid obesity (encouage weight loss) 3. Due to safety, skin/wound care, disease management, pain management and patient education, does the patient require 24 hr/day rehab nursing? Yes 4. Does the patient require coordinated care of a physician, rehab nurse, PT (1-2 hrs/day, 5 days/week) and OT (1-2 hrs/day, 5 days/week) to address physical and functional deficits in the context of the above medical diagnosis(es)? Yes Addressing deficits in the following areas: balance, endurance, locomotion, strength, transferring, bathing, dressing, toileting and psychosocial support 5. Can the patient actively participate in an intensive therapy program of at least 3 hrs of therapy per day at least 5 days per week? Potentially 6. The potential for patient to make measurable gains while on inpatient rehab is good 7. Anticipated functional outcomes upon discharge from inpatient rehab are supervision  with PT,  supervision with OT, n/a with SLP. 8. Estimated rehab length of stay to reach the above functional goals is: 14-17 days. 9. Anticipated D/C setting: Home 10. Anticipated post D/C treatments: HH therapy and Home excercise program 11. Overall Rehab/Functional Prognosis: excellent and good  RECOMMENDATIONS: This patient's condition is appropriate for continued rehabilitative care in the following setting: CIR when patient able to tolerate 3 hours of therapy/day Patient has agreed to participate in recommended program. Yes Note that insurance prior authorization may be required for reimbursement for recommended care.  Comment: Rehab Admissions Coordinator to follow up.   I have personally performed a face to face diagnostic evaluation, including, but not limited to relevant history and physical exam findings, of this patient and developed relevant assessment and plan.  Additionally, I have reviewed and concur with the physician assistant's documentation above.   Delice Lesch, MD, ABPMR Lavon Paganini Angiulli, PA-C 06/11/2018

## 2018-06-11 NOTE — Progress Notes (Signed)
Physical Therapy Treatment Patient Details Name: Wendy Carrillo MRN: [id number] DOB: 11/21/1940 Today's Date: 06/11/2018    History of Present Illness  77 yo female s/p lumbar decompression L1-L4. PMH including arthritis, GERD, HTN, hepatitis, and Bil TKA.     PT Comments    Patient is making progress toward PT goals. Pt's husband and son present during session and supportive. Current plan remains appropriate.    Follow Up Recommendations  CIR;Supervision/Assistance - 24 hour     Equipment Recommendations  Rolling walker with 5" wheels    Recommendations for Other Services Rehab consult     Precautions / Restrictions Precautions Precautions: Fall;Back Precaution Comments: Pt able to recall 2/3 precautions; 3/3 precautions reviewed Other Brace/Splint: No brace per MD order Restrictions Weight Bearing Restrictions: No    Mobility  Bed Mobility Overal bed mobility: Needs Assistance Bed Mobility: Sit to Sidelying;Rolling Rolling: Min guard       Sit to sidelying: Mod assist General bed mobility comments: cues for sequencing and assistance required to bring bilat LE into bed; +2 assistance required for positioning once in supine  Transfers Overall transfer level: Needs assistance Equipment used: Rolling walker (2 wheeled) Transfers: Sit to/from Stand Sit to Stand: Min guard         General transfer comment: min guard for safety; cues for safe hand placement  Ambulation/Gait Ambulation/Gait assistance: Min guard Gait Distance (Feet): 150 Feet Assistive device: Rolling walker (2 wheeled) Gait Pattern/deviations: Step-through pattern;Decreased step length - right;Decreased step length - left;Decreased stride length Gait velocity: decreased   General Gait Details: slow, steady gait   Stairs             Wheelchair Mobility    Modified Rankin (Stroke Patients Only)       Balance Overall balance assessment: Needs assistance Sitting-balance support:  No upper extremity supported;Feet supported Sitting balance-Leahy Scale: Fair     Standing balance support: During functional activity;No upper extremity supported Standing balance-Leahy Scale: Poor Standing balance comment: dependent on RW for dynamic balance                            Cognition Arousal/Alertness: Awake/alert Behavior During Therapy: WFL for tasks assessed/performed Overall Cognitive Status: Within Functional Limits for tasks assessed                                        Exercises      General Comments General comments (skin integrity, edema, etc.): husband and son present      Pertinent Vitals/Pain Pain Assessment: Faces Faces Pain Scale: Hurts a little bit Pain Location: Back and R LE Pain Descriptors / Indicators: Sore;Guarding Pain Intervention(s): Limited activity within patient's tolerance;Repositioned;Monitored during session    Home Living                      Prior Function            PT Goals (current goals can now be found in the care plan section) Acute Rehab PT Goals Patient Stated Goal: to get back to independent PT Goal Formulation: With patient/family Time For Goal Achievement: 06/24/18 Potential to Achieve Goals: Good Progress towards PT goals: Progressing toward goals    Frequency    Min 5X/week      PT Plan Current plan remains appropriate    Co-evaluation  AM-PAC PT "6 Clicks" Daily Activity  Outcome Measure  Difficulty turning over in bed (including adjusting bedclothes, sheets and blankets)?: Unable Difficulty moving from lying on back to sitting on the side of the bed? : A Lot Difficulty sitting down on and standing up from a chair with arms (e.g., wheelchair, bedside commode, etc,.)?: A Lot Help needed moving to and from a bed to chair (including a wheelchair)?: A Little Help needed walking in hospital room?: A Little Help needed climbing 3-5 steps with a  railing? : A Lot 6 Click Score: 13    End of Session Equipment Utilized During Treatment: Gait belt Activity Tolerance: Patient tolerated treatment well Patient left: with call bell/phone within reach;with family/visitor present;in bed;with SCD's reapplied Nurse Communication: Mobility status PT Visit Diagnosis: Unsteadiness on feet (R26.81);Pain;Muscle weakness (generalized) (M62.81) Pain - part of body: (back)     Time: 8979-1504 PT Time Calculation (min) (ACUTE ONLY): 24 min  Charges:  $Gait Training: 8-22 mins $Therapeutic Activity: 8-22 mins                     Earney Navy, PTA Pager: [phone]     Darliss Cheney 06/11/2018, 4:54 PM

## 2018-06-12 NOTE — Progress Notes (Signed)
Physical Therapy Treatment Patient Details Name: AILEE PATES MRN: [id number] DOB: 1941-07-15 Today's Date: 06/12/2018    History of Present Illness  77 yo female s/p lumbar decompression L1-L4. PMH including arthritis, GERD, HTN, hepatitis, and Bil TKA.     PT Comments    Patient is making progress toward PT goals and able to recall 3/3 precautions. Pt continues to have the most difficulty with bed mobility and requires mod A to assist with bilat LE in/out bed. Pt needs to practice stairs next session.    Follow Up Recommendations  CIR;Supervision/Assistance - 24 hour     Equipment Recommendations  Rolling walker with 5" wheels    Recommendations for Other Services Rehab consult     Precautions / Restrictions Precautions Precautions: Fall;Back Precaution Comments: Pt able to recall 3/3 precautions Other Brace/Splint: No brace per MD order Restrictions Weight Bearing Restrictions: No    Mobility  Bed Mobility Overal bed mobility: Needs Assistance Bed Mobility: Sit to Sidelying;Rolling Rolling: Min guard       Sit to sidelying: Mod assist General bed mobility comments: cues for sequencing and assistance to bring bilat LE into bed  Transfers Overall transfer level: Needs assistance Equipment used: Rolling walker (2 wheeled) Transfers: Sit to/from Stand Sit to Stand: Min guard         General transfer comment: cues for safe hand placement  Ambulation/Gait Ambulation/Gait assistance: Min guard Gait Distance (Feet): 250 Feet Assistive device: Rolling walker (2 wheeled) Gait Pattern/deviations: Step-through pattern;Decreased stride length Gait velocity: decreased   General Gait Details: steady gait and safe use of AD   Stairs             Wheelchair Mobility    Modified Rankin (Stroke Patients Only)       Balance Overall balance assessment: Needs assistance Sitting-balance support: No upper extremity supported;Feet supported Sitting  balance-Leahy Scale: Fair     Standing balance support: During functional activity;Bilateral upper extremity supported Standing balance-Leahy Scale: Poor Standing balance comment: pt able to static stand without UE support                            Cognition Arousal/Alertness: Awake/alert Behavior During Therapy: WFL for tasks assessed/performed Overall Cognitive Status: Within Functional Limits for tasks assessed                                        Exercises      General Comments General comments (skin integrity, edema, etc.): family present      Pertinent Vitals/Pain Pain Assessment: 0-10 Pain Score: 4  Pain Location: back Pain Intervention(s): Limited activity within patient's tolerance;Repositioned    Home Living                      Prior Function            PT Goals (current goals can now be found in the care plan section) Acute Rehab PT Goals PT Goal Formulation: With patient/family Time For Goal Achievement: 06/24/18 Potential to Achieve Goals: Good Progress towards PT goals: Progressing toward goals    Frequency    Min 5X/week      PT Plan Current plan remains appropriate    Co-evaluation              AM-PAC PT "6 Clicks" Daily Activity  Outcome Measure  Difficulty turning over in bed (including adjusting bedclothes, sheets and blankets)?: A Lot Difficulty moving from lying on back to sitting on the side of the bed? : A Lot Difficulty sitting down on and standing up from a chair with arms (e.g., wheelchair, bedside commode, etc,.)?: A Lot Help needed moving to and from a bed to chair (including a wheelchair)?: A Little Help needed walking in hospital room?: A Little Help needed climbing 3-5 steps with a railing? : A Lot 6 Click Score: 14    End of Session Equipment Utilized During Treatment: Gait belt Activity Tolerance: Patient tolerated treatment well Patient left: with call bell/phone within  reach;with family/visitor present;in bed;with SCD's reapplied Nurse Communication: Mobility status PT Visit Diagnosis: Unsteadiness on feet (R26.81);Pain;Muscle weakness (generalized) (M62.81) Pain - part of body: (back)     Time: 1449-1510 PT Time Calculation (min) (ACUTE ONLY): 21 min  Charges:  $Gait Training: 8-22 mins                     Earney Navy, PTA Pager: [phone]     Darliss Cheney 06/12/2018, 3:42 PM

## 2018-06-12 NOTE — Progress Notes (Signed)
Honeycomb dressing was bloody and still draining. Nurse changed dressing

## 2018-06-12 NOTE — Progress Notes (Signed)
NEUROSURGERY PROGRESS NOTE  Doing well. Complains of appropriate back soreness. No leg pain  Temp:  [98.2 F (36.8 C)-98.6 F (37 C)] 98.2 F (36.8 C) (08/08 0904) Pulse Rate:  [75-81] 81 (08/08 0904) Resp:  [17-18] 18 (08/08 0904) BP: (96-120)/(48-76) 120/50 (08/08 0904) SpO2:  [90 %-100 %] 100 % (08/08 0904)  Plan: Doing well, waiting CIR consult. Will plan to d/c rehab  Eleonore Chiquito, NP 06/12/2018 11:36 AM

## 2018-06-12 NOTE — Care Management Important Message (Signed)
Important Message  Patient Details  Name: Wendy Carrillo MRN: [id number] Date of Birth: 12-29-1940   Medicare Important Message Given:  Yes    Langley Flatley 06/12/2018, 1:42 PM

## 2018-06-13 MED ORDER — BISACODYL 10 MG RE SUPP
10.0000 mg | Freq: Every day | RECTAL | Status: DC | PRN
  Administered 2018-06-14: 10 mg via RECTAL
  Filled 2018-06-13: qty 1

## 2018-06-13 MED ORDER — POLYETHYLENE GLYCOL 3350 17 G PO PACK
17.0000 g | PACK | Freq: Every day | ORAL | Status: DC
  Administered 2018-06-13 – 2018-06-17 (×5): 17 g via ORAL
  Filled 2018-06-13 (×5): qty 1

## 2018-06-13 MED ORDER — SENNA 8.6 MG PO TABS
1.0000 | ORAL_TABLET | Freq: Every day | ORAL | Status: DC | PRN
  Administered 2018-06-13: 8.6 mg via ORAL
  Filled 2018-06-13: qty 1

## 2018-06-13 NOTE — Progress Notes (Signed)
Subjective: Patient reports mild itching at op site. Pain is well-controlled. Moderate stiffness in lower back. Difficulty passing stool.  Objective: Vital signs in last 24 hours: Temp:  [98.5 F (36.9 C)-98.8 F (37.1 C)] 98.8 F (37.1 C) (08/09 0818) Pulse Rate:  [71-84] 84 (08/09 0818) Resp:  [18-20] 18 (08/09 0818) BP: (112-119)/(54-65) 117/54 (08/09 0818) SpO2:  [96 %-100 %] 100 % (08/09 0818)  Intake/Output from previous day: 08/08 0701 - 08/09 0700 In: 1120 [P.O.:1120] Out: -  Intake/Output this shift: Total I/O In: 240 [P.O.:240] Out: -   Neurologic: Alert and oriented X 3, normal strength and tone. Normal symmetric reflexes. Normal coordination and gait  GI: Will schedule Miralax daily and Senna and dulcolax suppository daily prn.  Patient is sitting up with nursing staff, receiving bath. Dressing moderately saturated with serousanguinous drainage. No erythema at op site. Hemovac in place with minimal output.  Lab Results: No results for input(s): WBC, HGB, HCT, PLT in the last 72 hours. BMET No results for input(s): NA, K, CL, CO2, GLUCOSE, BUN, CREATININE, CALCIUM in the last 72 hours.  Studies/Results: No results found.  Assessment/Plan: Doing well. Awaiting CIR consult. Plan still to d/c to rehab.  LOS: 4 days  Will advise nursing staff to remove hemovac and change honeycomb prn.   Wendy Carrillo 06/13/2018, 10:23 AM

## 2018-06-13 NOTE — Progress Notes (Signed)
Inpatient Rehabilitation  Received a denial for IP Rehab admission from [facility] Medicare.  Dr. Naaman Plummer notified and reviewed case saw no grounds for an appeal and recommends home with caregiver support or SNF.  Notified patient bedside and daughter via phone.  They requested how to appeal and I have reached out to [facility] Medicare for denial letter and instructions on how family can appeal.  Notified daughter of update per her request and stated that she could also reach out to [facility].  Notified nurse case Freight forwarder and [name].   Carmelia Roller., CCC/SLP Admission Coordinator  [place]  Cell [phone]

## 2018-06-13 NOTE — Progress Notes (Signed)
Patient still  having some bloody drainage from the incision/drain site. No pain, redness, or purulent drainage at incision site.  Nurse discussed this concern with the NP Meghan. Well will change dressing and continue to monitor. Nurse took the Hemovac out Per order.

## 2018-06-13 NOTE — Clinical Social Work Note (Signed)
Clinical Social Work Assessment  Patient Details  Name: Wendy Carrillo MRN: [id number] Date of Birth: 26-Jul-1941  Date of referral:  06/13/18               Reason for consult:  Facility Placement                Permission sought to share information with:  Facility Sport and exercise psychologist, Family Supports Permission granted to share information::  Yes, Verbal Permission Granted  Name::     Rodney Langton  Agency::  SNF  Relationship::  Spouse, Children  Contact Information:     Housing/Transportation Living arrangements for the past 2 months:  Single Family Home Source of Information:  Patient, Medical Team, Adult Children Patient Interpreter Needed:  None Criminal Activity/Legal Involvement Pertinent to Current Situation/Hospitalization:  No - Comment as needed Significant Relationships:  Adult Children, Spouse Lives with:  Self, Spouse Do you feel safe going back to the place where you live?  Yes Need for family participation in patient care:  No (Coment)  Care giving concerns:  Patient from home with spouse, but spouse is unable to physically assist the patient. Patient was hopeful for CIR, but would like to pursue SNF placement as insurance will not pay for CIR.   Social Worker assessment / plan:  [name] met with patient and family. [name] provided emotional support as patient and family were upset about the insurance decision. [name] provided information on SNF placement and provided a SNF list for review. [name] informed patient and family to research list and determine preferences for SNF placement.  Employment status:  Retired Forensic scientist:    PT Recommendations:  Inpatient [place] / Referral to community resources:  [place]  Patient/Family's Response to care:  Patient and family were hopeful for CIR admission, and are upset that insurance has declined. Patient and family will review SNF options for placement.  Patient/Family's  Understanding of and Emotional Response to Diagnosis, Current Treatment, and Prognosis:  Patient and family were not understanding that the patient's current status is doing really well and that she doesn't medically qualify for CIR placement; patient and family had a lot of questions about why the insurance wouldn't pay for what they think that she needs. Patient and family discussed whether or not the patient should go home versus go to SNF, and patient's children are concerned about her being at home because the patient's husband is unable to provide much physical assist as he has his own mobility issues.   Emotional Assessment Appearance:  Appears stated age Attitude/Demeanor/Rapport:  Angry, Engaged Affect (typically observed):  Frustrated, Angry Orientation:  Oriented to Self, Oriented to Place, Oriented to  Time, Oriented to Situation Alcohol / Substance use:  Not Applicable Psych involvement (Current and /or in the community):  No (Comment)  Discharge Needs  Concerns to be addressed:  Care Coordination Readmission within the last 30 days:  No Current discharge risk:  Dependent with Mobility, Physical Impairment Barriers to Discharge:  Continued Medical Work up, [place], [place] 06/13/2018, 4:46 PM

## 2018-06-13 NOTE — Progress Notes (Signed)
PT Cancellation Note  Patient Details Name: Wendy Carrillo MRN: [id number] DOB: Aug 26, 1941   Cancelled Treatment:     Patient declined mobilizing right now and reports recently lying back down after bathing. PT will continue to follow acutely.    [place] April, PTA Pager: [phone]   06/13/2018, 11:09 AM

## 2018-06-14 MED ORDER — HEPARIN SODIUM (PORCINE) 5000 UNIT/ML IJ SOLN
5000.0000 [IU] | Freq: Three times a day (TID) | INTRAMUSCULAR | Status: DC
  Administered 2018-06-14 – 2018-06-17 (×10): 5000 [IU] via SUBCUTANEOUS
  Filled 2018-06-14 (×10): qty 1

## 2018-06-14 NOTE — Progress Notes (Signed)
Neurosurgery Service Progress Note  Subjective: No acute events overnight. Preop leg pain resolved after surgery, continues to have no leg pain, some incisional pain in the back, otherwise no complaints. Pt and son are frustrated about denial to acute rehab.   Objective: Vitals:   06/13/18 1947 06/13/18 2330 06/14/18 0342 06/14/18 0750  BP: 116/71 (!) 141/55 (!) 144/64 (!) 155/86  Pulse: 73 75 74 74  Resp: 18 18 18    Temp: 98.2 F (36.8 C) 98.7 F (37.1 C) 98.6 F (37 C) 98 F (36.7 C)  TempSrc: Oral Oral Oral Oral  SpO2: 100% 100% 100% 96%  Weight:      Height:       Temp (24hrs), Avg:98.4 F (36.9 C), Min:98 F (36.7 C), Max:98.7 F (37.1 C)  Intake/Output Summary (Last 24 hours) at 06/14/2018 0844 Last data filed at 06/14/2018 0200 Gross per 24 hour  Intake 410 ml  Output 400 ml  Net 10 ml    Current Facility-Administered Medications:  .  0.9 %  sodium chloride infusion, 250 mL, Intravenous, Continuous, Kary Kos, MD, Last Rate: 1 mL/hr at 06/09/18 1742 .  acetaminophen (TYLENOL) tablet 650 mg, 650 mg, Oral, Q4H PRN, 650 mg at 06/13/18 2106 **OR** acetaminophen (TYLENOL) suppository 650 mg, 650 mg, Rectal, Q4H PRN, Kary Kos, MD .  ALPRAZolam Duanne Moron) tablet 0.25 mg, 0.25 mg, Oral, Daily PRN, Kary Kos, MD, 0.25 mg at 06/13/18 2308 .  alum & mag hydroxide-simeth (MAALOX/MYLANTA) 200-200-20 MG/5ML suspension 30 mL, 30 mL, Oral, Q6H PRN, Kary Kos, MD, 30 mL at 06/12/18 1604 .  bisacodyl (DULCOLAX) suppository 10 mg, 10 mg, Rectal, Daily PRN, Viona Gilmore D, NP, 10 mg at 06/14/18 0239 .  celecoxib (CELEBREX) capsule 200 mg, 200 mg, Oral, Daily, Kary Kos, MD, 200 mg at 06/13/18 1043 .  cyclobenzaprine (FLEXERIL) tablet 10 mg, 10 mg, Oral, TID PRN, Kary Kos, MD, 10 mg at 06/13/18 2105 .  fluticasone (FLONASE) 50 MCG/ACT nasal spray 1 spray, 1 spray, Each Nare, Daily PRN, Kary Kos, MD .  furosemide (LASIX) tablet 40 mg, 40 mg, Oral, Daily, Kary Kos, MD, 40 mg at  06/13/18 1043 .  gabapentin (NEURONTIN) capsule 300 mg, 300 mg, Oral, BID, Kary Kos, MD, 300 mg at 06/13/18 2105 .  HYDROcodone-acetaminophen (NORCO/VICODIN) 5-325 MG per tablet 1 tablet, 1 tablet, Oral, Q6H PRN, Kary Kos, MD, 1 tablet at 06/11/18 0547 .  HYDROmorphone (DILAUDID) injection 0.5 mg, 0.5 mg, Intravenous, Q2H PRN, Kary Kos, MD .  lactated ringers infusion, , Intravenous, Continuous, Myrtie Soman, MD, Last Rate: 10 mL/hr at 06/09/18 0713 .  menthol-cetylpyridinium (CEPACOL) lozenge 3 mg, 1 lozenge, Oral, PRN, 3 mg at 06/10/18 1234 **OR** phenol (CHLORASEPTIC) mouth spray 1 spray, 1 spray, Mouth/Throat, PRN, Kary Kos, MD .  ondansetron (ZOFRAN) tablet 4 mg, 4 mg, Oral, Q6H PRN **OR** ondansetron (ZOFRAN) injection 4 mg, 4 mg, Intravenous, Q6H PRN, Kary Kos, MD .  oxyCODONE (Oxy IR/ROXICODONE) immediate release tablet 10 mg, 10 mg, Oral, Q3H PRN, Kary Kos, MD, 10 mg at 06/14/18 0646 .  pantoprazole (PROTONIX) EC tablet 20 mg, 20 mg, Oral, QHS, Kary Kos, MD, 20 mg at 06/13/18 2108 .  polyethylene glycol (MIRALAX / GLYCOLAX) packet 17 g, 17 g, Oral, Daily, Bergman, Meghan D, NP, 17 g at 06/13/18 1718 .  ramipril (ALTACE) capsule 2.5 mg, 2.5 mg, Oral, Daily, Kary Kos, MD, 2.5 mg at 06/13/18 1217 .  senna (SENOKOT) tablet 8.6 mg, 1 tablet, Oral, Daily PRN, Reinaldo Meeker, Meghan D,  NP, 8.6 mg at 06/13/18 1218 .  sodium chloride flush (NS) 0.9 % injection 3 mL, 3 mL, Intravenous, Q12H, Kary Kos, MD, 3 mL at 06/13/18 2211 .  sodium chloride flush (NS) 0.9 % injection 3 mL, 3 mL, Intravenous, PRN, Kary Kos, MD .  Vitamin D (Ergocalciferol) (DRISDOL) capsule 50,000 Units, 50,000 Units, Oral, Nestor Ramp, MD, 50,000 Units at 06/12/18 1137   Physical Exam: AOx3, PERRL, EOMI, FS, TM, Strength 5/5 x4, SILTx4, no drift  Assessment & Plan: 77 y.o.yo female s/p L1-L4 decompression, recovering well. -denied for rehab, family pursuing appeal; likely home versus SNF -activity and  diet as tolerated -[place]  06/12/18 5:08 PM

## 2018-06-14 NOTE — Progress Notes (Signed)
Physical Therapy Treatment Patient Details Name: Wendy Carrillo MRN: [id number] DOB: 09/01/1941 Today's Date: 06/14/2018    History of Present Illness  77 yo female s/p lumbar decompression L1-L4. PMH including arthritis, GERD, HTN, hepatitis, and Bil TKA.     PT Comments    Pt reports not feeling well and is upset about news that CIR was denied by insurance. Does not want to discuss any other options at this time. Pt ambulating hallway with min guard, limited activity tolerance, short steps, requests to return to room earlier than last PT session, I believe more limited by stress than physica today. Cited 3/3 back precautions, could benefit from further log roll training. Believe she could tolerate CIR level therapies at this point as she has progressing well from initial PT eval.      Follow Up Recommendations  CIR;Supervision/Assistance - 24 hour     Equipment Recommendations  Rolling walker with 5" wheels    Recommendations for Other Services Rehab consult     Precautions / Restrictions Precautions Precautions: Fall;Back Precaution Booklet Issued: Yes (comment) Precaution Comments: Pt able to recall 3/3 precautions Restrictions Weight Bearing Restrictions: No    Mobility  Bed Mobility Overal bed mobility: Modified Independent                Transfers Overall transfer level: Needs assistance Equipment used: Rolling walker (2 wheeled) Transfers: Sit to/from Stand Sit to Stand: Min guard         General transfer comment: cues for safe hand placement  Ambulation/Gait Ambulation/Gait assistance: Min guard Gait Distance (Feet): 130 Feet Assistive device: Rolling walker (2 wheeled) Gait Pattern/deviations: Step-through pattern;Decreased stride length Gait velocity: decreased   General Gait Details: pt with short step length, slow and steady. reports not feeling well today and requests to turn back to room early   Stairs             Wheelchair  Mobility    Modified Rankin (Stroke Patients Only)       Balance Overall balance assessment: Needs assistance Sitting-balance support: No upper extremity supported;Feet supported Sitting balance-Leahy Scale: Fair     Standing balance support: During functional activity;Bilateral upper extremity supported Standing balance-Leahy Scale: Poor Standing balance comment: pt able to static stand without UE support                            Cognition Arousal/Alertness: Awake/alert Behavior During Therapy: WFL for tasks assessed/performed Overall Cognitive Status: Within Functional Limits for tasks assessed                                        Exercises      General Comments        Pertinent Vitals/Pain Pain Assessment: Faces Faces Pain Scale: Hurts little more Pain Location: back Pain Descriptors / Indicators: Sore;Guarding Pain Intervention(s): Limited activity within patient's tolerance;Monitored during session;Premedicated before session    Home Living                      Prior Function            PT Goals (current goals can now be found in the care plan section) Acute Rehab PT Goals PT Goal Formulation: With patient/family Time For Goal Achievement: 06/24/18 Potential to Achieve Goals: Good Progress towards PT goals: Progressing toward goals  Frequency    Min 5X/week      PT Plan Current plan remains appropriate    Co-evaluation              AM-PAC PT "6 Clicks" Daily Activity  Outcome Measure  Difficulty turning over in bed (including adjusting bedclothes, sheets and blankets)?: A Little Difficulty moving from lying on back to sitting on the side of the bed? : A Little Difficulty sitting down on and standing up from a chair with arms (e.g., wheelchair, bedside commode, etc,.)?: A Little Help needed moving to and from a bed to chair (including a wheelchair)?: A Little Help needed walking in hospital  room?: A Little Help needed climbing 3-5 steps with a railing? : A Lot 6 Click Score: 17    End of Session Equipment Utilized During Treatment: Gait belt Activity Tolerance: Patient tolerated treatment well Patient left: with call bell/phone within reach;with family/visitor present;in bed;with SCD's reapplied Nurse Communication: Mobility status PT Visit Diagnosis: Unsteadiness on feet (R26.81);Pain;Muscle weakness (generalized) (M62.81)     Time: 1345-1410 PT Time Calculation (min) (ACUTE ONLY): 25 min  Charges:  $Gait Training: 8-22 mins                     Reinaldo Berber, PT, DPT Acute Rehab Services Pager: [phone]     Reinaldo Berber 06/14/2018, 2:15 PM

## 2018-06-14 NOTE — Plan of Care (Signed)
Ms. Hainer is progressing well.  Her pain is controlled by PRN medications, she is ambulating with a walker and has been calm, cooperative and appropriate.  Family has been at bedside most of the day.  Nursing POC:  fall prevention, pain control, assess wounds and neuro/vascular functions.

## 2018-06-15 NOTE — Progress Notes (Signed)
Neurosurgery Progress Note  No issues overnight.  Endorses back soresness.  Preop LE pain resolved. Tolerating po Working with therapy  No concerns this am   EXAM:  BP 136/74 (BP Location: Right Arm)   Pulse 79   Temp 98.2 F (36.8 C)   Resp 18   Ht 5' 4.5" (1.638 m)   Wt 112.9 kg   SpO2 95%   BMI 42.08 kg/m   Awake, alert, oriented  Speech fluent, appropriate  CN grossly intact  5/5 BUE/BLE  Incision: small amount of dried blood, no active drainage.   PLAN Stable this am Continue to work with therapy Family pursing appeal for denial to CIR. Dispo planning.

## 2018-06-16 NOTE — Progress Notes (Signed)
Physical Therapy Treatment Patient Details Name: Wendy Carrillo MRN: [id number] DOB: [birth year] Today's Date: 06/16/2018    History of Present Illness  77 yo female s/p lumbar decompression L1-L4. PMH including arthritis, GERD, HTN, hepatitis, and Bil TKA.     PT Comments    Patient is making progress toward PT goals. Pt requires min guard assist for supine to sit and gait/stair training this session. Family present throughout session. Continue to progress as tolerated.    Follow Up Recommendations  Home health PT;Supervision for mobility/OOB     Equipment Recommendations  Rolling walker with 5" wheels    Recommendations for Other Services       Precautions / Restrictions Precautions Precautions: Fall;Back Precaution Comments: Pt able to recall 3/3 precautions Required Braces or Orthoses: Other Brace/Splint Other Brace/Splint: No brace per MD order    Mobility  Bed Mobility Overal bed mobility: Needs Assistance Bed Mobility: Rolling;Sidelying to Sit Rolling: Min guard Sidelying to sit: Min guard       General bed mobility comments: min guard for safety; use of rail and increased time and effort  Transfers Overall transfer level: Needs assistance Equipment used: Rolling walker (2 wheeled) Transfers: Sit to/from Stand Sit to Stand: Min guard         General transfer comment: min guard for safety  Ambulation/Gait Ambulation/Gait assistance: Min guard Gait Distance (Feet): (271ft total with seated rest break) Assistive device: Rolling walker (2 wheeled) Gait Pattern/deviations: Step-through pattern;Decreased stride length Gait velocity: decreased   General Gait Details: cues for increased stride length   Stairs Stairs: Yes Stairs assistance: Min guard Stair Management: Two rails;Step to pattern;Forwards Number of Stairs: 2 General stair comments: min guard for safety   Wheelchair Mobility    Modified Rankin (Stroke Patients Only)        Balance Overall balance assessment: Needs assistance Sitting-balance support: No upper extremity supported;Feet supported Sitting balance-Leahy Scale: Fair     Standing balance support: During functional activity;Bilateral upper extremity supported Standing balance-Leahy Scale: Poor                              Cognition Arousal/Alertness: Awake/alert(pt is a little drowsy this session) Behavior During Therapy: WFL for tasks assessed/performed Overall Cognitive Status: Within Functional Limits for tasks assessed                                        Exercises      General Comments        Pertinent Vitals/Pain Pain Assessment: Faces Faces Pain Scale: Hurts little more Pain Location: back especially on L side Pain Descriptors / Indicators: Sore;Guarding Pain Intervention(s): Limited activity within patient's tolerance;Monitored during session;Repositioned;Premedicated before session    Home Living                      Prior Function            PT Goals (current goals can now be found in the care plan section) Acute Rehab PT Goals PT Goal Formulation: With patient/family Time For Goal Achievement: 06/24/18 Potential to Achieve Goals: Good Progress towards PT goals: Progressing toward goals    Frequency    Min 5X/week      PT Plan Discharge plan needs to be updated    Co-evaluation  AM-PAC PT "6 Clicks" Daily Activity  Outcome Measure  Difficulty turning over in bed (including adjusting bedclothes, sheets and blankets)?: A Little Difficulty moving from lying on back to sitting on the side of the bed? : A Little Difficulty sitting down on and standing up from a chair with arms (e.g., wheelchair, bedside commode, etc,.)?: A Little Help needed moving to and from a bed to chair (including a wheelchair)?: A Little Help needed walking in hospital room?: A Little Help needed climbing 3-5 steps with a  railing? : A Little 6 Click Score: 18    End of Session Equipment Utilized During Treatment: Gait belt Activity Tolerance: Patient tolerated treatment well Patient left: with call bell/phone within reach;with family/visitor present;in chair Nurse Communication: Mobility status PT Visit Diagnosis: Unsteadiness on feet (R26.81);Pain;Muscle weakness (generalized) (M62.81) Pain - part of body: (back)     Time: 1336-1400 PT Time Calculation (min) (ACUTE ONLY): 24 min  Charges:  $Gait Training: 8-22 mins $Therapeutic Activity: 8-22 mins                     Earney Navy, PTA Pager: [phone]     Darliss Cheney 06/16/2018, 2:48 PM

## 2018-06-16 NOTE — Progress Notes (Signed)
Subjective: Patient reports Doing okay condition of back pain but no leg pain  Objective: Vital signs in last 24 hours: Temp:  [98 F (36.7 C)-98.5 F (36.9 C)] 98.5 F (36.9 C) (08/12 0400) Pulse Rate:  [73-86] 73 (08/11 2050) Resp:  [15-18] 16 (08/12 0400) BP: (93-128)/(49-109) 93/49 (08/12 0400) SpO2:  [95 %-100 %] 97 % (08/12 0400)  Intake/Output from previous day: No intake/output data recorded. Intake/Output this shift: No intake/output data recorded.  Strength 5 out of 5 wound clean dry and intact  Lab Results: No results for input(s): WBC, HGB, HCT, PLT in the last 72 hours. BMET No results for input(s): NA, K, CL, CO2, GLUCOSE, BUN, CREATININE, CALCIUM in the last 72 hours.  Studies/Results: No results found.  Assessment/Plan: Decompressive laminectomy patient does not have adequate help at home to facilitate her ADLs. Patient was denied inpatient rehabilitation working on placement. Patient is stable for transfer for at any point.  LOS: 7 days     Wendy Carrillo P 06/16/2018, 8:16 AM

## 2018-06-16 NOTE — Progress Notes (Signed)
Occupational Therapy Treatment Patient Details Name: Wendy Carrillo MRN: [id number] DOB: 09-Jun-1941 Today's Date: 06/16/2018    History of present illness  77 yo female s/p lumbar decompression L1-L4. PMH including arthritis, GERD, HTN, hepatitis, and Bil TKA.    OT comments  Pt is progressing towards goals. Pt continues to c/o back pain, however premedicated and willing to engage in functional mobility in room.  Pt continues to require assistance with bed mobility, most especially when returning to bed as pt unable to lift BLE into bed without assistance.  Pt ambulated to sink with RW with min guard progressing to close supervision upon return to bed.  Engaged in grooming tasks in standing at sink with min cues to adhere to back precautions.  Pt family member present and assisting in providing cues for safety with bed mobility.  Educated on use of sock aid to don socks, however pt with difficulty due to decreased grip strength.  Provided pt with theraputty and HEP exercises to increase digit and grip strength to improve independence with ADLs.  Pt will continue to benefit from OT acutely to prepare for d/c as listed below.  Follow Up Recommendations  Supervision/Assistance - 24 hour;Home health OT    Equipment Recommendations  3 in 1 bedside commode    Recommendations for Other Services      Precautions / Restrictions Precautions Precautions: Fall;Back Precaution Comments: Pt able to recall 3/3 precautions Other Brace/Splint: No brace per MD order Restrictions Weight Bearing Restrictions: No       Mobility Bed Mobility       Sidelying to sit: Min guard     Sit to sidelying: Mod assist General bed mobility comments: cues for sequencing and assistance to bring bilat LE into bed  Transfers Overall transfer level: Needs assistance Equipment used: Rolling walker (2 wheeled) Transfers: Sit to/from Stand Sit to Stand: Min guard                  ADL either performed  or assessed with clinical judgement   ADL Overall ADL's : Needs assistance/impaired     Grooming: Wash/dry hands;Standing;Wash/dry face;Supervision/safety               Lower Body Dressing: Sit to/from stand;Cueing for back precautions;With adaptive equipment;Maximal assistance Lower Body Dressing Details (indicate cue type and reason): education on use of sock aid when donning socks.  Pt demonstrating decreased grasp impacting ability to don socks on to sock aid.  Requiring assistance for setup and then don Rt sock. Toilet Transfer: Min guard;Ambulation;RW Toilet Transfer Details (indicate cue type and reason): min guard during ambulation with RW and when lowering to sit on toilet Toileting- Clothing Manipulation and Hygiene: Min guard;Sit to/from stand       Functional mobility during ADLs: Min guard;Rolling walker General ADL Comments: Pt will continue to benefit from education on use of AE for LB ADL.  Pt unable to don socks with sock aid this session due to decreased grip strength.                Cognition Arousal/Alertness: Awake/alert Behavior During Therapy: WFL for tasks assessed/performed Overall Cognitive Status: Within Functional Limits for tasks assessed                                                     Pertinent  Vitals/ Pain       Pain Assessment: 0-10 Pain Score: 5  Pain Location: back Pain Descriptors / Indicators: Sore;Guarding Pain Intervention(s): Limited activity within patient's tolerance;Monitored during session;Premedicated before session     Prior Functioning/Environment              Frequency  Min 3X/week        Progress Toward Goals  OT Goals(current goals can now be found in the care plan section)  Progress towards OT goals: Progressing toward goals  Acute Rehab OT Goals Patient Stated Goal: to get back to independent OT Goal Formulation: With patient Time For Goal Achievement: 06/23/18 Potential to  Achieve Goals: Good  Plan Discharge plan needs to be updated;Frequency needs to be updated       AM-PAC PT "6 Clicks" Daily Activity     Outcome Measure   Help from another person eating meals?: None Help from another person taking care of personal grooming?: A Little Help from another person toileting, which includes using toliet, bedpan, or urinal?: A Little Help from another person bathing (including washing, rinsing, drying)?: A Lot Help from another person to put on and taking off regular upper body clothing?: A Little Help from another person to put on and taking off regular lower body clothing?: A Lot 6 Click Score: 17    End of Session Equipment Utilized During Treatment: Gait belt;Rolling walker  OT Visit Diagnosis: Unsteadiness on feet (R26.81);Other abnormalities of gait and mobility (R26.89);Muscle weakness (generalized) (M62.81);Pain Pain - part of body: (back)   Activity Tolerance Patient limited by pain   Patient Left in bed;with call bell/phone within reach;with bed alarm set;with family/visitor present   Nurse Communication Mobility status        Time: 7858-8502 OT Time Calculation (min): 26 min  Charges: OT General Charges $OT Visit: 1 Visit OT Treatments $Self Care/Home Management : 23-37 mins    Simonne Come, [phone] 06/16/2018, 9:48 AM

## 2018-06-17 NOTE — Progress Notes (Signed)
[name] met with patient and patient's husband earlier today to inform them that she no longer qualifies for SNF placement as she is doing really well post surgery. Plan is to discharge home. RNCM aware.  [name] signing off.  Laveda Abbe, [place] Clinical Social Worker [phone]

## 2018-06-17 NOTE — Progress Notes (Signed)
Occupational Therapy Treatment Patient Details Name: Wendy Carrillo MRN: [id number] DOB: November 20, 1940 Today's Date: 06/17/2018    History of present illness  77 yo female s/p lumbar decompression L1-L4. PMH including arthritis, GERD, HTN, hepatitis, and Bil TKA.    OT comments  Pt making steady progress towards goals.  Pt currently requires cues and assistance for LB bathing and dressing due to back precautions.  Pt completed bathing at sit > stand level form toilet with assist for LB bathing and supervision with transfers and ambulation in room.  Pt would benefit from continued practice with AE for LB dressing as pt continues to demonstrate decreased hand strength to manage sock aide when donning socks. Pt's husband present throughout session. Pt will continue to benefit from OT acutely to prepare for d/c home with [facility].  Follow Up Recommendations  Supervision/Assistance - 24 hour;Home health OT    Equipment Recommendations  3 in 1 bedside commode    Recommendations for Other Services      Precautions / Restrictions Precautions Precautions: Fall;Back Precaution Comments: Pt able to recall 3/3 precautions Required Braces or Orthoses: Other Brace/Splint Other Brace/Splint: No brace per MD order Restrictions Weight Bearing Restrictions: No       Mobility Bed Mobility Overal bed mobility: Needs Assistance Bed Mobility: Rolling;Sit to Sidelying Rolling: Min guard       Sit to sidelying: Min assist General bed mobility comments: assistance required to bring bilat LE into bed; cues for sequencing   Transfers Overall transfer level: Needs assistance Equipment used: None Transfers: Sit to/from Stand Sit to Stand: Supervision         General transfer comment: supervision for safety; pt able to stand without AD    Balance Overall balance assessment: Needs assistance Sitting-balance support: No upper extremity supported;Feet supported Sitting balance-Leahy Scale: Good     Standing balance support: During functional activity;Single extremity supported Standing balance-Leahy Scale: Poor Standing balance comment: pt is able to static stand without UE support                           ADL either performed or assessed with clinical judgement   ADL Overall ADL's : Needs assistance/impaired     Grooming: Wash/dry hands;Standing;Wash/dry face;Supervision/safety   Upper Body Bathing: Set up;Sitting   Lower Body Bathing: Moderate assistance;Sit to/from stand Lower Body Bathing Details (indicate cue type and reason): pt did not wash feet, would require long handled sponge to wash feet due to back precautions.  Therapist assisted with washing buttocks in standing. Upper Body Dressing : Set up;Sitting   Lower Body Dressing: Moderate assistance;Cueing for back precautions;Sit to/from stand Lower Body Dressing Details (indicate cue type and reason): Pt continues to require cues to adhere to back precautions while completing LB dressing and would benefit from additional education on use of AE to increase independence with LB dressing (donning socks) Toilet Transfer: Supervision/safety;RW   Toileting- Clothing Manipulation and Hygiene: Supervision/safety;Sit to/from stand       Functional mobility during ADLs: Supervision/safety;Rolling walker General ADL Comments: Pt demonstrating improved ambulation and bathroom transfers this session.  Overall supervision with RW.  Continues to require cues for back precautions with LB dressing and will benefit from continued education with AE.               Cognition Arousal/Alertness: Awake/alert Behavior During Therapy: WFL for tasks assessed/performed Overall Cognitive Status: Within Functional Limits for tasks assessed  General Comments husband present     Pertinent Vitals/ Pain       Pain Assessment: 0-10 Pain Score: 5  Faces Pain  Scale: Hurts little more Pain Location: back Pain Descriptors / Indicators: Sore;Guarding Pain Intervention(s): Limited activity within patient's tolerance;Monitored during session;Premedicated before session         Frequency  Min 3X/week        Progress Toward Goals  OT Goals(current goals can now be found in the care plan section)  Progress towards OT goals: Progressing toward goals  Acute Rehab OT Goals Patient Stated Goal: to get back to independent OT Goal Formulation: With patient Time For Goal Achievement: 06/23/18 Potential to Achieve Goals: Good  Plan Discharge plan remains appropriate       AM-PAC PT "6 Clicks" Daily Activity     Outcome Measure   Help from another person eating meals?: None Help from another person taking care of personal grooming?: A Little Help from another person toileting, which includes using toliet, bedpan, or urinal?: A Little Help from another person bathing (including washing, rinsing, drying)?: A Little Help from another person to put on and taking off regular upper body clothing?: A Little Help from another person to put on and taking off regular lower body clothing?: A Lot 6 Click Score: 18    End of Session Equipment Utilized During Treatment: Gait belt;Rolling walker  OT Visit Diagnosis: Unsteadiness on feet (R26.81);Other abnormalities of gait and mobility (R26.89);Muscle weakness (generalized) (M62.81);Pain Pain - part of body: (back)   Activity Tolerance Patient tolerated treatment well   Patient Left in bed;with call bell/phone within reach;with bed alarm set;with family/visitor present   Nurse Communication Mobility status        Time: 7681-1572 OT Time Calculation (min): 27 min  Charges: OT General Charges $OT Visit: 1 Visit OT Treatments $Self Care/Home Management : 23-37 mins    Simonne Come, [phone] 06/17/2018, 12:37 PM

## 2018-06-17 NOTE — Progress Notes (Signed)
CM spoke to patients daughter, Wendy Carrillo, this am about d/c planning. Pt is ambulating 240 feet with supervision. CM informed DJ of this and that most likely insurance is not going to pay for SNF rehab with her doing so well. CM informed her that the MD felt she was ready for d/c today. DJ still interested in her going to [name] or [name]. [name] updated.   At about 2pm CM spoke to DJ again and informed her that [name] doesn't have any beds and that [name] would start authorization but the admissions person also felt that insurance would not cover SNF rehab with the patient walking 240 feet with only supervision. CM inquired about [name] services. DJ not familiar with any services and was interested in a copy of the [name] agencies being left in the room for her to see when she arrived at the hospital at 4:30 pm. CM informed her that either CM would meet with her then or she could call CM with a decision. CM left phone number for CM on the [name] list.   4pm: CM went to discuss DME with the patient and pt has discharged home with her spouse. CM called DJ and she was aware her parents were leaving. CM inquired about HH and she will review the [name] choices and call CM with a HH choice. DJ states the patient has all recommended DME at home.

## 2018-06-17 NOTE — Discharge Summary (Signed)
Physician Discharge Summary  Patient ID: Wendy Carrillo MRN: [id number] DOB/AGE: 04/07/1941 77 y.o.  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 The patient tolerated the procedure well and was taken to the recovery room and then to the floor in stable condition. The hospital course was routine. There were no complications. The wound remained clean dry and intact. Pt had appropriate back soreness. No complaints of leg pain or new N/T/W. The patient remained afebrile with stable vital signs, and tolerated a regular diet. The patient continued to increase activities, and pain was well controlled with oral pain medications.   Consults: None  Significant Diagnostic Studies:  Results for orders placed or performed during the hospital encounter of 06/02/18  Surgical pcr screen  Result Value Ref Range   MRSA, PCR NEGATIVE NEGATIVE   Staphylococcus aureus NEGATIVE NEGATIVE  Basic metabolic panel  Result Value Ref Range   Sodium 140 135 - 145 mmol/L   Potassium 3.6 3.5 - 5.1 mmol/L   Chloride 105 98 - 111 mmol/L   CO2 28 22 - 32 mmol/L   Glucose, Bld 116 (H) 70 - 99 mg/dL   BUN 14 8 - 23 mg/dL   Creatinine, Ser 0.85 0.44 - 1.00 mg/dL   Calcium 9.0 8.9 - 10.3 mg/dL   GFR calc non Af Amer >60 >60 mL/min   GFR calc Af Amer >60 >60 mL/min   Anion gap 7 5 - 15  CBC  Result Value Ref Range   WBC 5.4 4.0 - 10.5 K/uL   RBC 4.08 3.87 - 5.11 MIL/uL   Hemoglobin 11.8 (L) 12.0 - 15.0 g/dL   HCT 38.2 36.0 - 46.0 %   MCV 93.6 78.0 -  100.0 fL   MCH 28.9 26.0 - 34.0 pg   MCHC 30.9 30.0 - 36.0 g/dL   RDW 13.2 11.5 - 15.5 %   Platelets 215 150 - 400 K/uL    Dg Lumbar Spine 2-3 Views  Result Date: 06/09/2018 CLINICAL DATA:  Bilateral L2 through L4 laminectomy and foraminotomy. EXAM: LUMBAR SPINE - 2-3 VIEW COMPARISON:  MRI of February 01, 2018. FINDINGS: Two intraoperative cross-table lateral projections were obtained of the lumbar spine. The first submitted image does not demonstrate any definite localization. The second image demonstrates surgical probe directed toward the posterior elements of L2. IMPRESSION: Surgical localization as described above. Electronically Signed   By: Marijo Conception, M.D.   On: 06/09/2018 13:07    Antibiotics:  Anti-infectives (From admission, onward)   Start     Dose/Rate Route Frequency Ordered Stop   06/09/18 1630  ceFAZolin (ANCEF) IVPB 2g/100 mL premix     2 g 200 mL/hr over 30 Minutes Intravenous Every 8 hours 06/09/18 1350 06/10/18 0053   06/09/18 0936  bacitracin 50,000 Units in sodium chloride 0.9 % 500 mL irrigation  Status:  Discontinued       As needed 06/09/18 0936 06/09/18 1111   06/09/18 0831  vancomycin (VANCOCIN) 1-5 GM/200ML-% IVPB    Note to Pharmacy:  Granville Lewis, Lindsi   : cabinet override  06/09/18 0831 06/09/18 2044      Discharge Exam: Blood pressure 110/64, pulse 83, temperature 98.1 F (36.7 C), temperature source Oral, resp. rate 20, height 5' 4.5" (1.638 m), weight 112.9 kg, SpO2 100 %. Neurologic: Grossly normal Ambulating and voiding well  Discharge Medications:   Allergies as of 06/17/2018      Reactions   Contrast Media [iodinated Diagnostic Agents] Swelling, Rash   Face, tongue   Penicillins Rash, Other (See Comments)   PATIENT HAS HAD A PCN REACTION WITH IMMEDIATE RASH, FACIAL/TONGUE/THROAT SWELLING, SOB, OR LIGHTHEADEDNESS WITH HYPOTENSION:  #  #  YES  #  #  Has patient had a PCN reaction causing severe rash involving mucus membranes or skin necrosis:  NO Has patient had a PCN reaction that required hospitalization NO Has patient had a PCN reaction occurring within the last 10 years: NO If all of the above answers are "NO", then may proceed with Cephalosporin use.   Baclofen Itching   Latex Rash   Other Dermatitis   Bandages caused redness       Medication List    TAKE these medications   acetaminophen 500 MG tablet Commonly known as:  TYLENOL Take 1,000 mg by mouth 2 (two) times daily as needed for moderate pain.   ALPRAZolam 0.25 MG tablet Commonly known as:  XANAX Take 1 tablet (0.25 mg total) by mouth daily as needed. What changed:  reasons to take this   celecoxib 200 MG capsule Commonly known as:  CELEBREX Take 200 mg by mouth daily.   cetirizine 10 MG tablet Commonly known as:  ZYRTEC Take 10 mg by mouth every evening.   fluticasone 50 MCG/ACT nasal spray Commonly known as:  FLONASE Place 1 spray into both nostrils daily as needed for allergies.   furosemide 40 MG tablet Commonly known as:  LASIX Take 40 mg by mouth daily.   gabapentin 300 MG capsule Commonly known as:  NEURONTIN Take 300 mg by mouth 2 (two) times daily.   HYDROcodone-acetaminophen 5-325 MG tablet Commonly known as:  NORCO/VICODIN 1 po q 6 hrs prn pain What changed:    how much to take  how to take this  when to take this  reasons to take this  additional instructions   pantoprazole 20 MG tablet Commonly known as:  PROTONIX Take 1 tablet (20 mg total) by mouth daily. What changed:  when to take this   ramipril 2.5 MG capsule Commonly known as:  ALTACE Take 2.5 mg by mouth daily.   ranitidine 300 MG tablet Commonly known as:  ZANTAC Take 300 mg by mouth daily before breakfast.   traMADol 50 MG tablet Commonly known as:  ULTRAM Take 100 mg by mouth every 6 (six) hours as needed for severe pain.   Vitamin D (Ergocalciferol) 50000 units Caps capsule Commonly known as:  DRISDOL Take 50,000 Units by mouth every  Thursday.       Disposition: home   Final Dx: lumbar laminectomy  Discharge Instructions    Diet - low sodium heart healthy   Complete by:  As directed    Increase activity slowly   Complete by:  As directed          Signed: Ocie Cornfield Naoki Migliaccio 06/17/2018, 2:33 PM

## 2018-06-17 NOTE — Progress Notes (Signed)
Physical Therapy Treatment Patient Details Name: Wendy Carrillo MRN: [id number] DOB: January 06, 1941 Today's Date: 06/17/2018    History of Present Illness  77 yo female s/p lumbar decompression L1-L4. PMH including arthritis, GERD, HTN, hepatitis, and Bil TKA.     PT Comments    Patient is making progress toward PT goals. Pt requires min A for returning to supine and overall supervision/min guard for OOB mobility. Family member present throughout session and actively participating. Current plan remains appropriate.    Follow Up Recommendations  Home health PT;Supervision for mobility/OOB     Equipment Recommendations  Rolling walker with 5" wheels    Recommendations for Other Services       Precautions / Restrictions Precautions Precautions: Fall;Back Precaution Comments: Pt able to recall 3/3 precautions Required Braces or Orthoses: Other Brace/Splint Other Brace/Splint: No brace per MD order    Mobility  Bed Mobility Overal bed mobility: Needs Assistance Bed Mobility: Rolling;Sit to Sidelying Rolling: Min guard       Sit to sidelying: Min assist General bed mobility comments: assistance required to bring bilat LE into bed; cues for sequencing   Transfers Overall transfer level: Needs assistance Equipment used: None Transfers: Sit to/from Stand Sit to Stand: Supervision         General transfer comment: supervision for safety; pt able to stand without AD  Ambulation/Gait Ambulation/Gait assistance: Min guard;Supervision Gait Distance (Feet): 240 Feet Assistive device: Rolling walker (2 wheeled) Gait Pattern/deviations: Step-through pattern;Decreased stride length Gait velocity: decreased   General Gait Details: cues for increased stride length and cadence   Stairs             Wheelchair Mobility    Modified Rankin (Stroke Patients Only)       Balance Overall balance assessment: Needs assistance Sitting-balance support: No upper extremity  supported;Feet supported Sitting balance-Leahy Scale: Good     Standing balance support: During functional activity;Single extremity supported Standing balance-Leahy Scale: Poor Standing balance comment: pt is able to static stand without UE support                            Cognition Arousal/Alertness: Awake/alert Behavior During Therapy: WFL for tasks assessed/performed Overall Cognitive Status: Within Functional Limits for tasks assessed                                        Exercises      General Comments General comments (skin integrity, edema, etc.): husband present       Pertinent Vitals/Pain Pain Assessment: Faces Faces Pain Scale: Hurts little more Pain Location: back Pain Descriptors / Indicators: Sore;Guarding Pain Intervention(s): Monitored during session;Repositioned;Patient requesting pain meds-RN notified    Home Living                      Prior Function            PT Goals (current goals can now be found in the care plan section) Acute Rehab PT Goals PT Goal Formulation: With patient/family Time For Goal Achievement: 06/24/18 Potential to Achieve Goals: Good Progress towards PT goals: Progressing toward goals    Frequency    Min 5X/week      PT Plan Current plan remains appropriate    Co-evaluation              AM-PAC PT "6  Clicks" Daily Activity  Outcome Measure  Difficulty turning over in bed (including adjusting bedclothes, sheets and blankets)?: A Little Difficulty moving from lying on back to sitting on the side of the bed? : A Little Difficulty sitting down on and standing up from a chair with arms (e.g., wheelchair, bedside commode, etc,.)?: A Little Help needed moving to and from a bed to chair (including a wheelchair)?: A Little Help needed walking in hospital room?: A Little Help needed climbing 3-5 steps with a railing? : A Little 6 Click Score: 18    End of Session Equipment  Utilized During Treatment: Gait belt Activity Tolerance: Patient tolerated treatment well Patient left: with call bell/phone within reach;with family/visitor present;in bed Nurse Communication: Mobility status PT Visit Diagnosis: Unsteadiness on feet (R26.81);Pain;Muscle weakness (generalized) (M62.81) Pain - part of body: (back)     Time: 8616-8372 PT Time Calculation (min) (ACUTE ONLY): 19 min  Charges:  $Gait Training: 8-22 mins                     Earney Navy, PTA Pager: [phone]     Darliss Cheney 06/17/2018, 8:56 AM

## 2018-06-18 NOTE — Care Management (Addendum)
06/18/2018 0947 am: left message for Pts daughter, Radonna Ricker, about setting up [facility] services. Expressed importance in message of getting it set up sooner than later to have therapies see her at home.   06/18/2018 10:00 am:: received phone call from DJ that they would like to use [facility] for [facility]. CM notified Butch Penny with [facility] and she accepted the referral.

## 2018-11-28 ENCOUNTER — Ambulatory Visit (INDEPENDENT_AMBULATORY_CARE_PROVIDER_SITE_OTHER): Payer: Medicare Other

## 2018-11-28 ENCOUNTER — Ambulatory Visit (INDEPENDENT_AMBULATORY_CARE_PROVIDER_SITE_OTHER): Payer: Medicare Other | Admitting: Orthopedic Surgery

## 2018-11-28 ENCOUNTER — Encounter (INDEPENDENT_AMBULATORY_CARE_PROVIDER_SITE_OTHER): Payer: Self-pay | Admitting: Orthopedic Surgery

## 2018-11-28 ENCOUNTER — Ambulatory Visit (INDEPENDENT_AMBULATORY_CARE_PROVIDER_SITE_OTHER): Payer: Self-pay

## 2018-11-28 DIAGNOSIS — M25562 Pain in left knee: Secondary | ICD-10-CM

## 2018-11-28 DIAGNOSIS — Z96659 Presence of unspecified artificial knee joint: Secondary | ICD-10-CM | POA: Diagnosis not present

## 2018-11-28 DIAGNOSIS — G8929 Other chronic pain: Secondary | ICD-10-CM | POA: Diagnosis not present

## 2018-11-28 DIAGNOSIS — M25561 Pain in right knee: Secondary | ICD-10-CM

## 2018-11-28 MED ORDER — ACETAMINOPHEN-CODEINE #3 300-30 MG PO TABS: 1.0000 | ORAL_TABLET | Freq: Three times a day (TID) | ORAL | 0 refills | Status: DC | PRN

## 2018-12-01 ENCOUNTER — Encounter (INDEPENDENT_AMBULATORY_CARE_PROVIDER_SITE_OTHER): Payer: Self-pay | Admitting: Orthopedic Surgery

## 2018-12-01 NOTE — Progress Notes (Signed)
Office Visit Note   Patient: Wendy Carrillo           Date of Birth: 1941/10/25           MRN: [id number] Visit Date: 11/28/2018 Requested by: Wendy Panda, MD [address] [place], [place] [address] PCP: Wendy Panda, MD  Subjective: Chief Complaint  Patient presents with  . Left Knee - Injury    HPI: Wendy Carrillo is a patient with bilateral knee pain.  She had total knee replacement about 20 years ago.  She feels like both knees are swollen and she reports pain on the left-hand side from the thigh down to the leg.  Pain is worse with sitting going to standing.  She has had tramadol which is not been helpful.  She has a history of back surgery also.  She reports groin pain as well.  The pain wakes her from sleep at night.  Reports some occasional fevers and chills.  She had the knee surgery over 20 years ago.  She also reports some new left great toe numbness and tingling.  The pain is not activity related.              ROS: All systems reviewed are negative as they relate to the chief complaint within the history of present illness.  Patient denies  fevers or chills.   Assessment & Plan: Visit Diagnoses:  1. Left knee pain, unspecified chronicity   2. Chronic pain of both knees     Plan: Impression is left leg pain unclear etiology.  Could be loosening of that total knee replacement although radiographs look only marginally changed from previous studies.  I will try her with Tylenol 3 just that she can get some sleep.  She does have arthritis on plain radiographs of the hip but it is bilateral.  Her exam findings also are consistent with potentially some hip arthritis.  Plan MRI pelvis to evaluate that left-sided hip arthritis see if she has an effusion in the hip.  I will see her back after bone scan also bilateral lower extremity rule out loosening of those knee prostheses.  Follow-Up Instructions: Return for after MRI.   Orders:  Orders Placed This Encounter  Procedures  . XR  Knee 1-2 Views Left  . XR HIP UNILAT W OR W/O PELVIS 2-3 VIEWS LEFT  . MR Pelvis w/o contrast  . NM Bone Scan 3 Phase Lower Extremity   Meds ordered this encounter  Medications  . acetaminophen-codeine (TYLENOL #3) 300-30 MG tablet    Sig: Take 1 tablet by mouth every 8 (eight) hours as needed for moderate pain.    Dispense:  40 tablet    Refill:  0      Procedures: No procedures performed   Clinical Data: No additional findings.  Objective: Vital Signs: There were no vitals taken for this visit.  Physical Exam:   Constitutional: Patient appears well-developed HEENT:  Head: Normocephalic Eyes:EOM are normal Neck: Normal range of motion Cardiovascular: Normal rate Pulmonary/chest: Effort normal Neurologic: Patient is alert Skin: Skin is warm Psychiatric: Patient has normal mood and affect    Ortho Exam: Ortho exam demonstrates full active and passive range of motion of the hips and ankles.  Does have some wrist pain with internal rotation on the left which is not present on the right.  Extensor mechanism is intact and there is no effusion in either knee.  No warmth in either knee.  Collaterals are stable.  Some pain with  forward lateral bending but no trochanteric tenderness is noted in that left hip region.  Specialty Comments:  No specialty comments available.  Imaging: No results found.   PMFS History: Patient Active Problem List   Diagnosis Date Noted  . Benign essential HTN   . History of total bilateral knee replacement (TKR)   . Acute blood loss anemia   . Post-operative pain   . Spinal stenosis of lumbar region 06/09/2018  . Spinal stenosis, lumbar   . Hypertension   . History of duodenal ulcer   . Hepatitis   . Gastritis   . Family history of adverse reaction to anesthesia   . Chronic lower back pain   . Arthritis   . Coronary artery calcification 10/21/2017  . Combined forms of age-related cataract of right eye 04/18/2017  . Status post total  bilateral knee replacement 06/08/2016  . Left lumbar radiculopathy 05/09/2016  . Bilateral shoulder pain 03/22/2016  . DDD (degenerative disc disease), cervical 03/22/2016  . Morbid obesity with BMI of 40.0-44.9, adult ([place]) 03/08/2016  . Spondylolisthesis, 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/2016  . Neck pain 03/08/2016  . Lumbar stenosis 03/08/2016  . Cervical spinal stenosis 03/08/2016  . Back pain 11/16/2015  . 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/10/2015  . Angina pectoris ([place]) 04/20/2015  . Morbid obesity ([place]) 04/20/2015  . Dyspnea 04/20/2015  . Atypical chest pain 04/20/2015  . Esophageal reflux 02/23/2015  . Essential hypertension 02/23/2015  . Chronic back pain 02/23/2015  . Chest pain 02/22/2015  . Abdominal tenderness, LLQ (left lower quadrant) 06/15/2014  . Acute sinusitis 06/15/2014  . Allergic rhinitis due to pollen 06/15/2014  . Bladder disorder 06/15/2014  . Epistaxis 06/15/2014  . Female stress incontinence 06/15/2014  . 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 Insomnia 06/15/2014  . Localized primary osteoarthritis of wrist 06/15/2014  . Lumbosacral spondylosis 06/15/2014  . Osteoarthrosis, localized, primary, hand 06/15/2014  . Referred otalgia 06/15/2014  . Tingling 06/15/2014  . Tinnitus 06/15/2014  . Urinary incontinence 06/15/2014  . Cystocele 05/04/2014  . Retention of urine 05/04/2014  . Cystocele, lateral 04/12/2014  . Rectocele 03/17/2014  . History of blood transfusion 11/05/1990   Past Medical History:  Diagnosis Date  . Arthritis    "all over"  . Chronic lower back pain   . Coronary artery calcification 10/21/2017  . Family history of adverse reaction to anesthesia    "one of my son's passed out"  . Gastritis   . GERD (gastroesophageal reflux disease)   . 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 History of blood transfusion 1992   "related to knee replacement"  . History of duodenal ulcer   .  Hypertension   . PONV (postoperative nausea and vomiting)   . Spinal headache   . Spinal stenosis, lumbar     Family History  Problem Relation Age of Onset  . Hypertension Mother   . Stroke Mother   . Diabetes Mother   . Heart attack Father   . Breast cancer Sister   . Leukemia Brother   . Breast cancer Sister   . Breast cancer Sister   . Heart disease Brother     Past Surgical History:  Procedure Laterality Date  . ABDOMINAL HERNIA REPAIR    . ANTERIOR AND POSTERIOR VAGINAL REPAIR  2015  . APPENDECTOMY  ?1980's  . BLADDER SUSPENSION    . BREAST CYST EXCISION Bilateral   . BREAST SURGERY Left    "  nipple taken off"  . CARDIAC CATHETERIZATION    . CARPAL TUNNEL RELEASE Bilateral 2010's  . CATARACT EXTRACTION W/ INTRAOCULAR LENS IMPLANT Left 2014  . CHOLECYSTECTOMY OPEN  1980's  . DILATION AND CURETTAGE OF UTERUS  "several"  . HERNIA REPAIR    . JOINT REPLACEMENT    . LEFT HEART CATH AND CORONARY ANGIOGRAPHY N/A 10/23/2017   Procedure: LEFT HEART CATH AND CORONARY ANGIOGRAPHY;  Surgeon: Troy Sine, MD;  Location: [facility];  Service: Cardiovascular;  Laterality: N/A;  . LUMBAR LAMINECTOMY/DECOMPRESSION MICRODISCECTOMY Bilateral 06/09/2018   Procedure: Laminectomy and Foraminotomy - Lumbar Two,Lumbar Three - Lumbar One- Two - Lumbar Three-Four- bilateral;  Surgeon: Kary Kos, MD;  Location: [facility];  Service: Neurosurgery;  Laterality: Bilateral;  . PATELLA FRACTURE SURGERY Left "after 1992"   "after the replacement"  . REDUCTION MAMMAPLASTY Bilateral   . REPLACEMENT TOTAL KNEE Bilateral    "left in 1992; right in 2001"  . VAGINAL HYSTERECTOMY  1972   Social History   Occupational History  . Not on file  Tobacco Use  . Smoking status: Never Smoker  . Smokeless tobacco: Never Used  Substance and Sexual Activity  . Alcohol use: No  . Drug use: No  . Sexual activity: Not on file

## 2018-12-06 ENCOUNTER — Ambulatory Visit (HOSPITAL_BASED_OUTPATIENT_CLINIC_OR_DEPARTMENT_OTHER)
Admission: RE | Admit: 2018-12-06 | Discharge: 2018-12-06 | Disposition: A | Discharge status: Home / Self Care | Payer: Medicare Other | Source: Ambulatory Visit | Attending: Orthopedic Surgery | Admitting: Orthopedic Surgery

## 2018-12-06 DIAGNOSIS — M25562 Pain in left knee: Secondary | ICD-10-CM | POA: Insufficient documentation

## 2018-12-06 DIAGNOSIS — G8929 Other chronic pain: Secondary | ICD-10-CM | POA: Diagnosis present

## 2018-12-06 DIAGNOSIS — M25561 Pain in right knee: Secondary | ICD-10-CM | POA: Insufficient documentation

## 2018-12-08 ENCOUNTER — Encounter (HOSPITAL_COMMUNITY)
Admission: RE | Admit: 2018-12-08 | Discharge: 2018-12-08 | Disposition: A | Discharge status: Home / Self Care | Payer: Medicare Other | Source: Ambulatory Visit | Attending: Orthopedic Surgery | Admitting: Orthopedic Surgery

## 2018-12-08 ENCOUNTER — Telehealth (INDEPENDENT_AMBULATORY_CARE_PROVIDER_SITE_OTHER): Payer: Self-pay | Admitting: *Deleted

## 2018-12-08 ENCOUNTER — Encounter (HOSPITAL_COMMUNITY): Payer: Medicare Other

## 2018-12-08 DIAGNOSIS — M25561 Pain in right knee: Secondary | ICD-10-CM | POA: Insufficient documentation

## 2018-12-08 DIAGNOSIS — G8929 Other chronic pain: Secondary | ICD-10-CM | POA: Insufficient documentation

## 2018-12-08 DIAGNOSIS — M25562 Pain in left knee: Secondary | ICD-10-CM | POA: Insufficient documentation

## 2018-12-08 NOTE — Telephone Encounter (Signed)
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 I explained to pt I have sent all information back to her insurance and that I had spoken with Doyle Askew by skype and email telling her this is still pending. Pt was upset that she had to sit and wait. I tried to explain to her that insurance does take a while and she should not have been scheduled until authorization was approved or she should have been called so they can cancel her appt until approved. THe lady at the reception tried to explain it to her and she seemed like she understood.

## 2018-12-12 ENCOUNTER — Ambulatory Visit (HOSPITAL_COMMUNITY)
Admission: RE | Admit: 2018-12-12 | Discharge: 2018-12-12 | Disposition: A | Discharge status: Home / Self Care | Payer: Medicare Other | Source: Ambulatory Visit | Attending: Orthopedic Surgery | Admitting: Orthopedic Surgery

## 2018-12-12 DIAGNOSIS — M25562 Pain in left knee: Secondary | ICD-10-CM | POA: Diagnosis not present

## 2018-12-12 DIAGNOSIS — M25561 Pain in right knee: Secondary | ICD-10-CM | POA: Diagnosis present

## 2018-12-12 DIAGNOSIS — Z96653 Presence of artificial knee joint, bilateral: Secondary | ICD-10-CM | POA: Diagnosis not present

## 2018-12-12 DIAGNOSIS — R937 Abnormal findings on diagnostic imaging of other parts of musculoskeletal system: Secondary | ICD-10-CM | POA: Diagnosis not present

## 2018-12-12 DIAGNOSIS — G8929 Other chronic pain: Secondary | ICD-10-CM | POA: Insufficient documentation

## 2018-12-12 MED ORDER — TECHNETIUM TC 99M MEDRONATE IV KIT
20.0000 | PACK | Freq: Once | INTRAVENOUS | Status: AC | PRN
  Administered 2018-12-12: 20 via INTRAVENOUS

## 2018-12-15 ENCOUNTER — Ambulatory Visit (INDEPENDENT_AMBULATORY_CARE_PROVIDER_SITE_OTHER): Payer: Medicare Other | Admitting: Orthopedic Surgery

## 2018-12-15 ENCOUNTER — Ambulatory Visit (INDEPENDENT_AMBULATORY_CARE_PROVIDER_SITE_OTHER): Payer: Self-pay

## 2018-12-15 ENCOUNTER — Encounter (INDEPENDENT_AMBULATORY_CARE_PROVIDER_SITE_OTHER): Payer: Self-pay | Admitting: Orthopedic Surgery

## 2018-12-15 VITALS — Ht 64.5 in | Wt 249.0 lb

## 2018-12-15 DIAGNOSIS — M1612 Unilateral primary osteoarthritis, left hip: Secondary | ICD-10-CM | POA: Diagnosis not present

## 2018-12-15 NOTE — Progress Notes (Signed)
Office Visit Note   Patient: Wendy Carrillo           Date of Birth: 1941/07/05           MRN: [id number] Visit Date: 12/15/2018 Requested by: Jilda Panda, MD [address] [place],  [address] PCP: Jilda Panda, MD  Subjective: Chief Complaint  Patient presents with  . Left Hip - Follow-up    MRI REVIEW; Bil Hip/pelvis NM Bone Scan Review  . Right Hip - Follow-up    HPI: Wendy Carrillo is a patient returns after bone scan both knees and MRI of the pelvis.  Bone scan both knees show slight increase in uptake on the right knee but nothing too significant on the left knee.  MRI pelvis shows left greater than right moderate hip arthritis.  Patient continues to have pain which radiates down the front leg and into the knee.  She also had MRI scan last year which showed moderate left lateral recess stenosis and that was done in March 2019.  She had surgery in August and did well with that.  She is having a lot of pain with walking and is using a cane.  Taking Tylenol 3 which is helping some.              ROS: Normal ROS  sessment & Plan: Visit Diagnoses:  1. Unilateral primary osteoarthritis, left hip     Plan: Impression is left leg pain with fairly benign-appearing bone scan of that left total knee replacement which is 78 years old.  I think she does have some moderate left hip arthritis.  She also has potential pain generation in her back although her last clinic visit was good from Dr. Saintclair Halsted in December.  I think her best course of action is to get a diagnostic and therapeutic hip injection today or this week with Dr. Ernestina Patches.  We will see her back after that in about 4 weeks and then decide for or against any further intervention including hip replacement or MRI scanning of the back.  Follow-Up Instructions: Return in about 4 weeks (around 01/12/2019).   Orders:  No orders of the defined types were placed in this encounter.  No orders of the defined types were placed in this  encounter.     Procedures: No procedures performed   Clinical Data: No additional findings.  Objective: Vital Signs: Ht 5' 4.5" (1.638 m)   Wt 249 lb (112.9 kg)   BMI 42.08 kg/m   Physical Exam:   Constitutional: Patient appears well-developed HEENT:  Head: Normocephalic Eyes:EOM are normal Neck: Normal range of motion Cardiovascular: Normal rate Pulmonary/chest: Effort normal Neurologic: Patient is alert Skin: Skin is warm Psychiatric: Patient has normal mood and affect    Ortho Exam: Works Ortho exam demonstrates some groin pain with internal X rotation of that left hip.  She has no effusion in the left knee.  No nerve root tension signs.  Good ankle dorsiflexion plantarflexion quad hamstring strength.  Some pain with forward lateral bending.  Not much of a Trendelenburg gait today.  Specialty Comments:  No specialty comments available.  Imaging: No results found.   PMFS History: Patient Active Problem List   Diagnosis Date Noted  . Benign essential HTN   . History of total bilateral knee replacement (TKR)   . Acute blood loss anemia   . Post-operative pain   . Spinal stenosis of lumbar region 06/09/2018  . Spinal stenosis, lumbar   . Hypertension   .  History of duodenal ulcer   . Hepatitis   . Gastritis   . Family history of adverse reaction to anesthesia   . Chronic lower back pain   . Arthritis   . Coronary artery calcification 10/21/2017  . Combined forms of age-related cataract of right eye 04/18/2017  . Status post total bilateral knee replacement 06/08/2016  . Left lumbar radiculopathy 05/09/2016  . Bilateral shoulder pain 03/22/2016  . DDD (degenerative disc disease), cervical 03/22/2016  . Morbid obesity with BMI of 40.0-44.9, adult ([place]) 03/08/2016  . Spondylolisthesis, 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/2016  . Neck pain 03/08/2016  . Lumbar stenosis 03/08/2016  . Cervical spinal stenosis 03/08/2016  . Back pain 11/16/2015  . Osteoarthritis of  multiple joints 11/10/2015  . Angina pectoris ([place]) 04/20/2015  . Morbid obesity ([place]) 04/20/2015  . Dyspnea 04/20/2015  . Atypical chest pain 04/20/2015  . Esophageal reflux 02/23/2015  . Essential hypertension 02/23/2015  . Chronic back pain 02/23/2015  . Chest pain 02/22/2015  . Abdominal tenderness, LLQ (left lower quadrant) 06/15/2014  . Acute sinusitis 06/15/2014  . Allergic rhinitis due to pollen 06/15/2014  . Bladder disorder 06/15/2014  . Epistaxis 06/15/2014  . Female stress incontinence 06/15/2014  . 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 Insomnia 06/15/2014  . Localized primary osteoarthritis of wrist 06/15/2014  . Lumbosacral spondylosis 06/15/2014  . Osteoarthrosis, localized, primary, hand 06/15/2014  . Referred otalgia 06/15/2014  . Tingling 06/15/2014  . Tinnitus 06/15/2014  . Urinary incontinence 06/15/2014  . Cystocele 05/04/2014  . Retention of urine 05/04/2014  . Cystocele, lateral 04/12/2014  . Rectocele 03/17/2014  . History of blood transfusion 11/05/1990   Past Medical History:  Diagnosis Date  . Arthritis    "all over"  . Chronic lower back pain   . Coronary artery calcification 10/21/2017  . Family history of adverse reaction to anesthesia    "one of my son's passed out"  . Gastritis   . GERD (gastroesophageal reflux disease)   . 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 History of blood transfusion 1992   "related to knee replacement"  . History of duodenal ulcer   . Hypertension   . PONV (postoperative nausea and vomiting)   . Spinal headache   . Spinal stenosis, lumbar     Family History  Problem Relation Age of Onset  . Hypertension Mother   . Stroke Mother   . Diabetes Mother   . Heart attack Father   . Breast cancer Sister   . Leukemia Brother   . Breast cancer Sister   . Breast cancer Sister   . Heart disease Brother     Past Surgical History:  Procedure Laterality Date  .  ABDOMINAL HERNIA REPAIR    . ANTERIOR AND POSTERIOR VAGINAL REPAIR  2015  . APPENDECTOMY  ?1980's  . BLADDER SUSPENSION    . BREAST CYST EXCISION Bilateral   . BREAST SURGERY Left    "nipple taken off"  . CARDIAC CATHETERIZATION    . CARPAL TUNNEL RELEASE Bilateral 2010's  . CATARACT EXTRACTION W/ INTRAOCULAR LENS IMPLANT Left 2014  . CHOLECYSTECTOMY OPEN  1980's  . DILATION AND CURETTAGE OF UTERUS  "several"  . HERNIA REPAIR    . JOINT REPLACEMENT    . LEFT HEART CATH AND CORONARY ANGIOGRAPHY N/A 10/23/2017   Procedure: LEFT HEART CATH AND CORONARY ANGIOGRAPHY;  Surgeon: Troy Sine, MD;  Location: [facility];  Service:  Cardiovascular;  Laterality: N/A;  . LUMBAR LAMINECTOMY/DECOMPRESSION MICRODISCECTOMY Bilateral 06/09/2018   Procedure: Laminectomy and Foraminotomy - Lumbar Two,Lumbar Three - Lumbar One- Two - Lumbar Three-Four- bilateral;  Surgeon: Kary Kos, MD;  Location: [facility];  Service: Neurosurgery;  Laterality: Bilateral;  . PATELLA FRACTURE SURGERY Left "after 1992"   "after the replacement"  . REDUCTION MAMMAPLASTY Bilateral   . REPLACEMENT TOTAL KNEE Bilateral    "left in 1992; right in 2001"  . VAGINAL HYSTERECTOMY  1972   Social History   Occupational History  . Not on file  Tobacco Use  . Smoking status: Never Smoker  . Smokeless tobacco: Never Used  Substance and Sexual Activity  . Alcohol use: No  . Drug use: No  . Sexual activity: Not on file

## 2018-12-15 NOTE — Progress Notes (Signed)
Wendy Carrillo - 78 y.o. female MRN [id number]  Date of birth: February 11, 1941  Office Visit Note: Visit Date: 12/15/2018 PCP: Jilda Panda, MD Referred by: Jilda Panda, MD  Subjective: Chief Complaint  Patient presents with  . Left Hip - Follow-up    MRI REVIEW; Bil Hip/pelvis NM Bone Scan Review  . Right Hip - Follow-up   HPI:  Wendy Carrillo is a 78 y.o. 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  Patient saw Dr. Anderson Malta today with known left hip osteoarthritis and pain in left hip and groin referring to the knee.  Worse with internal rotation.  Patient does have contrast dye allergy.  We will use MRI contrast still should be able to see enough to do a fairly good intra-articular injection.  In the future if needed could premedicate.  Patient had dye allergy many years ago with some facial swelling but no anaphylaxis and this was with IV contrast.  ROS Otherwise per HPI.  Assessment & Plan: Visit Diagnoses:  1. Unilateral primary osteoarthritis, left hip     Plan: No additional findings.   Meds & Orders: No orders of the defined types were placed in this encounter.  No orders of the defined types were placed in this encounter.   Follow-up: Return in about 4 weeks (around 01/12/2019).   Procedures: Large Joint Inj: L hip joint on 12/15/2018 4:33 PM Indications: diagnostic evaluation and pain Details: 22 G 3.5 in needle, fluoroscopy-guided anterior approach  Arthrogram: No  Medications: 80 mg triamcinolone acetonide 40 MG/ML; 3 mL bupivacaine 0.5 % Outcome: tolerated well, no immediate complications  There was excellent flow of contrast producing a partial arthrogram of the hip. The patient did have relief of symptoms during the anesthetic phase of the injection. Procedure, treatment alternatives, risks and benefits explained, specific risks discussed. Consent was given by the patient. Immediately prior to procedure a  time out was called to verify the correct patient, procedure, equipment, support staff and site/side marked as required. Patient was prepped and draped in the usual sterile fashion.      No notes on file   Clinical History: EXAM: MRI LUMBAR SPINE WITHOUT CONTRAST  TECHNIQUE: Multiplanar, multisequence MR imaging of the lumbar spine was performed. No intravenous contrast was administered.  COMPARISON:  05/29/2017  FINDINGS: The study is mildly motion degraded despite repeated imaging attempts.  Segmentation: Standard.  Alignment: Mild S-shaped lumbar scoliosis. Unchanged grade 1 retrolisthesis of T12 on L1, L1 on L2, and L5 on S1 and grade 1 anterolisthesis of L4 on L5.  Vertebrae: No fracture or suspicious osseous lesion. Degenerative endplate changes at H47-M5 including minimal edema.  Conus medullaris and cauda equina: Conus extends to the L1-2 level. Conus and cauda equina appear normal.  Paraspinal and other soft tissues: Partially visualized large right upper pole renal cyst measuring 6 cm, similar to prior. 9 mm right lower pole renal cyst.  Disc levels:  Disc desiccation throughout the lumbar and included lower thoracic spine. Severe disc space narrowing at T12-L1 and L5-S1 with milder narrowing throughout the remainder of the lumbar and lower thoracic spine with exception of L4-5.  T12-L1: Circumferential disc osteophyte complex and retrolisthesis result in moderate to severe right neural foraminal stenosis without spinal stenosis, unchanged.  L1-2: Circumferential disc bulging and moderate facet and ligamentum flavum hypertrophy result in mild right and moderate left lateral recess stenosis and moderate to severe bilateral neural foraminal stenosis, unchanged. Prominent dorsal  epidural fat contributes to unchanged mild spinal stenosis.  L2-3: Circumferential disc bulging, moderate facet and ligamentum flavum hypertrophy, and prominent dorsal  epidural fat result in mild-to-moderate right and moderate left lateral recess stenosis, moderate spinal stenosis, and mild-to-moderate right and moderate left neural foraminal stenosis, overall slightly progressed from prior (most notably the spinal and left lateral recess stenosis).  L3-4: Circumferential disc bulging and severe facet and ligamentum flavum hypertrophy result in moderate spinal stenosis, mild-to-moderate bilateral lateral recess stenosis, and mild-to-moderate right and moderate left neural foraminal stenosis, unchanged.  L4-5: Anterolisthesis with mild rightward bulging of uncovered disc and severe facet and ligamentum flavum hypertrophy result in mild to moderate spinal stenosis, mild-to-moderate right and mild left lateral recess stenosis, and moderate right and mild left neural foraminal stenosis, unchanged.  L5-S1: Circumferential disc bulging, severe disc space height loss, and moderate facet hypertrophy result in mild-to-moderate bilateral lateral recess and severe bilateral neural foraminal stenosis without spinal stenosis, unchanged.  IMPRESSION: 1. Slightly progressive findings at L2-3 including moderate spinal stenosis, moderate left lateral recess stenosis, and moderate left neural foraminal stenosis. 2. Unchanged disc and facet degeneration elsewhere as above.   Electronically Signed   By: [name] M.D.   On: 02/01/2018 14:40     Objective:  VS:  HT:5' 4.5" (163.8 cm)   WT:249 lb (112.9 kg)  BMI:42.1    BP:   HR: bpm  TEMP: ( )  RESP:  Physical Exam  Ortho Exam Imaging: No results found.

## 2018-12-16 MED ORDER — TRIAMCINOLONE ACETONIDE 40 MG/ML IJ SUSP
80.0000 mg | INTRAMUSCULAR | Status: AC | PRN
  Administered 2018-12-15: 80 mg via INTRA_ARTICULAR

## 2018-12-16 MED ORDER — BUPIVACAINE HCL 0.5 % IJ SOLN
3.0000 mL | INTRAMUSCULAR | Status: AC | PRN
  Administered 2018-12-15: 3 mL via INTRA_ARTICULAR

## 2019-01-19 ENCOUNTER — Ambulatory Visit (INDEPENDENT_AMBULATORY_CARE_PROVIDER_SITE_OTHER): Payer: Medicare Other | Admitting: Orthopedic Surgery

## 2019-01-23 ENCOUNTER — Telehealth (INDEPENDENT_AMBULATORY_CARE_PROVIDER_SITE_OTHER): Payer: Self-pay | Admitting: Orthopedic Surgery

## 2019-01-23 NOTE — Telephone Encounter (Signed)
Pt called in said she would like a call back from [place] to discuss some concerns [phone]
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
## 2019-01-26 NOTE — Telephone Encounter (Signed)
IC patient to discuss per Dr Marlou Sa. After a very long conversation, note written stating simply patient seen by Dr Marlou Sa 01/2018 and was referred for MRI scan then sent to neurosurgery for further eval.
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
## 2019-03-06 ENCOUNTER — Other Ambulatory Visit: Payer: Self-pay | Admitting: Neurosurgery

## 2019-03-06 DIAGNOSIS — N281 Cyst of kidney, acquired: Secondary | ICD-10-CM

## 2019-03-13 ENCOUNTER — Other Ambulatory Visit: Payer: Self-pay

## 2019-03-13 ENCOUNTER — Ambulatory Visit
Admission: RE | Admit: 2019-03-13 | Discharge: 2019-03-13 | Disposition: A | Discharge status: Home / Self Care | Payer: Medicare Other | Source: Ambulatory Visit | Attending: Neurosurgery | Admitting: Neurosurgery

## 2019-03-13 DIAGNOSIS — N281 Cyst of kidney, acquired: Secondary | ICD-10-CM

## 2019-04-27 ENCOUNTER — Other Ambulatory Visit: Payer: Self-pay | Admitting: Neurosurgery

## 2019-05-13 NOTE — Pre-Procedure Instructions (Signed)
CHALISE PE  05/13/2019      [id number] - [place], [place] [address] [place] [address] Phone: [phone] Fax: [phone]    Your procedure is scheduled on July 13  Report to [facility] Entrance A at 11:30 A.M.  Call this number if you have problems the morning of surgery:  [phone]   Remember:  Do not eat or drink after midnight.     Take these medicines the morning of surgery with A SIP OF WATER:              Alprazolam (xanax)             Cetirizine (zyrtec)             Gabapentin (neurontin)             tolterodine (detrol LA)             Tramadol               7 days prior to surgery STOP taking any Aspirin (unless otherwise instructed by your surgeon), Aleve, Naproxen, Ibuprofen, Motrin, Advil, Goody's, BC's, all herbal medications, fish oil, and all vitamins, celebrex.    Do not wear jewelry, make-up or nail polish.  Do not wear lotions, powders, or perfumes, or deodorant.  Do not shave 48 hours prior to surgery.  Men may shave face and neck.  Do not bring valuables to the hospital.  [facility] is not responsible for any belongings or valuables.  Contacts, dentures or bridgework may not be worn into surgery.  Leave your suitcase in the car.  After surgery it may be brought to your room.  For patients admitted to the hospital, discharge time will be determined by your treatment team.  Patients discharged the day of surgery will not be allowed to drive home.   Special instructions:   [facility]- Preparing For Surgery  Before surgery, you can play an important role. Because skin is not sterile, your skin needs to be as free of germs as possible. You can reduce the number of germs on your skin by washing with CHG (chlorahexidine gluconate) Soap before surgery.  CHG is an antiseptic cleaner which kills germs and bonds with the skin to continue killing germs even after washing.     Oral Hygiene is also important to reduce your risk of infection.  Remember - BRUSH YOUR TEETH THE MORNING OF SURGERY WITH YOUR REGULAR TOOTHPASTE  Please do not use if you have an allergy to CHG or antibacterial soaps. If your skin becomes reddened/irritated stop using the CHG.  Do not shave (including legs and underarms) for at least 48 hours prior to first CHG shower. It is OK to shave your face.  Please follow these instructions carefully.   1. Shower the NIGHT BEFORE SURGERY and the MORNING OF SURGERY with CHG.   2. If you chose to wash your hair, wash your hair first as usual with your normal shampoo.  3. After you shampoo, rinse your hair and body thoroughly to remove the shampoo.  4. Use CHG as you would any other liquid soap. You can apply CHG directly to the skin and wash gently with a scrungie or a clean washcloth.   5. Apply the CHG Soap to your body ONLY FROM THE NECK DOWN.  Do not use on open wounds or open sores. Avoid contact with your eyes,  ears, mouth and genitals (private parts). Wash Face and genitals (private parts)  with your normal soap.  6. Wash thoroughly, paying special attention to the area where your surgery will be performed.  7. Thoroughly rinse your body with warm water from the neck down.  8. DO NOT shower/wash with your normal soap after using and rinsing off the CHG Soap.  9. Pat yourself dry with a CLEAN TOWEL.  10. Wear CLEAN PAJAMAS to bed the night before surgery, wear comfortable clothes the morning of surgery  11. Place CLEAN SHEETS on your bed the night of your first shower and DO NOT SLEEP WITH PETS.    Day of Surgery:  Do not apply any deodorants/lotions.  Please wear clean clothes to the hospital/surgery center.   Remember to brush your teeth WITH YOUR REGULAR TOOTHPASTE.    Please read over the following fact sheets that you were given. Coughing and Deep Breathing and Surgical Site Infection Prevention

## 2019-05-14 ENCOUNTER — Encounter (HOSPITAL_COMMUNITY)
Admission: RE | Admit: 2019-05-14 | Discharge: 2019-05-14 | Disposition: A | Discharge status: Home / Self Care | Payer: Medicare Other | Source: Ambulatory Visit | Attending: Neurosurgery | Admitting: Neurosurgery

## 2019-05-14 ENCOUNTER — Other Ambulatory Visit: Payer: Self-pay

## 2019-05-14 ENCOUNTER — Encounter (HOSPITAL_COMMUNITY): Payer: Self-pay

## 2019-05-14 ENCOUNTER — Other Ambulatory Visit (HOSPITAL_COMMUNITY)
Admission: RE | Admit: 2019-05-14 | Discharge: 2019-05-14 | Disposition: A | Discharge status: Home / Self Care | Payer: Medicare Other | Source: Ambulatory Visit | Attending: Neurosurgery | Admitting: Neurosurgery

## 2019-05-14 DIAGNOSIS — Z1159 Encounter for screening for other viral diseases: Secondary | ICD-10-CM | POA: Insufficient documentation

## 2019-05-14 DIAGNOSIS — M48061 Spinal stenosis, lumbar region without neurogenic claudication: Secondary | ICD-10-CM | POA: Insufficient documentation

## 2019-05-14 DIAGNOSIS — I517 Cardiomegaly: Secondary | ICD-10-CM | POA: Diagnosis not present

## 2019-05-14 DIAGNOSIS — Z01818 Encounter for other preprocedural examination: Secondary | ICD-10-CM | POA: Diagnosis not present

## 2019-05-14 DIAGNOSIS — R9431 Abnormal electrocardiogram [ECG] [EKG]: Secondary | ICD-10-CM | POA: Insufficient documentation

## 2019-05-14 LAB — CBC
HCT: 40.3 % (ref 36.0–46.0)
Hemoglobin: 12.7 g/dL (ref 12.0–15.0)
MCH: 29.2 pg (ref 26.0–34.0)
MCHC: 31.5 g/dL (ref 30.0–36.0)
MCV: 92.6 fL (ref 80.0–100.0)
Platelets: 206 10*3/uL (ref 150–400)
RBC: 4.35 MIL/uL (ref 3.87–5.11)
RDW: 13.3 % (ref 11.5–15.5)
WBC: 5.3 10*3/uL (ref 4.0–10.5)
nRBC: 0 % (ref 0.0–0.2)

## 2019-05-14 LAB — BASIC METABOLIC PANEL
Anion gap: 10 (ref 5–15)
BUN: 14 mg/dL (ref 8–23)
CO2: 26 mmol/L (ref 22–32)
Calcium: 8.8 mg/dL — ABNORMAL LOW (ref 8.9–10.3)
Chloride: 104 mmol/L (ref 98–111)
Creatinine, Ser: 0.87 mg/dL (ref 0.44–1.00)
GFR calc Af Amer: >60 mL/min (ref >60)
GFR calc non Af Amer: >60 mL/min (ref >60)
Glucose, Bld: 128 mg/dL — ABNORMAL HIGH (ref 70–99)
Potassium: 3.5 mmol/L (ref 3.5–5.1)
Sodium: 140 mmol/L (ref 135–145)

## 2019-05-14 LAB — SURGICAL PCR SCREEN
MRSA, PCR: NEGATIVE
Staphylococcus aureus: NEGATIVE

## 2019-05-14 NOTE — Progress Notes (Addendum)
PCP - Jilda Panda, MD Cardiologist - Skeet Latch, MD  Chest x-ray - pt denies EKG - 05/14/2019 at PAT appt  Stress Test - 04/2015 in EPIC ECHO - 11/08/2017 in EPIC  Cardiac Cath - 10/23/17 in EPIC  Sleep Study - pt denies CPAP - n/a  Fasting Blood Sugar - n/a Checks Blood Sugar _____ times a day-n/a  Blood Thinner Instructions: n/a Aspirin Instructions:  n/a  Anesthesia review: Yes, heart hx  Patient denies shortness of breath, fever, cough and chest pain at PAT appointment  Patient verbalized understanding of instructions that were given to them at the PAT appointment. Patient was also instructed that they will need to review over the PAT instructions again at home before surgery.

## 2019-05-15 LAB — SARS CORONAVIRUS 2 (TAT 6-24 HRS): SARS Coronavirus 2: NEGATIVE

## 2019-05-15 NOTE — Anesthesia Preprocedure Evaluation (Addendum)
Anesthesia Evaluation  Patient identified by MRN, date of birth, ID band Patient awake    Reviewed: Allergy & Precautions, NPO status , Patient's Chart, lab work & pertinent test results  History of Anesthesia Complications (+) PONV and history of anesthetic complications  Airway Mallampati: II  TM Distance: >3 FB Neck ROM: Full    Dental  (+) Missing, Dental Advisory Given,    Pulmonary neg pulmonary ROS,    Pulmonary exam normal breath sounds clear to auscultation       Cardiovascular hypertension, + angina + CAD  Normal cardiovascular exam Rhythm:Regular Rate:Normal     Neuro/Psych negative neurological ROS  negative psych ROS   GI/Hepatic Neg liver ROS, GERD  ,  Endo/Other  negative endocrine ROS  Renal/GU negative Renal ROS  negative genitourinary   Musculoskeletal  (+) Arthritis ,   Abdominal   Peds  Hematology negative hematology ROS (+)   Anesthesia Other Findings   Reproductive/Obstetrics                           Anesthesia Physical Anesthesia Plan  ASA: III  Anesthesia Plan: General   Post-op Pain Management:    Induction: Intravenous  PONV Risk Score and Plan: 4 or greater and Ondansetron, Dexamethasone, Treatment may vary due to age or medical condition, Scopolamine patch - Pre-op, Midazolam and Diphenhydramine  Airway Management Planned: Oral ETT  Additional Equipment:   Intra-op Plan:   Post-operative Plan: Extubation in OR  Informed Consent: I have reviewed the patients History and Physical, chart, labs and discussed the procedure including the risks, benefits and alternatives for the proposed anesthesia with the patient or authorized representative who has indicated his/her understanding and acceptance.     Dental advisory given  Plan Discussed with: CRNA  Anesthesia Plan Comments: (Evaluated by cardiology in 2019 for intermittent chest pain. She had a  cath showing nonobstructive disease, echo was WNL. Medical therapy recommended.  Echo 11/08/17: Study Conclusions  - Left ventricle: The cavity size was normal. Systolic function was normal. The estimated ejection fraction was in the range of 55% to 60%. Wall motion was normal; there were no regional wall motion abnormalities. Left ventricular diastolic function parameters were normal. - Left atrium: The atrium was mildly dilated.   LHC 10/23/17: There is mild coronary calcification of all 3 major coronary arteries without significant obstructive disease.  Dominant right coronary system. LVEDP 13 mmHg. RECOMMENDATION: Medical therapy with aggressive lipid-lowering treatment in light of subclinical atherosclerosis.)      Anesthesia Quick Evaluation

## 2019-05-18 ENCOUNTER — Ambulatory Visit (HOSPITAL_COMMUNITY): Payer: Medicare Other | Admitting: Physician Assistant

## 2019-05-18 ENCOUNTER — Inpatient Hospital Stay (HOSPITAL_COMMUNITY)
Admission: AD | Admit: 2019-05-18 | Discharge: 2019-05-21 | DRG: 552 | Disposition: A | Discharge status: Home Health | Payer: Medicare Other | Attending: Neurosurgery | Admitting: Neurosurgery

## 2019-05-18 ENCOUNTER — Other Ambulatory Visit: Payer: Self-pay

## 2019-05-18 ENCOUNTER — Encounter (HOSPITAL_COMMUNITY): Payer: Self-pay | Admitting: Surgery

## 2019-05-18 ENCOUNTER — Encounter (HOSPITAL_COMMUNITY)
Admission: AD | Disposition: A | Discharge status: Home Health | Payer: Self-pay | Source: Home / Self Care | Attending: Neurosurgery

## 2019-05-18 ENCOUNTER — Ambulatory Visit (HOSPITAL_COMMUNITY): Payer: Medicare Other | Admitting: Certified Registered Nurse Anesthetist

## 2019-05-18 DIAGNOSIS — Z880 Allergy status to penicillin: Secondary | ICD-10-CM

## 2019-05-18 DIAGNOSIS — M5442 Lumbago with sciatica, left side: Secondary | ICD-10-CM

## 2019-05-18 DIAGNOSIS — Z5309 Procedure and treatment not carried out because of other contraindication: Secondary | ICD-10-CM | POA: Diagnosis not present

## 2019-05-18 DIAGNOSIS — Z79891 Long term (current) use of opiate analgesic: Secondary | ICD-10-CM | POA: Diagnosis not present

## 2019-05-18 DIAGNOSIS — Z961 Presence of intraocular lens: Secondary | ICD-10-CM | POA: Diagnosis present

## 2019-05-18 DIAGNOSIS — Z90710 Acquired absence of both cervix and uterus: Secondary | ICD-10-CM

## 2019-05-18 DIAGNOSIS — M545 Low back pain: Secondary | ICD-10-CM | POA: Diagnosis present

## 2019-05-18 DIAGNOSIS — Z8249 Family history of ischemic heart disease and other diseases of the circulatory system: Secondary | ICD-10-CM

## 2019-05-18 DIAGNOSIS — K219 Gastro-esophageal reflux disease without esophagitis: Secondary | ICD-10-CM | POA: Diagnosis present

## 2019-05-18 DIAGNOSIS — Z91040 Latex allergy status: Secondary | ICD-10-CM

## 2019-05-18 DIAGNOSIS — Z91041 Radiographic dye allergy status: Secondary | ICD-10-CM

## 2019-05-18 DIAGNOSIS — M47896 Other spondylosis, lumbar region: Secondary | ICD-10-CM | POA: Diagnosis present

## 2019-05-18 DIAGNOSIS — I209 Angina pectoris, unspecified: Secondary | ICD-10-CM

## 2019-05-18 DIAGNOSIS — M48061 Spinal stenosis, lumbar region without neurogenic claudication: Secondary | ICD-10-CM | POA: Diagnosis present

## 2019-05-18 DIAGNOSIS — I2510 Atherosclerotic heart disease of native coronary artery without angina pectoris: Secondary | ICD-10-CM | POA: Diagnosis present

## 2019-05-18 DIAGNOSIS — Z96653 Presence of artificial knee joint, bilateral: Secondary | ICD-10-CM | POA: Diagnosis present

## 2019-05-18 DIAGNOSIS — Z79899 Other long term (current) drug therapy: Secondary | ICD-10-CM

## 2019-05-18 DIAGNOSIS — M4156 Other secondary scoliosis, lumbar region: Secondary | ICD-10-CM | POA: Diagnosis present

## 2019-05-18 DIAGNOSIS — G8929 Other chronic pain: Secondary | ICD-10-CM | POA: Diagnosis present

## 2019-05-18 DIAGNOSIS — Z8711 Personal history of peptic ulcer disease: Secondary | ICD-10-CM | POA: Diagnosis not present

## 2019-05-18 DIAGNOSIS — Z833 Family history of diabetes mellitus: Secondary | ICD-10-CM | POA: Diagnosis not present

## 2019-05-18 DIAGNOSIS — Z823 Family history of stroke: Secondary | ICD-10-CM

## 2019-05-18 DIAGNOSIS — Z806 Family history of leukemia: Secondary | ICD-10-CM | POA: Diagnosis not present

## 2019-05-18 DIAGNOSIS — I10 Essential (primary) hypertension: Secondary | ICD-10-CM

## 2019-05-18 DIAGNOSIS — Z9049 Acquired absence of other specified parts of digestive tract: Secondary | ICD-10-CM | POA: Diagnosis not present

## 2019-05-18 DIAGNOSIS — Z803 Family history of malignant neoplasm of breast: Secondary | ICD-10-CM | POA: Diagnosis not present

## 2019-05-18 DIAGNOSIS — M5416 Radiculopathy, lumbar region: Secondary | ICD-10-CM | POA: Diagnosis present

## 2019-05-18 DIAGNOSIS — Z888 Allergy status to other drugs, medicaments and biological substances status: Secondary | ICD-10-CM | POA: Diagnosis not present

## 2019-05-18 SURGERY — LUMBAR LAMINECTOMY/DECOMPRESSION MICRODISCECTOMY 1 LEVEL
Anesthesia: General | Site: Back | Laterality: Right

## 2019-05-18 MED ORDER — PANTOPRAZOLE SODIUM 20 MG PO TBEC: 20.0000 mg | DELAYED_RELEASE_TABLET | Freq: Every day | ORAL | Status: DC

## 2019-05-18 MED ORDER — CHLORHEXIDINE GLUCONATE CLOTH 2 % EX PADS: 6.0000 | MEDICATED_PAD | Freq: Once | CUTANEOUS | Status: DC

## 2019-05-18 MED ORDER — MENTHOL 3 MG MT LOZG: 1.0000 | LOZENGE | OROMUCOSAL | Status: DC | PRN

## 2019-05-18 MED ORDER — ALUM & MAG HYDROXIDE-SIMETH 200-200-20 MG/5ML PO SUSP
30.0000 mL | Freq: Four times a day (QID) | ORAL | Status: DC | PRN
  Filled 2019-05-18: qty 30

## 2019-05-18 MED ORDER — ACETAMINOPHEN 500 MG PO TABS
1000.0000 mg | ORAL_TABLET | Freq: Once | ORAL | Status: AC
  Administered 2019-05-18: 12:00:00 1000 mg via ORAL

## 2019-05-18 MED ORDER — RAMIPRIL 2.5 MG PO CAPS
2.5000 mg | ORAL_CAPSULE | Freq: Every day | ORAL | Status: DC
  Administered 2019-05-18 – 2019-05-21 (×4): 2.5 mg via ORAL
  Filled 2019-05-18 (×5): qty 1

## 2019-05-18 MED ORDER — FUROSEMIDE 20 MG PO TABS
20.0000 mg | ORAL_TABLET | Freq: Every day | ORAL | Status: DC
  Filled 2019-05-18 (×3): qty 1

## 2019-05-18 MED ORDER — GABAPENTIN 300 MG PO CAPS
300.0000 mg | ORAL_CAPSULE | Freq: Three times a day (TID) | ORAL | Status: DC
  Administered 2019-05-18 – 2019-05-21 (×9): 300 mg via ORAL
  Filled 2019-05-18 (×9): qty 1

## 2019-05-18 MED ORDER — PHENOL 1.4 % MT LIQD: 1.0000 | OROMUCOSAL | Status: DC | PRN

## 2019-05-18 MED ORDER — ACETAMINOPHEN 500 MG PO TABS
ORAL_TABLET | ORAL | Status: AC
  Administered 2019-05-18: 1000 mg via ORAL
  Filled 2019-05-18: qty 2

## 2019-05-18 MED ORDER — ACETAMINOPHEN-CODEINE #3 300-30 MG PO TABS: 1.0000 | ORAL_TABLET | Freq: Three times a day (TID) | ORAL | Status: DC | PRN

## 2019-05-18 MED ORDER — ONDANSETRON HCL 4 MG PO TABS: 4.0000 mg | ORAL_TABLET | Freq: Four times a day (QID) | ORAL | Status: DC | PRN

## 2019-05-18 MED ORDER — LORATADINE 10 MG PO TABS
10.0000 mg | ORAL_TABLET | Freq: Every day | ORAL | Status: DC
  Administered 2019-05-19 – 2019-05-21 (×3): 10 mg via ORAL
  Filled 2019-05-18 (×3): qty 1

## 2019-05-18 MED ORDER — TRAMADOL HCL 50 MG PO TABS
100.0000 mg | ORAL_TABLET | Freq: Once | ORAL | Status: AC
  Administered 2019-05-18: 100 mg via ORAL

## 2019-05-18 MED ORDER — FENTANYL CITRATE (PF) 250 MCG/5ML IJ SOLN
INTRAMUSCULAR | Status: AC
  Filled 2019-05-18: qty 5

## 2019-05-18 MED ORDER — FESOTERODINE FUMARATE ER 4 MG PO TB24
4.0000 mg | ORAL_TABLET | Freq: Every day | ORAL | Status: DC
  Administered 2019-05-19 – 2019-05-21 (×3): 4 mg via ORAL
  Filled 2019-05-18 (×3): qty 1

## 2019-05-18 MED ORDER — CYCLOBENZAPRINE HCL 10 MG PO TABS: 10.0000 mg | ORAL_TABLET | Freq: Three times a day (TID) | ORAL | Status: DC | PRN

## 2019-05-18 MED ORDER — ONDANSETRON HCL 4 MG/2ML IJ SOLN: 4.0000 mg | Freq: Four times a day (QID) | INTRAMUSCULAR | Status: DC | PRN

## 2019-05-18 MED ORDER — PROPOFOL 10 MG/ML IV BOLUS
INTRAVENOUS | Status: AC
  Filled 2019-05-18: qty 20

## 2019-05-18 MED ORDER — CELECOXIB 200 MG PO CAPS
200.0000 mg | ORAL_CAPSULE | Freq: Every day | ORAL | Status: DC
  Administered 2019-05-19 – 2019-05-21 (×3): 200 mg via ORAL
  Filled 2019-05-18 (×3): qty 1

## 2019-05-18 MED ORDER — ACETAMINOPHEN 650 MG RE SUPP: 650.0000 mg | RECTAL | Status: DC | PRN

## 2019-05-18 MED ORDER — VITAMIN D (ERGOCALCIFEROL) 1.25 MG (50000 UNIT) PO CAPS
50000.0000 [IU] | ORAL_CAPSULE | ORAL | Status: DC
  Administered 2019-05-21: 50000 [IU] via ORAL
  Filled 2019-05-18: qty 1

## 2019-05-18 MED ORDER — DEXAMETHASONE SODIUM PHOSPHATE 10 MG/ML IJ SOLN
10.0000 mg | INTRAMUSCULAR | Status: DC
  Filled 2019-05-18: qty 1

## 2019-05-18 MED ORDER — HYDROMORPHONE HCL 1 MG/ML IJ SOLN
0.5000 mg | INTRAMUSCULAR | Status: DC | PRN
  Administered 2019-05-19 (×2): 0.5 mg via INTRAVENOUS
  Filled 2019-05-18 (×2): qty 0.5

## 2019-05-18 MED ORDER — SODIUM CHLORIDE 0.9% FLUSH
3.0000 mL | Freq: Two times a day (BID) | INTRAVENOUS | Status: DC
  Administered 2019-05-18 – 2019-05-20 (×5): 3 mL via INTRAVENOUS

## 2019-05-18 MED ORDER — SODIUM CHLORIDE 0.9 % IV SOLN: 250.0000 mL | INTRAVENOUS | Status: DC

## 2019-05-18 MED ORDER — ACETAMINOPHEN 325 MG PO TABS
650.0000 mg | ORAL_TABLET | ORAL | Status: DC | PRN
  Administered 2019-05-20: 650 mg via ORAL
  Filled 2019-05-18: qty 2

## 2019-05-18 MED ORDER — VANCOMYCIN HCL 10 G IV SOLR
1500.0000 mg | INTRAVENOUS | Status: AC
  Administered 2019-05-18: 1500 mg via INTRAVENOUS
  Filled 2019-05-18 (×2): qty 1500

## 2019-05-18 MED ORDER — SODIUM CHLORIDE 0.9% FLUSH: 3.0000 mL | INTRAVENOUS | Status: DC | PRN

## 2019-05-18 MED ORDER — PANTOPRAZOLE SODIUM 40 MG IV SOLR
40.0000 mg | Freq: Every day | INTRAVENOUS | Status: DC
  Administered 2019-05-18 – 2019-05-20 (×3): 40 mg via INTRAVENOUS
  Filled 2019-05-18 (×3): qty 40

## 2019-05-18 MED ORDER — ALPRAZOLAM 0.25 MG PO TABS: 0.2500 mg | ORAL_TABLET | Freq: Every day | ORAL | Status: DC | PRN

## 2019-05-18 MED ORDER — TRAMADOL HCL 50 MG PO TABS
100.0000 mg | ORAL_TABLET | Freq: Every day | ORAL | Status: DC
  Administered 2019-05-19 – 2019-05-21 (×3): 100 mg via ORAL
  Filled 2019-05-18 (×4): qty 2

## 2019-05-18 MED ORDER — OXYCODONE HCL 5 MG PO TABS
10.0000 mg | ORAL_TABLET | ORAL | Status: DC | PRN
  Administered 2019-05-19 – 2019-05-21 (×5): 10 mg via ORAL
  Filled 2019-05-18 (×6): qty 2

## 2019-05-18 SURGICAL SUPPLY — 51 items
BAG DECANTER FOR FLEXI CONT (MISCELLANEOUS) ×3 IMPLANT
BENZOIN TINCTURE PRP APPL 2/3 (GAUZE/BANDAGES/DRESSINGS) ×3 IMPLANT
BLADE CLIPPER SURG (BLADE) IMPLANT
BLADE SURG 11 STRL SS (BLADE) ×3 IMPLANT
BUR CUTTER 7.0 ROUND (BURR) ×3 IMPLANT
BUR MATCHSTICK NEURO 3.0 LAGG (BURR) ×3 IMPLANT
CANISTER SUCT 3000ML PPV (MISCELLANEOUS) ×3 IMPLANT
CARTRIDGE OIL MAESTRO DRILL (MISCELLANEOUS) ×1 IMPLANT
CLOSURE WOUND 1/2 X4 (GAUZE/BANDAGES/DRESSINGS) ×1
COVER WAND RF STERILE (DRAPES) ×3 IMPLANT
DECANTER SPIKE VIAL GLASS SM (MISCELLANEOUS) ×3 IMPLANT
DERMABOND ADVANCED (GAUZE/BANDAGES/DRESSINGS) ×2
DERMABOND ADVANCED .7 DNX12 (GAUZE/BANDAGES/DRESSINGS) ×1 IMPLANT
DIFFUSER DRILL AIR PNEUMATIC (MISCELLANEOUS) ×3 IMPLANT
DRAPE HALF SHEET 40X57 (DRAPES) IMPLANT
DRAPE LAPAROTOMY 100X72X124 (DRAPES) ×3 IMPLANT
DRAPE MICROSCOPE LEICA (MISCELLANEOUS) ×3 IMPLANT
DRAPE SURG 17X23 STRL (DRAPES) ×3 IMPLANT
DURAPREP 26ML APPLICATOR (WOUND CARE) ×3 IMPLANT
ELECT REM PT RETURN 9FT ADLT (ELECTROSURGICAL) ×3
ELECTRODE REM PT RTRN 9FT ADLT (ELECTROSURGICAL) ×1 IMPLANT
GAUZE 4X4 16PLY RFD (DISPOSABLE) IMPLANT
GAUZE SPONGE 4X4 12PLY STRL (GAUZE/BANDAGES/DRESSINGS) ×3 IMPLANT
GLOVE BIO SURGEON STRL SZ7 (GLOVE) IMPLANT
GLOVE BIO SURGEON STRL SZ8 (GLOVE) ×3 IMPLANT
GLOVE BIOGEL PI IND STRL 7.0 (GLOVE) IMPLANT
GLOVE BIOGEL PI INDICATOR 7.0 (GLOVE)
GLOVE EXAM NITRILE XL STR (GLOVE) IMPLANT
GLOVE INDICATOR 8.5 STRL (GLOVE) ×3 IMPLANT
GOWN STRL REUS W/ TWL LRG LVL3 (GOWN DISPOSABLE) ×1 IMPLANT
GOWN STRL REUS W/ TWL XL LVL3 (GOWN DISPOSABLE) ×2 IMPLANT
GOWN STRL REUS W/TWL 2XL LVL3 (GOWN DISPOSABLE) IMPLANT
GOWN STRL REUS W/TWL LRG LVL3 (GOWN DISPOSABLE) ×2
GOWN STRL REUS W/TWL XL LVL3 (GOWN DISPOSABLE) ×4
KIT BASIN OR (CUSTOM PROCEDURE TRAY) ×3 IMPLANT
KIT TURNOVER KIT B (KITS) ×3 IMPLANT
NEEDLE HYPO 22GX1.5 SAFETY (NEEDLE) ×3 IMPLANT
NEEDLE SPNL 22GX3.5 QUINCKE BK (NEEDLE) ×3 IMPLANT
NS IRRIG 1000ML POUR BTL (IV SOLUTION) ×3 IMPLANT
OIL CARTRIDGE MAESTRO DRILL (MISCELLANEOUS) ×3
PACK LAMINECTOMY NEURO (CUSTOM PROCEDURE TRAY) ×3 IMPLANT
RUBBERBAND STERILE (MISCELLANEOUS) ×6 IMPLANT
SPONGE SURGIFOAM ABS GEL SZ50 (HEMOSTASIS) ×3 IMPLANT
STRIP CLOSURE SKIN 1/2X4 (GAUZE/BANDAGES/DRESSINGS) ×2 IMPLANT
SUT VIC AB 0 CT1 18XCR BRD8 (SUTURE) ×1 IMPLANT
SUT VIC AB 0 CT1 8-18 (SUTURE) ×2
SUT VIC AB 2-0 CT1 18 (SUTURE) ×3 IMPLANT
SUT VICRYL 4-0 PS2 18IN ABS (SUTURE) ×3 IMPLANT
TOWEL GREEN STERILE (TOWEL DISPOSABLE) ×3 IMPLANT
TOWEL GREEN STERILE FF (TOWEL DISPOSABLE) ×3 IMPLANT
WATER STERILE IRR 1000ML POUR (IV SOLUTION) ×3 IMPLANT

## 2019-05-18 NOTE — H&P (Addendum)
Wendy Carrillo is an 78 y.o. female.   Chief Complaint: Back and right leg pain HPI: Patient is a very pleasant 78 year old female is a progressive worsening back and right L5 radicular pain posterior lateral thigh lateral shin and top of her foot.  Work-up revealed large disc radiation L4-5 on the right and due to patient progression of clinical syndrome imaging findings and failed conservative treatment I recommended laminectomy microdiscectomy at L4-5 on the right.  I extensively gone over the risks and benefits of the operation with her as well as perioperative course expectations of outcome and alternatives of surgery and she understands and agrees to proceed forward.  Past Medical History:  Diagnosis Date  . Arthritis    "all over"  . Chronic lower back pain   . Coronary artery calcification 10/21/2017  . Family history of adverse reaction to anesthesia    "one of my son's passed out"  . Gastritis   . GERD (gastroesophageal reflux disease)   . 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 History of blood transfusion 1992   "related to knee replacement"  . History of duodenal ulcer   . Hypertension   . PONV (postoperative nausea and vomiting)   . Spinal headache   . Spinal stenosis, lumbar     Past Surgical History:  Procedure Laterality Date  . ABDOMINAL HERNIA REPAIR    . ANTERIOR AND POSTERIOR VAGINAL REPAIR  2015  . APPENDECTOMY  ?1980's  . BLADDER SUSPENSION    . BREAST CYST EXCISION Bilateral   . BREAST SURGERY Left    "nipple taken off"  . CARDIAC CATHETERIZATION    . CARPAL TUNNEL RELEASE Bilateral 2010's  . CATARACT EXTRACTION W/ INTRAOCULAR LENS IMPLANT Left 2014  . CHOLECYSTECTOMY OPEN  1980's  . DILATION AND CURETTAGE OF UTERUS  "several"  . HERNIA REPAIR    . JOINT REPLACEMENT    . LEFT HEART CATH AND CORONARY ANGIOGRAPHY N/A 10/23/2017   Procedure: LEFT HEART CATH AND CORONARY ANGIOGRAPHY;  Surgeon:  Troy Sine, MD;  Location: [facility];  Service: Cardiovascular;  Laterality: N/A;  . LUMBAR LAMINECTOMY/DECOMPRESSION MICRODISCECTOMY Bilateral 06/09/2018   Procedure: Laminectomy and Foraminotomy - Lumbar Two,Lumbar Three - Lumbar One- Two - Lumbar Three-Four- bilateral;  Surgeon: Kary Kos, MD;  Location: [facility];  Service: Neurosurgery;  Laterality: Bilateral;  . PATELLA FRACTURE SURGERY Left "after 1992"   "after the replacement"  . REDUCTION MAMMAPLASTY Bilateral   . REPLACEMENT TOTAL KNEE Bilateral    "left in 1992; right in 2001"  . VAGINAL HYSTERECTOMY  1972    Family History  Problem Relation Age of Onset  . Hypertension Mother   . Stroke Mother   . Diabetes Mother   . Heart attack Father   . Breast cancer Sister   . Leukemia Brother   . Breast cancer Sister   . Breast cancer Sister   . Heart disease Brother    Social History:  reports that she has never smoked. She has never used smokeless tobacco. She reports that she does not drink alcohol or use drugs.  Allergies:  Allergies  Allergen Reactions  . Contrast Media [Iodinated Diagnostic Agents] Swelling, Rash and Other (See Comments)    Face, tongue  . Penicillins Rash and Other (See Comments)    PATIENT HAS HAD A PCN REACTION WITH IMMEDIATE RASH, FACIAL/TONGUE/THROAT SWELLING, SOB, OR LIGHTHEADEDNESS WITH HYPOTENSION:  #  #  YES  #  #  Has patient had a PCN reaction causing severe rash involving mucus membranes or skin necrosis: NO Has patient had a PCN reaction that required hospitalization NO Has patient had a PCN reaction occurring within the last 10 years: NO If all of the above answers are "NO", then may proceed with Cephalosporin use.  . Baclofen Itching  . Latex Rash  . Other Dermatitis and Other (See Comments)    Bandages caused redness     Medications Prior to Admission  Medication Sig Dispense Refill  . ALPRAZolam (XANAX) 0.25 MG tablet Take 1 tablet (0.25 mg total) by mouth daily as needed.  (Patient taking differently: Take 0.25 mg by mouth daily as needed for anxiety. ) 30 tablet 0  . celecoxib (CELEBREX) 200 MG capsule Take 200 mg by mouth daily.     . cetirizine (ZYRTEC) 10 MG tablet Take 10 mg by mouth daily.   5  . furosemide (LASIX) 20 MG tablet Take 20 mg by mouth daily.     Marland Kitchen gabapentin (NEURONTIN) 300 MG capsule Take 300 mg by mouth 3 (three) times daily.     . pantoprazole (PROTONIX) 20 MG tablet Take 1 tablet (20 mg total) by mouth daily. (Patient taking differently: Take 20 mg by mouth at bedtime. ) 30 tablet 0  . ramipril (ALTACE) 2.5 MG capsule Take 2.5 mg by mouth daily.  5  . tolterodine (DETROL LA) 4 MG 24 hr capsule Take 4 mg by mouth daily as needed for incontinence.    . traMADol (ULTRAM) 50 MG tablet Take 100 mg by mouth daily.   0  . Vitamin D, Ergocalciferol, (DRISDOL) 50000 UNITS CAPS capsule Take 50,000 Units by mouth every Thursday.   5  . acetaminophen-codeine (TYLENOL #3) 300-30 MG tablet Take 1 tablet by mouth every 8 (eight) hours as needed for moderate pain. (Patient not taking: Reported on 05/13/2019) 40 tablet 0  . HYDROcodone-acetaminophen (NORCO/VICODIN) 5-325 MG tablet 1 po q 6 hrs prn pain (Patient not taking: Reported on 05/13/2019) 30 tablet 0    No results found for this or any previous visit (from the past 48 hour(s)). No results found.  Review of Systems  Musculoskeletal: Positive for back pain.  Neurological: Positive for tingling and sensory change.    Blood pressure 128/74, pulse 64, temperature 98.2 F (36.8 C), temperature source Oral, resp. rate 18, height 5' 4.5" (1.638 m), weight 101.6 kg, SpO2 94 %. Physical Exam  Constitutional: She is oriented to person, place, and time. She appears well-developed and well-nourished.  HENT:  Head: Normocephalic.  Eyes: Pupils are equal, round, and reactive to light.  Neck: Normal range of motion.  Respiratory: Effort normal.  GI: Soft. Bowel sounds are normal.  Neurological: She is alert  and oriented to person, place, and time. She has normal strength. GCS eye subscore is 4. GCS verbal subscore is 5. GCS motor subscore is 6.  Strength is 5-5 iliopsoas, quads, hamstrings, gastrocs, into tibialis, EHL.  Skin: Skin is warm and dry.     Assessment/Plan 78 year old female presents for right-sided lumbar laminotomy microdiscectomy at L4-5  Addendum: Upon discussing her situation with her in the holding room this morning her symptomatology has changed she has had complete resolution of her right-sided radicular symptoms and now it is predominantly left side and seems to be L4-L5 distribution similar however the MRI scan that we have from last April does not show as significant pathology on the left as it did  on the right.  She did say this all began after the last round of epidurals that she had that were on the left at L2-3 and L3-4.  Because the symptomatology is changed she now is not having any symptoms on the right side I do not feel comfortable proceeding forward with surgery not knowing what is precipitated made the left side that much worse.  So I am admitting her for an emergent MRI scan is evening to see what changed as her pain is so unbearable she is not able to take care of her self at home.  Once we identify and look at her MRI scan to try to figure out source of this left-sided radicular symptoms then we could potentially reschedule operation addressing her symptomatic problem.  Reba Hulett P, MD 05/18/2019, 12:34 PM

## 2019-05-19 ENCOUNTER — Inpatient Hospital Stay (HOSPITAL_COMMUNITY): Payer: Medicare Other

## 2019-05-19 MED ORDER — GADOBUTROL 1 MMOL/ML IV SOLN
10.0000 mL | Freq: Once | INTRAVENOUS | Status: AC | PRN
  Administered 2019-05-19: 10 mL via INTRAVENOUS

## 2019-05-19 MED ORDER — DEXAMETHASONE SODIUM PHOSPHATE 4 MG/ML IJ SOLN
4.0000 mg | Freq: Four times a day (QID) | INTRAMUSCULAR | Status: DC
  Administered 2019-05-19 – 2019-05-21 (×9): 4 mg via INTRAVENOUS
  Filled 2019-05-19 (×8): qty 1

## 2019-05-19 NOTE — Progress Notes (Signed)
Subjective: Patient reports Well condition of back and left leg pain seems to be mostly L3-L4 distribution  Objective: Vital signs in last 24 hours: Temp:  [97.7 F (36.5 C)-98.7 F (37.1 C)] 98.2 F (36.8 C) (07/14 0748) Pulse Rate:  [64-73] 70 (07/14 0748) Resp:  [16-19] 18 (07/14 0748) BP: (111-129)/(53-76) 119/65 (07/14 0748) SpO2:  [94 %-100 %] 98 % (07/14 0748) Weight:  [101.6 kg] 101.6 kg (07/13 1203)  Intake/Output from previous day: No intake/output data recorded. Intake/Output this shift: No intake/output data recorded.  Strength 5-5  Lab Results: No results for input(s): WBC, HGB, HCT, PLT in the last 72 hours. BMET No results for input(s): NA, K, CL, CO2, GLUCOSE, BUN, CREATININE, CALCIUM in the last 72 hours.  Studies/Results: Mr Lumbar Spine W Wo Contrast  Result Date: 05/19/2019 CLINICAL DATA:  Back pain, new or progressive. Patient presents for surgery due to right radiculopathy, but there is now left-sided radicular symptoms. EXAM: MRI LUMBAR SPINE WITHOUT AND WITH CONTRAST TECHNIQUE: Multiplanar and multiecho pulse sequences of the lumbar spine were obtained without and with intravenous contrast. CONTRAST:  10 cc Gadavist intravenous COMPARISON:  02/24/2019 FINDINGS: Segmentation:  5 lumbar type vertebrae Alignment: Grade 1 anterolisthesis at L4-5. Mild retrolisthesis at T12-L1 to L2-3. Mild scoliotic curvature Vertebrae: Negative for fracture or aggressive bone lesion. Mild discogenic edema at T12-L1 and L1-2. Hemangioma in the L4 body Conus medullaris and cauda equina: Conus extends to the L1-2 level. Conus and cauda equina appear normal. Paraspinal and other soft tissues: Postoperative scarring after decompression at L2 to L4. right renal cystic intensity. Disc levels: T12- L1: Disc narrowing asymmetric to the right with bulge and endplate ridging. Moderate right foraminal stenosis. L1-L2: Disc narrowing and bulging with endplate ridging. Posterior element  hypertrophy. Combined with dorsal epidural fat there is moderate effacement of the thecal sac, progressed. Moderate bilateral foraminal narrowing, borderline advanced L2-L3: Disc narrowing and bulging with retrolisthesis. There is advanced posterior element hypertrophy. Mild-to-moderate spinal stenosis. Advanced left foraminal stenosis, more moderate on the right L3-L4: Disc narrowing and bulging asymmetric to the left. Advanced posterior element hypertrophy. Patent spinal canal after decompression. Left more than right foraminal impingement, advanced on the left L4-L5: Advanced facet arthropathy with spurring and anterolisthesis. The disc is narrowed and bulging. Mild-to-moderate bilateral foraminal stenosis. Mild spinal stenosis. L5-S1:Greatest level of degenerative disc narrowing with disc bulging and endplate ridging. There is posterior element hypertrophy. Moderate bilateral foraminal narrowing. Patent spinal canal IMPRESSION: 1. 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 No focal left-sided changes since February 24, 2019. 2. 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 Thecal sac narrowing is due to degenerative disease and dorsal epidural fat. 3. L2-3 and L3-4 left more than right foraminal impingement, advanced on the left. 4. Moderate foraminal narrowing at L5-S1. At least moderate foraminal narrowing at T12-L1 and L1-2. 5. L2-3 to L4-5 patent spinal canal after remote laminectomy. Electronically Signed   By: Monte Fantasia M.D.   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  We will start IV steroids get a CT scan of her lumbar spine.  LOS: 1 day     Wendy Carrillo P 05/19/2019, 7:51 AM

## 2019-05-19 NOTE — Evaluation (Signed)
Occupational Therapy Evaluation Patient Details Name: Wendy Carrillo MRN: [id number] DOB: 1941-02-22 Today's Date: 05/19/2019    History of Present Illness Pt is a 78 y/o female who initially presented for laminectomy microdiscectomy at L4-5 on the right. Upon current presentation pt now reporting predominantly L side symptoms from L4-5 distribution. MRI completed and shows progressive foraminal stenosis of the L3 and L4 nerve root on the left. Now planning for follow-up CT of lumbar spine to determine appropriate medical course. PMHx includes hx bil TKA, bil CTR, cardiac catheterization, previous lumbar surgery 06/2018.   Clinical Impression   This 78 y/o female presents with the above. PTA pt reports independence with ADL and mobility. Pt performing functional mobility in room without AD and overall minguard assist. She currently requires minguard assist for toileting and standing grooming ADL, up to [place] for LB ADL secondary to maintaining back precautions at this time. Educated pt in back precautions safety and compensatory techniques for performing ADL and functional transfers currently for pain management as well as in precursor to possibility of surgery during this admission. She will benefit from continued acute OT services and currently recommend follow up [facility] services, however will continue to follow for progress and to further assess for discharge needs PRN.     Follow Up Recommendations  Home health OT;Supervision/Assistance - 24 hour(to be further assessed)    Equipment Recommendations  Other (comment)(to be further assessed)    Recommendations for Other Services PT consult     Precautions / Restrictions Precautions Precautions: Fall;Back Precaution Comments: assumed back precautions for pain at this time, reviewed precautions as precursor to potentially having surgery Required Braces or Orthoses: ("no brace needed" per order set) Restrictions Weight Bearing Restrictions:  No      Mobility Bed Mobility Overal bed mobility: Needs Assistance Bed Mobility: Rolling;Sidelying to Sit;Sit to Supine Rolling: Min guard Sidelying to sit: Min guard   Sit to supine: Min guard   General bed mobility comments: educated pt in log roll technique; pt performing proper technique to transition to sitting with minguard, cues for log roll; educated in how to return to supine via log roll however pt intiating return to supine without doing so; increased time/effort throughout  Transfers Overall transfer level: Needs assistance Equipment used: None Transfers: Sit to/from Stand Sit to Stand: Min guard         General transfer comment: for safety and balance    Balance Overall balance assessment: Needs assistance Sitting-balance support: Feet supported Sitting balance-Leahy Scale: Good     Standing balance support: No upper extremity supported;During functional activity Standing balance-Leahy Scale: Fair                             ADL either performed or assessed with clinical judgement   ADL Overall ADL's : Needs assistance/impaired Eating/Feeding: Modified independent;Sitting   Grooming: Wash/dry hands;Supervision/safety;Min guard;Standing   Upper Body Bathing: Min guard;Sitting   Lower Body Bathing: Moderate assistance;Sit to/from stand   Upper Body Dressing : Set up;Min guard;Sitting   Lower Body Dressing: Moderate assistance;Sit to/from stand Lower Body Dressing Details (indicate cue type and reason): due to difficulty reaching LEs while maintaining back precautions Toilet Transfer: Min guard;Ambulation   Toileting- Clothing Manipulation and Hygiene: Min guard;Sit to/from stand       Functional mobility during ADLs: Min guard General ADL Comments: educated pt in back precautions currently as pain management but aslo as pre-op education in the instance  that pt to go for sx     Vision         Perception     Praxis       Pertinent Vitals/Pain Pain Assessment: Faces Faces Pain Scale: Hurts a little bit Pain Location: back Pain Descriptors / Indicators: Discomfort;Guarding Pain Intervention(s): Limited activity within patient's tolerance;Monitored during session;Premedicated before session     Hand Dominance Right   Extremity/Trunk Assessment Upper Extremity Assessment Upper Extremity Assessment: Overall WFL for tasks assessed   Lower Extremity Assessment Lower Extremity Assessment: Overall WFL for tasks assessed       Communication Communication Communication: No difficulties   Cognition Arousal/Alertness: Awake/alert Behavior During Therapy: WFL for tasks assessed/performed Overall Cognitive Status: Within Functional Limits for tasks assessed                                     General Comments       Exercises     Shoulder Instructions      Home Living Family/patient expects to be discharged to:: Private residence Living Arrangements: Spouse/significant other Available Help at Discharge: Family;Available PRN/intermittently Type of Home: House Home Access: Stairs to enter [employer] of Steps: 1   Home Layout: One level     Bathroom Shower/Tub: Corporate investment banker: Standard         Additional Comments: per chart review pt with RW and tub bench, will need to confirm; reports spouse cannot physically assist      Prior Functioning/Environment Level of Independence: Independent                 OT Problem List: Decreased strength;Decreased activity tolerance;Decreased knowledge of precautions;Pain;Impaired balance (sitting and/or standing)      OT Treatment/Interventions: Self-care/ADL training;Therapeutic exercise;Neuromuscular education;DME and/or AE instruction;Therapeutic activities;Patient/family education;Balance training    OT Goals(Current goals can be found in the care plan section) Acute Rehab OT  Goals Patient Stated Goal: none stated today, agreeable to working with therapy OT Goal Formulation: With patient Time For Goal Achievement: 06/02/19 Potential to Achieve Goals: Good  OT Frequency: Min 2X/week   Barriers to D/C:            Co-evaluation              AM-PAC OT "6 Clicks" Daily Activity     Outcome Measure Help from another person eating meals?: None Help from another person taking care of personal grooming?: A Little Help from another person toileting, which includes using toliet, bedpan, or urinal?: A Little Help from another person bathing (including washing, rinsing, drying)?: A Little Help from another person to put on and taking off regular upper body clothing?: None Help from another person to put on and taking off regular lower body clothing?: A Lot 6 Click Score: 19   End of Session Nurse Communication: Mobility status  Activity Tolerance: Patient tolerated treatment well Patient left: in bed;with call bell/phone within reach;with family/visitor present  OT Visit Diagnosis: Other abnormalities of gait and mobility (R26.89);Pain Pain - part of body: (back)                Time: 1020-1038 OT Time Calculation (min): 18 min Charges:  OT General Charges $OT Visit: 1 Visit OT Evaluation $OT Eval Moderate Complexity: 1 Mod  Lou Cal, OT [employer] Pager [phone] Office [phone]   Raymondo Band 05/19/2019, 11:50 AM

## 2019-05-19 NOTE — Progress Notes (Deleted)
Pt doing well. Pt given D/C instructions with verbal understanding. Rx's were sent to Pt's pharmacy by MD. Pt's incision is clean and dry with no sign of infection. Pt's IV was removed prior to D/C. Pt D/C'd home via wheelchair per MD order. Pt is stable @ D/C and has no other needs at this time. Holli Humbles, RN

## 2019-05-20 MED ORDER — BISACODYL 5 MG PO TBEC
5.0000 mg | DELAYED_RELEASE_TABLET | Freq: Every day | ORAL | Status: DC | PRN
  Administered 2019-05-20: 5 mg via ORAL
  Filled 2019-05-20: qty 1

## 2019-05-20 NOTE — Progress Notes (Signed)
Subjective: Patient reports Left hip and leg pain improved from yesterday on IV steroids  Objective: Vital signs in last 24 hours: Temp:  [98 F (36.7 C)-98.7 F (37.1 C)] 98.3 F (36.8 C) (07/15 0750) Pulse Rate:  [67-88] 85 (07/15 0750) Resp:  [18-19] 19 (07/15 0750) BP: (114-136)/(60-87) 136/81 (07/15 0750) SpO2:  [96 %-98 %] 97 % (07/15 0750)  Intake/Output from previous day: No intake/output data recorded. Intake/Output this shift: No intake/output data recorded.  Strength 5-5 wound clean dry intact  Lab Results: No results for input(s): WBC, HGB, HCT, PLT in the last 72 hours. BMET No results for input(s): NA, K, CL, CO2, GLUCOSE, BUN, CREATININE, CALCIUM in the last 72 hours.  Studies/Results: Ct Lumbar Spine Wo Contrast  Result Date: 05/19/2019 CLINICAL DATA:  Back pain, progressive EXAM: CT LUMBAR SPINE WITHOUT CONTRAST TECHNIQUE: Multidetector CT imaging of the lumbar spine was performed without intravenous contrast administration. Multiplanar CT image reconstructions were also generated. COMPARISON:  Lumbar MRI from yesterday FINDINGS: Segmentation: 5 lumbar type vertebrae Alignment: Dextroscoliosis with rightward translation at L3-4. Mild retrolisthesis at T12-L1, L1-2 and L2-3. Grade 1 anterolisthesis at L4-5 Vertebrae: Negative for fracture, endplate erosion, or aggressive bone lesion. Paraspinal and other soft tissues: Right renal cystic intensity. Contrast is being excreted from the kidneys from uncertain procedure. Disc levels: T12- L1: Advanced degenerative disc collapse with endplate ridging. There is posterior element hypertrophy. Foraminal stenosis that is advanced on the right and more moderate on the left. L1-L2: Advanced facet arthropathy with spurring and retrolisthesis. The disc is narrowed and there is endplate ridging. Advanced Biforaminal impingement. Moderate spinal stenosis L2-L3: Disc narrowing and bulging. Posterior element hypertrophy. A spur obliterates  the left foramen. More moderate narrowing is seen at the right foramen. Patent canal after laminectomy L3-L4: Disc narrowing and bulging. Facet degeneration with bulky spurring on the right more than left. Right more than left bony foraminal impingement. Patent canal after laminectomy L4-L5: Advanced degenerative facet spurring with moderate bilateral foraminal narrowing. The disc is circumferentially bulging L5-S1:Advanced disc degeneration with collapse of the disc space and endplate spurring. The facets are also spurred. Foraminal impingement that is advanced on the right and more moderate on the left. IMPRESSION: 1. 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 2. Diffuse high-grade foraminal impingement with upgrading of foraminal stenosis at L1-2 and T12-L1 when compared with MRI yesterday, as above. 3. L1-2 moderate spinal stenosis better depicted on prior MRI. Electronically Signed   By: Monte Fantasia M.D.   On: 05/19/2019 10:03   Mr Lumbar Spine W Wo Contrast  Result Date: 05/19/2019 CLINICAL DATA:  Back pain, new or progressive. Patient presents for surgery due to right radiculopathy, but there is now left-sided radicular symptoms. EXAM: MRI LUMBAR SPINE WITHOUT AND WITH CONTRAST TECHNIQUE: Multiplanar and multiecho pulse sequences of the lumbar spine were obtained without and with intravenous contrast. CONTRAST:  10 cc Gadavist intravenous COMPARISON:  02/24/2019 FINDINGS: Segmentation:  5 lumbar type vertebrae Alignment: Grade 1 anterolisthesis at L4-5. Mild retrolisthesis at T12-L1 to L2-3. Mild scoliotic curvature Vertebrae: Negative for fracture or aggressive bone lesion. Mild discogenic edema at T12-L1 and L1-2. Hemangioma in the L4 body Conus medullaris and cauda equina: Conus extends to the L1-2 level. Conus and cauda equina appear normal. Paraspinal and other soft tissues: Postoperative scarring after decompression at L2 to L4. right renal cystic intensity. Disc  levels: T12- L1: Disc narrowing asymmetric to the right with bulge and endplate ridging. Moderate right foraminal stenosis. L1-L2:  Disc narrowing and bulging with endplate ridging. Posterior element hypertrophy. Combined with dorsal epidural fat there is moderate effacement of the thecal sac, progressed. Moderate bilateral foraminal narrowing, borderline advanced L2-L3: Disc narrowing and bulging with retrolisthesis. There is advanced posterior element hypertrophy. Mild-to-moderate spinal stenosis. Advanced left foraminal stenosis, more moderate on the right L3-L4: Disc narrowing and bulging asymmetric to the left. Advanced posterior element hypertrophy. Patent spinal canal after decompression. Left more than right foraminal impingement, advanced on the left L4-L5: Advanced facet arthropathy with spurring and anterolisthesis. The disc is narrowed and bulging. Mild-to-moderate bilateral foraminal stenosis. Mild spinal stenosis. L5-S1:Greatest level of degenerative disc narrowing with disc bulging and endplate ridging. There is posterior element hypertrophy. Moderate bilateral foraminal narrowing. Patent spinal canal IMPRESSION: 1. 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 No focal left-sided changes since February 24, 2019. 2. 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 Thecal sac narrowing is due to degenerative disease and dorsal epidural fat. 3. L2-3 and L3-4 left more than right foraminal impingement, advanced on the left. 4. Moderate foraminal narrowing at L5-S1. At least moderate foraminal narrowing at T12-L1 and L1-2. 5. L2-3 to L4-5 patent spinal canal after remote laminectomy. Electronically Signed   By: Monte Fantasia M.D.   On: 05/19/2019 04:19    Assessment/Plan: Patient with significant provement on IV steroids CT scan does show significant degenerative arthropathy scoliosis spondylosis and stenosis.  However for predominance of left leg symptoms  that she has consistent with both L2-L3 and L4 radiculopathies the fix I think would require a multilevel lumbar fusion.  I am not ready to recommend that to her I do not think that is in her best interest.  I extensively explained this to her and her daughter were cannot continue to mobilize her with physical therapy continue the IV steroids and plan discharge home  LOS: 2 days     Fermon Ureta P 05/20/2019, 10:35 AM

## 2019-05-20 NOTE — Evaluation (Signed)
Physical Therapy Evaluation Patient Details Name: Wendy Carrillo MRN: [id number] DOB: 12-13-1940 Today's Date: 05/20/2019   History of Present Illness  Pt is a 78 y/o female who initially presented for laminectomy microdiscectomy at L4-5 on the right. Upon current presentation pt now reporting predominantly L side symptoms from L4-5 distribution. MRI completed and shows progressive foraminal stenosis of the L3 and L4 nerve root on the left. Now planning for follow-up CT of lumbar spine to determine appropriate medical course. PMHx includes hx bil TKA, bil CTR, cardiac catheterization, previous lumbar surgery 06/2018. Per MD notes, plan to continue steroids and do not plan on performing surgery at this point.   Clinical Impression  Pt admitted secondary to problem above with deficits below. Pt requiring min guard and HHA for gait this session. Educated about walking program, back precautions, and use of RW at home to increase safety. Feel pt would benefit from HHPT at d/c. Will continue to follow acutely to maximize functional mobility independence and safety.     Follow Up Recommendations Home health PT;Supervision for mobility/OOB    Equipment Recommendations  None recommended by PT    Recommendations for Other Services       Precautions / Restrictions Precautions Precautions: Back;Fall Precaution Comments: Educated about back precautions to help with pain management. Pt able to recall 2/3 back precautions.  Restrictions Weight Bearing Restrictions: No      Mobility  Bed Mobility               General bed mobility comments: Up in hallway with daughter upon entry.   Transfers Overall transfer level: Needs assistance Equipment used: 1 person hand held assist Transfers: Sit to/from Stand Sit to Stand: Min guard         General transfer comment: standing in hall with daughter upon entry. Min guard for safety to return to sitting. Cues for hand placement.    Ambulation/Gait Ambulation/Gait assistance: Min guard Gait Distance (Feet): 150 Feet Assistive device: 1 person hand held assist Gait Pattern/deviations: Step-through pattern;Decreased step length - right;Decreased step length - left;Antalgic;Decreased weight shift to left Gait velocity: Decreased    General Gait Details: Slow, antalgic gait secondary to LLE pain. Functional weakness noted in LLE during gait, however, did not note overt LOB. Discussed using RW at home to increase safety and to help with pain management. Educated about generalized walking program to perform at home.   Stairs            Wheelchair Mobility    Modified Rankin (Stroke Patients Only)       Balance Overall balance assessment: Needs assistance Sitting-balance support: Feet supported;No upper extremity supported Sitting balance-Leahy Scale: Good     Standing balance support: Single extremity supported;During functional activity Standing balance-Leahy Scale: Fair                               Pertinent Vitals/Pain Pain Assessment: Faces Faces Pain Scale: Hurts little more Pain Location: LLE  Pain Descriptors / Indicators: Discomfort;Guarding Pain Intervention(s): Monitored during session;Limited activity within patient's tolerance;Repositioned    Home Living Family/patient expects to be discharged to:: Private residence Living Arrangements: Spouse/significant other Available Help at Discharge: Family;Available PRN/intermittently Type of Home: House Home Access: Stairs to enter Entrance Stairs-Rails: None Entrance Stairs-Number of Steps: 1 Home Layout: One level Home Equipment: Walker - 2 wheels;Tub bench;Cane - single point Additional Comments: Reports granddaughter may be coming to stay with her  at d/c     Prior Function Level of Independence: Independent               Hand Dominance        Extremity/Trunk Assessment   Upper Extremity Assessment Upper  Extremity Assessment: Defer to OT evaluation    Lower Extremity Assessment Lower Extremity Assessment: LLE deficits/detail LLE Deficits / Details: Functional weakness noted and pt reporting pain into thigh.     Cervical / Trunk Assessment Cervical / Trunk Assessment: Other exceptions Cervical / Trunk Exceptions: back pain with hx of surgery  Communication   Communication: No difficulties  Cognition Arousal/Alertness: Awake/alert Behavior During Therapy: WFL for tasks assessed/performed Overall Cognitive Status: Within Functional Limits for tasks assessed                                        General Comments General comments (skin integrity, edema, etc.): Pt's daughter in room throughout session     Exercises     Assessment/Plan    PT Assessment Patient needs continued PT services  PT Problem List Decreased strength;Decreased balance;Decreased mobility;Decreased knowledge of use of DME;Pain       PT Treatment Interventions DME instruction;Gait training;Stair training;Therapeutic activities;Functional mobility training;Therapeutic exercise;Balance training;Patient/family education    PT Goals (Current goals can be found in the Care Plan section)  Acute Rehab PT Goals Patient Stated Goal: to feel better PT Goal Formulation: With patient Time For Goal Achievement: 06/03/19 Potential to Achieve Goals: Good    Frequency Min 3X/week   Barriers to discharge        Co-evaluation               AM-PAC PT "6 Clicks" Mobility  Outcome Measure Help needed turning from your back to your side while in a flat bed without using bedrails?: A Little Help needed moving from lying on your back to sitting on the side of a flat bed without using bedrails?: A Little Help needed moving to and from a bed to a chair (including a wheelchair)?: A Little Help needed standing up from a chair using your arms (e.g., wheelchair or bedside chair)?: A Little Help needed to  walk in hospital room?: A Little Help needed climbing 3-5 steps with a railing? : Total 6 Click Score: 16    End of Session   Activity Tolerance: Patient tolerated treatment well Patient left: in chair;with call bell/phone within reach;with family/visitor present Nurse Communication: Mobility status PT Visit Diagnosis: Unsteadiness on feet (R26.81);Muscle weakness (generalized) (M62.81);Pain Pain - Right/Left: Left Pain - part of body: Leg(back )    Time: 7482-7078 PT Time Calculation (min) (ACUTE ONLY): 15 min   Charges:   PT Evaluation $PT Eval Low Complexity: [place], PT, DPT  Acute Rehabilitation Services  Pager: [phone] Office: [phone]   Rudean Hitt 05/20/2019, 4:49 PM

## 2019-05-21 NOTE — Progress Notes (Addendum)
Occupational Therapy Treatment Patient Details Name: Wendy Carrillo MRN: [id number] DOB: 07-Jan-1941 Today's Date: 05/21/2019    History of present illness Pt is a 78 y/o female who initially presented for laminectomy microdiscectomy at L4-5 on the right. Upon current presentation pt now reporting predominantly L side symptoms from L4-5 distribution. MRI completed and shows progressive foraminal stenosis of the L3 and L4 nerve root on the left. Now planning for follow-up CT of lumbar spine to determine appropriate medical course. PMHx includes hx bil TKA, bil CTR, cardiac catheterization, previous lumbar surgery 06/2018. Per MD notes, plan to continue steroids and do not plan on performing surgery at this point.    OT comments  Pt presents supine in bed eager to participate in therapy sessions in prep for discharge home. Pt dressed and reports having completed her morning ADL with daughter assist. Further reviewed back precautions, safety and compensatory techniques for completing ADL and functional transfers while maintaining precautions. Pt reports daughter provided cues intermittently during completion of ADL this AM to adhere to precautions, and pt requiring min cues during this session to maintain. Pt return demonstrating understanding of using AE for LB ADL with min cues. Questions answered throughout. Will continue per POC.   Follow Up Recommendations  Home health OT;Supervision/Assistance - 24 hour(HH aide)    Equipment Recommendations  None recommended by OT          Precautions / Restrictions Precautions Precautions: Back;Fall Precaution Comments: Educated about back precautions to help with pain management. Pt able to recall 3/3 back precautions.  Restrictions Weight Bearing Restrictions: No       Mobility Bed Mobility Overal bed mobility: Needs Assistance Bed Mobility: Sidelying to Sit   Sidelying to sit: Min guard       General bed mobility comments: pt quickly  transitioning to sitting upon OT entering room, use of modified log roll technique noted   Transfers                      Balance Overall balance assessment: Needs assistance Sitting-balance support: Feet supported;No upper extremity supported Sitting balance-Leahy Scale: Good     Standing balance support: Single extremity supported;During functional activity Standing balance-Leahy Scale: Fair                             ADL either performed or assessed with clinical judgement   ADL Overall ADL's : Needs assistance/impaired               Lower Body Bathing Details (indicate cue type and reason): reviewed safety and recommendation to perform from sit<>stand level vs standing alone     Lower Body Dressing: Minimal assistance;Sit to/from stand;Cueing for back precautions;With adaptive equipment Lower Body Dressing Details (indicate cue type and reason): reviewed use of reacher to assist with LB dressing; pt able to return demonstrate with min cues to adhere to precautions              Functional mobility during ADLs: Min guard General ADL Comments: reviewed back precautions, AE and compensatory techniques for performing; pt dressed upon arrival and reports has completed her morning ADL     Vision       Perception     Praxis      Cognition Arousal/Alertness: Awake/alert Behavior During Therapy: WFL for tasks assessed/performed Overall Cognitive Status: Within Functional Limits for tasks assessed  Exercises     Shoulder Instructions       General Comments Pt's daugther present throughout session    Pertinent Vitals/ Pain       Pain Assessment: Faces Faces Pain Scale: No hurt Pain Intervention(s): Monitored during session  Home Living                                          Prior Functioning/Environment              Frequency  Min 2X/week         Progress Toward Goals  OT Goals(current goals can now be found in the care plan section)  Progress towards OT goals: Progressing toward goals  Acute Rehab OT Goals Patient Stated Goal: to feel better OT Goal Formulation: With patient Time For Goal Achievement: 06/02/19 Potential to Achieve Goals: Good ADL Goals Pt Will Perform Grooming: with modified independence;standing Pt Will Perform Lower Body Dressing: with supervision;sit to/from stand;with adaptive equipment Pt Will Transfer to Toilet: with modified independence;ambulating Pt Will Perform Toileting - Clothing Manipulation and hygiene: with modified independence;sit to/from stand Pt Will Perform Tub/Shower Transfer: Shower transfer;with supervision;ambulating;shower seat;grab bars  Plan Discharge plan remains appropriate    Co-evaluation                 AM-PAC OT "6 Clicks" Daily Activity     Outcome Measure   Help from another person eating meals?: None Help from another person taking care of personal grooming?: A Little Help from another person toileting, which includes using toliet, bedpan, or urinal?: A Little Help from another person bathing (including washing, rinsing, drying)?: A Little Help from another person to put on and taking off regular upper body clothing?: None Help from another person to put on and taking off regular lower body clothing?: A Lot 6 Click Score: 19    End of Session    OT Visit Diagnosis: Other abnormalities of gait and mobility (R26.89);Pain Pain - part of body: (back)   Activity Tolerance Patient tolerated treatment well   Patient Left with call bell/phone within reach;Other (comment)(sitting EOB with PT)   Nurse Communication Mobility status        Time: 7494-4967 OT Time Calculation (min): 11 min  Charges: OT General Charges $OT Visit: 1 Visit OT Treatments $Self Care/Home Management : 8-22 mins  Lou Cal, OT Supplemental Rehabilitation Services Pager  [phone] Office [phone]    Raymondo Band 05/21/2019, 11:10 AM

## 2019-05-21 NOTE — Progress Notes (Signed)
Pt doing well. Pt and daughter given D/C instructions with verbal understanding. Rx was sent to pharmacy by MD. Pt's IV was removed prior to D/C. Pt D/C'd home via wheelchair per MD order. Pt is stable @ D/C and has no other needs at this time. Holli Humbles, RN

## 2019-05-21 NOTE — Progress Notes (Signed)
Physical Therapy Treatment Patient Details Name: Wendy Carrillo MRN: [id number] DOB: 1941-08-04 Today's Date: 05/21/2019    History of Present Illness Pt is a 78 y/o female who initially presented for laminectomy microdiscectomy at L4-5 on the right. Upon current presentation pt now reporting predominantly L side symptoms from L4-5 distribution. MRI completed and shows progressive foraminal stenosis of the L3 and L4 nerve root on the left. Now planning for follow-up CT of lumbar spine to determine appropriate medical course. PMHx includes hx bil TKA, bil CTR, cardiac catheterization, previous lumbar surgery 06/2018. Per MD notes, plan to continue steroids and do not plan on performing surgery at this point.     PT Comments    Pt progressing towards physical therapy goals.  Overall moving slow but steady without an AD. Pt has a cane and RW available to use at home if needbe. Daughter present but will not be available at d/c for support, so continue to recommend home health services to assist pt upon d/c. Reinforced education regarding car transfer, activity progression, and precautions.    Follow Up Recommendations  Home health PT;Supervision for mobility/OOB     Equipment Recommendations  None recommended by PT    Recommendations for Other Services       Precautions / Restrictions Precautions Precautions: Back;Fall Precaution Comments: Educated about back precautions to help with pain management. Pt able to recall 3/3 back precautions.  Required Braces or Orthoses: ("no brace needed" per order set) Restrictions Weight Bearing Restrictions: No    Mobility  Bed Mobility Overal bed mobility: Needs Assistance Bed Mobility: Sidelying to Sit   Sidelying to sit: Min guard       General bed mobility comments: Pt was received sitting EOB with OT  Transfers Overall transfer level: Needs assistance Equipment used: None Transfers: Sit to/from Stand Sit to Stand: Min guard          General transfer comment: Close guard for safety as pt pwoered up to full stand. Increased time required however no assist needed.   Ambulation/Gait Ambulation/Gait assistance: Min guard Gait Distance (Feet): 200 Feet Assistive device: None Gait Pattern/deviations: Step-through pattern;Decreased step length - right;Decreased step length - left;Antalgic;Decreased weight shift to left Gait velocity: Decreased  Gait velocity interpretation: <1.8 ft/sec, indicate of risk for recurrent falls General Gait Details: Slow, antalgic gait secondary to LLE pain. Functional weakness noted in LLE during gait, however, did not note overt LOB. Discussed using RW at home to increase safety and to help with pain management. Educated about generalized walking program to perform at home.    Stairs Stairs: (Pt declined stair training)           Wheelchair Mobility    Modified Rankin (Stroke Patients Only)       Balance Overall balance assessment: Needs assistance Sitting-balance support: Feet supported;No upper extremity supported Sitting balance-Leahy Scale: Good     Standing balance support: Single extremity supported;During functional activity Standing balance-Leahy Scale: Fair                              Cognition Arousal/Alertness: Awake/alert Behavior During Therapy: WFL for tasks assessed/performed Overall Cognitive Status: Within Functional Limits for tasks assessed                                        Exercises  General Comments General comments (skin integrity, edema, etc.): Pt's daugther present throughout session      Pertinent Vitals/Pain Pain Assessment: Faces Faces Pain Scale: Hurts a little bit Pain Location: Incision site Pain Descriptors / Indicators: Discomfort;Operative site guarding Pain Intervention(s): Limited activity within patient's tolerance;Monitored during session;Repositioned    Home Living                       Prior Function            PT Goals (current goals can now be found in the care plan section) Acute Rehab PT Goals Patient Stated Goal: to feel better PT Goal Formulation: With patient Time For Goal Achievement: 06/03/19 Potential to Achieve Goals: Good Progress towards PT goals: Progressing toward goals    Frequency    Min 3X/week      PT Plan Current plan remains appropriate    Co-evaluation              AM-PAC PT "6 Clicks" Mobility   Outcome Measure  Help needed turning from your back to your side while in a flat bed without using bedrails?: A Little Help needed moving from lying on your back to sitting on the side of a flat bed without using bedrails?: A Little Help needed moving to and from a bed to a chair (including a wheelchair)?: A Little Help needed standing up from a chair using your arms (e.g., wheelchair or bedside chair)?: A Little Help needed to walk in hospital room?: A Little Help needed climbing 3-5 steps with a railing? : Total 6 Click Score: 16    End of Session Equipment Utilized During Treatment: Gait belt Activity Tolerance: Patient tolerated treatment well Patient left: in chair;with call bell/phone within reach;with family/visitor present Nurse Communication: Mobility status PT Visit Diagnosis: Unsteadiness on feet (R26.81);Muscle weakness (generalized) (M62.81);Pain Pain - Right/Left: Left Pain - part of body: Leg(back )     Time: 2330-0762 PT Time Calculation (min) (ACUTE ONLY): 21 min  Charges:  $Gait Training: 8-22 mins                     Rolinda Roan, PT, DPT Acute Rehabilitation Services Pager: [phone] Office: [phone]    Thelma Comp 05/21/2019, 12:41 PM

## 2019-05-21 NOTE — Discharge Summary (Signed)
Physician Discharge Summary  Patient ID: Wendy Carrillo MRN: [id number] DOB/AGE: 08-Mar-1941 78 y.o. Estimated body mass index is 37.86 kg/m as calculated from the following:   Height as of this encounter: 5' 4.5" (1.638 m).   Weight as of this encounter: 101.6 kg.   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  She had no ipsilateral symptoms.  Patient was admitted was worked up extensively with MRI scan CT scan and placed on IV steroids.  Patient symptoms got significantly improved CT scan and MRI scan showed significant degenerative scoliosis and spondylosis and foraminal stenosis bilaterally.  Due to the patient symptomatic improvement and due to the surgical solution being an extensive lumbar fusion we elected not to proceed forward surgical correction for her new onset left leg pain.  So she will go home with steroids pain medication and follow-up in 1 to 2 weeks with home health and physical therapy.  Consults: Significant Diagnostic Studies: Treatments: Discharge Exam: Blood pressure 124/74, pulse 72, temperature 97.9 F (36.6 C), temperature source Oral, resp. rate 19, height 5' 4.5" (1.638 m), weight 101.6 kg, SpO2 97 %. Strength 5/5 wound clean dry and intact  Disposition: Home  Discharge Instructions    Face-to-face encounter (required for Medicare/Medicaid patients)   Complete by: As directed    I Clarissa Laird P certify that this patient is under my care and that I, or a nurse practitioner or physician's assistant working with me, had a face-to-face encounter that meets the physician face-to-face encounter requirements with this patient on  05/21/2019. The encounter with the patient was in whole, or in part for the following medical condition(s) which is the primary reason for home health care (List medical condition): Lumbar stenosis and scoliosis   The encounter with the patient was in whole, or in part, for the following medical condition, which is the primary reason for home health care: Lumbar stenosis and scoliosis   I certify that, based on my findings, the following services are medically necessary home health services:  Physical therapy Nursing     Reason for Medically Necessary Home Health Services: Skilled Nursing- Skilled Assessment/Observation   My clinical findings support the need for the above services: Unable to leave home safely without assistance and/or assistive device   Further, I certify that my clinical findings support that this patient is homebound due to: Unable to leave home safely without assistance   Home Health   Complete by: As directed    To provide the following care/treatments:  PT Home Health Aide OT       Allergies as of 05/21/2019      Reactions   Contrast Media [iodinated Diagnostic Agents] Swelling, Rash, Other (See Comments)   Face, tongue   Penicillins Rash, Other (See Comments)   PATIENT HAS HAD A PCN REACTION WITH IMMEDIATE RASH, FACIAL/TONGUE/THROAT SWELLING, SOB, OR LIGHTHEADEDNESS WITH HYPOTENSION:  #  #  YES  #  #  Has patient had a PCN reaction causing severe rash involving mucus membranes or skin necrosis: NO Has patient had a PCN reaction that required hospitalization NO Has patient had a PCN reaction occurring within the last 10 years: NO If all of the above answers are "NO", then may  proceed with Cephalosporin use.   Baclofen Itching   Latex Rash   Other Dermatitis, Other (See Comments)   Bandages caused redness       Medication List    TAKE these medications   acetaminophen-codeine 300-30 MG tablet Commonly known as: TYLENOL #3 Take 1 tablet by mouth every 8 (eight)  hours as needed for moderate pain.   ALPRAZolam 0.25 MG tablet Commonly known as: XANAX Take 1 tablet (0.25 mg total) by mouth daily as needed. What changed: reasons to take this   celecoxib 200 MG capsule Commonly known as: CELEBREX Take 200 mg by mouth daily.   cetirizine 10 MG tablet Commonly known as: ZYRTEC Take 10 mg by mouth daily.   furosemide 20 MG tablet Commonly known as: LASIX Take 20 mg by mouth daily.   gabapentin 300 MG capsule Commonly known as: NEURONTIN Take 300 mg by mouth 3 (three) times daily.   HYDROcodone-acetaminophen 5-325 MG tablet Commonly known as: NORCO/VICODIN 1 po q 6 hrs prn pain   pantoprazole 20 MG tablet Commonly known as: PROTONIX Take 1 tablet (20 mg total) by mouth daily. What changed: when to take this   ramipril 2.5 MG capsule Commonly known as: ALTACE Take 2.5 mg by mouth daily.   tolterodine 4 MG 24 hr capsule Commonly known as: DETROL LA Take 4 mg by mouth daily as needed for incontinence.   traMADol 50 MG tablet Commonly known as: ULTRAM Take 100 mg by mouth daily.   Vitamin D (Ergocalciferol) 1.25 MG (50000 UT) Caps capsule Commonly known as: DRISDOL Take 50,000 Units by mouth every Thursday.        Signed: Forrest Jaroszewski P 05/21/2019, 7:59 AM

## 2019-05-21 NOTE — TOC Initial Note (Addendum)
Transition of Care [facility]) - Initial/Assessment Note    Patient Details  Name: Wendy Carrillo MRN: [id number] Date of Birth: 1941/07/20  [facility]) CM/SW Contact:    Benard Halsted, LCSW Phone Number: 05/21/2019, 10:01 AM  Clinical Narrative:                 10am-[name] received consult for possible home health services at time of discharge. [name] spoke with patient and daughter regarding PT recommendation of Home Health PT at time of discharge. Patient reported that she would like home health services. Patient was declined by [facility], [facility], [facility], [facility], and [facility]. [facility] believes they can accept patient. [name] faxed referral for review. [name] provided Medicare SNF ratings list. [name] confirmed PCP and address with patient. Patient's daughter present for transportation. No further questions reported at this time. [name] to continue to follow and assist with discharge planning needs.  10:29am-Patient has been accepted by [facility] and is able to discharge home today.    Expected Discharge Plan: [place] Barriers to Discharge: No Barriers Identified   Patient Goals and CMS Choice Patient states their goals for this hospitalization and ongoing recovery are:: Rehab CMS Medicare.gov Compare Post Acute Care list provided to:: Patient Choice offered to / list presented to : Patient, Adult Children  Expected Discharge Plan and Services Expected Discharge Plan: [place] In-house Referral: NA Discharge Planning Services: CM Consult Post Acute Care Choice: [place] arrangements for the past 2 months: Single Family Home Expected Discharge Date: 05/21/19               DME Arranged: (Rn contacting [employer] for equipment needs)         HH Arranged: PT, OT, Nurse's Aide HH Agency: [place] Date [place]: 05/21/19 Time [place] Contacted: 1000    Prior Living  Arrangements/Services Living arrangements for the past 2 months: Single Family Home Lives with:: Self, Adult Children Patient language and need for interpreter reviewed:: Yes Do you feel safe going back to the place where you live?: Yes      Need for Family Participation in Patient Care: No (Comment) Care giver support system in place?: Yes (comment)   Criminal Activity/Legal Involvement Pertinent to Current Situation/Hospitalization: No - Comment as needed  Activities of Daily Living      Permission Sought/Granted Permission sought to share information with : Family Supports Permission granted to share information with : Yes, Verbal Permission Granted  Share Information with NAME: Djuana  Permission granted to share info w AGENCY: [place] granted to share info w Relationship: Daughter  Permission granted to share info w Contact Information: [phone]  Emotional Assessment Appearance:: Appears stated age Attitude/Demeanor/Rapport: Gracious Affect (typically observed): Accepting, Appropriate Orientation: : Oriented to Self, Oriented to Place, Oriented to  Time, Oriented to Situation Alcohol / Substance Use: Not Applicable Psych Involvement: No (comment)  Admission diagnosis:  Stenosis Patient Active Problem List   Diagnosis Date Noted  . Spinal stenosis at L4-L5 level 05/18/2019  . Benign essential HTN   . History of total bilateral knee replacement (TKR)   . Acute blood loss anemia   . Post-operative pain   . Spinal stenosis of lumbar region 06/09/2018  . Spinal stenosis, lumbar   . Hypertension   . History of duodenal ulcer   . Hepatitis   . Gastritis   . Family history of adverse  reaction to anesthesia   . Chronic lower back pain   . Arthritis   . Coronary artery calcification 10/21/2017  . Combined forms of age-related cataract of right eye 04/18/2017  . Status post total bilateral knee replacement 06/08/2016  . Left lumbar radiculopathy  05/09/2016  . Bilateral shoulder pain 03/22/2016  . DDD (degenerative disc disease), cervical 03/22/2016  . Morbid obesity with BMI of 40.0-44.9, adult ([place]) 03/08/2016  . Spondylolisthesis, 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/2016  . Neck pain 03/08/2016  . Lumbar stenosis 03/08/2016  . Cervical spinal stenosis 03/08/2016  . Back pain 11/16/2015  . 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/10/2015  . Angina pectoris ([place]) 04/20/2015  . Morbid obesity ([place]) 04/20/2015  . Dyspnea 04/20/2015  . Atypical chest pain 04/20/2015  . Esophageal reflux 02/23/2015  . Essential hypertension 02/23/2015  . Chronic back pain 02/23/2015  . Chest pain 02/22/2015  . Abdominal tenderness, LLQ (left lower quadrant) 06/15/2014  . Acute sinusitis 06/15/2014  . Allergic rhinitis due to pollen 06/15/2014  . Bladder disorder 06/15/2014  . Epistaxis 06/15/2014  . Female stress incontinence 06/15/2014  . 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 Insomnia 06/15/2014  . Localized primary osteoarthritis of wrist 06/15/2014  . Lumbosacral spondylosis 06/15/2014  . Osteoarthrosis, localized, primary, hand 06/15/2014  . Referred otalgia 06/15/2014  . Tingling 06/15/2014  . Tinnitus 06/15/2014  . Urinary incontinence 06/15/2014  . Cystocele 05/04/2014  . Retention of urine 05/04/2014  . Cystocele, lateral 04/12/2014  . Rectocele 03/17/2014  . History of blood transfusion 11/05/1990   PCP:  Jilda Panda, MD Pharmacy:   [id number] - [place], [place] [address]. AT [address] [place] [address] Phone: [phone] Fax: [phone]     Social Determinants of Health (SDOH) Interventions    Readmission Risk Interventions No flowsheet data found.

## 2019-07-17 ENCOUNTER — Other Ambulatory Visit: Payer: Self-pay | Admitting: Neurosurgery

## 2019-07-17 ENCOUNTER — Telehealth: Payer: Self-pay

## 2019-07-17 DIAGNOSIS — M48062 Spinal stenosis, lumbar region with neurogenic claudication: Secondary | ICD-10-CM

## 2019-07-17 NOTE — Telephone Encounter (Signed)
Phone call to patient to review instructions for 13 hr prep for Injection w/ contrast on 07/22/2019  at 1130am. Prescription called [facility] [address], per pts request. Pt aware and verbalized understanding of instructions. Prescription: 07/21/19 1030 pm- 50mg  Prednisone 07/22/19 430 am - 50mg  Prednisone 07/22/19 1030 am - 50mg  Prednisone and 50mg  Benadryl

## 2019-07-22 ENCOUNTER — Ambulatory Visit
Admission: RE | Admit: 2019-07-22 | Discharge: 2019-07-22 | Disposition: A | Discharge status: Home / Self Care | Payer: Medicare Other | Source: Ambulatory Visit | Attending: Neurosurgery | Admitting: Neurosurgery

## 2019-07-22 ENCOUNTER — Other Ambulatory Visit: Payer: Self-pay

## 2019-07-22 DIAGNOSIS — M48062 Spinal stenosis, lumbar region with neurogenic claudication: Secondary | ICD-10-CM

## 2019-07-22 MED ORDER — IOPAMIDOL (ISOVUE-M 200) INJECTION 41%
1.0000 mL | Freq: Once | INTRAMUSCULAR | Status: AC
  Administered 2019-07-22: 1 mL via EPIDURAL

## 2019-07-22 MED ORDER — METHYLPREDNISOLONE ACETATE 40 MG/ML INJ SUSP (RADIOLOG
120.0000 mg | Freq: Once | INTRAMUSCULAR | Status: AC
  Administered 2019-07-22: 120 mg via EPIDURAL

## 2019-07-22 NOTE — Discharge Instructions (Signed)

## 2019-08-14 ENCOUNTER — Other Ambulatory Visit: Payer: Self-pay | Admitting: Neurosurgery

## 2019-08-14 DIAGNOSIS — M5416 Radiculopathy, lumbar region: Secondary | ICD-10-CM

## 2019-08-17 ENCOUNTER — Telehealth: Payer: Self-pay

## 2019-08-17 NOTE — Telephone Encounter (Signed)
I spoke with patient after leaving a message on her drug store in epic's voice mail, to let her know I've called in her 13-hr prep for her procedure tomorrow.  Prednisone 50mg  PO 10/12 @ 2230, 10/13 @ 0430 and 1030.  Benadryl 50mg  PO 10/13 @ 1030.  Patient aware and voiced no questions.

## 2019-08-18 ENCOUNTER — Ambulatory Visit
Admission: RE | Admit: 2019-08-18 | Discharge: 2019-08-18 | Disposition: A | Discharge status: Home / Self Care | Payer: Medicare Other | Source: Ambulatory Visit | Attending: Neurosurgery | Admitting: Neurosurgery

## 2019-08-18 DIAGNOSIS — M5416 Radiculopathy, lumbar region: Secondary | ICD-10-CM

## 2019-08-18 MED ORDER — METHYLPREDNISOLONE ACETATE 40 MG/ML INJ SUSP (RADIOLOG
120.0000 mg | Freq: Once | INTRAMUSCULAR | Status: AC
  Administered 2019-08-18: 12:00:00 120 mg via EPIDURAL

## 2019-08-18 MED ORDER — IOPAMIDOL (ISOVUE-M 200) INJECTION 41%
1.0000 mL | Freq: Once | INTRAMUSCULAR | Status: AC
  Administered 2019-08-18: 1 mL via EPIDURAL

## 2019-08-18 NOTE — Discharge Instructions (Signed)

## 2019-09-27 IMAGING — DX DG FEMUR 2+V*L*
4 series · 4 of 4 positions shown · non-contrast
Comparison: None.

CLINICAL DATA: Pain for 1 week

EXAM:
LEFT FEMUR 2 VIEWS

[femur ap (1 of 2)]
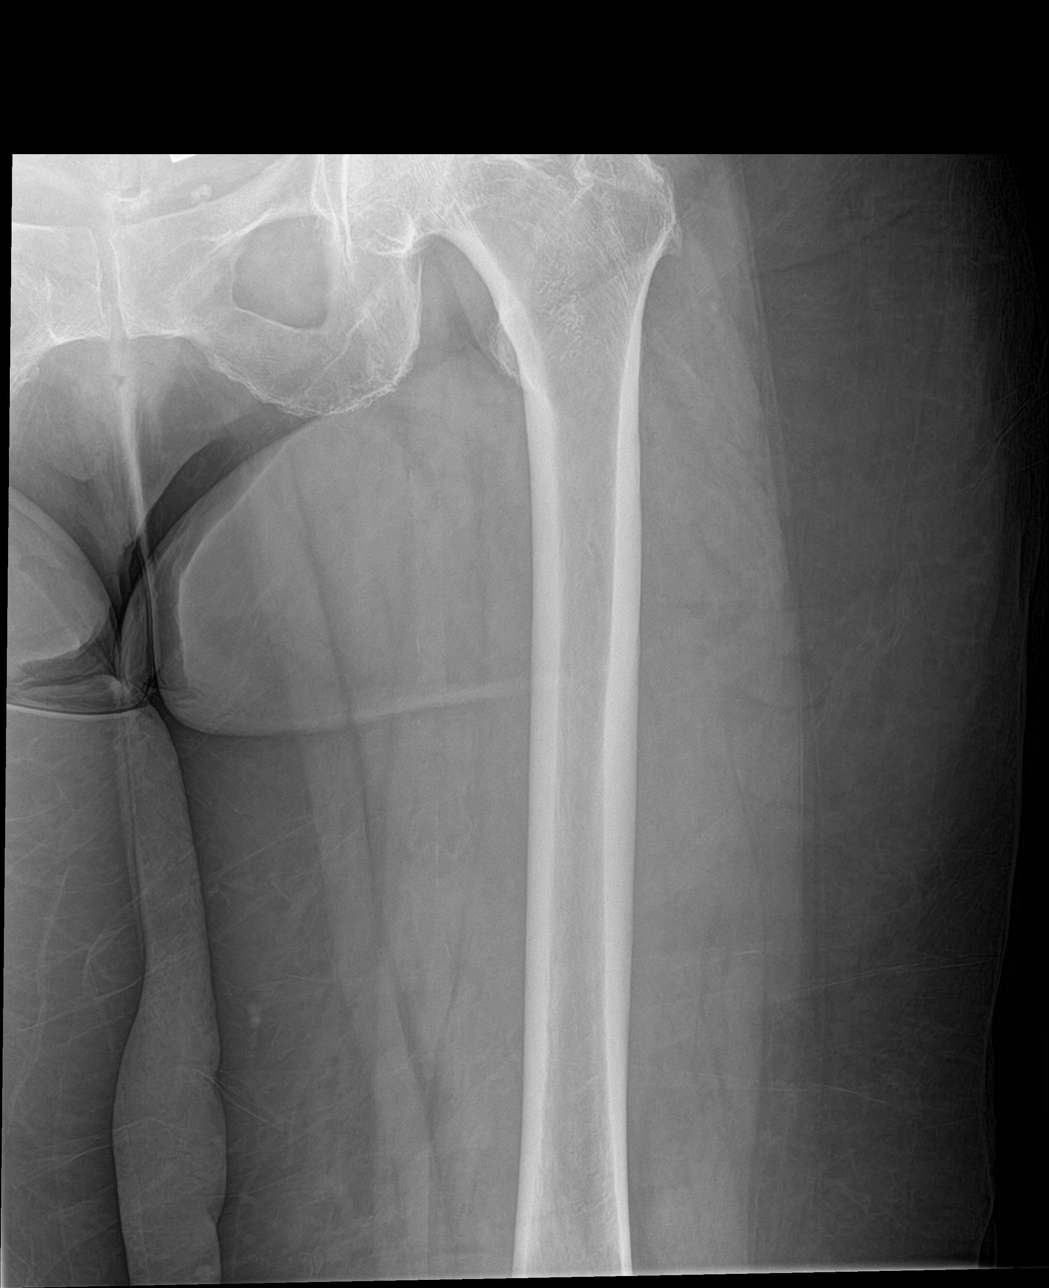

[femur lat (1 of 2)]
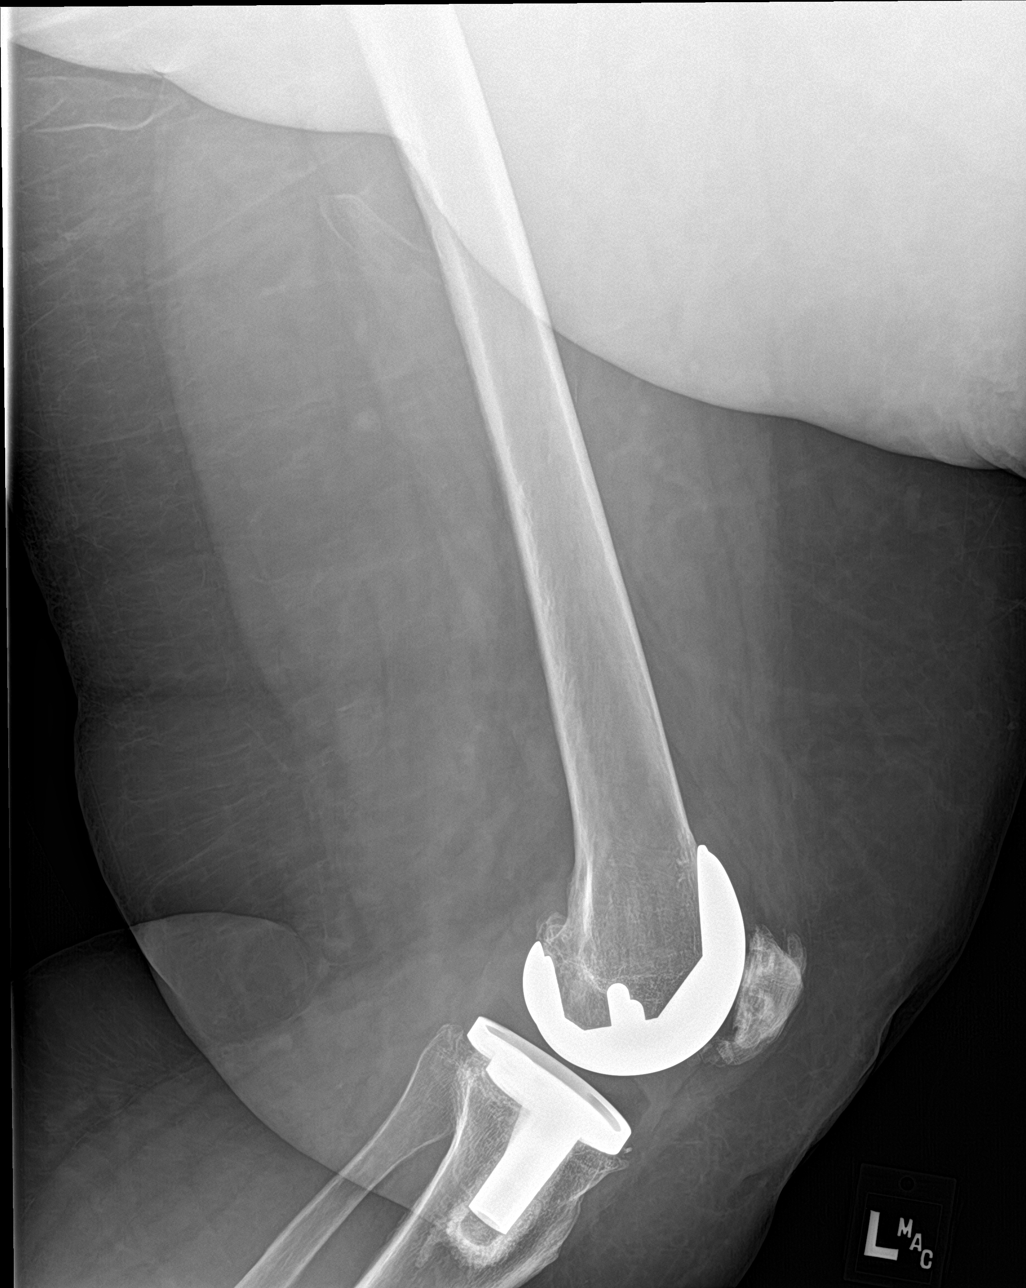

[femur lat (2 of 2)]
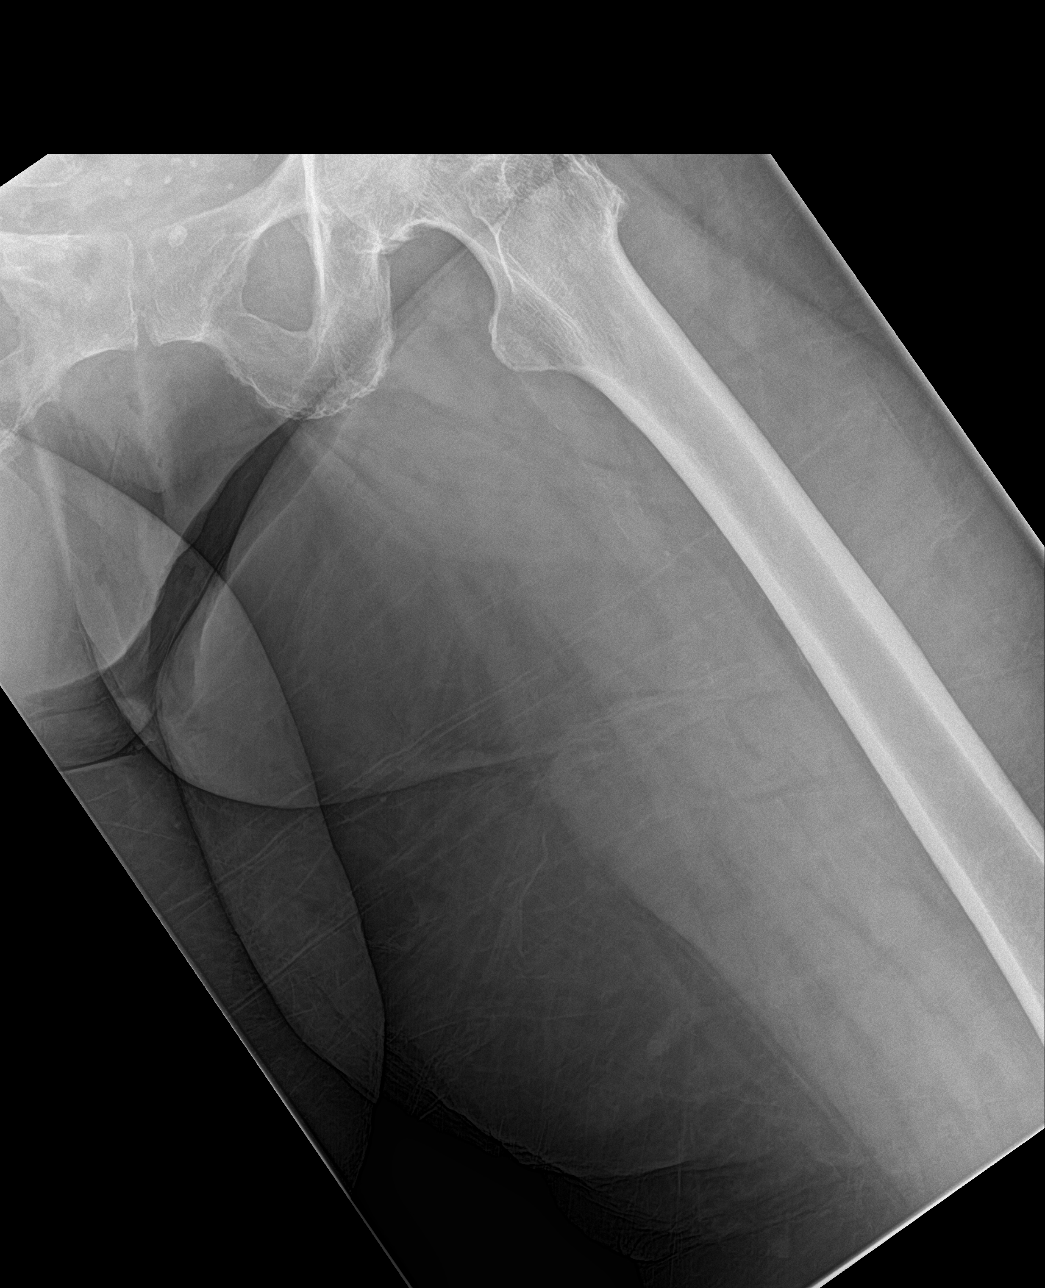

[femur ap (2 of 2)]
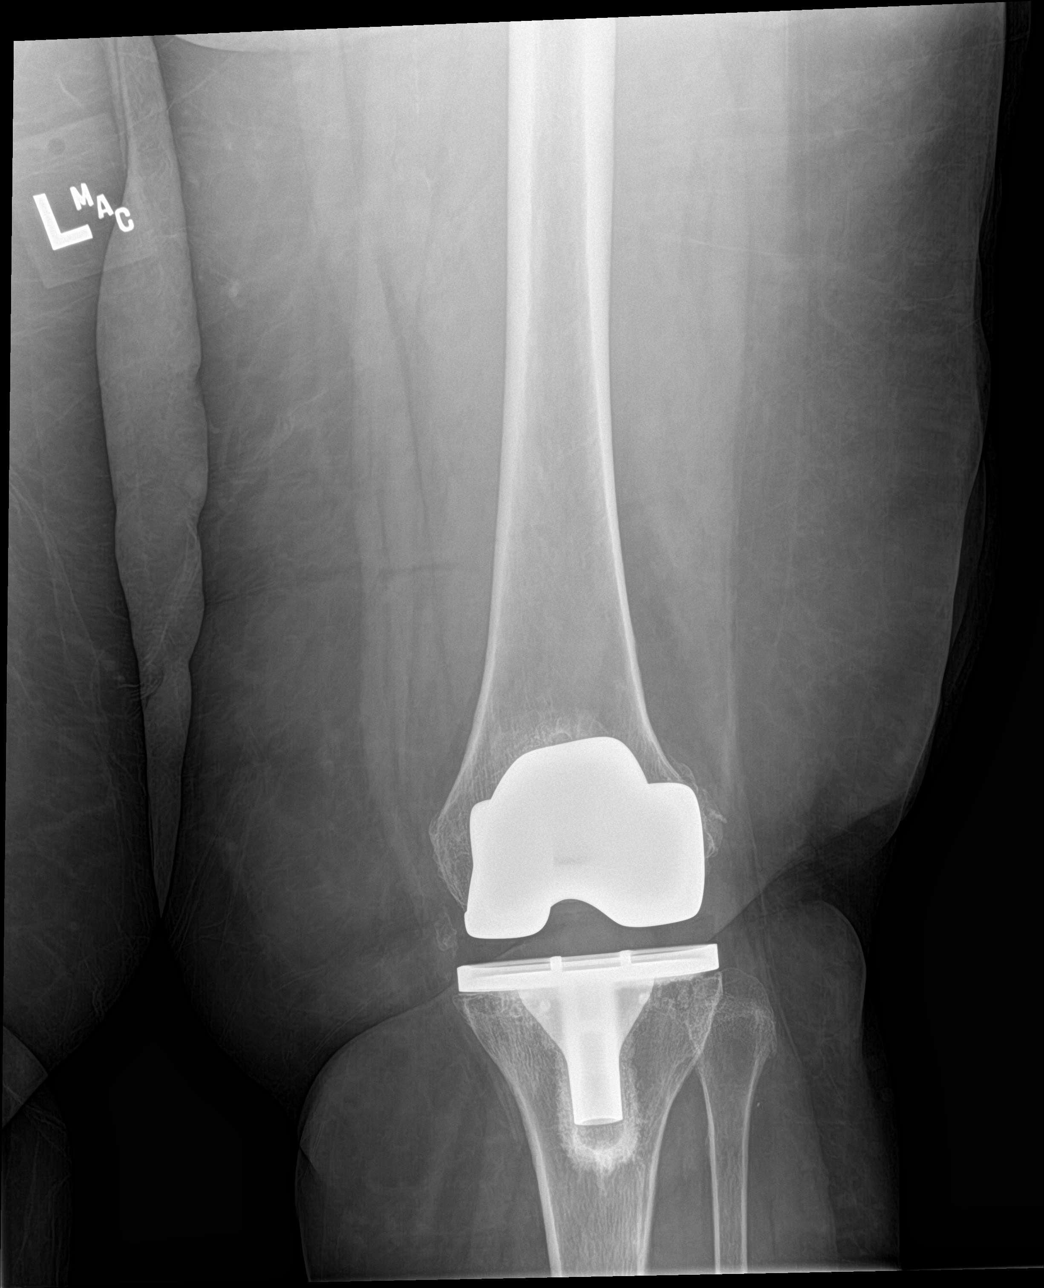

[4 of 4 positions shown; findings below may reference images not displayed]

FINDINGS: No acute displaced fracture or malalignment. Status post left knee
replacement with intact hardware. Vascular calcifications within the
soft tissues.
IMPRESSION: No acute osseous abnormality.  Status post left knee replacement.

## 2019-10-19 ENCOUNTER — Ambulatory Visit: Payer: Self-pay

## 2019-10-19 ENCOUNTER — Ambulatory Visit: Payer: Medicare Other | Admitting: Orthopedic Surgery

## 2019-10-19 ENCOUNTER — Other Ambulatory Visit: Payer: Self-pay

## 2019-10-19 DIAGNOSIS — G5621 Lesion of ulnar nerve, right upper limb: Secondary | ICD-10-CM

## 2019-10-19 DIAGNOSIS — M25531 Pain in right wrist: Secondary | ICD-10-CM

## 2019-10-22 NOTE — Progress Notes (Signed)
Office Visit Note   Patient: Wendy Carrillo           Date of Birth: [birth year]           MRN: [id number] Visit Date: 10/19/2019 Requested by: Jilda Panda, MD [id number] [place],  [place] [address] PCP: Jilda Panda, MD  Subjective: Chief Complaint  Patient presents with  . Right Wrist - Pain    HPI: Wendy Carrillo is a 78 y.o. female who presents to the office complaining of right hand/wrist pain.  She notes pain over the last month since she fell.  She localizes pain to the radial aspect of her right wrist as well as the ulnar aspect of her right hand.  She has been taking tramadol, Tylenol, gabapentin with some relief.  However she notes no significant improvement over the last 4 weeks.  She does note some weakness of the hand but states that she has had decreased strength in his hand since a heart catheterization 2 years ago.  She notes difficulty writing and decreased grip strength.  She denies any numbness or tingling.  She has a history of bilateral carpal tunnel release.. Denies any neck pain or radicular symptoms.              ROS:  All systems reviewed are negative as they relate to the chief complaint within the history of present illness.  Patient denies fevers or chills.  Assessment & Plan: Visit Diagnoses:  1. Ulnar neuropathy of right upper extremity     Plan: Patient is a 78 year old female who presents complaining of right hand pain.  She has right wrist and hand pain that she localizes to the radial aspect of the right wrist and to the midshaft fifth metacarpal.  However she has no findings on x-ray that are significant for fracture or dislocation.  She does have findings of ulnar neuropathy with wasting of the interosseous muscles and weakness on exam compared with the contralateral hand.  Ordered nerve conduction study of the right arm to evaluate for ulnar neuropathy.  Patient will follow up with the office to review results.  No evidence of radiculopathy as  the cause of the symptoms.  No subluxation of the ulnar nerve at the right elbow.  Follow-Up Instructions: No follow-ups on file.   Orders:  Orders Placed This Encounter  Procedures  . XR Wrist Complete Right  . Ambulatory referral to Physical Medicine Rehab   No orders of the defined types were placed in this encounter.     Procedures: No procedures performed   Clinical Data: No additional findings.  Objective: Vital Signs: There were no vitals taken for this visit.  Physical Exam:  Constitutional: Patient appears well-developed HEENT:  Head: Normocephalic Eyes:EOM are normal Neck: Normal range of motion Cardiovascular: Normal rate Pulmonary/chest: Effort normal Neurologic: Patient is alert Skin: Skin is warm Psychiatric: Patient has normal mood and affect  Ortho Exam:  Right hand exam Tenderness to palpation over the radial styloid.  No significant tenderness in the anatomic snuffbox or the central/dorsal aspect of the wrist.  No tenderness through the ulnar aspect of the wrist.  Tenderness over the midshaft of the fifth metacarpal.  No significant pain with thumb circumduction.  Patient is unable to fully adductor her small finger with the rest of her fingers.  Weakness of the interosseous muscles with abduction.  Wasting of the interosseous muscles of the right hand compared with the contralateral side.  Positive Froment's sign.  Specialty  Comments:  No specialty comments available.  Imaging: No results found.   PMFS History: Patient Active Problem List   Diagnosis Date Noted  . Spinal stenosis at L4-L5 level 05/18/2019  . Benign essential HTN   . History of total bilateral knee replacement (TKR)   . Acute blood loss anemia   . Post-operative pain   . Spinal stenosis of lumbar region 06/09/2018  . Spinal stenosis, lumbar   . Hypertension   . History of duodenal ulcer   . Hepatitis   . Gastritis   . Family history of adverse reaction to anesthesia   .  Chronic lower back pain   . Arthritis   . Coronary artery calcification 10/21/2017  . Combined forms of age-related cataract of right eye 04/18/2017  . Status post total bilateral knee replacement 06/08/2016  . Left lumbar radiculopathy 05/09/2016  . Bilateral shoulder pain 03/22/2016  . DDD (degenerative disc disease), cervical 03/22/2016  . Morbid obesity with BMI of 40.0-44.9, adult ([place]) 03/08/2016  . Spondylolisthesis, 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/2016  . Neck pain 03/08/2016  . Lumbar stenosis 03/08/2016  . Cervical spinal stenosis 03/08/2016  . Back pain 11/16/2015  . 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/10/2015  . Angina pectoris ([place]) 04/20/2015  . Morbid obesity ([place]) 04/20/2015  . Dyspnea 04/20/2015  . Atypical chest pain 04/20/2015  . Esophageal reflux 02/23/2015  . Essential hypertension 02/23/2015  . Chronic back pain 02/23/2015  . Chest pain 02/22/2015  . Abdominal tenderness, LLQ (left lower quadrant) 06/15/2014  . Acute sinusitis 06/15/2014  . Allergic rhinitis due to pollen 06/15/2014  . Bladder disorder 06/15/2014  . Epistaxis 06/15/2014  . Female stress incontinence 06/15/2014  . 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 Insomnia 06/15/2014  . Localized primary osteoarthritis of wrist 06/15/2014  . Lumbosacral spondylosis 06/15/2014  . Osteoarthrosis, localized, primary, hand 06/15/2014  . Referred otalgia 06/15/2014  . Tingling 06/15/2014  . Tinnitus 06/15/2014  . Urinary incontinence 06/15/2014  . Cystocele 05/04/2014  . Retention of urine 05/04/2014  . Cystocele, lateral 04/12/2014  . Rectocele 03/17/2014  . History of blood transfusion 11/05/1990   Past Medical History:  Diagnosis Date  . Arthritis    "all over"  . Chronic lower back pain   . Coronary artery calcification 10/21/2017  . Family history of adverse reaction to anesthesia    "one of my son's passed out"  . Gastritis   . GERD (gastroesophageal reflux disease)   . 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 History of blood transfusion 1992   "related to knee replacement"  . History of duodenal ulcer   . Hypertension   . PONV (postoperative nausea and vomiting)   . Spinal headache   . Spinal stenosis, lumbar     Family History  Problem Relation Age of Onset  . Hypertension Mother   . Stroke Mother   . Diabetes Mother   . Heart attack Father   . Breast cancer Sister   . Leukemia Brother   . Breast cancer Sister   . Breast cancer Sister   . Heart disease Brother     Past Surgical History:  Procedure Laterality Date  . ABDOMINAL HERNIA REPAIR    . ANTERIOR AND POSTERIOR VAGINAL REPAIR  2015  . APPENDECTOMY  ?1980's  . BLADDER SUSPENSION    . BREAST CYST EXCISION Bilateral   . BREAST SURGERY Left    "nipple taken off"  . CARDIAC  CATHETERIZATION    . CARPAL TUNNEL RELEASE Bilateral 2010's  . CATARACT EXTRACTION W/ INTRAOCULAR LENS IMPLANT Left 2014  . CHOLECYSTECTOMY OPEN  1980's  . DILATION AND CURETTAGE OF UTERUS  "several"  . HERNIA REPAIR    . JOINT REPLACEMENT    . LEFT HEART CATH AND CORONARY ANGIOGRAPHY N/A 10/23/2017   Procedure: LEFT HEART CATH AND CORONARY ANGIOGRAPHY;  Surgeon: Troy Sine, MD;  Location: [facility];  Service: Cardiovascular;  Laterality: N/A;  . LUMBAR LAMINECTOMY/DECOMPRESSION MICRODISCECTOMY Bilateral 06/09/2018   Procedure: Laminectomy and Foraminotomy - Lumbar Two,Lumbar Three - Lumbar One- Two - Lumbar Three-Four- bilateral;  Surgeon: Kary Kos, MD;  Location: [facility];  Service: Neurosurgery;  Laterality: Bilateral;  . PATELLA FRACTURE SURGERY Left "after 1992"   "after the replacement"  . REDUCTION MAMMAPLASTY Bilateral   . REPLACEMENT TOTAL KNEE Bilateral    "left in 1992; right in 2001"  . VAGINAL HYSTERECTOMY  1972   Social History   Occupational History  . Not on file  Tobacco Use  . Smoking status: Never Smoker  . Smokeless tobacco: Never Used  Substance and  Sexual Activity  . Alcohol use: No  . Drug use: No  . Sexual activity: Not on file

## 2019-10-23 ENCOUNTER — Encounter: Payer: Self-pay | Admitting: Orthopedic Surgery

## 2019-11-17 ENCOUNTER — Encounter: Payer: Self-pay | Admitting: Physical Medicine and Rehabilitation

## 2019-11-17 ENCOUNTER — Other Ambulatory Visit: Payer: Self-pay

## 2019-11-17 ENCOUNTER — Ambulatory Visit (INDEPENDENT_AMBULATORY_CARE_PROVIDER_SITE_OTHER): Payer: Medicare PPO | Admitting: Physical Medicine and Rehabilitation

## 2019-11-17 DIAGNOSIS — R202 Paresthesia of skin: Secondary | ICD-10-CM | POA: Diagnosis not present

## 2019-11-17 NOTE — Progress Notes (Signed)
 .  Numeric Pain Rating Scale and Functional Assessment Average Pain 3   In the last MONTH (on 0-10 scale) has pain interfered with the following?  1. General activity like being  able to carry out your everyday physical activities such as walking, climbing stairs, carrying groceries, or moving a chair?  Rating(4)

## 2019-11-17 NOTE — Progress Notes (Signed)
Wendy Carrillo - 79 y.o. female MRN [id number]  Date of birth: 06-Oct-1941  Office Visit Note: Visit Date: 11/17/2019 PCP: Jilda Panda, MD Referred by: Jilda Panda, MD  Subjective: Chief Complaint  Patient presents with  . Right Shoulder - Pain  . Left Shoulder - Pain  . Right Arm - Pain  . Left Arm - Pain  . Right Hand - Numbness, Pain  . Left Wrist - Pain, Numbness, Tingling  . Left Hand - Pain, Numbness   HPI: Wendy Carrillo is a 79 y.o. female who comes in today At the request of Dr. Landry Dyke team for electrodiagnostic study of the right upper limb with specific question for ulnar neuropathy at the elbow or cubital tunnel syndrome.  Patient's right hand dominant.  She reports symptoms starting after a fall about 1 month ago where she landed on her right side.  She endorses pain in the right shoulder but also left shoulder right arm and left arm.  She gets numbness and tingling in the right hand and really has a hard time showed me which fingers are involved.  She reports the fifth digit seems to want to stand up more and it is 9 point on that side.  She does endorse some muscle atrophy and weakness of the right hand.  She also talks about pain and numbness and tingling left hand.  She is status post prior carpal tunnel release on the right.  She denies any specific radicular pain down the arms.  She has had issues with lumbar spine problems treated by Dr. Kary Kos and [facility].  She does not have any imaging at least on our system for the cervical spine.  She reports no recent prior electrodiagnostic study.  ROS Otherwise per HPI.  Assessment & Plan: Visit Diagnoses:  1. Paresthesia of skin     Plan: Impression: The above electrodiagnostic study is ABNORMAL and reveals evidence of:  1.   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 The lesion is characterized by sensory  and motor demyelination with evidence of significant axonal injury.    2.  A mild right median nerve entrapment at the wrist affecting sensory components.   There is no significant electrodiagnostic evidence of any other focal nerve entrapment, brachial plexopathy or cervical radiculopathy.   Recommendations: 1.  Follow-up with referring physician. 2.  Continue current management of symptoms. 3.  Suggest surgical evaluation.  Meds & Orders: No orders of the defined types were placed in this encounter.   Orders Placed This Encounter  Procedures  . NCV with EMG (electromyography)    Follow-up: Return in about 2 weeks (around 12/01/2019) for  Anderson Malta, M.D..   Procedures: No procedures performed  EMG & NCV Findings: Evaluation of the right ulnar motor nerve showed prolonged distal onset latency (8.3 ms), reduced amplitude (0.4 mV), decreased conduction velocity (B Elbow-Wrist, 44 m/s), and decreased conduction velocity (A Elbow-B Elbow, 25 m/s).  The right median (across palm) sensory nerve showed no response (Palm) and prolonged distal peak latency (4.1 ms).  The right ulnar sensory nerve showed prolonged distal peak latency (4.0 ms), reduced amplitude (13.6 V), and decreased conduction velocity (Wrist-5th Digit, 35 m/s).  All remaining nerves (as indicated in the following tables) were within normal limits.    All examined muscles (as indicated in the following table) showed no evidence of electrical instability.  Impression: The above electrodiagnostic study is ABNORMAL and reveals evidence of:  1.   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 The lesion is characterized by sensory and motor demyelination with evidence of significant axonal injury.    2.  A mild right median nerve entrapment at the wrist affecting sensory components.   There is no significant electrodiagnostic evidence of any other  focal nerve entrapment, brachial plexopathy or cervical radiculopathy.   Recommendations: 1.  Follow-up with referring physician. 2.  Continue current management of symptoms. 3.  Suggest surgical evaluation.  ___________________________ Laurence Spates FAAPMR Board Certified, American Board of Physical Medicine and Rehabilitation    Nerve Conduction Studies Anti Sensory Summary Table   Stim Site NR Peak (ms) Norm Peak (ms) P-T Amp (V) Norm P-T Amp Site1 Site2 Delta-P (ms) Dist (cm) Vel (m/s) Norm Vel (m/s)  Right Median Acr Palm Anti Sensory (2nd Digit)  29.7C  Wrist    *4.1 <3.6 23.4 >10 Wrist Palm  0.0    Palm *NR  <2.0          Right Radial Anti Sensory (Base 1st Digit)  30C  Wrist    2.3 <3.1 22.1  Wrist Base 1st Digit 2.3 0.0    Right Ulnar Anti Sensory (5th Digit)  30C  Wrist    *4.0 <3.7 *13.6 >15.0 Wrist 5th Digit 4.0 14.0 *35 >38   Motor Summary Table   Stim Site NR Onset (ms) Norm Onset (ms) O-P Amp (mV) Norm O-P Amp Site1 Site2 Delta-0 (ms) Dist (cm) Vel (m/s) Norm Vel (m/s)  Right Median Motor (Abd Poll Brev)  30C  Wrist    3.8 <4.2 5.4 >5 Elbow Wrist 3.9 20.5 53 >50  Elbow    7.7  1.9         Right Ulnar Motor (Abd Dig Min)  30.2C  Wrist    *8.3 <4.2 *0.4 >3 B Elbow Wrist 4.2 18.5 *44 >53  B Elbow    12.5  0.3  A Elbow B Elbow 4.0 10.0 *25 >53  A Elbow    8.5  0.5          EMG   Side Muscle Nerve Root Ins Act Fibs Psw Amp Dur Poly Recrt Int Fraser Din Comment  Right Abd Poll Brev Median C8-T1 Nml Nml Nml Nml Nml 0 Nml Nml   Right 1stDorInt Ulnar C8-T1 Inc 3+ 3+ Nml Nml 0 Dcr Nml   Right PronatorTeres Median C6-7 Nml Nml Nml Nml Nml 0 Nml Nml   Right Biceps Musculocut C5-6 Nml Nml Nml Nml Nml 0 Nml Nml   Right Deltoid Axillary C5-6 Nml Nml Nml Nml Nml 0 Nml Nml     Nerve Conduction Studies Anti Sensory Left/Right Comparison   Stim Site L Lat (ms) R Lat (ms) L-R Lat (ms) L Amp (V) R Amp (V) L-R Amp (%) Site1 Site2 L Vel (m/s) R Vel (m/s) L-R Vel (m/s)  Median  Acr Palm Anti Sensory (2nd Digit)  29.7C  Wrist  *4.1   23.4  Wrist Palm     Palm             Radial Anti Sensory (Base 1st Digit)  30C  Wrist  2.3   22.1  Wrist Base 1st Digit     Ulnar Anti Sensory (5th Digit)  30C  Wrist  *4.0   *13.6  Wrist 5th Digit  *35    Motor Left/Right Comparison  Stim Site L Lat (ms) R Lat (ms) L-R Lat (ms) L Amp (mV) R Amp (mV) L-R Amp (%) Site1 Site2 L Vel (m/s) R Vel (m/s) L-R Vel (m/s)  Median Motor (Abd Poll Brev)  30C  Wrist  3.8   5.4  Elbow Wrist  53   Elbow  7.7   1.9        Ulnar Motor (Abd Dig Min)  30.2C  Wrist  *8.3   *0.4  B Elbow Wrist  *44   B Elbow  12.5   0.3  A Elbow B Elbow  *25   A Elbow  8.5   0.5           Waveforms:             Clinical History: EXAM: MRI LUMBAR SPINE WITHOUT CONTRAST  TECHNIQUE: Multiplanar, multisequence MR imaging of the lumbar spine was performed. No intravenous contrast was administered.  COMPARISON:  05/29/2017  FINDINGS: The study is mildly motion degraded despite repeated imaging attempts.  Segmentation: Standard.  Alignment: Mild S-shaped lumbar scoliosis. Unchanged grade 1 retrolisthesis of T12 on L1, L1 on L2, and L5 on S1 and grade 1 anterolisthesis of L4 on L5.  Vertebrae: No fracture or suspicious osseous lesion. Degenerative endplate changes at [id number] including minimal edema.  Conus medullaris and cauda equina: Conus extends to the L1-2 level. Conus and cauda equina appear normal.  Paraspinal and other soft tissues: Partially visualized large right upper pole renal cyst measuring 6 cm, similar to prior. 9 mm right lower pole renal cyst.  Disc levels:  Disc desiccation throughout the lumbar and included lower thoracic spine. Severe disc space narrowing at T12-L1 and L5-S1 with milder narrowing throughout the remainder of the lumbar and lower thoracic spine with exception of L4-5.  T12-L1: Circumferential disc osteophyte complex and retrolisthesis result  in moderate to severe right neural foraminal stenosis without spinal stenosis, unchanged.  L1-2: Circumferential disc bulging and moderate facet and ligamentum flavum hypertrophy result in mild right and moderate left lateral recess stenosis and moderate to severe bilateral neural foraminal stenosis, unchanged. Prominent dorsal epidural fat contributes to unchanged mild spinal stenosis.  L2-3: Circumferential disc bulging, moderate facet and ligamentum flavum hypertrophy, and prominent dorsal epidural fat result in mild-to-moderate right and moderate left lateral recess stenosis, moderate spinal stenosis, and mild-to-moderate right and moderate left neural foraminal stenosis, overall slightly progressed from prior (most notably the spinal and left lateral recess stenosis).  L3-4: Circumferential disc bulging and severe facet and ligamentum flavum hypertrophy result in moderate spinal stenosis, mild-to-moderate bilateral lateral recess stenosis, and mild-to-moderate right and moderate left neural foraminal stenosis, unchanged.  L4-5: Anterolisthesis with mild rightward bulging of uncovered disc and severe facet and ligamentum flavum hypertrophy result in mild to moderate spinal stenosis, mild-to-moderate right and mild left lateral recess stenosis, and moderate right and mild left neural foraminal stenosis, unchanged.  L5-S1: Circumferential disc bulging, severe disc space height loss, and moderate facet hypertrophy result in mild-to-moderate bilateral lateral recess and severe bilateral neural foraminal stenosis without spinal stenosis, unchanged.  IMPRESSION: 1. Slightly progressive findings at L2-3 including moderate spinal stenosis, moderate left lateral recess stenosis, and moderate left neural foraminal stenosis. 2. Unchanged disc and facet degeneration elsewhere as above.   Electronically Signed   By: [name] M.D.   On: 02/01/2018 14:40   She reports  that she has never smoked. She has never used smokeless tobacco. No results for input(s): HGBA1C, LABURIC in the last 8760  hours.  Objective:  VS:  HT:    WT:   BMI:     BP:   HR: bpm  TEMP: ( )  RESP:  Physical Exam Musculoskeletal:        General: No swelling, tenderness or deformity.     Comments: Inspection reveals atrophy of the right FDI and ADM and hand intrinsics but no atrophy of the bilateral APB or left FDI or hand intrinsics. There is no swelling, color changes, allodynia or dystrophic changes.  There is a positive Wartenberg's sign on the right. There is 5 out of 5 strength in the bilateral wrist extension and long finger flexion but 3-4 out of 5 right finger abduction.  There is decreased sensation to light touch in the right ulnar nerve distribution.  There is a positive Froment's test on the right.  There is a negative Hoffmann's test bilaterally.  Skin:    General: Skin is warm and dry.     Findings: No erythema or rash.  Neurological:     General: No focal deficit present.     Mental Status: She is alert and oriented to person, place, and time.     Motor: No weakness or abnormal muscle tone.     Coordination: Coordination normal.  Psychiatric:        Mood and Affect: Mood normal.        Behavior: Behavior normal.     Ortho Exam Imaging: No results found.  Past Medical/Family/Surgical/Social History: Medications & Allergies reviewed per EMR, new medications updated. Patient Active Problem List   Diagnosis Date Noted  . Spinal stenosis at L4-L5 level 05/18/2019  . Benign essential HTN   . History of total bilateral knee replacement (TKR)   . Acute blood loss anemia   . Post-operative pain   . Spinal stenosis of lumbar region 06/09/2018  . Spinal stenosis, lumbar   . Hypertension   . History of duodenal ulcer   . Hepatitis   . Gastritis   . Family history of adverse reaction to anesthesia   . Chronic lower back pain   . Arthritis   . Coronary artery  calcification 10/21/2017  . Combined forms of age-related cataract of right eye 04/18/2017  . Status post total bilateral knee replacement 06/08/2016  . Left lumbar radiculopathy 05/09/2016  . Bilateral shoulder pain 03/22/2016  . DDD (degenerative disc disease), cervical 03/22/2016  . Morbid obesity with BMI of 40.0-44.9, adult ([place]) 03/08/2016  . Spondylolisthesis, 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/2016  . Neck pain 03/08/2016  . Lumbar stenosis 03/08/2016  . Cervical spinal stenosis 03/08/2016  . Back pain 11/16/2015  . 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/10/2015  . Angina pectoris ([place]) 04/20/2015  . Morbid obesity ([place]) 04/20/2015  . Dyspnea 04/20/2015  . Atypical chest pain 04/20/2015  . Esophageal reflux 02/23/2015  . Essential hypertension 02/23/2015  . Chronic back pain 02/23/2015  . Chest pain 02/22/2015  . Abdominal tenderness, LLQ (left lower quadrant) 06/15/2014  . Acute sinusitis 06/15/2014  . Allergic rhinitis due to pollen 06/15/2014  . Bladder disorder 06/15/2014  . Epistaxis 06/15/2014  . Female stress incontinence 06/15/2014  . 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 Insomnia 06/15/2014  . Localized primary osteoarthritis of wrist 06/15/2014  . Lumbosacral spondylosis 06/15/2014  . Osteoarthrosis, localized, primary, hand 06/15/2014  . Referred otalgia 06/15/2014  . Tingling 06/15/2014  . Tinnitus 06/15/2014  . Urinary incontinence 06/15/2014  . Cystocele 05/04/2014  . Retention of urine 05/04/2014  . Cystocele, lateral 04/12/2014  . Rectocele 03/17/2014  .  History of blood transfusion 11/05/1990   Past Medical History:  Diagnosis Date  . Arthritis    "all over"  . Chronic lower back pain   . Coronary artery calcification 10/21/2017  . Family history of adverse reaction to anesthesia    "one of my son's passed out"  . Gastritis   . GERD (gastroesophageal reflux disease)   . 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  History of blood transfusion 1992   "related to knee replacement"  . History of duodenal ulcer   . Hypertension   . PONV (postoperative nausea and vomiting)   . Spinal headache   . Spinal stenosis, lumbar    Family History  Problem Relation Age of Onset  . Hypertension Mother   . Stroke Mother   . Diabetes Mother   . Heart attack Father   . Breast cancer Sister   . Leukemia Brother   . Breast cancer Sister   . Breast cancer Sister   . Heart disease Brother    Past Surgical History:  Procedure Laterality Date  . ABDOMINAL HERNIA REPAIR    . ANTERIOR AND POSTERIOR VAGINAL REPAIR  2015  . APPENDECTOMY  ?1980's  . BLADDER SUSPENSION    . BREAST CYST EXCISION Bilateral   . BREAST SURGERY Left    "nipple taken off"  . CARDIAC CATHETERIZATION    . CARPAL TUNNEL RELEASE Bilateral 2010's  . CATARACT EXTRACTION W/ INTRAOCULAR LENS IMPLANT Left 2014  . CHOLECYSTECTOMY OPEN  1980's  . DILATION AND CURETTAGE OF UTERUS  "several"  . HERNIA REPAIR    . JOINT REPLACEMENT    . LEFT HEART CATH AND CORONARY ANGIOGRAPHY N/A 10/23/2017   Procedure: LEFT HEART CATH AND CORONARY ANGIOGRAPHY;  Surgeon: Troy Sine, MD;  Location: [facility];  Service: Cardiovascular;  Laterality: N/A;  . LUMBAR LAMINECTOMY/DECOMPRESSION MICRODISCECTOMY Bilateral 06/09/2018   Procedure: Laminectomy and Foraminotomy - Lumbar Two,Lumbar Three - Lumbar One- Two - Lumbar Three-Four- bilateral;  Surgeon: Kary Kos, MD;  Location: [facility];  Service: Neurosurgery;  Laterality: Bilateral;  . PATELLA FRACTURE SURGERY Left "after 1992"   "after the replacement"  . REDUCTION MAMMAPLASTY Bilateral   . REPLACEMENT TOTAL KNEE Bilateral    "left in 1992; right in 2001"  . VAGINAL HYSTERECTOMY  1972   Social History   Occupational History  . Not on file  Tobacco Use  . Smoking status: Never Smoker  . Smokeless tobacco: Never Used  Substance and Sexual Activity  . Alcohol use: No  . Drug use: No  . Sexual  activity: Not on file

## 2019-11-18 NOTE — Procedures (Signed)
EMG & NCV Findings: Evaluation of the right ulnar motor nerve showed prolonged distal onset latency (8.3 ms), reduced amplitude (0.4 mV), decreased conduction velocity (B Elbow-Wrist, 44 m/s), and decreased conduction velocity (A Elbow-B Elbow, 25 m/s).  The right median (across palm) sensory nerve showed no response (Palm) and prolonged distal peak latency (4.1 ms).  The right ulnar sensory nerve showed prolonged distal peak latency (4.0 ms), reduced amplitude (13.6 V), and decreased conduction velocity (Wrist-5th Digit, 35 m/s).  All remaining nerves (as indicated in the following tables) were within normal limits.    All examined muscles (as indicated in the following table) showed no evidence of electrical instability.    Impression: The above electrodiagnostic study is ABNORMAL and reveals evidence of:  1.   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 The lesion is characterized by sensory and motor demyelination with evidence of significant axonal injury.    2.  A mild right median nerve entrapment at the wrist affecting sensory components.   There is no significant electrodiagnostic evidence of any other focal nerve entrapment, brachial plexopathy or cervical radiculopathy.   Recommendations: 1.  Follow-up with referring physician. 2.  Continue current management of symptoms. 3.  Suggest surgical evaluation.  ___________________________ Laurence Spates FAAPMR Board Certified, American Board of Physical Medicine and Rehabilitation    Nerve Conduction Studies Anti Sensory Summary Table   Stim Site NR Peak (ms) Norm Peak (ms) P-T Amp (V) Norm P-T Amp Site1 Site2 Delta-P (ms) Dist (cm) Vel (m/s) Norm Vel (m/s)  Right Median Acr Palm Anti Sensory (2nd Digit)  29.7C  Wrist    *4.1 <3.6 23.4 >10 Wrist Palm  0.0    Palm *NR  <2.0          Right Radial Anti Sensory (Base 1st Digit)  30C  Wrist    2.3 <3.1 22.1   Wrist Base 1st Digit 2.3 0.0    Right Ulnar Anti Sensory (5th Digit)  30C  Wrist    *4.0 <3.7 *13.6 >15.0 Wrist 5th Digit 4.0 14.0 *35 >38   Motor Summary Table   Stim Site NR Onset (ms) Norm Onset (ms) O-P Amp (mV) Norm O-P Amp Site1 Site2 Delta-0 (ms) Dist (cm) Vel (m/s) Norm Vel (m/s)  Right Median Motor (Abd Poll Brev)  30C  Wrist    3.8 <4.2 5.4 >5 Elbow Wrist 3.9 20.5 53 >50  Elbow    7.7  1.9         Right Ulnar Motor (Abd Dig Min)  30.2C  Wrist    *8.3 <4.2 *0.4 >3 B Elbow Wrist 4.2 18.5 *44 >53  B Elbow    12.5  0.3  A Elbow B Elbow 4.0 10.0 *25 >53  A Elbow    8.5  0.5          EMG   Side Muscle Nerve Root Ins Act Fibs Psw Amp Dur Poly Recrt Int [place] Din Comment  Right Abd Poll Brev Median C8-T1 Nml Nml Nml Nml Nml 0 Nml Nml   Right 1stDorInt Ulnar C8-T1 Inc 3+ 3+ Nml Nml 0 Dcr Nml   Right PronatorTeres Median C6-7 Nml Nml Nml Nml Nml 0 Nml Nml   Right Biceps Musculocut C5-6 Nml Nml Nml Nml Nml 0 Nml Nml   Right Deltoid Axillary C5-6 Nml Nml Nml Nml Nml 0 Nml Nml     Nerve Conduction Studies Anti Sensory Left/Right Comparison   Stim Site  L Lat (ms) R Lat (ms) L-R Lat (ms) L Amp (V) R Amp (V) L-R Amp (%) Site1 Site2 L Vel (m/s) R Vel (m/s) L-R Vel (m/s)  Median Acr Palm Anti Sensory (2nd Digit)  29.7C  Wrist  *4.1   23.4  Wrist Palm     Palm             Radial Anti Sensory (Base 1st Digit)  30C  Wrist  2.3   22.1  Wrist Base 1st Digit     Ulnar Anti Sensory (5th Digit)  30C  Wrist  *4.0   *13.6  Wrist 5th Digit  *35    Motor Left/Right Comparison   Stim Site L Lat (ms) R Lat (ms) L-R Lat (ms) L Amp (mV) R Amp (mV) L-R Amp (%) Site1 Site2 L Vel (m/s) R Vel (m/s) L-R Vel (m/s)  Median Motor (Abd Poll Brev)  30C  Wrist  3.8   5.4  Elbow Wrist  53   Elbow  7.7   1.9        Ulnar Motor (Abd Dig Min)  30.2C  Wrist  *8.3   *0.4  B Elbow Wrist  *44   B Elbow  12.5   0.3  A Elbow B Elbow  *25   A Elbow  8.5   0.5           Waveforms:

## 2019-11-27 ENCOUNTER — Ambulatory Visit: Payer: Medicare PPO | Admitting: Orthopedic Surgery

## 2019-11-27 ENCOUNTER — Other Ambulatory Visit: Payer: Self-pay

## 2019-11-27 ENCOUNTER — Ambulatory Visit (INDEPENDENT_AMBULATORY_CARE_PROVIDER_SITE_OTHER): Payer: Medicare PPO

## 2019-11-27 DIAGNOSIS — G5621 Lesion of ulnar nerve, right upper limb: Secondary | ICD-10-CM | POA: Diagnosis not present

## 2019-11-27 DIAGNOSIS — M5412 Radiculopathy, cervical region: Secondary | ICD-10-CM | POA: Diagnosis not present

## 2019-11-27 DIAGNOSIS — M542 Cervicalgia: Secondary | ICD-10-CM

## 2019-12-01 NOTE — Progress Notes (Signed)
Office Visit Note   Patient: Wendy Carrillo           Date of Birth: 11-24-1940           MRN: [id number] Visit Date: 11/27/2019 Requested by: Jilda Panda, MD [address] [place],  [place] [address] PCP: Jilda Panda, MD  Subjective: Chief Complaint  Patient presents with  . Right Wrist - Follow-up  . Right Hand - Follow-up    HPI: Wendy Carrillo is a 79 y.o. female who presents to the office complaining of right arm pain and hand weakness..  She returns to discuss nerve conduction study results.  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  Since her last office visit, patient notes that her symptoms have been worsening.  She has developed neck pain as well as worsening radicular pain that travels from her neck down her right arm.  The pain extends into her right hand.  She notes a loss of dexterity with her right hand.  She has been using a wrist brace with little relief.  She states that her arm has been "a little weak for a long time".  She denies any history of neck surgery or epidural steroid injections into her neck.  She does report a history of lower back disc decompression without fusion..                ROS:  All systems reviewed are negative as they relate to the chief complaint within the history of present illness.  Patient denies fevers or chills.  Assessment & Plan: Visit Diagnoses:  1. Cervicalgia   2. Cervical radiculopathy   3. Ulnar neuropathy of right upper extremity     Plan:  Patient is a 79 year old female who presents for follow-up of nerve conduction study results.  Nerve conduction study revealed severe ulnar neuropathy with compression at the right elbow.  This is likely the major source of her symptomatic weakness in the right hand; in addition she has developed neck pain with radicular neck symptoms that run from her neck down her right arm into her hand.  She has been  losing dexterity over the last several weeks.  C-spine x-rays were taken today and revealed significant degenerative disc disease and possible pathology responsible for part of her symptoms.  She does have some weakness with grip strength and finger abduction on exam as well as a positive Spurling sign.  Ordered MRI of the cervical spine to evaluate for right-sided radiculopathy.  Patient will follow-up after MRI to review results decision point at that time will be for or against cervical spine injections and or for or against ulnar nerve decompression at the elbow..  Follow-Up Instructions: No follow-ups on file.   Orders:  Orders Placed This Encounter  Procedures  . XR Cervical Spine 2 or 3 views  . MR Cervical Spine w/o contrast   No orders of the defined types were placed in this encounter.     Procedures: No procedures performed   Clinical Data: No additional findings.  Objective: Vital Signs: There were no vitals taken for this visit.  Physical Exam:  Constitutional: Patient appears well-developed HEENT:  Head: Normocephalic Eyes:EOM are normal Neck: Normal range of motion Cardiovascular: Normal rate Pulmonary/chest: Effort normal Neurologic: Patient is alert Skin: Skin is warm Psychiatric: Patient has normal mood and affect  Ortho Exam:  5/5 motor strength of pronation, supination, bicep, tricep, deltoid.  Weakness with grip strength and finger abduction of the right arm.  Positive Spurling sign.  Mild tenderness palpation through the axial cervical spine.  Tender to palpation throughout the right side paraspinal musculature.  Specialty Comments:  No specialty comments available.  Imaging: No results found.   PMFS History: Patient Active Problem List   Diagnosis Date Noted  . Spinal stenosis at L4-L5 level 05/18/2019  . Benign essential HTN   . History of total bilateral knee replacement (TKR)   . Acute blood loss anemia   . Post-operative pain   . Spinal  stenosis of lumbar region 06/09/2018  . Spinal stenosis, lumbar   . Hypertension   . History of duodenal ulcer   . Hepatitis   . Gastritis   . Family history of adverse reaction to anesthesia   . Chronic lower back pain   . Arthritis   . Coronary artery calcification 10/21/2017  . Combined forms of age-related cataract of right eye 04/18/2017  . Status post total bilateral knee replacement 06/08/2016  . Left lumbar radiculopathy 05/09/2016  . Bilateral shoulder pain 03/22/2016  . DDD (degenerative disc disease), cervical 03/22/2016  . Morbid obesity with BMI of 40.0-44.9, adult ([place]) 03/08/2016  . Spondylolisthesis, 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/2016  . Neck pain 03/08/2016  . Lumbar stenosis 03/08/2016  . Cervical spinal stenosis 03/08/2016  . Back pain 11/16/2015  . 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/10/2015  . Angina pectoris ([place]) 04/20/2015  . Morbid obesity ([place]) 04/20/2015  . Dyspnea 04/20/2015  . Atypical chest pain 04/20/2015  . Esophageal reflux 02/23/2015  . Essential hypertension 02/23/2015  . Chronic back pain 02/23/2015  . Chest pain 02/22/2015  . Abdominal tenderness, LLQ (left lower quadrant) 06/15/2014  . Acute sinusitis 06/15/2014  . Allergic rhinitis due to pollen 06/15/2014  . Bladder disorder 06/15/2014  . Epistaxis 06/15/2014  . Female stress incontinence 06/15/2014  . 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 Insomnia 06/15/2014  . Localized primary osteoarthritis of wrist 06/15/2014  . Lumbosacral spondylosis 06/15/2014  . Osteoarthrosis, localized, primary, hand 06/15/2014  . Referred otalgia 06/15/2014  . Tingling 06/15/2014  . Tinnitus 06/15/2014  . Urinary incontinence 06/15/2014  . Cystocele 05/04/2014  . Retention of urine 05/04/2014  . Cystocele, lateral 04/12/2014  . Rectocele 03/17/2014  . History of blood transfusion 11/05/1990   Past Medical History:  Diagnosis Date  . Arthritis    "all over"  . Chronic lower back pain   . Coronary artery  calcification 10/21/2017  . Family history of adverse reaction to anesthesia    "one of my son's passed out"  . Gastritis   . GERD (gastroesophageal reflux disease)   . 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 History of blood transfusion 1992   "related to knee replacement"  . History of duodenal ulcer   . Hypertension   . PONV (postoperative nausea and vomiting)   . Spinal headache   . Spinal stenosis, lumbar     Family History  Problem Relation Age of Onset  . Hypertension Mother   . Stroke Mother   . Diabetes Mother   . Heart attack Father   . Breast cancer Sister   . Leukemia Brother   . Breast cancer Sister   . Breast cancer Sister   . Heart disease Brother     Past Surgical History:  Procedure Laterality Date  . ABDOMINAL HERNIA REPAIR    . ANTERIOR AND POSTERIOR  VAGINAL REPAIR  2015  . APPENDECTOMY  ?1980's  . BLADDER SUSPENSION    . BREAST CYST EXCISION Bilateral   . BREAST SURGERY Left    "nipple taken off"  . CARDIAC CATHETERIZATION    . CARPAL TUNNEL RELEASE Bilateral 2010's  . CATARACT EXTRACTION W/ INTRAOCULAR LENS IMPLANT Left 2014  . CHOLECYSTECTOMY OPEN  1980's  . DILATION AND CURETTAGE OF UTERUS  "several"  . HERNIA REPAIR    . JOINT REPLACEMENT    . LEFT HEART CATH AND CORONARY ANGIOGRAPHY N/A 10/23/2017   Procedure: LEFT HEART CATH AND CORONARY ANGIOGRAPHY;  Surgeon: Troy Sine, MD;  Location: [facility];  Service: Cardiovascular;  Laterality: N/A;  . LUMBAR LAMINECTOMY/DECOMPRESSION MICRODISCECTOMY Bilateral 06/09/2018   Procedure: Laminectomy and Foraminotomy - Lumbar Two,Lumbar Three - Lumbar One- Two - Lumbar Three-Four- bilateral;  Surgeon: Kary Kos, MD;  Location: [facility];  Service: Neurosurgery;  Laterality: Bilateral;  . PATELLA FRACTURE SURGERY Left "after 1992"   "after the replacement"  . REDUCTION MAMMAPLASTY Bilateral   . REPLACEMENT TOTAL KNEE Bilateral    "left in  1992; right in 2001"  . VAGINAL HYSTERECTOMY  1972   Social History   Occupational History  . Not on file  Tobacco Use  . Smoking status: Never Smoker  . Smokeless tobacco: Never Used  Substance and Sexual Activity  . Alcohol use: No  . Drug use: No  . Sexual activity: Not on file

## 2019-12-04 ENCOUNTER — Encounter: Payer: Self-pay | Admitting: Orthopedic Surgery

## 2019-12-22 ENCOUNTER — Other Ambulatory Visit: Payer: Self-pay | Admitting: Neurosurgery

## 2019-12-22 ENCOUNTER — Telehealth: Payer: Self-pay | Admitting: Nurse Practitioner

## 2019-12-22 DIAGNOSIS — M4807 Spinal stenosis, lumbosacral region: Secondary | ICD-10-CM

## 2019-12-22 NOTE — Telephone Encounter (Signed)
Phone call to patient to review instructions for 13 hr prep for a nerve root block injection w/ contrast on 2/17 at 1230. Prescription called into [facility]. Pt aware and verbalized understanding of instructions. Prescription: 2330- 50mg  Prednisone 0530- 50mg  Prednisone 1130- 50mg  Prednisone and 50mg  Benadryl

## 2019-12-23 ENCOUNTER — Ambulatory Visit
Admission: RE | Admit: 2019-12-23 | Discharge: 2019-12-23 | Disposition: A | Discharge status: Home / Self Care | Payer: Medicare PPO | Source: Ambulatory Visit | Attending: Neurosurgery | Admitting: Neurosurgery

## 2019-12-23 ENCOUNTER — Other Ambulatory Visit: Payer: Self-pay

## 2019-12-23 ENCOUNTER — Other Ambulatory Visit: Payer: Self-pay | Admitting: Neurosurgery

## 2019-12-23 ENCOUNTER — Other Ambulatory Visit: Payer: Medicare PPO

## 2019-12-23 DIAGNOSIS — M4807 Spinal stenosis, lumbosacral region: Secondary | ICD-10-CM

## 2019-12-23 MED ORDER — IOPAMIDOL (ISOVUE-M 200) INJECTION 41%: 1.0000 mL | Freq: Once | INTRAMUSCULAR | Status: DC

## 2019-12-23 MED ORDER — METHYLPREDNISOLONE ACETATE 40 MG/ML INJ SUSP (RADIOLOG: 120.0000 mg | Freq: Once | INTRAMUSCULAR | Status: DC

## 2019-12-23 NOTE — Discharge Instructions (Signed)

## 2019-12-24 ENCOUNTER — Other Ambulatory Visit: Payer: Medicare PPO

## 2019-12-26 ENCOUNTER — Ambulatory Visit
Admission: RE | Admit: 2019-12-26 | Discharge: 2019-12-26 | Disposition: A | Discharge status: Home / Self Care | Payer: Medicare PPO | Source: Ambulatory Visit | Attending: Orthopedic Surgery | Admitting: Orthopedic Surgery

## 2019-12-26 DIAGNOSIS — M542 Cervicalgia: Secondary | ICD-10-CM

## 2019-12-30 ENCOUNTER — Other Ambulatory Visit: Payer: Self-pay

## 2019-12-30 ENCOUNTER — Ambulatory Visit: Payer: Medicare PPO | Admitting: Orthopedic Surgery

## 2019-12-30 ENCOUNTER — Encounter: Payer: Self-pay | Admitting: Orthopedic Surgery

## 2019-12-30 DIAGNOSIS — M542 Cervicalgia: Secondary | ICD-10-CM | POA: Diagnosis not present

## 2020-01-02 ENCOUNTER — Encounter: Payer: Self-pay | Admitting: Orthopedic Surgery

## 2020-01-02 NOTE — Progress Notes (Signed)
Office Visit Note   Patient: Wendy Carrillo           Date of Birth: 1941-03-09           MRN: [id number] Visit Date: 12/30/2019 Requested by: Wendy Panda, MD [address] [place],  [place] [address] PCP: Wendy Panda, MD  Subjective: Chief Complaint  Patient presents with  . Follow-up    HPI: Wendy Carrillo is a 79 year old patient with neck pain and ulnar nerve compression in the right elbow.  Since have seen her she is had an MRI of the cervical spine which does show bilateral foraminal stenosis at C8 as well as degenerative changes within the cervical spine.  She states she can write a little bit better now than she could before.  Husband is having some hip pain issues.              ROS: All systems reviewed are negative as they relate to the chief complaint within the history of present illness.  Patient denies  fevers or chills.   Assessment & Plan: Visit Diagnoses:  1. 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  Plan is that her cervical spine MRI is not significantly changed from about 7 years ago.  I think she does have a component of double crush but no real left-sided symptoms.  Her hand writing and strength is little bit better on exam today.  I would favor observation with 53-month return to decide for or against ulnar nerve decompression at the elbow.  Follow-Up Instructions: No follow-ups on file.   Orders:  No orders of the defined types were placed in this encounter.  No orders of the defined types were placed in this encounter.     Procedures: No procedures performed   Clinical Data: No additional findings.  Objective: Vital Signs: There were no vitals taken for this visit.  Physical Exam:   Constitutional: Patient appears well-developed HEENT:  Head: Normocephalic Eyes:EOM are normal Neck: Normal range of motion Cardiovascular: Normal  rate Pulmonary/chest: Effort normal Neurologic: Patient is alert Skin: Skin is warm Psychiatric: Patient has normal mood and affect    Ortho Exam: Ortho exam demonstrates full active and passive range of motion of the elbow and wrist.  No subluxation of the ulnar nerve on the right.  Does have some interosseous wasting on the right-hand side compared to the left but her first dorsal interosseous strength is improved compared to last clinic visit.  Radial pulses intact.  Cervical spine range of motion unchanged.  Specialty Comments:  No specialty comments available.  Imaging: No results found.   PMFS History: Patient Active Problem List   Diagnosis Date Noted  . Spinal stenosis at L4-L5 level 05/18/2019  . Benign essential HTN   . History of total bilateral knee replacement (TKR)   . Acute blood loss anemia   . Post-operative pain   . Spinal stenosis of lumbar region 06/09/2018  . Spinal stenosis, lumbar   . Hypertension   . History of duodenal ulcer   . Hepatitis   . Gastritis   . Family history of adverse reaction to anesthesia   . Chronic lower back pain   . Arthritis   . Coronary artery calcification 10/21/2017  . Combined forms of age-related cataract of right eye 04/18/2017  . Status post total bilateral knee replacement 06/08/2016  . Left lumbar radiculopathy 05/09/2016  .  Bilateral shoulder pain 03/22/2016  . DDD (degenerative disc disease), cervical 03/22/2016  . Morbid obesity with BMI of 40.0-44.9, adult ([place]) 03/08/2016  . Spondylolisthesis, 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/2016  . Neck pain 03/08/2016  . Lumbar stenosis 03/08/2016  . Cervical spinal stenosis 03/08/2016  . Back pain 11/16/2015  . 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/10/2015  . Angina pectoris ([place]) 04/20/2015  . Morbid obesity ([place]) 04/20/2015  . Dyspnea 04/20/2015  . Atypical chest pain 04/20/2015  . Esophageal reflux 02/23/2015  . Essential hypertension 02/23/2015  . Chronic back pain 02/23/2015   . Chest pain 02/22/2015  . Abdominal tenderness, LLQ (left lower quadrant) 06/15/2014  . Acute sinusitis 06/15/2014  . Allergic rhinitis due to pollen 06/15/2014  . Bladder disorder 06/15/2014  . Epistaxis 06/15/2014  . Female stress incontinence 06/15/2014  . 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 Insomnia 06/15/2014  . Localized primary osteoarthritis of wrist 06/15/2014  . Lumbosacral spondylosis 06/15/2014  . Osteoarthrosis, localized, primary, hand 06/15/2014  . Referred otalgia 06/15/2014  . Tingling 06/15/2014  . Tinnitus 06/15/2014  . Urinary incontinence 06/15/2014  . Cystocele 05/04/2014  . Retention of urine 05/04/2014  . Cystocele, lateral 04/12/2014  . Rectocele 03/17/2014  . History of blood transfusion 11/05/1990   Past Medical History:  Diagnosis Date  . Arthritis    "all over"  . Chronic lower back pain   . Coronary artery calcification 10/21/2017  . Family history of adverse reaction to anesthesia    "one of my son's passed out"  . Gastritis   . GERD (gastroesophageal reflux disease)   . 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 History of blood transfusion 1992   "related to knee replacement"  . History of duodenal ulcer   . Hypertension   . PONV (postoperative nausea and vomiting)   . Spinal headache   . Spinal stenosis, lumbar     Family History  Problem Relation Age of Onset  . Hypertension Mother   . Stroke Mother   . Diabetes Mother   . Heart attack Father   . Breast cancer Sister   . Leukemia Brother   . Breast cancer Sister   . Breast cancer Sister   . Heart disease Brother     Past Surgical History:  Procedure Laterality Date  . ABDOMINAL HERNIA REPAIR    . ANTERIOR AND POSTERIOR VAGINAL REPAIR  2015  . APPENDECTOMY  ?1980's  . BLADDER SUSPENSION    . BREAST CYST EXCISION Bilateral   . BREAST SURGERY Left    "nipple taken off"  . CARDIAC CATHETERIZATION    . CARPAL TUNNEL RELEASE  Bilateral 2010's  . CATARACT EXTRACTION W/ INTRAOCULAR LENS IMPLANT Left 2014  . CHOLECYSTECTOMY OPEN  1980's  . DILATION AND CURETTAGE OF UTERUS  "several"  . HERNIA REPAIR    . JOINT REPLACEMENT    . LEFT HEART CATH AND CORONARY ANGIOGRAPHY N/A 10/23/2017   Procedure: LEFT HEART CATH AND CORONARY ANGIOGRAPHY;  Surgeon: Troy Sine, MD;  Location: [facility];  Service: Cardiovascular;  Laterality: N/A;  . LUMBAR LAMINECTOMY/DECOMPRESSION MICRODISCECTOMY Bilateral 06/09/2018   Procedure: Laminectomy and Foraminotomy - Lumbar Two,Lumbar Three - Lumbar One- Two - Lumbar Three-Four- bilateral;  Surgeon: Kary Kos, MD;  Location: [facility];  Service: Neurosurgery;  Laterality: Bilateral;  . PATELLA FRACTURE SURGERY Left "after 1992"   "after the replacement"  . REDUCTION MAMMAPLASTY Bilateral   . REPLACEMENT TOTAL KNEE  Bilateral    "left in 1992; right in 2001"  . VAGINAL HYSTERECTOMY  1972   Social History   Occupational History  . Not on file  Tobacco Use  . Smoking status: Never Smoker  . Smokeless tobacco: Never Used  Substance and Sexual Activity  . Alcohol use: No  . Drug use: No  . Sexual activity: Not on file

## 2020-01-14 ENCOUNTER — Other Ambulatory Visit: Payer: Self-pay | Admitting: Neurosurgery

## 2020-01-14 ENCOUNTER — Telehealth: Payer: Self-pay | Admitting: Nurse Practitioner

## 2020-01-14 DIAGNOSIS — M4807 Spinal stenosis, lumbosacral region: Secondary | ICD-10-CM

## 2020-01-14 NOTE — Telephone Encounter (Signed)
Phone call to patient to review instructions for 13 hr prep for epidural steroid injection w/ contrast on 3/18 at 1330. Prescription called into [facility] [address]. Pt aware and verbalized understanding of instructions. Prescription: 0030- 50mg  Prednisone 0630- 50mg  Prednisone 1230- 50mg  Prednisone and 50mg  Benadryl

## 2020-01-19 ENCOUNTER — Other Ambulatory Visit: Payer: Medicare PPO

## 2020-01-21 ENCOUNTER — Ambulatory Visit
Admission: RE | Admit: 2020-01-21 | Discharge: 2020-01-21 | Disposition: A | Discharge status: Home / Self Care | Payer: Medicare PPO | Source: Ambulatory Visit | Attending: Neurosurgery | Admitting: Neurosurgery

## 2020-01-21 ENCOUNTER — Other Ambulatory Visit: Payer: Self-pay

## 2020-01-21 DIAGNOSIS — M4807 Spinal stenosis, lumbosacral region: Secondary | ICD-10-CM

## 2020-01-21 MED ORDER — METHYLPREDNISOLONE ACETATE 40 MG/ML INJ SUSP (RADIOLOG
120.0000 mg | Freq: Once | INTRAMUSCULAR | Status: AC
  Administered 2020-01-21: 120 mg via EPIDURAL

## 2020-01-21 MED ORDER — IOPAMIDOL (ISOVUE-M 200) INJECTION 41%
1.0000 mL | Freq: Once | INTRAMUSCULAR | Status: AC
  Administered 2020-01-21: 1 mL via EPIDURAL

## 2020-01-21 NOTE — Discharge Instructions (Signed)

## 2020-02-29 ENCOUNTER — Ambulatory Visit: Payer: Medicare PPO | Admitting: Orthopedic Surgery

## 2020-03-18 ENCOUNTER — Telehealth: Payer: Self-pay | Admitting: Nurse Practitioner

## 2020-03-18 ENCOUNTER — Other Ambulatory Visit: Payer: Self-pay | Admitting: Student

## 2020-03-18 DIAGNOSIS — M48062 Spinal stenosis, lumbar region with neurogenic claudication: Secondary | ICD-10-CM

## 2020-03-18 NOTE — Telephone Encounter (Signed)
Phone call to patient to review instructions for 13 hr prep for spinal injection w/ iodinated contrast on [facility] at 1100. Prescription called into [facility]. Pt aware and verbalized understanding of instructions. Prescription: 2200- 50mg  Prednisone 0400- 50mg  Prednisone 1000- 50mg  Prednisone and 50mg  Benadryl

## 2020-03-21 ENCOUNTER — Ambulatory Visit: Payer: Medicare PPO | Admitting: Orthopedic Surgery

## 2020-03-23 ENCOUNTER — Inpatient Hospital Stay: Admission: RE | Admit: 2020-03-23 | Payer: Medicare PPO | Source: Ambulatory Visit

## 2020-03-24 ENCOUNTER — Telehealth: Payer: Self-pay | Admitting: Orthopedic Surgery

## 2020-03-24 ENCOUNTER — Ambulatory Visit
Admission: RE | Admit: 2020-03-24 | Discharge: 2020-03-24 | Disposition: A | Discharge status: Home / Self Care | Payer: Medicare PPO | Source: Ambulatory Visit | Attending: Student | Admitting: Student

## 2020-03-24 DIAGNOSIS — M48062 Spinal stenosis, lumbar region with neurogenic claudication: Secondary | ICD-10-CM

## 2020-03-24 MED ORDER — METHYLPREDNISOLONE ACETATE 40 MG/ML INJ SUSP (RADIOLOG
120.0000 mg | Freq: Once | INTRAMUSCULAR | Status: AC
  Administered 2020-03-24: 120 mg via EPIDURAL

## 2020-03-24 MED ORDER — IOPAMIDOL (ISOVUE-M 200) INJECTION 41%
1.0000 mL | Freq: Once | INTRAMUSCULAR | Status: AC
  Administered 2020-03-24: 1 mL via EPIDURAL

## 2020-03-24 NOTE — Telephone Encounter (Signed)
Called patient left message to return call to schedule an appointment with Dr dean for recheck right arm

## 2020-03-24 NOTE — Discharge Instructions (Signed)

## 2020-03-30 ENCOUNTER — Other Ambulatory Visit: Payer: Medicare PPO

## 2020-04-05 ENCOUNTER — Telehealth: Payer: Self-pay | Admitting: Orthopedic Surgery

## 2020-04-05 NOTE — Telephone Encounter (Signed)
Called patient per my chart message to cal back and schedule an appointment with Dr dean for right arm and hand pain

## 2020-04-06 ENCOUNTER — Ambulatory Visit: Payer: Medicare PPO | Admitting: Orthopedic Surgery

## 2020-04-06 DIAGNOSIS — M542 Cervicalgia: Secondary | ICD-10-CM

## 2020-04-06 DIAGNOSIS — G5621 Lesion of ulnar nerve, right upper limb: Secondary | ICD-10-CM | POA: Diagnosis not present

## 2020-04-07 ENCOUNTER — Encounter: Payer: Self-pay | Admitting: Orthopedic Surgery

## 2020-04-07 ENCOUNTER — Telehealth: Payer: Self-pay | Admitting: Orthopedic Surgery

## 2020-04-07 NOTE — Progress Notes (Signed)
Office Visit Note   Patient: Wendy Carrillo           Date of Birth: [birth year]           MRN: Wendy [id number] Visit Date: 04/06/2020 Requested by: Wendy Panda, MD [id number] [place],   [place] [address] PCP: Wendy Panda, MD  Subjective: Chief Complaint  Patient presents with  . Follow-up    HPI: Wendy Carrillo is a 79 year old patient with right arm pain as well as neck pain.  Taking gabapentin doing reasonably well with that.  She is going to see Dr. Saintclair Carrillo for cervical spine evaluation.  MRI scan of the lumbar spine is ordered for tomorrow to evaluate back and leg pain as well.  She states her writing has improved.  She cannot really write for a long time but overall it is improved compared to last clinic visit.  She is right-hand dominant.  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              ROS: All systems reviewed are negative as they relate to the chief complaint within the history of present illness.  Patient denies  fevers or chills.   Assessment & Plan: Visit Diagnoses:  1. Cervicalgia   2. Ulnar neuropathy of right upper extremity     Plan: Impression is improvement in right-sided ulnar neuropathy.  Writing endurance and dexterity has improved albeit marginally.  For now no indication for ulnar nerve decompression in situ.  I would see how she is doing clinically.  Currently she is improving but slowly.  She is avoiding pressure on the elbow region.  Follow-up with me as needed.  She may be heading for some back surgery with Dr. Saintclair Carrillo.  Follow-Up Instructions: No follow-ups on file.   Orders:  No orders of the defined types were placed in this encounter.  No orders of the defined types were placed in this encounter.     Procedures: No procedures performed   Clinical Data: No additional findings.  Objective: Vital Signs: There were no vitals taken for this visit.  Physical Exam:   Constitutional: Patient appears well-developed HEENT:    Head: Normocephalic Eyes:EOM are normal Neck: Normal range of motion Cardiovascular: Normal rate Pulmonary/chest: Effort normal Neurologic: Patient is alert Skin: Skin is warm Psychiatric: Patient has normal mood and affect    Ortho Exam: Ortho exam demonstrates full active and passive range of motion of the right elbow.  Does have interosseous wasting bilaterally but her interosseous strength is actually better than it was last clinic visit on the right-hand side.  Negative Tinel's cubital tunnel in the elbow right or left hand side.  No abductor pollicis brevis wasting on the right.  Does have some paresthesias in the ulnar distribution on the right-hand side.  Specialty Comments:  No specialty comments available.  Imaging: No results found.   PMFS History: Patient Active Problem List   Diagnosis Date Noted  . Spinal stenosis at L4-L5 level 05/18/2019  . Benign essential HTN   . History of total bilateral knee replacement (TKR)   . Acute blood loss anemia   . Post-operative pain   . Spinal stenosis of lumbar region 06/09/2018  . Spinal stenosis, lumbar   . Hypertension   . History of duodenal ulcer   . Hepatitis   . Gastritis   . Family history of adverse reaction to anesthesia   . Chronic lower back pain   . Arthritis   . Coronary artery  calcification 10/21/2017  . Combined forms of age-related cataract of right eye 04/18/2017  . Status post total bilateral knee replacement 06/08/2016  . Left lumbar radiculopathy 05/09/2016  . Bilateral shoulder pain 03/22/2016  . DDD (degenerative disc disease), cervical 03/22/2016  . Morbid obesity with BMI of 40.0-44.9, adult ([place]) 03/08/2016  . Spondylolisthesis, 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/2016  . Neck pain 03/08/2016  . Lumbar stenosis 03/08/2016  . Cervical spinal stenosis 03/08/2016  . Back pain 11/16/2015  . 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/10/2015  . Angina pectoris ([place]) 04/20/2015  . Morbid obesity ([place]) 04/20/2015  .  Dyspnea 04/20/2015  . Atypical chest pain 04/20/2015  . Esophageal reflux 02/23/2015  . Essential hypertension 02/23/2015  . Chronic back pain 02/23/2015  . Chest pain 02/22/2015  . Abdominal tenderness, LLQ (left lower quadrant) 06/15/2014  . Acute sinusitis 06/15/2014  . Allergic rhinitis due to pollen 06/15/2014  . Bladder disorder 06/15/2014  . Epistaxis 06/15/2014  . Female stress incontinence 06/15/2014  . 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 Insomnia 06/15/2014  . Localized primary osteoarthritis of wrist 06/15/2014  . Lumbosacral spondylosis 06/15/2014  . Osteoarthrosis, localized, primary, hand 06/15/2014  . Referred otalgia 06/15/2014  . Tingling 06/15/2014  . Tinnitus 06/15/2014  . Urinary incontinence 06/15/2014  . Cystocele 05/04/2014  . Retention of urine 05/04/2014  . Cystocele, lateral 04/12/2014  . Rectocele 03/17/2014  . History of blood transfusion 11/05/1990   Past Medical History:  Diagnosis Date  . Arthritis    "all over"  . Chronic lower back pain   . Coronary artery calcification 10/21/2017  . Family history of adverse reaction to anesthesia    "one of my son's passed out"  . Gastritis   . GERD (gastroesophageal reflux disease)   . 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 History of blood transfusion 1992   "related to knee replacement"  . History of duodenal ulcer   . Hypertension   . PONV (postoperative nausea and vomiting)   . Spinal headache   . Spinal stenosis, lumbar     Family History  Problem Relation Age of Onset  . Hypertension Mother   . Stroke Mother   . Diabetes Mother   . Heart attack Father   . Breast cancer Sister   . Leukemia Brother   . Breast cancer Sister   . Breast cancer Sister   . Heart disease Brother     Past Surgical History:  Procedure Laterality Date  . ABDOMINAL HERNIA REPAIR    . ANTERIOR AND POSTERIOR VAGINAL REPAIR  2015  . APPENDECTOMY  ?1980's  .  BLADDER SUSPENSION    . BREAST CYST EXCISION Bilateral   . BREAST SURGERY Left    "nipple taken off"  . CARDIAC CATHETERIZATION    . CARPAL TUNNEL RELEASE Bilateral 2010's  . CATARACT EXTRACTION W/ INTRAOCULAR LENS IMPLANT Left 2014  . CHOLECYSTECTOMY OPEN  1980's  . DILATION AND CURETTAGE OF UTERUS  "several"  . HERNIA REPAIR    . JOINT REPLACEMENT    . LEFT HEART CATH AND CORONARY ANGIOGRAPHY N/A 10/23/2017   Procedure: LEFT HEART CATH AND CORONARY ANGIOGRAPHY;  Surgeon: Troy Sine, MD;  Location: [facility];  Service: Cardiovascular;  Laterality: N/A;  . LUMBAR LAMINECTOMY/DECOMPRESSION MICRODISCECTOMY Bilateral 06/09/2018   Procedure: Laminectomy and Foraminotomy - Lumbar Two,Lumbar Three - Lumbar One- Two - Lumbar Three-Four- bilateral;  Surgeon: Kary Kos, MD;  Location: [facility];  Service: Neurosurgery;  Laterality: Bilateral;  . PATELLA FRACTURE SURGERY Left "after 1992"   "after the replacement"  . REDUCTION MAMMAPLASTY Bilateral   . REPLACEMENT TOTAL KNEE Bilateral    "left in 1992; right in 2001"  . VAGINAL HYSTERECTOMY  1972   Social History   Occupational History  . Not on file  Tobacco Use  . Smoking status: Never Smoker  . Smokeless tobacco: Never Used  Substance and Sexual Activity  . Alcohol use: No  . Drug use: No  . Sexual activity: Not on file

## 2020-04-07 NOTE — Telephone Encounter (Signed)
Pt would like to see if she could get referred for PT on her right arm? Pt would like a call back with the answer.   [phone]

## 2020-04-07 NOTE — Telephone Encounter (Signed)
Pls advise thanks.  

## 2020-04-08 NOTE — Telephone Encounter (Signed)
I'm happy to refer her but evidence suggests no benefit of PT for ulnar neuropathy. If she still wants to pursue therapy, refer upstairs for Right wrist/elbow ROM exercises with strengthening

## 2020-04-08 NOTE — Telephone Encounter (Signed)
IC s/w patient and advised. She will hold off at this time.

## 2020-04-13 ENCOUNTER — Other Ambulatory Visit (HOSPITAL_COMMUNITY): Payer: Self-pay | Admitting: Neurosurgery

## 2020-04-13 ENCOUNTER — Other Ambulatory Visit: Payer: Self-pay

## 2020-04-13 ENCOUNTER — Ambulatory Visit (HOSPITAL_COMMUNITY)
Admission: RE | Admit: 2020-04-13 | Discharge: 2020-04-13 | Disposition: A | Discharge status: Home / Self Care | Payer: Medicare PPO | Source: Ambulatory Visit | Attending: Vascular Surgery | Admitting: Vascular Surgery

## 2020-04-13 DIAGNOSIS — M7989 Other specified soft tissue disorders: Secondary | ICD-10-CM | POA: Diagnosis present

## 2020-04-15 ENCOUNTER — Other Ambulatory Visit: Payer: Self-pay | Admitting: Student

## 2020-04-15 DIAGNOSIS — M5416 Radiculopathy, lumbar region: Secondary | ICD-10-CM

## 2020-04-28 ENCOUNTER — Encounter (HOSPITAL_BASED_OUTPATIENT_CLINIC_OR_DEPARTMENT_OTHER): Payer: Self-pay | Admitting: Emergency Medicine

## 2020-04-28 ENCOUNTER — Other Ambulatory Visit: Payer: Self-pay

## 2020-04-28 ENCOUNTER — Emergency Department (HOSPITAL_BASED_OUTPATIENT_CLINIC_OR_DEPARTMENT_OTHER)
Admission: EM | Admit: 2020-04-28 | Discharge: 2020-04-28 | Disposition: A | Discharge status: Home / Self Care | Payer: Medicare PPO | Attending: Emergency Medicine | Admitting: Emergency Medicine

## 2020-04-28 ENCOUNTER — Emergency Department (HOSPITAL_BASED_OUTPATIENT_CLINIC_OR_DEPARTMENT_OTHER): Payer: Medicare PPO

## 2020-04-28 DIAGNOSIS — I10 Essential (primary) hypertension: Secondary | ICD-10-CM | POA: Insufficient documentation

## 2020-04-28 DIAGNOSIS — M542 Cervicalgia: Secondary | ICD-10-CM | POA: Diagnosis present

## 2020-04-28 DIAGNOSIS — M25562 Pain in left knee: Secondary | ICD-10-CM

## 2020-04-28 DIAGNOSIS — M5442 Lumbago with sciatica, left side: Secondary | ICD-10-CM | POA: Insufficient documentation

## 2020-04-28 DIAGNOSIS — Z91040 Latex allergy status: Secondary | ICD-10-CM | POA: Insufficient documentation

## 2020-04-28 MED ORDER — DOCUSATE SODIUM 100 MG PO CAPS
100.0000 mg | ORAL_CAPSULE | Freq: Two times a day (BID) | ORAL | 0 refills | Status: DC
Start: 2020-04-28 — End: 2020-06-04

## 2020-04-28 MED ORDER — OXYCODONE-ACETAMINOPHEN 5-325 MG PO TABS: 1.0000 | ORAL_TABLET | Freq: Four times a day (QID) | ORAL | 0 refills | Status: DC | PRN

## 2020-04-28 MED ORDER — ONDANSETRON HCL 4 MG PO TABS
4.0000 mg | ORAL_TABLET | Freq: Once | ORAL | Status: DC
  Filled 2020-04-28: qty 1

## 2020-04-28 MED ORDER — OXYCODONE-ACETAMINOPHEN 5-325 MG PO TABS
1.0000 | ORAL_TABLET | Freq: Once | ORAL | Status: AC
  Administered 2020-04-28: 1 via ORAL
  Filled 2020-04-28: qty 1

## 2020-04-28 MED ORDER — ONDANSETRON 4 MG PO TBDP
ORAL_TABLET | ORAL | Status: AC
  Administered 2020-04-28: 4 mg
  Filled 2020-04-28: qty 1

## 2020-04-28 NOTE — ED Provider Notes (Signed)
[facility]PART[facility] Provider Note   CSN: [id number] Arrival date & time: 04/28/20  1540     History Chief Complaint  Patient presents with  . Back Pain    Wendy Carrillo is a 79 y.o. female.  Who presents with bilateral knee and back pain.  She has a history of chronic bilateral knee pain and is 30 years out from bilateral knee replacements.  She is followed by Dr. Marlou Sa.  She states that her knees have been giving way when she tries to go up or down stairs but she has chronic pain in her knees whenever she ambulates.  She takes tramadol for pain which normally helps however a week ago she was trying to go up her steps.  Her left knee gave out and she twisted, falling towards the stairwell and was able to catch herself.  She has a history of previous back surgery with Dr. Saintclair Halsted.  She has chronic lumbar pain that radiates into her left leg.  After this event her knee pain and back pain have gotten severely worse.  She states that normally when she has an injury she can take her medications and after few days her pain will improve however her pain has been severe, unchanged with pain medication.  She is having difficulty sleeping and ambulating.  She is able to ambulate with a cane.  She denies any new weakness, numbness, saddle anesthesia.  HPI     Past Medical History:  Diagnosis Date  . Arthritis    "all over"  . Chronic lower back pain   . Coronary artery calcification 10/21/2017  . Family history of adverse reaction to anesthesia    "one of my son's passed out"  . Gastritis   . GERD (gastroesophageal reflux disease)   . 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 History of blood transfusion 1992   "related to knee replacement"  . History of duodenal ulcer   . Hypertension   . PONV (postoperative nausea and vomiting)   . Spinal headache   . Spinal stenosis, lumbar     Patient Active Problem List    Diagnosis Date Noted  . Spinal stenosis at L4-L5 level 05/18/2019  . Benign essential HTN   . History of total bilateral knee replacement (TKR)   . Acute blood loss anemia   . Post-operative pain   . Spinal stenosis of lumbar region 06/09/2018  . Spinal stenosis, lumbar   . Hypertension   . History of duodenal ulcer   . Hepatitis   . Gastritis   . Family history of adverse reaction to anesthesia   . Chronic lower back pain   . Arthritis   . Coronary artery calcification 10/21/2017  . Combined forms of age-related cataract of right eye 04/18/2017  . Status post total bilateral knee replacement 06/08/2016  . Left lumbar radiculopathy 05/09/2016  . Bilateral shoulder pain 03/22/2016  . DDD (degenerative disc disease), cervical 03/22/2016  . Morbid obesity with BMI of 40.0-44.9, adult ([place]) 03/08/2016  . Spondylolisthesis, 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/2016  . Neck pain 03/08/2016  . Lumbar stenosis 03/08/2016  . Cervical spinal stenosis 03/08/2016  . Back pain 11/16/2015  . 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/10/2015  . Angina pectoris ([place]) 04/20/2015  . Morbid obesity ([place]) 04/20/2015  . Dyspnea 04/20/2015  . Atypical chest pain 04/20/2015  . Esophageal reflux 02/23/2015  . Essential hypertension 02/23/2015  .  Chronic back pain 02/23/2015  . Chest pain 02/22/2015  . Abdominal tenderness, LLQ (left lower quadrant) 06/15/2014  . Acute sinusitis 06/15/2014  . Allergic rhinitis due to pollen 06/15/2014  . Bladder disorder 06/15/2014  . Epistaxis 06/15/2014  . Female stress incontinence 06/15/2014  . 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 Insomnia 06/15/2014  . Localized primary osteoarthritis of wrist 06/15/2014  . Lumbosacral spondylosis 06/15/2014  . Osteoarthrosis, localized, primary, hand 06/15/2014  . Referred otalgia 06/15/2014  . Tingling 06/15/2014  . Tinnitus 06/15/2014  . Urinary incontinence 06/15/2014  . Cystocele 05/04/2014  . Retention of urine 05/04/2014  . Cystocele,  lateral 04/12/2014  . Rectocele 03/17/2014  . History of blood transfusion 11/05/1990    Past Surgical History:  Procedure Laterality Date  . ABDOMINAL HERNIA REPAIR    . ANTERIOR AND POSTERIOR VAGINAL REPAIR  2015  . APPENDECTOMY  ?1980's  . BLADDER SUSPENSION    . BREAST CYST EXCISION Bilateral   . BREAST SURGERY Left    "nipple taken off"  . CARDIAC CATHETERIZATION    . CARPAL TUNNEL RELEASE Bilateral 2010's  . CATARACT EXTRACTION W/ INTRAOCULAR LENS IMPLANT Left 2014  . CHOLECYSTECTOMY OPEN  1980's  . DILATION AND CURETTAGE OF UTERUS  "several"  . HERNIA REPAIR    . JOINT REPLACEMENT    . LEFT HEART CATH AND CORONARY ANGIOGRAPHY N/A 10/23/2017   Procedure: LEFT HEART CATH AND CORONARY ANGIOGRAPHY;  Surgeon: Troy Sine, MD;  Location: [facility];  Service: Cardiovascular;  Laterality: N/A;  . LUMBAR LAMINECTOMY/DECOMPRESSION MICRODISCECTOMY Bilateral 06/09/2018   Procedure: Laminectomy and Foraminotomy - Lumbar Two,Lumbar Three - Lumbar One- Two - Lumbar Three-Four- bilateral;  Surgeon: Kary Kos, MD;  Location: [facility];  Service: Neurosurgery;  Laterality: Bilateral;  . PATELLA FRACTURE SURGERY Left "after 1992"   "after the replacement"  . REDUCTION MAMMAPLASTY Bilateral   . REPLACEMENT TOTAL KNEE Bilateral    "left in 1992; right in 2001"  . VAGINAL HYSTERECTOMY  1972     OB History   No obstetric history on file.     Family History  Problem Relation Age of Onset  . Hypertension Mother   . Stroke Mother   . Diabetes Mother   . Heart attack Father   . Breast cancer Sister   . Leukemia Brother   . Breast cancer Sister   . Breast cancer Sister   . Heart disease Brother     Social History   Tobacco Use  . Smoking status: Never Smoker  . Smokeless tobacco: Never Used  Vaping Use  . Vaping Use: Never used  Substance Use Topics  . Alcohol use: No  . Drug use: No    Home Medications Prior to Admission medications   Medication Sig Start Date End  Date Taking? Authorizing Provider  ALPRAZolam (XANAX) 0.25 MG tablet Take 1 tablet (0.25 mg total) by mouth daily as needed. Patient taking differently: Take 0.25 mg by mouth daily as needed for anxiety.  02/24/15   Rai, Vernelle Emerald, MD  celecoxib (CELEBREX) 200 MG capsule Take 200 mg by mouth daily.  05/16/10   [provider]  cetirizine (ZYRTEC) 10 MG tablet Take 10 mg by mouth daily.  01/24/15   [provider]  furosemide (LASIX) 20 MG tablet Take 20 mg by mouth daily.     [provider]  gabapentin (NEURONTIN) 300 MG capsule Take 300 mg by mouth 3 (three) times daily.     [provider]  meclizine (  ANTIVERT) 25 MG tablet  12/02/19   [provider]  pantoprazole (PROTONIX) 20 MG tablet Take 1 tablet (20 mg total) by mouth daily. Patient taking differently: Take 20 mg by mouth at bedtime.  08/27/16   Harlin Heys, MD  ramipril (ALTACE) 2.5 MG capsule Take 2.5 mg by mouth daily. 02/17/15   [provider]  tolterodine (DETROL LA) 4 MG 24 hr capsule Take 4 mg by mouth daily as needed for incontinence. 02/09/19   [provider]  traMADol HCl 100 MG TABS  01/27/20   [provider]  Vitamin D, Ergocalciferol, (DRISDOL) 50000 UNITS CAPS capsule Take 50,000 Units by mouth every Thursday.     [provider]    Allergies    Contrast media [iodinated diagnostic agents], Penicillins, Baclofen, Latex, and Other  Review of Systems   Review of Systems Ten systems reviewed and are negative for acute change, except as noted in the HPI.   Physical Exam Updated Vital Signs BP 140/70 (BP Location: Right Arm)   Pulse 72   Temp 98.5 F (36.9 C) (Oral)   Resp 18   Ht 5\' 4"  (1.626 m)   Wt 97.5 kg   SpO2 96%   BMI 36.90 kg/m   Physical Exam Vitals and nursing note reviewed.  Constitutional:      General: She is not in acute distress.    Appearance: She is well-developed. She is not diaphoretic.  HENT:     Head:  Normocephalic and atraumatic.  Eyes:     General: No scleral icterus.    Conjunctiva/sclera: Conjunctivae normal.  Cardiovascular:     Rate and Rhythm: Normal rate and regular rhythm.     Heart sounds: Normal heart sounds. No murmur heard.  No friction rub. No gallop.   Pulmonary:     Effort: Pulmonary effort is normal. No respiratory distress.     Breath sounds: Normal breath sounds.  Abdominal:     General: Bowel sounds are normal. There is no distension.     Palpations: Abdomen is soft. There is no mass.     Tenderness: There is no abdominal tenderness. There is no guarding.  Musculoskeletal:     Cervical back: Normal range of motion.     Comments: Bilateral midline surgical scars that are well-healed over the knees.  Left knee is exquisitely tender to palpation along the joint line passive and active range of motion is painful.  Moderate swelling bilaterally.  Negative straight leg test. No midline spinal tenderness.  Tender palpation in the left bilateral lumbar paraspinals worse on the left.  Skin:    General: Skin is warm and dry.  Neurological:     Mental Status: She is alert and oriented to person, place, and time.  Psychiatric:        Behavior: Behavior normal.     ED Results / Procedures / Treatments   Labs (all labs ordered are listed, but only abnormal results are displayed) Labs Reviewed - No data to display  EKG None  Radiology DG Lumbar Spine Complete  Result Date: 04/28/2020 CLINICAL DATA:  Back pain secondary to a fall 1 week ago. EXAM: LUMBAR SPINE - COMPLETE 4+ VIEW COMPARISON:  06/18/2019 and 09/07/2017 FINDINGS: There is no evidence of lumbar spine fracture. Chronic grade 1 spondylolisthesis at L4-5 due to facet arthritis. Disc space narrowing at L5-S1 and at L1-2 and T12-L1, chronic. Previous posterior decompression at L3 and L4. Mild chronic lumbar scoliosis with convexity to the right centered  at L3. IMPRESSION: No acute abnormality. Chronic  degenerative disc and joint disease as described. Chronic grade 1 spondylolisthesis at L4-5. Electronically Signed   By: Lorriane Shire M.D.   On: 04/28/2020 18:02   DG Knee 2 Views Left  Result Date: 04/28/2020 CLINICAL DATA:  Bilateral knee pain, left greater than right. Weakness. EXAM: LEFT KNEE - 1-2 VIEW COMPARISON:  Radiographs dated 11/28/2018 FINDINGS: No evidence of fracture, dislocation, or joint effusion. The components of the total knee prosthesis demonstrate no evidence of loosening. No appreciable joint effusion. IMPRESSION: No acute abnormality. Electronically Signed   By: Lorriane Shire M.D.   On: 04/28/2020 18:03   DG Knee 2 Views Right  Result Date: 04/28/2020 CLINICAL DATA:  Knee pain and weakness. The patient fell 1 week ago. EXAM: RIGHT KNEE - 1-2 VIEW COMPARISON:  01/29/2018 FINDINGS: No evidence of fracture, dislocation, or joint effusion. The components of the total knee prosthesis demonstrate no evidence of loosening. No appreciable joint effusion. IMPRESSION: No acute abnormality. Electronically Signed   By: Lorriane Shire M.D.   On: 04/28/2020 18:05    Procedures Procedures (including critical care time)  Medications Ordered in ED Medications - No data to display  ED Course  I have reviewed the triage vital signs and the nursing notes.  Pertinent labs & imaging results that were available during my care of the patient were reviewed by me and considered in my medical decision making (see chart for details).    MDM Rules/Calculators/A&P                          PDMP reviewed during this encounter.  79 year old female who presents to the emergency department with exacerbation of her chronic knee and back pain. I ordered bilateral knee and lumbar spine images and personally reviewed these images which show no acute abnormalities. The patient is ambulatory but having severe discomfort on her regularly dosed tramadol. I reviewed the drug database. Patient will go home  with increased pain control using 1 Percocet every 4 to 6 hours for pain relief. She is advised to start a stool softener. She has established relationships with Dr. Saintclair Halsted of neurosurgery and Dr. Marlou Sa of orthopedics and is advised to make very close follow-up appointments with both of these physicians. She has no evidence of acute fracture or severe peripheral neurologic impairment from her injury. No concern for cauda equina. The patient is ambulatory with her cane. Pain improved in the emergency department. She appears appropriate for discharge with current Patient seen in shared visit with attending physician. Who agrees with assessment, work up , treatment, and plan for discharge   Final Clinical Impression(s) / ED Diagnoses Final diagnoses:  None    Rx / DC Orders ED Discharge Orders    None       Margarita Mail, PA-C 04/29/20 1256    Malvin Johns, MD 05/02/20 1404

## 2020-04-28 NOTE — ED Notes (Signed)
Pt c/o back pain after knees gave away wednesday  Also bilateral knee pain and weakness  States knees give away when trying to wals

## 2020-04-28 NOTE — Discharge Instructions (Addendum)
Do not take tramadol with the Percocet I prescribed.  You may take this medication for severe pain.  This can cause constipation I have ordered a stool softener for you to take.  Do not take this medication if it causes you to become confused, have hallucinations or have severe vomiting.  Please follow closely with Dr. Marlou Sa and Dr. Saintclair Halsted regarding your knee and back pain. Contact a health care provider if: You have pain that is not relieved with rest or medicine. You have increasing pain going down into your legs or buttocks. Your pain does not improve after 2 weeks. You have pain at night. You lose weight without trying. You have a fever or chills. Get help right away if: You develop new bowel or bladder control problems. You have unusual weakness or numbness in your arms or legs. You develop nausea or vomiting. You develop abdominal pain. You feel faint.

## 2020-04-28 NOTE — ED Triage Notes (Signed)
Low back pain radiating down both legs. States she had a fall 1 week ago.

## 2020-05-02 ENCOUNTER — Telehealth: Payer: Self-pay | Admitting: Orthopedic Surgery

## 2020-05-02 NOTE — Telephone Encounter (Signed)
Appt scheduled for 06/29

## 2020-05-02 NOTE — Telephone Encounter (Signed)
Patient would like to be called if Dr. Marlou Sa has any open times earlier than 05/11/2020. Her call back number is [phone]

## 2020-05-03 ENCOUNTER — Ambulatory Visit: Payer: Medicare PPO | Admitting: Orthopedic Surgery

## 2020-05-03 DIAGNOSIS — M25562 Pain in left knee: Secondary | ICD-10-CM | POA: Diagnosis not present

## 2020-05-05 ENCOUNTER — Encounter: Payer: Self-pay | Admitting: Orthopedic Surgery

## 2020-05-05 NOTE — Progress Notes (Signed)
Office Visit Note   Patient: Wendy Carrillo           Date of Birth: January 22, 1941           MRN: [id number] Visit Date: 05/03/2020 Requested by: Jilda Panda, MD [id number] [place],   [address] PCP: Jilda Panda, MD  Subjective: Chief Complaint  Patient presents with  . Left Knee - Pain    HPI: Pablo is a patient with left knee pain and leg weakness.  She reports left and right legs giving way.  States she is having episodes of falling.  She had cemented total knee replacement done about 20 years ago.  MRI of the lumbar spine done about a year ago showed left greater than right foraminal stenosis at L2-3 and L3-4 which was worse appearing on subsequent CT scan.  Not much disc space at L1-2.  She also had an MRI scan about 2 weeks ago which we do not have access to.  This is ordered by her neurosurgeon.              ROS: All systems reviewed are negative as they relate to the chief complaint within the history of present illness.  Patient denies  fevers or chills.   Assessment & Plan: Visit Diagnoses:  1. Left knee pain, unspecified chronicity     Plan: Impression is left leg weakness with well-functioning total knee replacement.  She has good extension bilaterally but is having difficulty actually standing up with her body weight on on a stair.  Hard for her to lift up and actually ascend 1 stool in the exam room.  I think that this weakness she is having and giving way episodes is really not related to any structural or functional problem with the knee replacement but is more related to weakness coming from her back.  She is going to have to decide if she wants to get that addressed surgically.  Follow-up with [place] as needed.  Follow-Up Instructions: Return if symptoms worsen or fail to improve.   Orders:  No orders of the defined types were placed in this encounter.  No orders of the defined types were placed in this encounter.     Procedures: No procedures  performed   Clinical Data: No additional findings.  Objective: Vital Signs: There were no vitals taken for this visit.  Physical Exam:   Constitutional: Patient appears well-developed HEENT:  Head: Normocephalic Eyes:EOM are normal Neck: Normal range of motion Cardiovascular: Normal rate Pulmonary/chest: Effort normal Neurologic: Patient is alert Skin: Skin is warm Psychiatric: Patient has normal mood and affect    Ortho Exam: Ortho exam demonstrates 5 out of 5 ankle dorsiflexion plantarflexion quad hamstring strength.  Hip flexion strength a little weaker on the left compared to the right.  No groin pain with internal extra rotation of the leg.  Patient has pretty reasonable stability of both knees to varus valgus stress at 0 30 and 90 degrees.  No definite paresthesias L1 S1 bilaterally.  No effusion in either knee.  Extensor mechanism is intact.  She cannot stand up on once step leading with the left foot.  Specialty Comments:  No specialty comments available.  Imaging: No results found.   PMFS History: Patient Active Problem List   Diagnosis Date Noted  . Spinal stenosis at L4-L5 level 05/18/2019  . Benign essential HTN   . History of total bilateral knee replacement (TKR)   . Acute blood loss anemia   . Post-operative  pain   . Spinal stenosis of lumbar region 06/09/2018  . Spinal stenosis, lumbar   . Hypertension   . History of duodenal ulcer   . Hepatitis   . Gastritis   . Family history of adverse reaction to anesthesia   . Chronic lower back pain   . Arthritis   . Coronary artery calcification 10/21/2017  . Combined forms of age-related cataract of right eye 04/18/2017  . Status post total bilateral knee replacement 06/08/2016  . Left lumbar radiculopathy 05/09/2016  . Bilateral shoulder pain 03/22/2016  . DDD (degenerative disc disease), cervical 03/22/2016  . Morbid obesity with BMI of 40.0-44.9, adult ([place]) 03/08/2016  . Spondylolisthesis, lumbar  region 03/08/2016  . Neck pain 03/08/2016  . Lumbar stenosis 03/08/2016  . Cervical spinal stenosis 03/08/2016  . Back pain 11/16/2015  . 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/10/2015  . Angina pectoris ([place]) 04/20/2015  . Morbid obesity ([place]) 04/20/2015  . Dyspnea 04/20/2015  . Atypical chest pain 04/20/2015  . Esophageal reflux 02/23/2015  . Essential hypertension 02/23/2015  . Chronic back pain 02/23/2015  . Chest pain 02/22/2015  . Abdominal tenderness, LLQ (left lower quadrant) 06/15/2014  . Acute sinusitis 06/15/2014  . Allergic rhinitis due to pollen 06/15/2014  . Bladder disorder 06/15/2014  . Epistaxis 06/15/2014  . Female stress incontinence 06/15/2014  . 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 Insomnia 06/15/2014  . Localized primary osteoarthritis of wrist 06/15/2014  . Lumbosacral spondylosis 06/15/2014  . Osteoarthrosis, localized, primary, hand 06/15/2014  . Referred otalgia 06/15/2014  . Tingling 06/15/2014  . Tinnitus 06/15/2014  . Urinary incontinence 06/15/2014  . Cystocele 05/04/2014  . Retention of urine 05/04/2014  . Cystocele, lateral 04/12/2014  . Rectocele 03/17/2014  . History of blood transfusion 11/05/1990   Past Medical History:  Diagnosis Date  . Arthritis    "all over"  . Chronic lower back pain   . Coronary artery calcification 10/21/2017  . Family history of adverse reaction to anesthesia    "one of my son's passed out"  . Gastritis   . GERD (gastroesophageal reflux disease)   . 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 History of blood transfusion 1992   "related to knee replacement"  . History of duodenal ulcer   . Hypertension   . PONV (postoperative nausea and vomiting)   . Spinal headache   . Spinal stenosis, lumbar     Family History  Problem Relation Age of Onset  . Hypertension Mother   . Stroke Mother   . Diabetes Mother   . Heart attack Father   . Breast cancer Sister   .  Leukemia Brother   . Breast cancer Sister   . Breast cancer Sister   . Heart disease Brother     Past Surgical History:  Procedure Laterality Date  . ABDOMINAL HERNIA REPAIR    . ANTERIOR AND POSTERIOR VAGINAL REPAIR  2015  . APPENDECTOMY  ?1980's  . BLADDER SUSPENSION    . BREAST CYST EXCISION Bilateral   . BREAST SURGERY Left    "nipple taken off"  . CARDIAC CATHETERIZATION    . CARPAL TUNNEL RELEASE Bilateral 2010's  . CATARACT EXTRACTION W/ INTRAOCULAR LENS IMPLANT Left 2014  . CHOLECYSTECTOMY OPEN  1980's  . DILATION AND CURETTAGE OF UTERUS  "several"  . HERNIA REPAIR    . JOINT REPLACEMENT    . LEFT HEART CATH AND CORONARY ANGIOGRAPHY N/A 10/23/2017  Procedure: LEFT HEART CATH AND CORONARY ANGIOGRAPHY;  Surgeon: Troy Sine, MD;  Location: [facility];  Service: Cardiovascular;  Laterality: N/A;  . LUMBAR LAMINECTOMY/DECOMPRESSION MICRODISCECTOMY Bilateral 06/09/2018   Procedure: Laminectomy and Foraminotomy - Lumbar Two,Lumbar Three - Lumbar One- Two - Lumbar Three-Four- bilateral;  Surgeon: Kary Kos, MD;  Location: [facility];  Service: Neurosurgery;  Laterality: Bilateral;  . PATELLA FRACTURE SURGERY Left "after 1992"   "after the replacement"  . REDUCTION MAMMAPLASTY Bilateral   . REPLACEMENT TOTAL KNEE Bilateral    "left in 1992; right in 2001"  . VAGINAL HYSTERECTOMY  1972   Social History   Occupational History  . Not on file  Tobacco Use  . Smoking status: Never Smoker  . Smokeless tobacco: Never Used  Vaping Use  . Vaping Use: Never used  Substance and Sexual Activity  . Alcohol use: No  . Drug use: No  . Sexual activity: Not on file

## 2020-05-11 ENCOUNTER — Ambulatory Visit: Payer: Medicare PPO | Admitting: Orthopedic Surgery

## 2020-05-20 ENCOUNTER — Other Ambulatory Visit: Payer: Self-pay | Admitting: Neurosurgery

## 2020-05-29 NOTE — Pre-Procedure Instructions (Signed)
[id number] [place],  - [address]. AT [address]. [place] [address] Phone: [phone] Fax: [phone]     Your procedure is scheduled on Wednesday, July 28, from 12:42 PM- 2:50 PM.  Report to [facility] Main Entrance "A" at 10:40 A.M., and check in at the Admitting office.  Call this number if you have problems the morning of surgery:  [phone]  Call [phone] if you have any questions prior to your surgery date Monday-Friday 8am-4pm.    Remember:  Do not eat or drink after midnight the night before your surgery.    Take these medicines the morning of surgery with A SIP OF WATER: gabapentin (NEURONTIN)  IF NEEDED: ALPRAZolam (XANAX) meclizine (ANTIVERT)  traMADol HCl  As of today, STOP taking any Aspirin (unless otherwise instructed by your surgeon), celecoxib (CELEBREX), Aleve, Naproxen, Ibuprofen, Motrin, Advil, Goody's, BC's, all herbal medications, fish oil, and all vitamins.         The Morning of Surgery:             Do not wear jewelry, make up, or nail polish.            Do not wear lotions, powders, perfumes, or deodorant.            Do not shave 48 hours prior to surgery.              Do not bring valuables to the hospital.            [facility] is not responsible for any belongings or valuables.  Do NOT Smoke (Tobacco/Vaping) or drink Alcohol 24 hours prior to your procedure If you use a CPAP at night, you may bring all equipment for your overnight stay.   Contacts, glasses, dentures or bridgework may not be worn into surgery.      For patients admitted to the hospital, discharge time will be determined by your treatment team.   Patients discharged the day of surgery will not be allowed to drive home, and someone needs to stay with them for 24 hours.    Special instructions:   [facility]- Preparing For Surgery  Before surgery, you can play an important role. Because skin is not sterile,  your skin needs to be as free of germs as possible. You can reduce the number of germs on your skin by washing with CHG (chlorahexidine gluconate) Soap before surgery.  CHG is an antiseptic cleaner which kills germs and bonds with the skin to continue killing germs even after washing.    Oral Hygiene is also important to reduce your risk of infection.  Remember - BRUSH YOUR TEETH THE MORNING OF SURGERY WITH YOUR REGULAR TOOTHPASTE  Please do not use if you have an allergy to CHG or antibacterial soaps. If your skin becomes reddened/irritated stop using the CHG.  Do not shave (including legs and underarms) for at least 48 hours prior to first CHG shower. It is OK to shave your face.  Please follow these instructions carefully.   1. Shower the NIGHT BEFORE SURGERY and the MORNING OF SURGERY with CHG Soap.   2. If you chose to wash your hair, wash your hair first as usual with your normal shampoo.  3. After you shampoo, rinse your hair and body thoroughly to remove the shampoo.  4. Use CHG as you would any other liquid soap. You can apply CHG directly to the skin and wash gently  with a scrungie or a clean washcloth.   5. Apply the CHG Soap to your body ONLY FROM THE NECK DOWN.  Do not use on open wounds or open sores. Avoid contact with your eyes, ears, mouth and genitals (private parts). Wash Face and genitals (private parts)  with your normal soap.   6. Wash thoroughly, paying special attention to the area where your surgery will be performed.  7. Thoroughly rinse your body with warm water from the neck down.  8. DO NOT shower/wash with your normal soap after using and rinsing off the CHG Soap.  9. Pat yourself dry with a CLEAN TOWEL.  10. Wear CLEAN PAJAMAS to bed the night before surgery  11. Place CLEAN SHEETS on your bed the night of your first shower and DO NOT SLEEP WITH PETS.   Day of Surgery: Wear Clean/Comfortable clothing the morning of surgery. Do not apply any  deodorants/lotions.   Remember to brush your teeth WITH YOUR REGULAR TOOTHPASTE.   Please read over the following fact sheets that you were given.

## 2020-05-30 ENCOUNTER — Encounter (HOSPITAL_COMMUNITY)
Admission: RE | Admit: 2020-05-30 | Discharge: 2020-05-30 | Disposition: A | Discharge status: Home / Self Care | Payer: Medicare PPO | Source: Ambulatory Visit | Attending: Neurosurgery | Admitting: Neurosurgery

## 2020-05-30 ENCOUNTER — Encounter (HOSPITAL_COMMUNITY): Payer: Self-pay

## 2020-05-30 ENCOUNTER — Other Ambulatory Visit: Payer: Self-pay

## 2020-05-30 ENCOUNTER — Other Ambulatory Visit (HOSPITAL_COMMUNITY)
Admission: RE | Admit: 2020-05-30 | Discharge: 2020-05-30 | Disposition: A | Discharge status: Home / Self Care | Payer: Medicare PPO | Source: Ambulatory Visit | Attending: Neurosurgery | Admitting: Neurosurgery

## 2020-05-30 DIAGNOSIS — Z01812 Encounter for preprocedural laboratory examination: Secondary | ICD-10-CM | POA: Diagnosis present

## 2020-05-30 DIAGNOSIS — Z20822 Contact with and (suspected) exposure to covid-19: Secondary | ICD-10-CM | POA: Insufficient documentation

## 2020-05-30 HISTORY — DX: Presence of spectacles and contact lenses: Z97.3

## 2020-05-30 HISTORY — DX: Presence of dental prosthetic device (complete) (partial): Z97.2

## 2020-05-30 LAB — BASIC METABOLIC PANEL
Anion gap: 7 (ref 5–15)
BUN: 9 mg/dL (ref 8–23)
CO2: 28 mmol/L (ref 22–32)
Calcium: 9 mg/dL (ref 8.9–10.3)
Chloride: 103 mmol/L (ref 98–111)
Creatinine, Ser: 0.81 mg/dL (ref 0.44–1.00)
GFR calc Af Amer: >60 mL/min (ref >60)
GFR calc non Af Amer: >60 mL/min (ref >60)
Glucose, Bld: 96 mg/dL (ref 70–99)
Potassium: 3.6 mmol/L (ref 3.5–5.1)
Sodium: 138 mmol/L (ref 135–145)

## 2020-05-30 LAB — CBC
HCT: 42.1 % (ref 36.0–46.0)
Hemoglobin: 13.1 g/dL (ref 12.0–15.0)
MCH: 29.5 pg (ref 26.0–34.0)
MCHC: 31.1 g/dL (ref 30.0–36.0)
MCV: 94.8 fL (ref 80.0–100.0)
Platelets: 222 10*3/uL (ref 150–400)
RBC: 4.44 MIL/uL (ref 3.87–5.11)
RDW: 12.8 % (ref 11.5–15.5)
WBC: 5.5 10*3/uL (ref 4.0–10.5)
nRBC: 0 % (ref 0.0–0.2)

## 2020-05-30 LAB — SURGICAL PCR SCREEN
MRSA, PCR: NEGATIVE
Staphylococcus aureus: POSITIVE — AB

## 2020-05-30 LAB — SARS CORONAVIRUS 2 (TAT 6-24 HRS): SARS Coronavirus 2: NEGATIVE

## 2020-05-30 NOTE — Progress Notes (Signed)
Pt denies SOB and chest pain. Pt stated that she is under the care of Dr. Mellody Drown, PCP and Dr. Skeet Latch, Cardiology. Pt stated that she has not seen her cardiologist in 2 years. Nurse called PCP and requested that Coralyn Mark, [employer], fax over pt recent EKG and labs; on chart. Pt denies having a chest x ray. Pt reminded to quarantine. Pt verbalized understanding of all pre-op instructions. Pt chart forwarded to PA, Anesthesiology, for review.

## 2020-05-30 NOTE — Pre-Procedure Instructions (Signed)
[id number] [place], [place] - [address]. AT [address]. [place] [address] Phone: [phone] Fax: [phone]     Your procedure is scheduled on Wednesday, July 28, from 12:42 PM- 2:50 PM.  Report to [facility] Main Entrance "A" at 10:40 A.M., and check in at the Admitting office.  Call this number if you have problems the morning of surgery:  [phone]  Call [phone] if you have any questions prior to your surgery date Monday-Friday 8am-4pm.    Remember:  Do not eat or drink after midnight the night before your surgery.    Take these medicines the morning of surgery with A SIP OF WATER: gabapentin (NEURONTIN) Loratadine ( Claritin )  IF NEEDED: ALPRAZolam (XANAX) meclizine (ANTIVERT)  traMADol HCl  As of today, STOP taking any Aspirin (unless otherwise instructed by your surgeon), celecoxib (CELEBREX), Aleve, Naproxen, Ibuprofen, Motrin, Advil, Goody's, BC's, all herbal medications, fish oil, and all vitamins.         The Morning of Surgery:             Do not wear jewelry, make up, or nail polish.            Do not wear lotions, powders, perfumes, or deodorant.            Do not shave 48 hours prior to surgery.              Do not bring valuables to the hospital.            [facility] is not responsible for any belongings or valuables.  Do NOT Smoke (Tobacco/Vaping) or drink Alcohol 24 hours prior to your procedure If you use a CPAP at night, you may bring all equipment for your overnight stay.   Contacts, glasses, dentures or bridgework may not be worn into surgery.      For patients admitted to the hospital, discharge time will be determined by your treatment team.   Patients discharged the day of surgery will not be allowed to drive home, and someone needs to stay with them for 24 hours.    Special instructions:   [facility]- Preparing For Surgery  Before surgery, you can play an important role.  Because skin is not sterile, your skin needs to be as free of germs as possible. You can reduce the number of germs on your skin by washing with CHG (chlorahexidine gluconate) Soap before surgery.  CHG is an antiseptic cleaner which kills germs and bonds with the skin to continue killing germs even after washing.    Oral Hygiene is also important to reduce your risk of infection.  Remember - BRUSH YOUR TEETH THE MORNING OF SURGERY WITH YOUR REGULAR TOOTHPASTE  Please do not use if you have an allergy to CHG or antibacterial soaps. If your skin becomes reddened/irritated stop using the CHG.  Do not shave (including legs and underarms) for at least 48 hours prior to first CHG shower. It is OK to shave your face.  Please follow these instructions carefully.   1. Shower the NIGHT BEFORE SURGERY and the MORNING OF SURGERY with CHG Soap.   2. If you chose to wash your hair, wash your hair first as usual with your normal shampoo.  3. After you shampoo, rinse your hair and body thoroughly to remove the shampoo.  4. Use CHG as you would any other liquid soap. You can apply CHG directly to the  skin and wash gently with a scrungie or a clean washcloth.   5. Apply the CHG Soap to your body ONLY FROM THE NECK DOWN.  Do not use on open wounds or open sores. Avoid contact with your eyes, ears, mouth and genitals (private parts). Wash Face and genitals (private parts)  with your normal soap.   6. Wash thoroughly, paying special attention to the area where your surgery will be performed.  7. Thoroughly rinse your body with warm water from the neck down.  8. DO NOT shower/wash with your normal soap after using and rinsing off the CHG Soap.  9. Pat yourself dry with a CLEAN TOWEL.  10. Wear CLEAN PAJAMAS to bed the night before surgery  11. Place CLEAN SHEETS on your bed the night of your first shower and DO NOT SLEEP WITH PETS.   Day of Surgery: Wear Clean/Comfortable clothing the morning of  surgery. Do not apply any deodorants/lotions.   Remember to brush your teeth WITH YOUR REGULAR TOOTHPASTE.   Please read over the following fact sheets that you were given.

## 2020-05-31 MED ORDER — VANCOMYCIN HCL 1500 MG/300ML IV SOLN
1500.0000 mg | INTRAVENOUS | Status: AC
  Administered 2020-06-01: 1500 mg via INTRAVENOUS
  Filled 2020-05-31: qty 300

## 2020-05-31 NOTE — Anesthesia Preprocedure Evaluation (Addendum)
Anesthesia Evaluation  Patient identified by MRN, date of birth, ID band Patient awake    Reviewed: Allergy & Precautions, NPO status , Patient's Chart, lab work & pertinent test results  History of Anesthesia Complications (+) PONV, POST - OP SPINAL HEADACHE, Family history of anesthesia reaction and history of anesthetic complications  Airway Mallampati: II  TM Distance: >3 FB Neck ROM: Full    Dental  (+) Dental Advisory Given, Partial Upper   Pulmonary neg pulmonary ROS,    Pulmonary exam normal breath sounds clear to auscultation       Cardiovascular hypertension, Pt. on medications + CAD  Normal cardiovascular exam Rhythm:Regular Rate:Normal     Neuro/Psych  Headaches,  Neuromuscular disease    GI/Hepatic Neg liver ROS, GERD  Medicated,  Endo/Other  Obesity   Renal/GU negative Renal ROS     Musculoskeletal  (+) Arthritis ,   Abdominal   Peds  Hematology negative hematology ROS (+)   Anesthesia Other Findings Day of surgery medications reviewed with the patient.  Reproductive/Obstetrics                            Anesthesia Physical Anesthesia Plan  ASA: III  Anesthesia Plan: General   Post-op Pain Management:    Induction: Intravenous  PONV Risk Score and Plan: 4 or greater and Dexamethasone, Ondansetron and Treatment may vary due to age or medical condition  Airway Management Planned: Oral ETT  Additional Equipment:   Intra-op Plan:   Post-operative Plan: Extubation in OR  Informed Consent: I have reviewed the patients History and Physical, chart, labs and discussed the procedure including the risks, benefits and alternatives for the proposed anesthesia with the patient or authorized representative who has indicated his/her understanding and acceptance.     Dental advisory given  Plan Discussed with: CRNA  Anesthesia Plan Comments: (PAT note by Karoline Caldwell,  PA-C: Patient had cardiac eval in 2018 for atypical chest pain.  She ultimately underwent LHC 10/23/2017 which showed mild nonobstructive disease.  Medical therapy was recommended.  Echo 2019 was normal, EF 55 to 60%, normal wall motion, normal valves.  Preop labs reviewed, WNL.  EKG 09/21/2019 (copy on chart): Sinus bradycardia.  Rate 59.  Nonspecific T abnormality.  TTE 11/08/2017: - Left ventricle: The cavity size was normal. Systolic function was  normal. The estimated ejection fraction was in the range of 55%  to 60%. Wall motion was normal; there were no regional wall  motion abnormalities. Left ventricular diastolic function  parameters were normal.  - Left atrium: The atrium was mildly dilated.   LHC 10/23/2017: There is mild coronary calcification of all 3 major coronary arteries without significant obstructive  disease.    Dominant right coronary system.  LVEDP 13 mmHg.  RECOMMENDATION: Medical therapy with aggressive lipid-lowering treatment in light of subclinical atherosclerosis. )       Anesthesia Quick Evaluation

## 2020-05-31 NOTE — Progress Notes (Signed)
Anesthesia Chart Review:  Patient had cardiac eval in 2018 for atypical chest pain.  She ultimately underwent LHC 10/23/2017 which showed mild nonobstructive disease.  Medical therapy was recommended.  Echo 2019 was normal, EF 55 to 60%, normal wall motion, normal valves.  Preop labs reviewed, WNL.  EKG 09/21/2019 (copy on chart): Sinus bradycardia.  Rate 59.  Nonspecific T abnormality.  TTE 11/08/2017: - Left ventricle: The cavity size was normal. Systolic function was  normal. The estimated ejection fraction was in the range of 55%  to 60%. Wall motion was normal; there were no regional wall  motion abnormalities. Left ventricular diastolic function  parameters were normal.  - Left atrium: The atrium was mildly dilated.   LHC 10/23/2017: There is mild coronary calcification of all 3 major coronary arteries without significant obstructive  disease.    Dominant right coronary system.  LVEDP 13 mmHg.  RECOMMENDATION: Medical therapy with aggressive lipid-lowering treatment in light of subclinical atherosclerosis.   Wynonia Musty [facility] Short Stay Center/Anesthesiology Phone [phone] 05/31/2020 2:08 PM

## 2020-06-01 ENCOUNTER — Encounter (HOSPITAL_COMMUNITY): Payer: Self-pay | Admitting: Neurosurgery

## 2020-06-01 ENCOUNTER — Inpatient Hospital Stay (HOSPITAL_COMMUNITY)
Admission: AD | Admit: 2020-06-01 | Discharge: 2020-06-04 | DRG: 517 | Disposition: A | Discharge status: Skilled Nursing Facility | Payer: Medicare Other | Attending: Neurosurgery | Admitting: Neurosurgery

## 2020-06-01 ENCOUNTER — Ambulatory Visit (HOSPITAL_COMMUNITY): Payer: Medicare Other

## 2020-06-01 ENCOUNTER — Other Ambulatory Visit: Payer: Self-pay

## 2020-06-01 ENCOUNTER — Ambulatory Visit (HOSPITAL_COMMUNITY): Payer: Medicare Other | Admitting: Certified Registered"

## 2020-06-01 ENCOUNTER — Ambulatory Visit (HOSPITAL_COMMUNITY): Payer: Medicare Other | Admitting: Physician Assistant

## 2020-06-01 ENCOUNTER — Encounter (HOSPITAL_COMMUNITY)
Admission: AD | Disposition: A | Discharge status: Skilled Nursing Facility | Payer: Self-pay | Source: Home / Self Care | Attending: Neurosurgery

## 2020-06-01 DIAGNOSIS — M5416 Radiculopathy, lumbar region: Secondary | ICD-10-CM | POA: Diagnosis present

## 2020-06-01 DIAGNOSIS — M48061 Spinal stenosis, lumbar region without neurogenic claudication: Secondary | ICD-10-CM | POA: Diagnosis not present

## 2020-06-01 DIAGNOSIS — I10 Essential (primary) hypertension: Secondary | ICD-10-CM | POA: Diagnosis not present

## 2020-06-01 DIAGNOSIS — Z888 Allergy status to other drugs, medicaments and biological substances status: Secondary | ICD-10-CM | POA: Diagnosis not present

## 2020-06-01 DIAGNOSIS — Z8711 Personal history of peptic ulcer disease: Secondary | ICD-10-CM

## 2020-06-01 DIAGNOSIS — Z91041 Radiographic dye allergy status: Secondary | ICD-10-CM | POA: Diagnosis not present

## 2020-06-01 DIAGNOSIS — K219 Gastro-esophageal reflux disease without esophagitis: Secondary | ICD-10-CM | POA: Diagnosis not present

## 2020-06-01 DIAGNOSIS — I2510 Atherosclerotic heart disease of native coronary artery without angina pectoris: Secondary | ICD-10-CM | POA: Diagnosis not present

## 2020-06-01 DIAGNOSIS — Z880 Allergy status to penicillin: Secondary | ICD-10-CM | POA: Diagnosis not present

## 2020-06-01 DIAGNOSIS — Z91040 Latex allergy status: Secondary | ICD-10-CM

## 2020-06-01 DIAGNOSIS — Z96653 Presence of artificial knee joint, bilateral: Secondary | ICD-10-CM | POA: Diagnosis not present

## 2020-06-01 DIAGNOSIS — Z9842 Cataract extraction status, left eye: Secondary | ICD-10-CM

## 2020-06-01 DIAGNOSIS — Z20822 Contact with and (suspected) exposure to covid-19: Secondary | ICD-10-CM | POA: Diagnosis not present

## 2020-06-01 DIAGNOSIS — Z981 Arthrodesis status: Secondary | ICD-10-CM

## 2020-06-01 DIAGNOSIS — Z8249 Family history of ischemic heart disease and other diseases of the circulatory system: Secondary | ICD-10-CM

## 2020-06-01 DIAGNOSIS — Z79899 Other long term (current) drug therapy: Secondary | ICD-10-CM

## 2020-06-01 DIAGNOSIS — Z823 Family history of stroke: Secondary | ICD-10-CM | POA: Diagnosis not present

## 2020-06-01 DIAGNOSIS — Z961 Presence of intraocular lens: Secondary | ICD-10-CM | POA: Diagnosis present

## 2020-06-01 DIAGNOSIS — Z833 Family history of diabetes mellitus: Secondary | ICD-10-CM | POA: Diagnosis not present

## 2020-06-01 DIAGNOSIS — Z419 Encounter for procedure for purposes other than remedying health state, unspecified: Secondary | ICD-10-CM

## 2020-06-01 HISTORY — PX: LUMBAR LAMINECTOMY/DECOMPRESSION MICRODISCECTOMY: SHX5026

## 2020-06-01 SURGERY — LUMBAR LAMINECTOMY/DECOMPRESSION MICRODISCECTOMY 2 LEVELS
Anesthesia: General | Site: Back | Laterality: Bilateral

## 2020-06-01 MED ORDER — SODIUM CHLORIDE 0.9 % IV SOLN
INTRAVENOUS | Status: DC | PRN
  Administered 2020-06-01: 500 mL

## 2020-06-01 MED ORDER — ONDANSETRON HCL 4 MG/2ML IJ SOLN: 4.0000 mg | Freq: Four times a day (QID) | INTRAMUSCULAR | Status: DC | PRN

## 2020-06-01 MED ORDER — DEXAMETHASONE SODIUM PHOSPHATE 10 MG/ML IJ SOLN
INTRAMUSCULAR | Status: DC | PRN
  Administered 2020-06-01: 5 mg via INTRAVENOUS

## 2020-06-01 MED ORDER — PROPOFOL 10 MG/ML IV BOLUS
INTRAVENOUS | Status: DC | PRN
  Administered 2020-06-01: 120 mg via INTRAVENOUS

## 2020-06-01 MED ORDER — THROMBIN 5000 UNITS EX SOLR
CUTANEOUS | Status: DC | PRN
  Administered 2020-06-01: 10000 [IU] via TOPICAL

## 2020-06-01 MED ORDER — ROCURONIUM BROMIDE 10 MG/ML (PF) SYRINGE
PREFILLED_SYRINGE | INTRAVENOUS | Status: DC | PRN
  Administered 2020-06-01: 60 mg via INTRAVENOUS
  Administered 2020-06-01: 10 mg via INTRAVENOUS

## 2020-06-01 MED ORDER — OXYCODONE HCL 5 MG PO TABS
5.0000 mg | ORAL_TABLET | ORAL | Status: DC | PRN
  Administered 2020-06-01: 5 mg via ORAL
  Administered 2020-06-02 (×5): 10 mg via ORAL
  Administered 2020-06-02: 5 mg via ORAL
  Administered 2020-06-03 – 2020-06-04 (×6): 10 mg via ORAL
  Administered 2020-06-04: 5 mg via ORAL
  Administered 2020-06-04: 10 mg via ORAL
  Filled 2020-06-01: qty 2
  Filled 2020-06-01: qty 1
  Filled 2020-06-01: qty 2
  Filled 2020-06-01: qty 1
  Filled 2020-06-01: qty 2
  Filled 2020-06-01: qty 1
  Filled 2020-06-01 (×3): qty 2
  Filled 2020-06-01: qty 1
  Filled 2020-06-01: qty 2
  Filled 2020-06-01: qty 1
  Filled 2020-06-01 (×3): qty 2

## 2020-06-01 MED ORDER — MUPIROCIN 2 % EX OINT: TOPICAL_OINTMENT | Freq: Two times a day (BID) | CUTANEOUS | Status: DC

## 2020-06-01 MED ORDER — FUROSEMIDE 20 MG PO TABS
20.0000 mg | ORAL_TABLET | Freq: Every day | ORAL | Status: DC | PRN
  Administered 2020-06-03: 20 mg via ORAL
  Filled 2020-06-01: qty 1

## 2020-06-01 MED ORDER — PANTOPRAZOLE SODIUM 40 MG PO TBEC
40.0000 mg | DELAYED_RELEASE_TABLET | Freq: Every day | ORAL | Status: DC
  Administered 2020-06-01 – 2020-06-03 (×3): 40 mg via ORAL
  Filled 2020-06-01 (×3): qty 1

## 2020-06-01 MED ORDER — LIDOCAINE-EPINEPHRINE 1 %-1:100000 IJ SOLN
INTRAMUSCULAR | Status: DC | PRN
  Administered 2020-06-01: 10 mL

## 2020-06-01 MED ORDER — CYCLOBENZAPRINE HCL 10 MG PO TABS
10.0000 mg | ORAL_TABLET | Freq: Three times a day (TID) | ORAL | Status: DC | PRN
  Administered 2020-06-01 – 2020-06-04 (×6): 10 mg via ORAL
  Filled 2020-06-01 (×6): qty 1

## 2020-06-01 MED ORDER — CHLORHEXIDINE GLUCONATE CLOTH 2 % EX PADS: 6.0000 | MEDICATED_PAD | Freq: Once | CUTANEOUS | Status: DC

## 2020-06-01 MED ORDER — PHENOL 1.4 % MT LIQD: 1.0000 | OROMUCOSAL | Status: DC | PRN

## 2020-06-01 MED ORDER — VITAMIN D (ERGOCALCIFEROL) 1.25 MG (50000 UNIT) PO CAPS
50000.0000 [IU] | ORAL_CAPSULE | ORAL | Status: DC
  Administered 2020-06-02: 50000 [IU] via ORAL
  Filled 2020-06-01: qty 1

## 2020-06-01 MED ORDER — SUGAMMADEX SODIUM 200 MG/2ML IV SOLN
INTRAVENOUS | Status: DC | PRN
  Administered 2020-06-01: 200 mg via INTRAVENOUS

## 2020-06-01 MED ORDER — EPHEDRINE SULFATE-NACL 50-0.9 MG/10ML-% IV SOSY
PREFILLED_SYRINGE | INTRAVENOUS | Status: DC | PRN
  Administered 2020-06-01: 10 mg via INTRAVENOUS

## 2020-06-01 MED ORDER — ALUM & MAG HYDROXIDE-SIMETH 200-200-20 MG/5ML PO SUSP: 30.0000 mL | Freq: Four times a day (QID) | ORAL | Status: DC | PRN

## 2020-06-01 MED ORDER — FENTANYL CITRATE (PF) 250 MCG/5ML IJ SOLN
INTRAMUSCULAR | Status: DC | PRN
  Administered 2020-06-01 (×3): 50 ug via INTRAVENOUS
  Administered 2020-06-01: 100 ug via INTRAVENOUS

## 2020-06-01 MED ORDER — HYDROMORPHONE HCL 1 MG/ML IJ SOLN
0.5000 mg | INTRAMUSCULAR | Status: DC | PRN
  Administered 2020-06-01: 0.5 mg via INTRAVENOUS
  Filled 2020-06-01: qty 0.5

## 2020-06-01 MED ORDER — FENTANYL CITRATE (PF) 100 MCG/2ML IJ SOLN
25.0000 ug | INTRAMUSCULAR | Status: DC | PRN
  Administered 2020-06-01 (×2): 50 ug via INTRAVENOUS

## 2020-06-01 MED ORDER — ONDANSETRON HCL 4 MG/2ML IJ SOLN: 4.0000 mg | Freq: Once | INTRAMUSCULAR | Status: DC | PRN

## 2020-06-01 MED ORDER — MUPIROCIN 2 % EX OINT
TOPICAL_OINTMENT | CUTANEOUS | Status: AC
  Administered 2020-06-01: 1 via NASAL
  Filled 2020-06-01: qty 22

## 2020-06-01 MED ORDER — PANTOPRAZOLE SODIUM 20 MG PO TBEC: 20.0000 mg | DELAYED_RELEASE_TABLET | Freq: Every day | ORAL | Status: DC

## 2020-06-01 MED ORDER — FENTANYL CITRATE (PF) 250 MCG/5ML IJ SOLN
INTRAMUSCULAR | Status: AC
  Filled 2020-06-01: qty 5

## 2020-06-01 MED ORDER — THROMBIN 5000 UNITS EX SOLR
CUTANEOUS | Status: AC
  Filled 2020-06-01: qty 10000

## 2020-06-01 MED ORDER — CHLORHEXIDINE GLUCONATE 0.12 % MT SOLN
15.0000 mL | Freq: Once | OROMUCOSAL | Status: AC
  Administered 2020-06-01: 15 mL via OROMUCOSAL
  Filled 2020-06-01: qty 15

## 2020-06-01 MED ORDER — HEMOSTATIC AGENTS (NO CHARGE) OPTIME
TOPICAL | Status: DC | PRN
  Administered 2020-06-01: 1 via TOPICAL

## 2020-06-01 MED ORDER — LORATADINE 10 MG PO TABS
10.0000 mg | ORAL_TABLET | Freq: Every day | ORAL | Status: DC
  Administered 2020-06-01 – 2020-06-04 (×4): 10 mg via ORAL
  Filled 2020-06-01 (×4): qty 1

## 2020-06-01 MED ORDER — LACTATED RINGERS IV SOLN: INTRAVENOUS | Status: DC

## 2020-06-01 MED ORDER — BUPIVACAINE HCL (PF) 0.25 % IJ SOLN
INTRAMUSCULAR | Status: DC | PRN
  Administered 2020-06-01: 10 mL

## 2020-06-01 MED ORDER — GABAPENTIN 300 MG PO CAPS
300.0000 mg | ORAL_CAPSULE | Freq: Three times a day (TID) | ORAL | Status: DC
  Administered 2020-06-01 – 2020-06-04 (×9): 300 mg via ORAL
  Filled 2020-06-01 (×9): qty 1

## 2020-06-01 MED ORDER — OXYCODONE HCL 5 MG PO TABS
5.0000 mg | ORAL_TABLET | ORAL | Status: DC | PRN
  Administered 2020-06-01: 5 mg via ORAL
  Filled 2020-06-01: qty 1

## 2020-06-01 MED ORDER — FENTANYL CITRATE (PF) 100 MCG/2ML IJ SOLN
INTRAMUSCULAR | Status: AC
  Filled 2020-06-01: qty 2

## 2020-06-01 MED ORDER — TRAMADOL HCL 50 MG PO TABS: 100.0000 mg | ORAL_TABLET | Freq: Two times a day (BID) | ORAL | Status: DC | PRN

## 2020-06-01 MED ORDER — ACETAMINOPHEN 650 MG RE SUPP: 650.0000 mg | RECTAL | Status: DC | PRN

## 2020-06-01 MED ORDER — LIDOCAINE 2% (20 MG/ML) 5 ML SYRINGE
INTRAMUSCULAR | Status: DC | PRN
  Administered 2020-06-01: 40 mg via INTRAVENOUS

## 2020-06-01 MED ORDER — PROPOFOL 10 MG/ML IV BOLUS
INTRAVENOUS | Status: AC
  Filled 2020-06-01: qty 20

## 2020-06-01 MED ORDER — 0.9 % SODIUM CHLORIDE (POUR BTL) OPTIME
TOPICAL | Status: DC | PRN
  Administered 2020-06-01: 1000 mL

## 2020-06-01 MED ORDER — RAMIPRIL 2.5 MG PO CAPS
2.5000 mg | ORAL_CAPSULE | Freq: Every day | ORAL | Status: DC
  Administered 2020-06-02 – 2020-06-04 (×3): 2.5 mg via ORAL
  Filled 2020-06-01 (×4): qty 1

## 2020-06-01 MED ORDER — ALPRAZOLAM 0.25 MG PO TABS
0.2500 mg | ORAL_TABLET | Freq: Every day | ORAL | Status: DC | PRN
  Administered 2020-06-02 – 2020-06-03 (×2): 0.25 mg via ORAL
  Filled 2020-06-01 (×2): qty 1

## 2020-06-01 MED ORDER — ORAL CARE MOUTH RINSE: 15.0000 mL | Freq: Once | OROMUCOSAL | Status: AC

## 2020-06-01 MED ORDER — ACETAMINOPHEN 325 MG PO TABS
650.0000 mg | ORAL_TABLET | ORAL | Status: DC | PRN
  Administered 2020-06-02 – 2020-06-03 (×3): 650 mg via ORAL
  Filled 2020-06-01 (×3): qty 2

## 2020-06-01 MED ORDER — OXYCODONE HCL 5 MG/5ML PO SOLN: 5.0000 mg | Freq: Once | ORAL | Status: DC | PRN

## 2020-06-01 MED ORDER — ONDANSETRON HCL 4 MG PO TABS: 4.0000 mg | ORAL_TABLET | Freq: Four times a day (QID) | ORAL | Status: DC | PRN

## 2020-06-01 MED ORDER — ONDANSETRON HCL 4 MG/2ML IJ SOLN
INTRAMUSCULAR | Status: DC | PRN
  Administered 2020-06-01: 4 mg via INTRAVENOUS

## 2020-06-01 MED ORDER — SODIUM CHLORIDE 0.9% FLUSH
3.0000 mL | Freq: Two times a day (BID) | INTRAVENOUS | Status: DC
  Administered 2020-06-01 – 2020-06-04 (×6): 3 mL via INTRAVENOUS

## 2020-06-01 MED ORDER — VANCOMYCIN HCL IN DEXTROSE 1-5 GM/200ML-% IV SOLN
1000.0000 mg | Freq: Two times a day (BID) | INTRAVENOUS | Status: DC
  Administered 2020-06-02 (×3): 1000 mg via INTRAVENOUS
  Filled 2020-06-01 (×3): qty 200

## 2020-06-01 MED ORDER — TRAMADOL HCL 50 MG PO TABS: 50.0000 mg | ORAL_TABLET | Freq: Four times a day (QID) | ORAL | Status: DC | PRN

## 2020-06-01 MED ORDER — MECLIZINE HCL 12.5 MG PO TABS
25.0000 mg | ORAL_TABLET | Freq: Three times a day (TID) | ORAL | Status: DC | PRN
  Filled 2020-06-01: qty 1

## 2020-06-01 MED ORDER — CHLORHEXIDINE GLUCONATE 0.12 % MT SOLN: 15.0000 mL | Freq: Once | OROMUCOSAL | Status: DC

## 2020-06-01 MED ORDER — LIDOCAINE-EPINEPHRINE 1 %-1:100000 IJ SOLN
INTRAMUSCULAR | Status: AC
  Filled 2020-06-01: qty 1

## 2020-06-01 MED ORDER — MENTHOL 3 MG MT LOZG: 1.0000 | LOZENGE | OROMUCOSAL | Status: DC | PRN

## 2020-06-01 MED ORDER — OXYCODONE HCL 5 MG PO TABS: 5.0000 mg | ORAL_TABLET | Freq: Once | ORAL | Status: DC | PRN

## 2020-06-01 MED ORDER — DEXAMETHASONE SODIUM PHOSPHATE 10 MG/ML IJ SOLN: 10.0000 mg | Freq: Once | INTRAMUSCULAR | Status: DC

## 2020-06-01 MED ORDER — CELECOXIB 200 MG PO CAPS
200.0000 mg | ORAL_CAPSULE | Freq: Every day | ORAL | Status: DC | PRN
  Administered 2020-06-01 – 2020-06-03 (×3): 200 mg via ORAL
  Filled 2020-06-01 (×3): qty 1

## 2020-06-01 MED ORDER — BUPIVACAINE HCL (PF) 0.25 % IJ SOLN
INTRAMUSCULAR | Status: AC
  Filled 2020-06-01: qty 30

## 2020-06-01 MED ORDER — SODIUM CHLORIDE 0.9% FLUSH: 3.0000 mL | INTRAVENOUS | Status: DC | PRN

## 2020-06-01 MED ORDER — PANTOPRAZOLE SODIUM 40 MG IV SOLR: 40.0000 mg | Freq: Every day | INTRAVENOUS | Status: DC

## 2020-06-01 MED ORDER — ORAL CARE MOUTH RINSE: 15.0000 mL | Freq: Once | OROMUCOSAL | Status: DC

## 2020-06-01 MED ORDER — SODIUM CHLORIDE 0.9 % IV SOLN
250.0000 mL | INTRAVENOUS | Status: DC
  Administered 2020-06-01: 250 mL via INTRAVENOUS

## 2020-06-01 SURGICAL SUPPLY — 54 items
BAG DECANTER FOR FLEXI CONT (MISCELLANEOUS) ×3 IMPLANT
BAND RUBBER #18 3X1/16 STRL (MISCELLANEOUS) ×6 IMPLANT
BENZOIN TINCTURE PRP APPL 2/3 (GAUZE/BANDAGES/DRESSINGS) ×3 IMPLANT
BLADE CLIPPER SURG (BLADE) IMPLANT
BLADE SURG 11 STRL SS (BLADE) ×3 IMPLANT
BUR CUTTER 7.0 ROUND (BURR) ×3 IMPLANT
BUR MATCHSTICK NEURO 3.0 LAGG (BURR) ×3 IMPLANT
CANISTER SUCT 3000ML PPV (MISCELLANEOUS) ×3 IMPLANT
CARTRIDGE OIL MAESTRO DRILL (MISCELLANEOUS) ×1 IMPLANT
CLOSURE WOUND 1/2 X4 (GAUZE/BANDAGES/DRESSINGS) ×1
COVER WAND RF STERILE (DRAPES) IMPLANT
DECANTER SPIKE VIAL GLASS SM (MISCELLANEOUS) ×3 IMPLANT
DERMABOND ADVANCED (GAUZE/BANDAGES/DRESSINGS) ×2
DERMABOND ADVANCED .7 DNX12 (GAUZE/BANDAGES/DRESSINGS) ×1 IMPLANT
DIFFUSER DRILL AIR PNEUMATIC (MISCELLANEOUS) ×3 IMPLANT
DRAPE HALF SHEET 40X57 (DRAPES) IMPLANT
DRAPE LAPAROTOMY 100X72X124 (DRAPES) ×3 IMPLANT
DRAPE MICROSCOPE LEICA (MISCELLANEOUS) ×3 IMPLANT
DRAPE SURG 17X23 STRL (DRAPES) ×3 IMPLANT
DRSG OPSITE POSTOP 4X8 (GAUZE/BANDAGES/DRESSINGS) ×3 IMPLANT
DURAPREP 26ML APPLICATOR (WOUND CARE) ×3 IMPLANT
ELECT REM PT RETURN 9FT ADLT (ELECTROSURGICAL) ×3
ELECTRODE REM PT RTRN 9FT ADLT (ELECTROSURGICAL) ×1 IMPLANT
EVACUATOR 1/8 PVC DRAIN (DRAIN) ×3 IMPLANT
GAUZE 4X4 16PLY RFD (DISPOSABLE) ×3 IMPLANT
GAUZE SPONGE 4X4 12PLY STRL (GAUZE/BANDAGES/DRESSINGS) ×3 IMPLANT
GLOVE BIO SURGEON STRL SZ7 (GLOVE) IMPLANT
GLOVE BIO SURGEON STRL SZ8 (GLOVE) ×3 IMPLANT
GLOVE BIOGEL PI IND STRL 7.0 (GLOVE) IMPLANT
GLOVE BIOGEL PI INDICATOR 7.0 (GLOVE)
GLOVE EXAM NITRILE XL STR (GLOVE) IMPLANT
GLOVE INDICATOR 8.5 STRL (GLOVE) ×3 IMPLANT
GOWN STRL REUS W/ TWL LRG LVL3 (GOWN DISPOSABLE) ×1 IMPLANT
GOWN STRL REUS W/ TWL XL LVL3 (GOWN DISPOSABLE) ×2 IMPLANT
GOWN STRL REUS W/TWL 2XL LVL3 (GOWN DISPOSABLE) IMPLANT
GOWN STRL REUS W/TWL LRG LVL3 (GOWN DISPOSABLE) ×3
GOWN STRL REUS W/TWL XL LVL3 (GOWN DISPOSABLE) ×6
KIT BASIN OR (CUSTOM PROCEDURE TRAY) ×3 IMPLANT
KIT TURNOVER KIT B (KITS) ×3 IMPLANT
NEEDLE HYPO 22GX1.5 SAFETY (NEEDLE) ×3 IMPLANT
NEEDLE SPNL 22GX3.5 QUINCKE BK (NEEDLE) ×3 IMPLANT
NS IRRIG 1000ML POUR BTL (IV SOLUTION) ×3 IMPLANT
OIL CARTRIDGE MAESTRO DRILL (MISCELLANEOUS) ×3
PACK LAMINECTOMY NEURO (CUSTOM PROCEDURE TRAY) ×3 IMPLANT
SPONGE SURGIFOAM ABS GEL SZ50 (HEMOSTASIS) ×3 IMPLANT
STRIP CLOSURE SKIN 1/2X4 (GAUZE/BANDAGES/DRESSINGS) ×2 IMPLANT
SUT VIC AB 0 CT1 18XCR BRD8 (SUTURE) ×2 IMPLANT
SUT VIC AB 0 CT1 8-18 (SUTURE) ×6
SUT VIC AB 2-0 CT1 18 (SUTURE) ×6 IMPLANT
SUT VIC AB 4-0 PS2 27 (SUTURE) ×3 IMPLANT
SUT VICRYL 4-0 PS2 18IN ABS (SUTURE) ×3 IMPLANT
TOWEL GREEN STERILE (TOWEL DISPOSABLE) ×3 IMPLANT
TOWEL GREEN STERILE FF (TOWEL DISPOSABLE) ×3 IMPLANT
WATER STERILE IRR 1000ML POUR (IV SOLUTION) ×3 IMPLANT

## 2020-06-01 NOTE — Anesthesia Postprocedure Evaluation (Signed)
Anesthesia Post Note  Patient: Wendy Carrillo  Procedure(s) Performed: Laminectomy and Foraminotomy - left - Lumbar Two-Lumbar Three -  - bilateral - Lumbar Four-Lumbar Five (Bilateral Back)     Patient location during evaluation: PACU Anesthesia Type: General Level of consciousness: awake and alert Pain management: pain level controlled Vital Signs Assessment: post-procedure vital signs reviewed and stable Respiratory status: spontaneous breathing, nonlabored ventilation, respiratory function stable and patient connected to nasal cannula oxygen Cardiovascular status: blood pressure returned to baseline and stable Postop Assessment: no apparent nausea or vomiting Anesthetic complications: no   No complications documented.  Last Vitals:  Vitals:   06/01/20 1651 06/01/20 1957  BP: 115/77 (!) 112/56  Pulse: 82 88  Resp: 18 18  Temp: 36.6 C 36.7 C  SpO2:  100%    Last Pain:  Vitals:   06/01/20 1957  TempSrc: Oral  PainSc:                  Jacie Tristan COKER

## 2020-06-01 NOTE — Anesthesia Procedure Notes (Signed)
Procedure Name: Intubation Date/Time: 06/01/2020 12:36 PM Performed by: Janace Litten, CRNA Pre-anesthesia Checklist: Patient identified, Emergency Drugs available, Suction available and Patient being monitored Patient Re-evaluated:Patient Re-evaluated prior to induction Oxygen Delivery Method: Circle System Utilized and Simple face mask Preoxygenation: Pre-oxygenation with 100% oxygen Induction Type: IV induction Ventilation: Mask ventilation without difficulty Laryngoscope Size: Mac and 3 Grade View: Grade II Tube type: Oral Tube size: 7.0 mm Number of attempts: 1 Airway Equipment and Method: Stylet Placement Confirmation: ETT inserted through vocal cords under direct vision,  positive ETCO2 and breath sounds checked- equal and bilateral Secured at: 23 cm Tube secured with: Tape Dental Injury: Teeth and Oropharynx as per pre-operative assessment  Difficulty Due To: Difficulty was unanticipated Comments: Intubation by Pierce Crane., SRNA.

## 2020-06-01 NOTE — Progress Notes (Addendum)
Pharmacy Antibiotic Note  Wendy Carrillo is a 79 y.o. female admitted on 06/01/2020 with lumbar spinal stenosis with radiculopathy and radicular symptoms; pt is S/P neurosurgery this afternoon.  Pharmacy has been consulted for vancomycin dosing for post-operative surgical prophylaxis. Per surgeon note, pt has drain in place post op.  Pt rec'd vancomycin 1500 mg IV X 1 pre op at 1151 AM today.  WBC 5.5, afebrile, Scr 0.81, CrCl 64.6 ml/min  Plan: Vancomycin 1 gram IV Q 12 hrs while drain in place, first dose ~12 hrs after pre op dose, per [facility] vancomycin nomogram (goal vancomycin trough for surgical prophylaxis: 10-15 mg/L) Monitor renal function, duration of therapy  Height: 5' 4.5" (163.8 cm) Weight: (!) 98 kg (216 lb) IBW/kg (Calculated) : 55.85  Temp (24hrs), Avg:98.1 F (36.7 C), Min:97.5 F (36.4 C), Max:98.7 F (37.1 C)  Recent Labs  Lab 05/30/20 1426  WBC 5.5  CREATININE 0.81    Estimated Creatinine Clearance: 64.6 mL/min (by C-G formula based on SCr of 0.81 mg/dL).    Allergies  Allergen Reactions  . Contrast Media [Iodinated Diagnostic Agents] Swelling, Rash and Other (See Comments)    Face, tongue swelling Needs 13-hour prep  . Penicillins Swelling and Rash    PATIENT HAS HAD A PCN REACTION WITH IMMEDIATE RASH, FACIAL/TONGUE/THROAT SWELLING, SOB, OR LIGHTHEADEDNESS WITH HYPOTENSION:  #  #  YES  #  #  Has patient had a PCN reaction causing severe rash involving mucus membranes or skin necrosis: NO Has patient had a PCN reaction that required hospitalization NO Has patient had a PCN reaction occurring within the last 10 years: NO If all of the above answers are "NO", then may proceed with Cephalosporin use.  . Baclofen Itching  . Latex Rash  . Other Dermatitis and Other (See Comments)    Bandages caused redness     Microbiology results: 7/26 MRSA PCR: negative 7/26 Nasal swab: positive for Staph aureus 7/26 COVID: negative  Thank you for allowing  pharmacy to be a part of this patient's care.  Gillermina Hu, PharmD, BCPS, [facility] Clinical  Pharmacist 06/01/2020 4:40 PM

## 2020-06-01 NOTE — Transfer of Care (Signed)
Immediate Anesthesia Transfer of Care Note  Patient: Wendy Carrillo  Procedure(s) Performed: Laminectomy and Foraminotomy - left - Lumbar Two-Lumbar Three -  - bilateral - Lumbar Four-Lumbar Five (Bilateral Back)  Patient Location: PACU  Anesthesia Type:General  Level of Consciousness: drowsy, patient cooperative and responds to stimulation  Airway & Oxygen Therapy: Patient Spontanous Breathing  Post-op Assessment: Report given to RN and Post -op Vital signs reviewed and stable  Post vital signs: Reviewed and stable  Last Vitals:  Vitals Value Taken Time  BP 129/81 06/01/20 1528  Temp    Pulse 89 06/01/20 1529  Resp 17 06/01/20 1529  SpO2 90 % 06/01/20 1529  Vitals shown include unvalidated device data.  Last Pain:  Vitals:   06/01/20 1125  TempSrc:   PainSc: 0-No pain         Complications: No complications documented.

## 2020-06-01 NOTE — H&P (Signed)
Wendy Carrillo is an 79 y.o. female.   Chief Complaint: Back bilateral leg pain HPI: 68-year female with progressive worsening back bilateral leg pain on the left side it radiates down L3 nerve root pattern on the right he is down around L5 work-up revealed severe foraminal stenosis and lumbar spinal stenosis L2-3 on the left and bilateral lateral recess stenosis L4-5 due to patient progression of clinical syndrome imaging findings and failed conservative treatment I recommended laminotomies foraminotomies at L2-3 on the left and bilaterally at L4-5 I extensively gone over the risks and benefits of that procedure with her as well as perioperative course expectations of outcome and alternatives of surgery and she understands and agrees to proceed forward.  Past Medical History:  Diagnosis Date  . Arthritis    "all over"  . Chronic lower back pain   . Coronary artery calcification 10/21/2017  . Family history of adverse reaction to anesthesia    "one of my son's passed out"  . Gastritis   . GERD (gastroesophageal reflux disease)   . 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 History of blood transfusion 1992   "related to knee replacement"  . History of duodenal ulcer   . Hypertension   . PONV (postoperative nausea and vomiting)   . Spinal headache   . Spinal stenosis, lumbar   . Wears glasses   . Wears partial dentures     Past Surgical History:  Procedure Laterality Date  . ABDOMINAL HERNIA REPAIR    . ANTERIOR AND POSTERIOR VAGINAL REPAIR  2015  . APPENDECTOMY  ?1980's  . BLADDER SUSPENSION    . BREAST CYST EXCISION Bilateral   . BREAST SURGERY Left    "nipple taken off"  . CARDIAC CATHETERIZATION    . CARPAL TUNNEL RELEASE Bilateral 2010's  . CATARACT EXTRACTION W/ INTRAOCULAR LENS IMPLANT Left 2014  . CHOLECYSTECTOMY OPEN  1980's  . COLONOSCOPY W/ BIOPSIES AND POLYPECTOMY    . DILATION AND CURETTAGE OF UTERUS   "several"  . HERNIA REPAIR    . JOINT REPLACEMENT    . LEFT HEART CATH AND CORONARY ANGIOGRAPHY N/A 10/23/2017   Procedure: LEFT HEART CATH AND CORONARY ANGIOGRAPHY;  Surgeon: Troy Sine, MD;  Location: [facility];  Service: Cardiovascular;  Laterality: N/A;  . LUMBAR LAMINECTOMY/DECOMPRESSION MICRODISCECTOMY Bilateral 06/09/2018   Procedure: Laminectomy and Foraminotomy - Lumbar Two,Lumbar Three - Lumbar One- Two - Lumbar Three-Four- bilateral;  Surgeon: Kary Kos, MD;  Location: [facility];  Service: Neurosurgery;  Laterality: Bilateral;  . MULTIPLE TOOTH EXTRACTIONS    . PATELLA FRACTURE SURGERY Left "after 1992"   "after the replacement"  . REDUCTION MAMMAPLASTY Bilateral   . REPLACEMENT TOTAL KNEE Bilateral    "left in 1992; right in 2001"  . VAGINAL HYSTERECTOMY  1972    Family History  Problem Relation Age of Onset  . Hypertension Mother   . Stroke Mother   . Diabetes Mother   . Heart attack Father   . Breast cancer Sister   . Leukemia Brother   . Breast cancer Sister   . Breast cancer Sister   . Heart disease Brother    Social History:  reports that she has never smoked. She has never used smokeless tobacco. She reports that she does not drink alcohol and does not use drugs.  Allergies:  Allergies  Allergen Reactions  . Contrast Media [Iodinated Diagnostic Agents] Swelling,  Rash and Other (See Comments)    Face, tongue swelling Needs 13-hour prep  . Penicillins Swelling and Rash    PATIENT HAS HAD A PCN REACTION WITH IMMEDIATE RASH, FACIAL/TONGUE/THROAT SWELLING, SOB, OR LIGHTHEADEDNESS WITH HYPOTENSION:  #  #  YES  #  #  Has patient had a PCN reaction causing severe rash involving mucus membranes or skin necrosis: NO Has patient had a PCN reaction that required hospitalization NO Has patient had a PCN reaction occurring within the last 10 years: NO If all of the above answers are "NO", then may proceed with Cephalosporin use.  . Baclofen Itching  . Latex  Rash  . Other Dermatitis and Other (See Comments)    Bandages caused redness     Medications Prior to Admission  Medication Sig Dispense Refill  . ALPRAZolam (XANAX) 0.25 MG tablet Take 1 tablet (0.25 mg total) by mouth daily as needed. (Patient taking differently: Take 0.25 mg by mouth daily as needed for anxiety. ) 30 tablet 0  . celecoxib (CELEBREX) 200 MG capsule Take 200 mg by mouth daily as needed for mild pain.     . furosemide (LASIX) 20 MG tablet Take 20 mg by mouth daily as needed for fluid.     Marland Kitchen gabapentin (NEURONTIN) 300 MG capsule Take 300 mg by mouth 3 (three) times daily.     Marland Kitchen loratadine (CLARITIN) 10 MG tablet Take 10 mg by mouth daily. In the morning    . meclizine (ANTIVERT) 25 MG tablet Take 25 mg by mouth 3 (three) times daily as needed for dizziness.     . pantoprazole (PROTONIX) 20 MG tablet Take 1 tablet (20 mg total) by mouth daily. (Patient taking differently: Take 20 mg by mouth at bedtime. ) 30 tablet 0  . ramipril (ALTACE) 2.5 MG capsule Take 2.5 mg by mouth daily.  5  . traMADol HCl 100 MG TABS Take 100 mg by mouth 2 (two) times daily as needed (pain).    . Vitamin D, Ergocalciferol, (DRISDOL) 50000 UNITS CAPS capsule Take 50,000 Units by mouth every Thursday.   5  . docusate sodium (COLACE) 100 MG capsule Take 1 capsule (100 mg total) by mouth every 12 (twelve) hours. (Patient not taking: Reported on 05/20/2020) 30 capsule 0  . oxyCODONE-acetaminophen (PERCOCET) 5-325 MG tablet Take 1-2 tablets by mouth every 6 (six) hours as needed for severe pain. (Patient not taking: Reported on 05/20/2020) 12 tablet 0    Results for orders placed or performed during the hospital encounter of 05/30/20 (from the past 48 hour(s))  SARS CORONAVIRUS 2 (TAT 6-24 HRS) Nasopharyngeal Nasopharyngeal Swab     Status: None   Collection Time: 05/30/20  2:50 PM   Specimen: Nasopharyngeal Swab  Result Value Ref Range   SARS Coronavirus 2 NEGATIVE NEGATIVE    Comment: (NOTE) SARS-CoV-2  target nucleic acids are NOT DETECTED.  The SARS-CoV-2 RNA is generally detectable in upper and lower respiratory specimens during the acute phase of infection. Negative results do not preclude SARS-CoV-2 infection, do not rule out co-infections with other pathogens, and should not be used as the sole basis for treatment or other patient management decisions. Negative results must be combined with clinical observations, patient history, and epidemiological information. The expected result is Negative.  Fact Sheet for Patients: SugarRoll.be  Fact Sheet for Healthcare Providers: https://www.woods-mathews.com/  This test is not yet approved or cleared by the [place] FDA and  has been authorized for detection and/or diagnosis of SARS-CoV-2  by FDA under an Emergency Use Authorization (EUA). This EUA will remain  in effect (meaning this test can be used) for the duration of the COVID-19 declaration under Se ction 564(b)(1) of the Act, 21 U.S.C. section 360bbb-3(b)(1), unless the authorization is terminated or revoked sooner.  Performed at [facility], [place] [address] [place], [place] [address]    No results found.  Review of Systems  Musculoskeletal: Positive for back pain.  Neurological: Positive for numbness.    Blood pressure (!) 150/70, pulse 74, temperature 98.7 F (37.1 C), temperature source Oral, resp. rate 17, height 5' 4.5" (1.638 m), weight (!) 98 kg, SpO2 100 %. Physical Exam HENT:     Head: Normocephalic.     Nose: Nose normal.     Mouth/Throat:     Mouth: Mucous membranes are moist.  Eyes:     Pupils: Pupils are equal, round, and reactive to light.  Cardiovascular:     Rate and Rhythm: Normal rate.  Pulmonary:     Effort: Pulmonary effort is normal.  Abdominal:     General: Abdomen is flat.  Skin:    General: Skin is warm.  Neurological:     General: No focal deficit present.     Mental Status: She is  alert.     Comments: Strength 5-5 iliopsoas, quads, hamstrings, gastroc, and tibialis, and EHL.      Assessment/Plan 41-year female presents for left-sided to 3 decompressive laminectomy and bilateral decompressive laminotomies L4-5  Elaina Hoops, MD 06/01/2020, 12:11 PM

## 2020-06-01 NOTE — Op Note (Signed)
Preoperative diagnosis: Lumbar spinal stenosis L2-3 with left L3 radiculopathy  2.  Lumbar spinal stenosis L4-5 bilaterally with bilateral L5 radicular symptoms  Postoperative diagnosis: Same  Procedure: #1 redo decompressive laminectomy L2-3 on the left with microdissection of the left L3 nerve root with partial medial facetectomy and foraminotomy  2.  Bilateral decompressive laminotomies L4-5 with foraminotomies of the L5 nerve roots bilaterally and partial medial facetectomies  Surgeon: Dominica Severin Trinidad Petron  Assistant: Nash Shearer  Anesthesia: General  EBL L: Minimal  HPI: Patient 79 year old female progressive worsening back and bilateral leg pain L3 on the left bilateral L5 symptoms work-up revealed severe spinal stenosis with large spur on the left at L2-3 as well as bilateral foraminal stenosis at L4-5.  Due to patient's progressive clinical syndrome imaging findings and failed conservative treatment I recommended decompressive laminotomies at those levels.  I extensively went over the risks and benefits of that operation with her as well as perioperative course expectations of outcome and alternatives of surgery and she understood and agreed to proceed forward.  Operative procedure: Patient was brought into the OR was due to general anesthesia positioned prone the Wilson frame her back was prepped and draped in routine sterile fashion she had an old incision from previous laminectomy at L2-L4 so utilizing superior aspect of this incision and then a separate incision on the inferior aspect extending inferiorly so through 2 separate incisions I exposed L2-3 and L4-5 bilaterally.  Intraoperative x-ray initially identified the L1-2 disc base so I took my attention 1 disc interspace below this on the left and exposed L2-3 the 4 5 was correctly localized.  First working at 4 5 bilateral laminotomies were performed with a high-speed drill and a 3 Miller Kerrison punch.  Ligament flow was noted markedly  hypertrophied this was removed in piecemeal fashion identified the disc base and and the L5 pedicle and performed foraminotomies of the L5 nerve roots bilaterally.  Marked stenosis and hourglass compression of thecal sac primarily from facet arthropathy was a pathology.  This was all freed up decompressing the L5 nerve root bilaterally this was then packed with Gelfoam tension taken at L2-3.  Extensive eschar tissue would overlie the residual laminotomy defect at L2-3 this was all developed scar tissue was dissected off laminotomy defect laminotomy defect was extended slightly superiorly and laterally and then inferiorly and then unremarkable condition I under bit the medial facet complex identified the very large spur confirmed with intraoperative x-ray that was again at the right location dissected the scar tissue in the dura off of the large spur remove the spur decompressing the L3 nerve root and unroofing the L3 foramen.  At the end of decompression was no further stenosis L3 nerve root or thecal sac the wound scopes irrigated meticulous hemostasis was maintained Gelfoam was ON top of the dura and both wounds were then subsequent closed I did place a medium Hemovac drain at the L2-3 incision and both incisions were closed with interrupted Vicryl in a running 4 subcuticular Dermabond benzoin Steri-Strips and a sterile dressing was applied patient recovery in stable condition.  At the end the case all needle count sponge counts were correct.

## 2020-06-02 ENCOUNTER — Encounter (HOSPITAL_COMMUNITY): Payer: Self-pay | Admitting: Neurosurgery

## 2020-06-02 DIAGNOSIS — M48061 Spinal stenosis, lumbar region without neurogenic claudication: Secondary | ICD-10-CM | POA: Diagnosis not present

## 2020-06-02 NOTE — NC FL2 (Signed)
[place] LEVEL OF CARE SCREENING TOOL     IDENTIFICATION  Patient Name: Wendy Carrillo Birthdate: 17-Dec-1940 Sex: female Admission Date (Current Location): 06/01/2020  [facility] Number:  Herbalist and Address:  [facility], [place] [address], [place], [place] [address]      Provider Number: [id number]  Attending Physician Name and Address:  Kary Kos, MD  Relative Name and Phone Number:  Tyrone Nine, spouse, [phone]    Current Level of Care: Hospital Recommended Level of Care: [place] Prior Approval Number:    Date Approved/Denied:   PASRR Number: [id number]  Discharge Plan: SNF    Current Diagnoses: Patient Active Problem List   Diagnosis Date Noted   Spinal stenosis at L4-L5 level 05/18/2019   Benign essential HTN    History of total bilateral knee replacement (TKR)    Acute blood loss anemia    Post-operative pain    Spinal stenosis of lumbar region 06/09/2018   Spinal stenosis, lumbar    Hypertension    History of duodenal ulcer    Hepatitis    Gastritis    Family history of adverse reaction to anesthesia    Chronic lower back pain    Arthritis    Coronary artery calcification 10/21/2017   Combined forms of age-related cataract of right eye 04/18/2017   Status post total bilateral knee replacement 06/08/2016   Left lumbar radiculopathy 05/09/2016   Bilateral shoulder pain 03/22/2016   DDD (degenerative disc disease), cervical 03/22/2016   Morbid obesity with BMI of 40.0-44.9, adult ([name]) 03/08/2016   Spondylolisthesis, 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/2016   Neck pain 03/08/2016   Lumbar stenosis 03/08/2016   Cervical spinal stenosis 03/08/2016   Back pain 11/16/2015   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/10/2015   Angina pectoris ([name]) 04/20/2015   Morbid obesity ([name]) 04/20/2015   Dyspnea 04/20/2015   Atypical chest pain 04/20/2015   Esophageal reflux  02/23/2015   Essential hypertension 02/23/2015   Chronic back pain 02/23/2015   Chest pain 02/22/2015   Abdominal tenderness, LLQ (left lower quadrant) 06/15/2014   Acute sinusitis 06/15/2014   Allergic rhinitis due to pollen 06/15/2014   Bladder disorder 06/15/2014   Epistaxis 06/15/2014   Female stress incontinence 06/15/2014   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   Insomnia 06/15/2014   Localized primary osteoarthritis of wrist 06/15/2014   Lumbosacral spondylosis 06/15/2014   Osteoarthrosis, localized, primary, hand 06/15/2014   Referred otalgia 06/15/2014   Tingling 06/15/2014   Tinnitus 06/15/2014   Urinary incontinence 06/15/2014   Cystocele 05/04/2014   Retention of urine 05/04/2014   Cystocele, lateral 04/12/2014   Rectocele 03/17/2014   History of blood transfusion 11/05/1990    Orientation RESPIRATION BLADDER Height & Weight     Self, Time, Situation, Place  Normal Continent Weight: (!) 216 lb (98 kg) Height:  5' 4.5" (163.8 cm)  BEHAVIORAL SYMPTOMS/MOOD NEUROLOGICAL BOWEL NUTRITION STATUS      Continent Diet (Please see DC Summary)  AMBULATORY STATUS COMMUNICATION OF NEEDS Skin   Limited Assist Verbally Surgical wounds (Closed incision on back;)                       Personal Care Assistance Level of Assistance  Bathing, Feeding, Dressing     Dressing Assistance: Limited assistance     Functional Limitations Info  Sight, Hearing, Speech Sight Info: Adequate Hearing Info: Adequate Speech Info: Adequate    SPECIAL CARE FACTORS FREQUENCY  PT (  By licensed PT), OT (By licensed OT)     PT Frequency: 5x/week OT Frequency: 4x/week            Contractures Contractures Info: Not present    Additional Factors Info  Code Status, Allergies Code Status Info: Full Allergies Info: Contrast Media (Iodinated Diagnostic Agents), Penicillins, Baclofen, Latex, Other           Current Medications (06/02/2020):  This is the current hospital  active medication list Current Facility-Administered Medications  Medication Dose Route Frequency Provider Last Rate Last Admin   0.9 %  sodium chloride infusion  250 mL Intravenous Continuous Kary Kos, MD 1 mL/hr at 06/01/20 1701 250 mL at 06/01/20 1701   acetaminophen (TYLENOL) tablet 650 mg  650 mg Oral Q4H PRN Kary Kos, MD       Or   acetaminophen (TYLENOL) suppository 650 mg  650 mg Rectal Q4H PRN Kary Kos, MD       ALPRAZolam Duanne Moron) tablet 0.25 mg  0.25 mg Oral Daily PRN Kary Kos, MD   0.25 mg at 06/02/20 1012   alum & mag hydroxide-simeth (MAALOX/MYLANTA) 200-200-20 MG/5ML suspension 30 mL  30 mL Oral Q6H PRN Kary Kos, MD       celecoxib (CELEBREX) capsule 200 mg  200 mg Oral Daily PRN Kary Kos, MD   200 mg at 06/01/20 1817   cyclobenzaprine (FLEXERIL) tablet 10 mg  10 mg Oral TID PRN Kary Kos, MD   10 mg at 06/02/20 0514   furosemide (LASIX) tablet 20 mg  20 mg Oral Daily PRN Kary Kos, MD       gabapentin (NEURONTIN) capsule 300 mg  300 mg Oral TID Kary Kos, MD   300 mg at 06/02/20 1012   HYDROmorphone (DILAUDID) injection 0.5 mg  0.5 mg Intravenous Q2H PRN Kary Kos, MD   0.5 mg at 06/01/20 1702   loratadine (CLARITIN) tablet 10 mg  10 mg Oral Daily Kary Kos, MD   10 mg at 06/02/20 1012   meclizine (ANTIVERT) tablet 25 mg  25 mg Oral TID PRN Kary Kos, MD       menthol-cetylpyridinium (CEPACOL) lozenge 3 mg  1 lozenge Oral PRN Kary Kos, MD       Or   phenol (CHLORASEPTIC) mouth spray 1 spray  1 spray Mouth/Throat PRN Kary Kos, MD       ondansetron [facility]) tablet 4 mg  4 mg Oral Q6H PRN Kary Kos, MD       Or   ondansetron [facility]) injection 4 mg  4 mg Intravenous Q6H PRN Kary Kos, MD       oxyCODONE (Oxy IR/ROXICODONE) immediate release tablet 5-10 mg  5-10 mg Oral Q3H PRN Kary Kos, MD   10 mg at 06/02/20 1012   pantoprazole (PROTONIX) EC tablet 40 mg  40 mg Oral QHS Kary Kos, MD   40 mg at 06/01/20 2053   ramipril (ALTACE)  capsule 2.5 mg  2.5 mg Oral Daily Kary Kos, MD   2.5 mg at 06/02/20 1012   sodium chloride flush (NS) 0.9 % injection 3 mL  3 mL Intravenous Q12H Kary Kos, MD   3 mL at 06/02/20 1013   sodium chloride flush (NS) 0.9 % injection 3 mL  3 mL Intravenous PRN Kary Kos, MD       traMADol Veatrice Bourbon) tablet 50 mg  50 mg Oral Q6H PRN Kary Kos, MD       vancomycin (VANCOCIN) IVPB 1000 mg/200 mL premix  1,000 mg Intravenous Q12H Kary Kos, MD   Stopping Infusion hung by another clincian at 06/02/20 1011   Vitamin D (Ergocalciferol) (DRISDOL) capsule 50,000 Units  50,000 Units Oral Q Era Skeen, MD   50,000 Units at 06/02/20 1012     Discharge Medications: Please see discharge summary for a list of discharge medications.  Relevant Imaging Results:  Relevant Lab Results:   Additional Information SSN: [place], [place]

## 2020-06-02 NOTE — Progress Notes (Signed)
Patient doing well awake and alert condition of back pain significant provement preoperative leg pain  Afebrile stable vital signs strength 5 out of 5 wound clean dry intact  Mobilize with physical Occupational Therapy will need to work on skilled nursing placement

## 2020-06-02 NOTE — Evaluation (Signed)
Occupational Therapy Evaluation Patient Details Name: Wendy Carrillo MRN: [id number] DOB: Aug 21, 1941 Today's Date: 06/02/2020    History of Present Illness Pt is a 79 y/o female s/p decompressive laminectomies at L2-3 and L4-5. PMH including but not limited to HTN and spinal stenosis.   Clinical Impression   PTA patient was living with her spouse and was independent prior to mechanical fall noting increased need for assistance from family with IADLs. Patient currently limited by pain, decreased static/dynamic standing balance, and increased need for assistance with LB BADLs with adherence to back precautions. Patient would benefit from continued acute OT services in prep for safe d/c to next level of care. Patient/family requesting d/c to SNF rehab prior to return home to maximize safety and independence with self-care tasks, functional mobility and transfers 2/2 limited family support at home.    Follow Up Recommendations  SNF    Equipment Recommendations  None recommended by OT (Defer to next level of care)    Recommendations for Other Services       Precautions / Restrictions Precautions Precautions: Back Precaution Comments: Patient able to recall 2/3 back precautions at start of session and 3/3 at conclusion of session.  Restrictions Weight Bearing Restrictions: No      Mobility Bed Mobility Overal bed mobility: Needs Assistance Bed Mobility: Sit to Supine Rolling: Supervision Sidelying to sit: Supervision   Sit to supine: Min assist   General bed mobility comments: Assist to bring BLE from EOB to bed level.   Transfers Overall transfer level: Needs assistance Equipment used: Rolling walker (2 wheeled) Transfers: Sit to/from Stand Sit to Stand: Min assist;Mod assist         General transfer comment: Mod A for STS from low recliner and Min A for STS from EOB to RW.     Balance Overall balance assessment: Needs assistance Sitting-balance support: Feet  supported Sitting balance-Leahy Scale: Good     Standing balance support: During functional activity;Bilateral upper extremity supported;Single extremity supported Standing balance-Leahy Scale: Poor                             ADL either performed or assessed with clinical judgement   ADL Overall ADL's : Needs assistance/impaired     Grooming: Min guard;Standing Grooming Details (indicate cue type and reason): Standing at sink level          Upper Body Dressing : Set up;Sitting   Lower Body Dressing: Sitting/lateral leans;Sit to/from stand;Moderate assistance Lower Body Dressing Details (indicate cue type and reason): Max A to don footwear seated in Interior and spatial designer: Minimal assistance Toilet Transfer Details (indicate cue type and reason): Commode in bathroom with staff                 Vision Baseline Vision/History: Wears glasses Wears Glasses: At all times Patient Visual Report: No change from baseline Vision Assessment?: No apparent visual deficits     Perception     Praxis      Pertinent Vitals/Pain Pain Assessment: Faces Faces Pain Scale: Hurts even more Pain Location: back Pain Descriptors / Indicators: Grimacing;Guarding Pain Intervention(s): Monitored during session;Repositioned     Hand Dominance     Extremity/Trunk Assessment Upper Extremity Assessment Upper Extremity Assessment: Generalized weakness   Lower Extremity Assessment Lower Extremity Assessment: Generalized weakness   Cervical / Trunk Assessment Cervical / Trunk Assessment: Other exceptions Cervical / Trunk Exceptions: s/p lumbar sx   Communication Communication Communication: No  difficulties   Cognition Arousal/Alertness: Awake/alert Behavior During Therapy: WFL for tasks assessed/performed Overall Cognitive Status: Impaired/Different from baseline Area of Impairment: Safety/judgement;Problem solving                         Safety/Judgement:  Decreased awareness of deficits;Decreased awareness of safety   Problem Solving: Slow processing;Difficulty sequencing;Requires verbal cues     General Comments       Exercises     Shoulder Instructions      Home Living Family/patient expects to be discharged to:: Private residence Living Arrangements: Spouse/significant other Available Help at Discharge: Family Type of Home: House Home Access: Stairs to enter Technical brewer of Steps: 1   Home Layout: One level     Bathroom Shower/Tub: Occupational psychologist: Standard     Home Equipment: Environmental consultant - 2 wheels          Prior Functioning/Environment Level of Independence: Independent                 OT Problem List: Decreased strength;Decreased activity tolerance;Impaired balance (sitting and/or standing);Decreased knowledge of use of DME or AE;Decreased knowledge of precautions;Pain      OT Treatment/Interventions: Self-care/ADL training;Therapeutic exercise;Energy conservation;DME and/or AE instruction;Therapeutic activities;Patient/family education;Balance training    OT Goals(Current goals can be found in the care plan section) Acute Rehab OT Goals Patient Stated Goal: Patient/family requesting SNF rehab prior to return home.  OT Goal Formulation: With patient Time For Goal Achievement: 06/16/20 Potential to Achieve Goals: Good  OT Frequency: Min 2X/week   Barriers to D/C: Decreased caregiver support          Co-evaluation              AM-PAC OT "6 Clicks" Daily Activity     Outcome Measure Help from another person eating meals?: None Help from another person taking care of personal grooming?: A Little Help from another person toileting, which includes using toliet, bedpan, or urinal?: A Little Help from another person bathing (including washing, rinsing, drying)?: A Lot Help from another person to put on and taking off regular upper body clothing?: A Little Help from another  person to put on and taking off regular lower body clothing?: A Lot 6 Click Score: 17   End of Session Equipment Utilized During Treatment: Gait belt;Rolling walker  Activity Tolerance: Patient limited by pain Patient left: in bed;with call bell/phone within reach;with bed alarm set  OT Visit Diagnosis: Unsteadiness on feet (R26.81);History of falling (Z91.81);Muscle weakness (generalized) (M62.81)                Time: 9675-9163 OT Time Calculation (min): 18 min Charges:  OT General Charges $OT Visit: 1 Visit OT Evaluation $OT Eval Moderate Complexity: 1 Mod  Woods Gangemi H. OTR/L Supplemental OT, Department of rehab services [phone]  Caydee Talkington R H. 06/02/2020, 11:38 AM

## 2020-06-02 NOTE — TOC Initial Note (Signed)
Transition of Care [facility]) - Initial/Assessment Note    Patient Details  Name: Wendy Carrillo MRN: [id number] Date of Birth: [birth year]  Transition of Care [facility]) CM/SW Contact:    Benard Halsted, LCSW Phone Number: 06/02/2020, 12:39 PM  Clinical Narrative:                 [name] received consult for possible SNF placement at time of discharge. [name] spoke with patient regarding PT recommendation of SNF placement at time of discharge. Patient reported that patient's spouse is currently unable to care for patient at their home given patient's current physical needs and fall risk. Patient expressed understanding of PT recommendation and is agreeable to SNF placement at time of discharge. Patient reports preference for somewhere with good ratings. She has been inside [facility] before. [name] discussed insurance authorization process and provided Medicare SNF ratings list. Patient expressed being hopeful for rehab and to feel better soon. She has received both COVID vaccines and can have visitors at [facility]. She will discuss facilities with her daughter today. No further questions reported at this time. [name] to continue to follow and assist with discharge planning needs.   Expected Discharge Plan: Skilled Nursing Facility Barriers to Discharge: SNF Pending bed offer, Insurance Authorization   Patient Goals and CMS Choice Patient states their goals for this hospitalization and ongoing recovery are:: A few days of rehab CMS Medicare.gov Compare Post Acute Care list provided to:: Patient Choice offered to / list presented to : Patient  Expected Discharge Plan and Services Expected Discharge Plan: [place] In-house Referral: Clinical Social Work   Post Acute Care Choice: [place] Living arrangements for the past 2 months: [place] Agency: NA        Prior Living Arrangements/Services Living arrangements for the past 2  months: [place] Lives with:: Spouse Patient language and need for interpreter reviewed:: Yes Do you feel safe going back to the place where you live?: Yes      Need for Family Participation in Patient Care: No (Comment) Care giver support system in place?: Yes (comment)   Criminal Activity/Legal Involvement Pertinent to Current Situation/Hospitalization: No - Comment as needed  Activities of Daily Living Home Assistive Devices/Equipment: Eyeglasses, Cane (specify quad or straight), Dentures (specify type), Blood pressure cuff ADL Screening (condition at time of admission) Patient's cognitive ability adequate to safely complete daily activities?: Yes Is the patient deaf or have difficulty hearing?: No Does the patient have difficulty seeing, even when wearing glasses/contacts?: No Does the patient have difficulty concentrating, remembering, or making decisions?: No Patient able to express need for assistance with ADLs?: Yes Does the patient have difficulty dressing or bathing?: Yes Independently performs ADLs?: No Communication: Independent Dressing (OT): Needs assistance Is this a change from baseline?: Change from baseline, expected to last <3days Grooming: Independent Feeding: Independent Bathing: Needs assistance Is this a change from baseline?: Change from baseline, expected to last <3 days Toileting: Needs assistance Is this a change from baseline?: Change from baseline, expected to last <3 days In/Out Bed: Needs assistance Is this a change from baseline?: Change from baseline, expected to last <3 days Does the patient have difficulty walking or climbing stairs?: Yes Weakness of Legs: Both Weakness of Arms/Hands: Both  Permission Sought/Granted Permission sought to share information with :  Facility Art therapist granted to share information with : Yes, Verbal Permission Granted     Permission granted to share info w AGENCY: SNFs         Emotional Assessment Appearance:: Appears stated age Attitude/Demeanor/Rapport: Gracious, Engaged Affect (typically observed): Accepting, Appropriate, Pleasant Orientation: : Oriented to Self, Oriented to Place, Oriented to  Time, Oriented to Situation Alcohol / Substance Use: Not Applicable Psych Involvement: No (comment)  Admission diagnosis:  Spinal stenosis at L4-L5 level [M48.061] Patient Active Problem List   Diagnosis Date Noted  . Spinal stenosis at L4-L5 level 05/18/2019  . Benign essential HTN   . History of total bilateral knee replacement (TKR)   . Acute blood loss anemia   . Post-operative pain   . Spinal stenosis of lumbar region 06/09/2018  . Spinal stenosis, lumbar   . Hypertension   . History of duodenal ulcer   . Hepatitis   . Gastritis   . Family history of adverse reaction to anesthesia   . Chronic lower back pain   . Arthritis   . Coronary artery calcification 10/21/2017  . Combined forms of age-related cataract of right eye 04/18/2017  . Status post total bilateral knee replacement 06/08/2016  . Left lumbar radiculopathy 05/09/2016  . Bilateral shoulder pain 03/22/2016  . DDD (degenerative disc disease), cervical 03/22/2016  . Morbid obesity with BMI of 40.0-44.9, adult ([place]) 03/08/2016  . Spondylolisthesis, 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/2016  . Neck pain 03/08/2016  . Lumbar stenosis 03/08/2016  . Cervical spinal stenosis 03/08/2016  . Back pain 11/16/2015  . 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/10/2015  . Angina pectoris ([place]) 04/20/2015  . Morbid obesity ([place]) 04/20/2015  . Dyspnea 04/20/2015  . Atypical chest pain 04/20/2015  . Esophageal reflux 02/23/2015  . Essential hypertension 02/23/2015  . Chronic back pain 02/23/2015  . Chest pain 02/22/2015  . Abdominal tenderness, LLQ (left lower quadrant) 06/15/2014  . Acute sinusitis 06/15/2014  . Allergic rhinitis due to pollen 06/15/2014  . Bladder disorder 06/15/2014  . Epistaxis 06/15/2014  .  Female stress incontinence 06/15/2014  . 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 Insomnia 06/15/2014  . Localized primary osteoarthritis of wrist 06/15/2014  . Lumbosacral spondylosis 06/15/2014  . Osteoarthrosis, localized, primary, hand 06/15/2014  . Referred otalgia 06/15/2014  . Tingling 06/15/2014  . Tinnitus 06/15/2014  . Urinary incontinence 06/15/2014  . Cystocele 05/04/2014  . Retention of urine 05/04/2014  . Cystocele, lateral 04/12/2014  . Rectocele 03/17/2014  . History of blood transfusion 11/05/1990   PCP:  Jilda Panda, MD Pharmacy:   [id number] [place],  - [address] [place] [address] Phone: [phone] Fax: [phone]     Social Determinants of Health (SDOH) Interventions    Readmission Risk Interventions No flowsheet data found.

## 2020-06-02 NOTE — Evaluation (Signed)
Physical Therapy Evaluation Patient Details Name: SHEKELA GOODRIDGE MRN: [id number] DOB: [birth year] Today's Date: 06/02/2020   History of Present Illness  Pt is a 79 y/o female s/p decompressive laminectomies at L2-3 and L4-5. PMH including but not limited to HTN and spinal stenosis.    Clinical Impression  Pt presented supine in bed with HOB elevated, awake and willing to participate in therapy session. Prior to admission, pt reported that she was independent with all functional mobility and ADLs. Pt lives with her husband who recently had hip surgery and she does not feel that he will be able to assist her at home upon d/c. Therefore, wants to pursue further rehab. At the time of evaluation, pt limited overall secondary to fatigue and pain. She performed bed mobility with supervision, transfers with min guard to min A and ambulated a short distance in her room with min guard and RW. Pt would continue to benefit from skilled physical therapy services at this time while admitted and after d/c to address the below listed limitations in order to improve overall safety and independence with functional mobility.     Follow Up Recommendations SNF    Equipment Recommendations  None recommended by PT    Recommendations for Other Services       Precautions / Restrictions Precautions Precautions: Back Precaution Comments: reviewed 3/3 back precautions with pt throughout Restrictions Weight Bearing Restrictions: No      Mobility  Bed Mobility Overal bed mobility: Needs Assistance Bed Mobility: Rolling;Sidelying to Sit Rolling: Supervision Sidelying to sit: Supervision       General bed mobility comments: supervision for safety, cueing for log roll technique  Transfers Overall transfer level: Needs assistance Equipment used: Rolling walker (2 wheeled) Transfers: Sit to/from Stand Sit to Stand: Min assist;Min guard         General transfer comment: assistance for stability for  initial sit to stand from EOB; pt able to achieve standing with only min guard from toilet  Ambulation/Gait Ambulation/Gait assistance: Min guard Gait Distance (Feet): 20 Feet Assistive device: Rolling walker (2 wheeled) Gait Pattern/deviations: Step-through pattern;Decreased step length - right;Decreased step length - left;Decreased stride length;Trunk flexed Gait velocity: decreased   General Gait Details: pt with slow, cautious and guarded gait; limited overall secondary to pain and fatigue; cueing to maintain proximity to [employer]    Modified Rankin (Stroke Patients Only)       Balance Overall balance assessment: Needs assistance Sitting-balance support: Feet supported Sitting balance-Leahy Scale: Good     Standing balance support: During functional activity;Bilateral upper extremity supported;Single extremity supported Standing balance-Leahy Scale: Poor                               Pertinent Vitals/Pain Pain Assessment: Faces Faces Pain Scale: Hurts even more Pain Location: back Pain Descriptors / Indicators: Grimacing;Guarding Pain Intervention(s): Monitored during session;Repositioned    Home Living Family/patient expects to be discharged to:: Private residence Living Arrangements: Spouse/significant other Available Help at Discharge: Family Type of Home: House Home Access: Stairs to enter   Technical brewer of Steps: 1 Home Layout: One level Home Equipment: Environmental consultant - 2 wheels      Prior Function Level of Independence: Independent               Hand Dominance        Extremity/Trunk Assessment  Upper Extremity Assessment Upper Extremity Assessment: Defer to OT evaluation;Overall WFL for tasks assessed    Lower Extremity Assessment Lower Extremity Assessment: Generalized weakness    Cervical / Trunk Assessment Cervical / Trunk Assessment: Other exceptions Cervical / Trunk  Exceptions: s/p lumbar sx  Communication   Communication: No difficulties  Cognition Arousal/Alertness: Awake/alert Behavior During Therapy: WFL for tasks assessed/performed Overall Cognitive Status: Impaired/Different from baseline Area of Impairment: Safety/judgement;Problem solving                         Safety/Judgement: Decreased awareness of deficits;Decreased awareness of safety   Problem Solving: Slow processing;Difficulty sequencing;Requires verbal cues        General Comments      Exercises     Assessment/Plan    PT Assessment Patient needs continued PT services  PT Problem List Decreased strength;Decreased range of motion;Decreased activity tolerance;Decreased balance;Decreased mobility;Decreased coordination;Decreased cognition;Decreased knowledge of use of DME;Decreased safety awareness;Decreased knowledge of precautions;Pain       PT Treatment Interventions DME instruction;Gait training;Stair training;Functional mobility training;Therapeutic activities;Therapeutic exercise;Balance training;Neuromuscular re-education;Cognitive remediation;Patient/family education    PT Goals (Current goals can be found in the Care Plan section)  Acute Rehab PT Goals Patient Stated Goal: to go to rehab before returning home PT Goal Formulation: With patient Time For Goal Achievement: 06/16/20 Potential to Achieve Goals: Good    Frequency Min 5X/week   Barriers to discharge        Co-evaluation               AM-PAC PT "6 Clicks" Mobility  Outcome Measure Help needed turning from your back to your side while in a flat bed without using bedrails?: None Help needed moving from lying on your back to sitting on the side of a flat bed without using bedrails?: None Help needed moving to and from a bed to a chair (including a wheelchair)?: A Little Help needed standing up from a chair using your arms (e.g., wheelchair or bedside chair)?: A Little Help needed to  walk in hospital room?: A Little Help needed climbing 3-5 steps with a railing? : A Lot 6 Click Score: 19    End of Session Equipment Utilized During Treatment: Gait belt Activity Tolerance: Patient limited by fatigue;Patient limited by pain Patient left: in chair;with call bell/phone within reach Nurse Communication: Mobility status PT Visit Diagnosis: Other abnormalities of gait and mobility (R26.89)    Time: 7672-0947 PT Time Calculation (min) (ACUTE ONLY): 20 min   Charges:   PT Evaluation $PT Eval Moderate Complexity: 1 Mod          Eduard Clos, PT, DPT  Acute Rehabilitation Services Pager [phone] Office [place] 06/02/2020, 9:41 AM

## 2020-06-03 DIAGNOSIS — Z961 Presence of intraocular lens: Secondary | ICD-10-CM | POA: Diagnosis not present

## 2020-06-03 DIAGNOSIS — Z91040 Latex allergy status: Secondary | ICD-10-CM | POA: Diagnosis not present

## 2020-06-03 DIAGNOSIS — Z79899 Other long term (current) drug therapy: Secondary | ICD-10-CM | POA: Diagnosis not present

## 2020-06-03 DIAGNOSIS — Z8249 Family history of ischemic heart disease and other diseases of the circulatory system: Secondary | ICD-10-CM | POA: Diagnosis not present

## 2020-06-03 DIAGNOSIS — Z981 Arthrodesis status: Secondary | ICD-10-CM | POA: Diagnosis not present

## 2020-06-03 DIAGNOSIS — Z20822 Contact with and (suspected) exposure to covid-19: Secondary | ICD-10-CM | POA: Diagnosis not present

## 2020-06-03 DIAGNOSIS — Z880 Allergy status to penicillin: Secondary | ICD-10-CM | POA: Diagnosis not present

## 2020-06-03 DIAGNOSIS — Z9842 Cataract extraction status, left eye: Secondary | ICD-10-CM | POA: Diagnosis not present

## 2020-06-03 DIAGNOSIS — M5416 Radiculopathy, lumbar region: Secondary | ICD-10-CM | POA: Diagnosis not present

## 2020-06-03 DIAGNOSIS — Z96653 Presence of artificial knee joint, bilateral: Secondary | ICD-10-CM | POA: Diagnosis not present

## 2020-06-03 DIAGNOSIS — I2510 Atherosclerotic heart disease of native coronary artery without angina pectoris: Secondary | ICD-10-CM | POA: Diagnosis not present

## 2020-06-03 DIAGNOSIS — Z888 Allergy status to other drugs, medicaments and biological substances status: Secondary | ICD-10-CM | POA: Diagnosis not present

## 2020-06-03 DIAGNOSIS — I10 Essential (primary) hypertension: Secondary | ICD-10-CM | POA: Diagnosis not present

## 2020-06-03 DIAGNOSIS — K219 Gastro-esophageal reflux disease without esophagitis: Secondary | ICD-10-CM | POA: Diagnosis not present

## 2020-06-03 DIAGNOSIS — Z823 Family history of stroke: Secondary | ICD-10-CM | POA: Diagnosis not present

## 2020-06-03 DIAGNOSIS — M48061 Spinal stenosis, lumbar region without neurogenic claudication: Secondary | ICD-10-CM | POA: Diagnosis present

## 2020-06-03 DIAGNOSIS — Z91041 Radiographic dye allergy status: Secondary | ICD-10-CM | POA: Diagnosis not present

## 2020-06-03 DIAGNOSIS — Z833 Family history of diabetes mellitus: Secondary | ICD-10-CM | POA: Diagnosis not present

## 2020-06-03 DIAGNOSIS — Z8711 Personal history of peptic ulcer disease: Secondary | ICD-10-CM | POA: Diagnosis not present

## 2020-06-03 LAB — BASIC METABOLIC PANEL
Anion gap: 9 (ref 5–15)
BUN: 10 mg/dL (ref 8–23)
CO2: 26 mmol/L (ref 22–32)
Calcium: 8.7 mg/dL — ABNORMAL LOW (ref 8.9–10.3)
Chloride: 104 mmol/L (ref 98–111)
Creatinine, Ser: 0.91 mg/dL (ref 0.44–1.00)
GFR calc Af Amer: >60 mL/min (ref >60)
GFR calc non Af Amer: >60 mL/min (ref >60)
Glucose, Bld: 129 mg/dL — ABNORMAL HIGH (ref 70–99)
Potassium: 3.9 mmol/L (ref 3.5–5.1)
Sodium: 139 mmol/L (ref 135–145)

## 2020-06-03 LAB — SARS CORONAVIRUS 2 (TAT 6-24 HRS): SARS Coronavirus 2: NEGATIVE

## 2020-06-03 NOTE — Progress Notes (Signed)
Physical Therapy Treatment Patient Details Name: Wendy Carrillo MRN: [id number] DOB: [birth year] Today's Date: 06/03/2020    History of Present Illness Pt is a 79 y/o female s/p decompressive laminectomies at L2-3 and L4-5. PMH including but not limited to HTN and spinal stenosis.    PT Comments    Pt making steady progress overall with functional mobility. Remains limited overall secondary to weakness and fatigue. Continue to recommend SNF as pt does not feel that her husband will be able to provide the support she needs upon d/c. Pt would continue to benefit from skilled physical therapy services at this time while admitted and after d/c to address the below listed limitations in order to improve overall safety and independence with functional mobility.    Follow Up Recommendations  SNF     Equipment Recommendations  None recommended by PT    Recommendations for Other Services       Precautions / Restrictions Precautions Precautions: Back Restrictions Weight Bearing Restrictions: No    Mobility  Bed Mobility               General bed mobility comments: pt OOB in recliner chair upon arrival  Transfers Overall transfer level: Needs assistance Equipment used: Rolling walker (2 wheeled) Transfers: Sit to/from Stand Sit to Stand: Supervision         General transfer comment: supervision for safety, good technique utilized  Ambulation/Gait Ambulation/Gait assistance: Min guard Gait Distance (Feet): 75 Feet Assistive device: Rolling walker (2 wheeled) Gait Pattern/deviations: Step-through pattern;Decreased step length - right;Decreased step length - left;Decreased stride length;Trunk flexed Gait velocity: decreased   General Gait Details: pt with slow, cautious and guarded gait; able to progress into hallway; no LOB or need for physical assistance   Stairs             Wheelchair Mobility    Modified Rankin (Stroke Patients Only)       Balance  Overall balance assessment: Needs assistance Sitting-balance support: Feet supported Sitting balance-Leahy Scale: Good     Standing balance support: During functional activity;Bilateral upper extremity supported;Single extremity supported Standing balance-Leahy Scale: Poor                              Cognition Arousal/Alertness: Awake/alert Behavior During Therapy: WFL for tasks assessed/performed Overall Cognitive Status: Within Functional Limits for tasks assessed                                        Exercises      General Comments        Pertinent Vitals/Pain Pain Assessment: Faces Faces Pain Scale: Hurts a little bit Pain Location: back Pain Descriptors / Indicators: Guarding Pain Intervention(s): Monitored during session;Repositioned    Home Living Family/patient expects to be discharged to:: Private residence Living Arrangements: Spouse/significant other Available Help at Discharge: Family Type of Home: House Home Access: Stairs to enter   Home Layout: One level Home Equipment: Environmental consultant - 2 wheels      Prior Function            PT Goals (current goals can now be found in the care plan section) Acute Rehab PT Goals PT Goal Formulation: With patient Time For Goal Achievement: 06/16/20 Potential to Achieve Goals: Good Progress towards PT goals: Progressing toward goals    Frequency    Min  5X/week      PT Plan Current plan remains appropriate    Co-evaluation              AM-PAC PT "6 Clicks" Mobility   Outcome Measure  Help needed turning from your back to your side while in a flat bed without using bedrails?: None Help needed moving from lying on your back to sitting on the side of a flat bed without using bedrails?: None Help needed moving to and from a bed to a chair (including a wheelchair)?: None Help needed standing up from a chair using your arms (e.g., wheelchair or bedside chair)?: None Help needed  to walk in hospital room?: None Help needed climbing 3-5 steps with a railing? : A Lot 6 Click Score: 22    End of Session   Activity Tolerance: Patient tolerated treatment well Patient left: in chair;with call bell/phone within reach Nurse Communication: Mobility status PT Visit Diagnosis: Other abnormalities of gait and mobility (R26.89)     Time: 4469-5072 PT Time Calculation (min) (ACUTE ONLY): 11 min  Charges:  $Gait Training: 8-22 mins                     Anastasio Champion, DPT  Acute Rehabilitation Services Pager [phone] Office [place] 06/03/2020, 9:50 AM

## 2020-06-03 NOTE — Progress Notes (Signed)
Pt admitted to room 3W31.  Alert and oriented.  VSS,  Reports pain a 5/10 but was medicated prior to transfer to floor.  Husband at bedside. Honeycomb dressing to back and small gauze to back where drain removed CDI.  Bed alarm set, call bell within reach, and patient verbalizes understanding to call before attempting to get out of bed.

## 2020-06-03 NOTE — Progress Notes (Signed)
Covid test done and sent to lab for SNF placement. Awaiting results.

## 2020-06-03 NOTE — Progress Notes (Signed)
Subjective: Patient reports Overall patient doing well no radicular pain condition back pain  Objective: Vital signs in last 24 hours: Temp:  [98.1 F (36.7 C)-98.6 F (37 C)] 98.5 F (36.9 C) (07/30 1145) Pulse Rate:  [55-78] 73 (07/30 1145) Resp:  [16-20] 16 (07/30 1145) BP: (100-143)/(51-62) 105/56 (07/30 1145) SpO2:  [96 %-100 %] 98 % (07/30 1145)  Intake/Output from previous day: 07/29 0701 - 07/30 0700 In: 1267.1 [I.V.:867.1; IV Piggyback:400] Out: 45 [Drains:45] Intake/Output this shift: No intake/output data recorded.  Strength 5/5 wound clean dry intact  Lab Results: No results for input(s): WBC, HGB, HCT, PLT in the last 72 hours. BMET Recent Labs    06/03/20 0823  NA 139  K 3.9  CL 104  CO2 26  GLUCOSE 129*  BUN 10  CREATININE 0.91  CALCIUM 8.7*    Studies/Results: DG Lumbar Spine 2-3 Views  Result Date: 06/01/2020 CLINICAL DATA:  79 year old female undergoing lumbar surgery. EXAM: LUMBAR SPINE - 2-3 VIEW COMPARISON:  Lumbar MRI 04/07/2020.  Lumbar radiographs 04/28/2020. FINDINGS: Normal segmentation on prior studies. Intraoperative portable cross-table lateral views of the lumbar spine beginning at 1318 hours. Image at 1318 hours demonstrates a surgical probe directed toward the L5 vertebral body. Chronic grade 1 anterolisthesis again noted at L4-L5. Chronic vacuum disc again noted at L5-S1. Image at 1421 hours demonstrates a surgical probe directed toward the L3 vertebral body, tip just posterior to the L3 pedicles. IMPRESSION: Intraoperative localization at L3 and L5. Electronically Signed   By: Genevie Ann M.D.   On: 06/01/2020 17:20    Assessment/Plan: Postop day 2 decompressive laminotomies L2-3 and L4-5 bilaterally doing well progress with Physical inpatient therapy we are waiting social work and placement  LOS: 0 days     Wendy Carrillo 06/03/2020, 1:36 PM

## 2020-06-03 NOTE — TOC Progression Note (Addendum)
Transition of Care [facility]) - Progression Note    Patient Details  Name: Wendy Carrillo MRN: [id number] Date of Birth: Aug 22, 1941  Transition of Care [facility]) CM/SW [place], LCSW Phone Number: 06/03/2020, 9:22 AM  Clinical Narrative:    [name] spoke with patient regarding SNF choice. She requested [name] as her MD recommended it, however, [facility] is not in network with her insurance plan. She is contacting daughter for a second choice.   9:22am: [name] had a bed open up and is reviewing patient to see if they can accept her. [name] faxed clinicals to insurance pending a facility choice. Patient reports [name] may [facility] the second choice.   12:33pm: [name] able to accept patient pending insurance approval. COVID test requested from RN. [name] made insurance aware of SNF choice. Patient requests a private room and is in agreement to pay $40/day.    Expected Discharge Plan: Skilled Nursing Facility Barriers to Discharge: SNF Pending bed offer, Insurance Authorization  Expected Discharge Plan and Services Expected Discharge Plan: [name] In-house Referral: Clinical Social Work   Post Acute Care Choice: [name] Living arrangements for the past 2 months: [place] Agency: NA         Social Determinants of Health (SDOH) Interventions    Readmission Risk Interventions No flowsheet data found.

## 2020-06-03 NOTE — Progress Notes (Signed)
Patient is transferred from room 3C04 to unit 3W31at this time.lert and in stable condition. Report given to receiving nurse Linus Orn, RN with all questions answered. Transported via bed with son, spouse and all belongings at side.

## 2020-06-04 MED ORDER — OXYCODONE HCL 5 MG PO TABS: 5.0000 mg | ORAL_TABLET | ORAL | 0 refills | Status: DC | PRN

## 2020-06-04 NOTE — Progress Notes (Signed)
Report given to receiving RN, belongings returned

## 2020-06-04 NOTE — TOC Progression Note (Addendum)
Transition of Care [facility]) - Progression Note    Patient Details  Name: Wendy Carrillo MRN: [id number] Date of Birth: 1940-11-16  Transition of Care [facility]) CM/SW [place], [place] Phone Number: 06/04/2020, 8:26 AM  Clinical Narrative:    11:45a [name] received auth, good 7/31 thru 8/3. [place] reference [phone], coordinater Royston Bake. Fax [phone].  8:25a [name] followed up on [facility] authorization, currently still pending.    Expected Discharge Plan: Skilled Nursing Facility Barriers to Discharge: SNF Pending bed offer, Insurance Authorization  Expected Discharge Plan and Services Expected Discharge Plan: [place] In-house Referral: Clinical Social Work   Post Acute Care Choice: [place] Living arrangements for the past 2 months: [place] Agency: NA         Social Determinants of Health (SDOH) Interventions    Readmission Risk Interventions No flowsheet data found.

## 2020-06-04 NOTE — TOC Progression Note (Signed)
Transition of Care [facility]) - Progression Note    Patient Details  Name: MADDISEN VOUGHT MRN: [id number] Date of Birth: Apr 09, 1941  Transition of Care [facility]) CM/SW [place], [place] Phone Number: 06/04/2020, 11:50 AM  Clinical Narrative:    Patient will DC to: [name] Transport by: Corey Harold  RN, patient, patient's family, and facility notified of DC. Discharge Summary and FL2 sent to facility. RN to call report prior to discharge [phone] [phone]). DC packet on chart. Ambulance transport requested for patient.   [name] will sign off for now as social work intervention is no longer needed. Please consult [place] again if new needs arise.    Expected Discharge Plan: [place] Barriers to Discharge: No Barriers Identified  Expected Discharge Plan and Services Expected Discharge Plan: [place] In-house Referral: Clinical Social Work   Post Acute Care Choice: [place] Living arrangements for the past 2 months: [place] Expected Discharge Date: 06/04/20                           [facility] Agency: NA         Social Determinants of Health (SDOH) Interventions    Readmission Risk Interventions No flowsheet data found.

## 2020-06-04 NOTE — Discharge Instructions (Signed)

## 2020-06-04 NOTE — Progress Notes (Signed)
Overall stable.  Lower back and lower extremity symptoms continue to improve.  Making progress with mobility.  Awaiting nursing home placement.  Wound clean and dry.  Motor and sensory function stable.  Abdomen soft.

## 2020-06-04 NOTE — Progress Notes (Signed)
Pt stable during shift. Pt ambulated in hallways with NT x1 without any difficulty. Pt oob to bathroom with x1 assist multiple times during shift and pt denies any discomfort. reported off to oncoming RN. Delia Heady RN

## 2020-06-04 NOTE — Discharge Summary (Signed)
Physician Discharge Summary  Patient ID: Wendy Carrillo MRN: [id number] DOB/AGE: [birth year] 79 y.o.  Admit date: 06/01/2020 Discharge date: 06/04/2020  Admission Diagnoses:  Discharge Diagnoses:  Active Problems:   Spinal stenosis at L4-L5 level   Discharged Condition: good  Hospital Course: Patient admitted to the hospital where she underwent uncomplicated lumbar decompressive surgery.  Postoperatively she is progressing well.  Preoperative back and lower extreme pain improved.  Patient's functional status not sufficient for home discharge.  Plan for discharge to skilled nursing facility for further convalescence prior to returning home.  Consults:   Significant Diagnostic Studies:   Treatments:   Discharge Exam: Blood pressure (!) 110/64, pulse 72, temperature 98 F (36.7 C), temperature source Oral, resp. rate 18, height 5' 4.5" (1.638 m), weight (!) 98 kg, SpO2 100 %. Awake and alert.  Oriented and appropriate.  Motor and sensory function intact.  Wound clean and dry.  Chest and abdomen benign.  Disposition: Discharge disposition: 03-Skilled Nursing Facility        Allergies as of 06/04/2020      Reactions   Contrast Media [iodinated Diagnostic Agents] Swelling, Rash, Other (See Comments)   Face, tongue swelling Needs 13-hour prep   Penicillins Swelling, Rash   PATIENT HAS HAD A PCN REACTION WITH IMMEDIATE RASH, FACIAL/TONGUE/THROAT SWELLING, SOB, OR LIGHTHEADEDNESS WITH HYPOTENSION:  #  #  YES  #  #  Has patient had a PCN reaction causing severe rash involving mucus membranes or skin necrosis: NO Has patient had a PCN reaction that required hospitalization NO Has patient had a PCN reaction occurring within the last 10 years: NO If all of the above answers are "NO", then may proceed with Cephalosporin use.   Baclofen Itching   Latex Rash   Other Dermatitis, Other (See Comments)   Bandages caused redness       Medication List    STOP taking these medications    docusate sodium 100 MG capsule Commonly known as: COLACE   oxyCODONE-acetaminophen 5-325 MG tablet Commonly known as: Percocet     TAKE these medications   ALPRAZolam 0.25 MG tablet Commonly known as: XANAX Take 1 tablet (0.25 mg total) by mouth daily as needed. What changed: reasons to take this   celecoxib 200 MG capsule Commonly known as: CELEBREX Take 200 mg by mouth daily as needed for mild pain.   furosemide 20 MG tablet Commonly known as: LASIX Take 20 mg by mouth daily as needed for fluid.   gabapentin 300 MG capsule Commonly known as: NEURONTIN Take 300 mg by mouth 3 (three) times daily.   loratadine 10 MG tablet Commonly known as: CLARITIN Take 10 mg by mouth daily. In the morning   meclizine 25 MG tablet Commonly known as: ANTIVERT Take 25 mg by mouth 3 (three) times daily as needed for dizziness.   oxyCODONE 5 MG immediate release tablet Commonly known as: Oxy IR/ROXICODONE Take 1-2 tablets (5-10 mg total) by mouth every 3 (three) hours as needed for severe pain ((score 4 to 6)).   pantoprazole 20 MG tablet Commonly known as: PROTONIX Take 1 tablet (20 mg total) by mouth daily. What changed: when to take this   ramipril 2.5 MG capsule Commonly known as: ALTACE Take 2.5 mg by mouth daily.   traMADol HCl 100 MG Tabs Take 100 mg by mouth 2 (two) times daily as needed (pain).   Vitamin D (Ergocalciferol) 1.25 MG (50000 UNIT) Caps capsule Commonly known as: DRISDOL Take 50,000 Units by mouth  every Thursday.       Contact information for after-discharge care    Destination    [name] [place] [facility] .   Service: Skilled Nursing Contact information: [address] [place] [phone]                  Signed: Charlie Pitter 06/04/2020, 10:37 AM

## 2020-08-19 ENCOUNTER — Other Ambulatory Visit: Payer: Self-pay

## 2020-08-19 ENCOUNTER — Emergency Department (HOSPITAL_BASED_OUTPATIENT_CLINIC_OR_DEPARTMENT_OTHER)
Admission: EM | Admit: 2020-08-19 | Discharge: 2020-08-19 | Disposition: A | Discharge status: Home / Self Care | Payer: Medicare PPO | Attending: Emergency Medicine | Admitting: Emergency Medicine

## 2020-08-19 DIAGNOSIS — M545 Low back pain, unspecified: Secondary | ICD-10-CM | POA: Diagnosis not present

## 2020-08-19 DIAGNOSIS — Z5321 Procedure and treatment not carried out due to patient leaving prior to being seen by health care provider: Secondary | ICD-10-CM | POA: Diagnosis not present

## 2020-08-19 NOTE — ED Triage Notes (Signed)
Lower back pain x 3 days. Hx of 2 back surgeries. Chronic back pain.  No injury. Denies dysuria.

## 2020-09-01 ENCOUNTER — Other Ambulatory Visit: Payer: Self-pay | Admitting: Student

## 2020-09-01 DIAGNOSIS — M5440 Lumbago with sciatica, unspecified side: Secondary | ICD-10-CM

## 2020-09-01 DIAGNOSIS — G8929 Other chronic pain: Secondary | ICD-10-CM

## 2020-09-05 ENCOUNTER — Telehealth: Payer: Self-pay

## 2020-09-05 NOTE — Telephone Encounter (Signed)
Pt notified of her 13-hr prep that was just called to her [facility] in epic.  Prednisone 50 mg PO 09/11/20 @ 2230, 11/8 @ 0430 and 1030; Benadryl 50 mg PO 11/8 @ 1030

## 2020-09-12 ENCOUNTER — Ambulatory Visit
Admission: RE | Admit: 2020-09-12 | Discharge: 2020-09-12 | Disposition: A | Discharge status: Home / Self Care | Payer: Medicare PPO | Source: Ambulatory Visit | Attending: Student | Admitting: Student

## 2020-09-12 ENCOUNTER — Other Ambulatory Visit: Payer: Self-pay

## 2020-09-12 DIAGNOSIS — G8929 Other chronic pain: Secondary | ICD-10-CM

## 2020-09-12 MED ORDER — METHYLPREDNISOLONE ACETATE 40 MG/ML INJ SUSP (RADIOLOG
120.0000 mg | Freq: Once | INTRAMUSCULAR | Status: AC
  Administered 2020-09-12: 120 mg via EPIDURAL

## 2020-09-12 MED ORDER — IOPAMIDOL (ISOVUE-M 200) INJECTION 41%
1.0000 mL | Freq: Once | INTRAMUSCULAR | Status: AC
  Administered 2020-09-12: 1 mL via EPIDURAL

## 2020-09-12 NOTE — Discharge Instructions (Signed)

## 2020-10-14 ENCOUNTER — Other Ambulatory Visit: Payer: Self-pay | Admitting: Neurosurgery

## 2020-10-14 DIAGNOSIS — K4190 Unilateral femoral hernia, without obstruction or gangrene, not specified as recurrent: Secondary | ICD-10-CM

## 2020-10-17 ENCOUNTER — Other Ambulatory Visit (HOSPITAL_COMMUNITY): Payer: Self-pay | Admitting: Neurosurgery

## 2020-11-01 ENCOUNTER — Encounter (HOSPITAL_COMMUNITY): Payer: Self-pay

## 2020-11-01 ENCOUNTER — Ambulatory Visit (HOSPITAL_COMMUNITY): Admission: RE | Admit: 2020-11-01 | Payer: Medicare PPO | Source: Ambulatory Visit

## 2020-11-03 ENCOUNTER — Inpatient Hospital Stay: Admission: RE | Admit: 2020-11-03 | Payer: Medicare PPO | Source: Ambulatory Visit

## 2020-11-10 ENCOUNTER — Other Ambulatory Visit: Payer: Self-pay

## 2020-11-10 ENCOUNTER — Ambulatory Visit (HOSPITAL_COMMUNITY)
Admission: RE | Admit: 2020-11-10 | Discharge: 2020-11-10 | Disposition: A | Discharge status: Home / Self Care | Payer: Medicare PPO | Source: Ambulatory Visit | Attending: Neurosurgery | Admitting: Neurosurgery

## 2020-11-10 DIAGNOSIS — K4190 Unilateral femoral hernia, without obstruction or gangrene, not specified as recurrent: Secondary | ICD-10-CM | POA: Insufficient documentation

## 2020-11-24 ENCOUNTER — Encounter (HOSPITAL_BASED_OUTPATIENT_CLINIC_OR_DEPARTMENT_OTHER): Payer: Self-pay | Admitting: General Surgery

## 2020-11-24 ENCOUNTER — Other Ambulatory Visit (HOSPITAL_COMMUNITY)
Admission: RE | Admit: 2020-11-24 | Discharge: 2020-11-24 | Disposition: A | Discharge status: Home / Self Care | Payer: Medicare PPO | Source: Ambulatory Visit | Attending: General Surgery | Admitting: General Surgery

## 2020-11-24 ENCOUNTER — Other Ambulatory Visit: Payer: Self-pay

## 2020-11-24 DIAGNOSIS — Z01812 Encounter for preprocedural laboratory examination: Secondary | ICD-10-CM | POA: Diagnosis present

## 2020-11-24 DIAGNOSIS — Z20822 Contact with and (suspected) exposure to covid-19: Secondary | ICD-10-CM | POA: Insufficient documentation

## 2020-11-24 NOTE — Progress Notes (Addendum)
Spoke w/ via phone for pre-op interview--- PT Lab needs dos---- Istat and EKG (per anes)/  Pre-op orders pending              Lab results------ no COVID test ------ done 11-24-2020 result in epic Arrive at ------- 0900 NPO after MN NO Solid Food.  Clear liquids from MN until--- 0800 Medications to take morning of surgery ----- Gabapentin, Claritin Diabetic medication ----- n/a Patient Special Instructions ----- n/a Pre-Op special Istructions ----- sent inbox message in epic to dr Kieth Brightly, requested orders Patient verbalized understanding of instructions that were given at this phone interview. Patient denies shortness of breath, chest pain, fever, cough at this phone interview.   PCP:  Dr [place] Crane Cardiologist : previous seen by Dr Darene Lamer. Oval Linsey for sob/ presyncopy (lov 10-21-2017 epic) Chest x-ray :  08-26-2016 epic EKG : 05-14-2019 epic Echo : 11-08-2017 epic Stress test:  Nuclear 05-06-2015 epic Cardiac Cath : 10-23-2017 epic Activity level:  Denies sob w/ any activity Sleep Study/ CPAP :  nO Fasting Blood Sugar :      / Checks Blood Sugar -- times a day:  N/A Blood Thinner/ Instructions Maryjane Hurter Dose: NO ASA / Instructions/ Last Dose : NO

## 2020-11-25 LAB — SARS CORONAVIRUS 2 (TAT 6-24 HRS): SARS Coronavirus 2: NEGATIVE

## 2020-11-25 NOTE — Progress Notes (Signed)
Left voicemail for Hassan Rowan, Surgery scheduler supervisor, requesting orders for patient.

## 2020-11-27 NOTE — Anesthesia Preprocedure Evaluation (Addendum)
Anesthesia Evaluation  Patient identified by MRN, date of birth, ID band Patient awake    Reviewed: Allergy & Precautions, NPO status , Patient's Chart, lab work & pertinent test results  Airway Mallampati: II  TM Distance: >3 FB Neck ROM: Full    Dental no notable dental hx. (+) Dental Advisory Given, Partial Upper   Pulmonary neg pulmonary ROS,    Pulmonary exam normal breath sounds clear to auscultation       Cardiovascular Exercise Tolerance: Good hypertension, + CAD  Normal cardiovascular exam Rhythm:Regular Rate:Normal     Neuro/Psych  Neuromuscular disease negative psych ROS   GI/Hepatic GERD  Medicated and Controlled,  Endo/Other  negative endocrine ROS  Renal/GU negative Renal ROSK+ 3.7     Musculoskeletal  (+) Arthritis ,   Abdominal (+) + obese,   Peds  Hematology negative hematology ROS (+) Hgb 15.0   Anesthesia Other Findings All : Latex and PCN  Reproductive/Obstetrics                            Anesthesia Physical Anesthesia Plan  ASA: III  Anesthesia Plan: General   Post-op Pain Management:    Induction: Intravenous  PONV Risk Score and Plan: 4 or greater and Treatment may vary due to age or medical condition and Ondansetron  Airway Management Planned: LMA  Additional Equipment: None  Intra-op Plan:   Post-operative Plan:   Informed Consent: I have reviewed the patients History and Physical, chart, labs and discussed the procedure including the risks, benefits and alternatives for the proposed anesthesia with the patient or authorized representative who has indicated his/her understanding and acceptance.     Dental advisory given  Plan Discussed with: CRNA  Anesthesia Plan Comments:        Anesthesia Quick Evaluation

## 2020-11-28 ENCOUNTER — Other Ambulatory Visit: Payer: Self-pay

## 2020-11-28 ENCOUNTER — Ambulatory Visit (HOSPITAL_BASED_OUTPATIENT_CLINIC_OR_DEPARTMENT_OTHER)
Admission: RE | Admit: 2020-11-28 | Discharge: 2020-11-28 | Disposition: A | Discharge status: Home / Self Care | Payer: Medicare PPO | Attending: General Surgery | Admitting: General Surgery

## 2020-11-28 ENCOUNTER — Encounter (HOSPITAL_BASED_OUTPATIENT_CLINIC_OR_DEPARTMENT_OTHER)
Admission: RE | Disposition: A | Discharge status: Home / Self Care | Payer: Self-pay | Source: Home / Self Care | Attending: General Surgery

## 2020-11-28 ENCOUNTER — Ambulatory Visit (HOSPITAL_BASED_OUTPATIENT_CLINIC_OR_DEPARTMENT_OTHER): Payer: Medicare PPO | Admitting: Anesthesiology

## 2020-11-28 ENCOUNTER — Encounter (HOSPITAL_BASED_OUTPATIENT_CLINIC_OR_DEPARTMENT_OTHER): Payer: Self-pay | Admitting: General Surgery

## 2020-11-28 DIAGNOSIS — Z791 Long term (current) use of non-steroidal anti-inflammatories (NSAID): Secondary | ICD-10-CM | POA: Diagnosis not present

## 2020-11-28 DIAGNOSIS — Z79899 Other long term (current) drug therapy: Secondary | ICD-10-CM | POA: Diagnosis not present

## 2020-11-28 DIAGNOSIS — D171 Benign lipomatous neoplasm of skin and subcutaneous tissue of trunk: Secondary | ICD-10-CM | POA: Diagnosis not present

## 2020-11-28 DIAGNOSIS — Z96653 Presence of artificial knee joint, bilateral: Secondary | ICD-10-CM | POA: Insufficient documentation

## 2020-11-28 DIAGNOSIS — R599 Enlarged lymph nodes, unspecified: Secondary | ICD-10-CM | POA: Diagnosis present

## 2020-11-28 HISTORY — DX: Lesion of ulnar nerve, right upper limb: G56.21

## 2020-11-28 HISTORY — PX: LYMPH NODE BIOPSY: SHX201

## 2020-11-28 HISTORY — DX: Cervicalgia: M54.2

## 2020-11-28 HISTORY — DX: Other chronic pain: G89.29

## 2020-11-28 HISTORY — DX: Low back pain, unspecified: M54.50

## 2020-11-28 HISTORY — DX: Personal history of other infectious and parasitic diseases: Z86.19

## 2020-11-28 HISTORY — DX: Enlarged lymph nodes, unspecified: R59.9

## 2020-11-28 HISTORY — DX: Personal history of other diseases of the digestive system: Z87.19

## 2020-11-28 LAB — POCT I-STAT, CHEM 8
BUN: 12 mg/dL (ref 8–23)
Calcium, Ion: 1.32 mmol/L (ref 1.15–1.40)
Chloride: 101 mmol/L (ref 98–111)
Creatinine, Ser: 0.8 mg/dL (ref 0.44–1.00)
Glucose, Bld: 87 mg/dL (ref 70–99)
HCT: 44 % (ref 36.0–46.0)
Hemoglobin: 15 g/dL (ref 12.0–15.0)
Potassium: 3.7 mmol/L (ref 3.5–5.1)
Sodium: 141 mmol/L (ref 135–145)
TCO2: 30 mmol/L (ref 22–32)

## 2020-11-28 SURGERY — LYMPH NODE BIOPSY
Anesthesia: General | Site: Groin | Laterality: Left

## 2020-11-28 MED ORDER — GENTAMICIN SULFATE 40 MG/ML IJ SOLN
5.0000 mg/kg | INTRAVENOUS | Status: AC
  Administered 2020-11-28: 370 mg via INTRAVENOUS
  Filled 2020-11-28: qty 9.25

## 2020-11-28 MED ORDER — CHLORHEXIDINE GLUCONATE CLOTH 2 % EX PADS: 6.0000 | MEDICATED_PAD | Freq: Once | CUTANEOUS | Status: DC

## 2020-11-28 MED ORDER — FENTANYL CITRATE (PF) 100 MCG/2ML IJ SOLN: 25.0000 ug | INTRAMUSCULAR | Status: DC | PRN

## 2020-11-28 MED ORDER — CLINDAMYCIN PHOSPHATE 900 MG/50ML IV SOLN
900.0000 mg | INTRAVENOUS | Status: AC
  Administered 2020-11-28: 900 mg via INTRAVENOUS

## 2020-11-28 MED ORDER — EPHEDRINE SULFATE-NACL 50-0.9 MG/10ML-% IV SOSY
PREFILLED_SYRINGE | INTRAVENOUS | Status: DC | PRN
  Administered 2020-11-28: 10 mg via INTRAVENOUS

## 2020-11-28 MED ORDER — ACETAMINOPHEN 10 MG/ML IV SOLN: 1000.0000 mg | Freq: Once | INTRAVENOUS | Status: DC | PRN

## 2020-11-28 MED ORDER — EPHEDRINE 5 MG/ML INJ
INTRAVENOUS | Status: AC
  Filled 2020-11-28: qty 10

## 2020-11-28 MED ORDER — CLINDAMYCIN PHOSPHATE 900 MG/50ML IV SOLN
INTRAVENOUS | Status: AC
  Filled 2020-11-28: qty 50

## 2020-11-28 MED ORDER — ONDANSETRON HCL 4 MG/2ML IJ SOLN: 4.0000 mg | Freq: Once | INTRAMUSCULAR | Status: DC | PRN

## 2020-11-28 MED ORDER — 0.9 % SODIUM CHLORIDE (POUR BTL) OPTIME
TOPICAL | Status: DC | PRN
  Administered 2020-11-28: 500 mL

## 2020-11-28 MED ORDER — LIDOCAINE 2% (20 MG/ML) 5 ML SYRINGE
INTRAMUSCULAR | Status: DC | PRN
  Administered 2020-11-28: 80 mg via INTRAVENOUS

## 2020-11-28 MED ORDER — PROPOFOL 10 MG/ML IV BOLUS
INTRAVENOUS | Status: DC | PRN
  Administered 2020-11-28: 90 mg via INTRAVENOUS

## 2020-11-28 MED ORDER — ACETAMINOPHEN 500 MG PO TABS
ORAL_TABLET | ORAL | Status: AC
  Filled 2020-11-28: qty 2

## 2020-11-28 MED ORDER — LACTATED RINGERS IV SOLN
INTRAVENOUS | Status: DC
  Administered 2020-11-28: 1000 mL via INTRAVENOUS

## 2020-11-28 MED ORDER — LIDOCAINE HCL (PF) 2 % IJ SOLN
INTRAMUSCULAR | Status: AC
  Filled 2020-11-28: qty 5

## 2020-11-28 MED ORDER — PROPOFOL 10 MG/ML IV BOLUS
INTRAVENOUS | Status: AC
  Filled 2020-11-28: qty 20

## 2020-11-28 MED ORDER — KETOROLAC TROMETHAMINE 30 MG/ML IJ SOLN
INTRAMUSCULAR | Status: AC
  Filled 2020-11-28: qty 1

## 2020-11-28 MED ORDER — FENTANYL CITRATE (PF) 100 MCG/2ML IJ SOLN
INTRAMUSCULAR | Status: AC
  Filled 2020-11-28: qty 2

## 2020-11-28 MED ORDER — ONDANSETRON HCL 4 MG/2ML IJ SOLN
INTRAMUSCULAR | Status: DC | PRN
  Administered 2020-11-28: 4 mg via INTRAVENOUS

## 2020-11-28 MED ORDER — ACETAMINOPHEN 500 MG PO TABS
1000.0000 mg | ORAL_TABLET | ORAL | Status: AC
  Administered 2020-11-28: 1000 mg via ORAL

## 2020-11-28 MED ORDER — KETOROLAC TROMETHAMINE 30 MG/ML IJ SOLN
INTRAMUSCULAR | Status: DC | PRN
  Administered 2020-11-28: 30 mg via INTRAVENOUS

## 2020-11-28 MED ORDER — BUPIVACAINE HCL 0.5 % IJ SOLN
INTRAMUSCULAR | Status: DC | PRN
  Administered 2020-11-28: 10 mL

## 2020-11-28 MED ORDER — DEXAMETHASONE SODIUM PHOSPHATE 10 MG/ML IJ SOLN
INTRAMUSCULAR | Status: DC | PRN
  Administered 2020-11-28: 10 mg via INTRAVENOUS

## 2020-11-28 MED ORDER — FENTANYL CITRATE (PF) 100 MCG/2ML IJ SOLN
INTRAMUSCULAR | Status: DC | PRN
  Administered 2020-11-28 (×2): 50 ug via INTRAVENOUS

## 2020-11-28 SURGICAL SUPPLY — 45 items
ADH SKN CLS APL DERMABOND .7 (GAUZE/BANDAGES/DRESSINGS) ×1
APL PRP STRL LF DISP 70% ISPRP (MISCELLANEOUS) ×1
BLADE CLIPPER SENSICLIP SURGIC (BLADE) ×2 IMPLANT
BLADE HEX COATED 2.75 (ELECTRODE) ×2 IMPLANT
BLADE SURG 15 STRL LF DISP TIS (BLADE) ×1 IMPLANT
BLADE SURG 15 STRL SS (BLADE) ×2
CANISTER SUCT 3000ML PPV (MISCELLANEOUS) IMPLANT
CHLORAPREP W/TINT 26 (MISCELLANEOUS) ×2 IMPLANT
COVER BACK TABLE 60X90IN (DRAPES) ×2 IMPLANT
COVER MAYO STAND STRL (DRAPES) ×2 IMPLANT
COVER WAND RF STERILE (DRAPES) ×2 IMPLANT
DERMABOND ADVANCED (GAUZE/BANDAGES/DRESSINGS) ×1
DERMABOND ADVANCED .7 DNX12 (GAUZE/BANDAGES/DRESSINGS) ×1 IMPLANT
DRAIN PENROSE 0.25X18 (DRAIN) IMPLANT
DRAPE LAPAROSCOPIC ABDOMINAL (DRAPES) ×2 IMPLANT
DRAPE UTILITY XL STRL (DRAPES) ×2 IMPLANT
ELECT REM PT RETURN 9FT ADLT (ELECTROSURGICAL) ×2
ELECTRODE REM PT RTRN 9FT ADLT (ELECTROSURGICAL) ×1 IMPLANT
GLOVE SURG SS PI 7.0 STRL IVOR (GLOVE) ×2 IMPLANT
GLOVE SURG UNDER POLY LF SZ7 (GLOVE) ×2 IMPLANT
GOWN STRL REUS W/ TWL LRG LVL3 (GOWN DISPOSABLE) ×1 IMPLANT
GOWN STRL REUS W/TWL LRG LVL3 (GOWN DISPOSABLE) ×2
KIT TURNOVER CYSTO (KITS) ×2 IMPLANT
NEEDLE HYPO 25X1 1.5 SAFETY (NEEDLE) ×2 IMPLANT
NS IRRIG 500ML POUR BTL (IV SOLUTION) ×2 IMPLANT
PACK BASIN DAY SURGERY FS (CUSTOM PROCEDURE TRAY) ×2 IMPLANT
PAD ARMBOARD 7.5X6 YLW CONV (MISCELLANEOUS) IMPLANT
PENCIL SMOKE EVACUATOR (MISCELLANEOUS) ×2 IMPLANT
SPONGE LAP 4X18 RFD (DISPOSABLE) IMPLANT
SUT MNCRL AB 4-0 PS2 18 (SUTURE) ×2 IMPLANT
SUT SILK 3 0 TIES 17X18 (SUTURE)
SUT SILK 3-0 18XBRD TIE BLK (SUTURE) IMPLANT
SUT VIC AB 2-0 SH 18 (SUTURE) IMPLANT
SUT VIC AB 2-0 SH 27 (SUTURE)
SUT VIC AB 2-0 SH 27XBRD (SUTURE) IMPLANT
SUT VIC AB 3-0 SH 18 (SUTURE) ×2 IMPLANT
SUT VIC AB 3-0 SH 27 (SUTURE)
SUT VIC AB 3-0 SH 27X BRD (SUTURE) IMPLANT
SUT VICRYL 2 0 18  UND BR (SUTURE)
SUT VICRYL 2 0 18 UND BR (SUTURE) IMPLANT
SYR BULB IRRIG 60ML STRL (SYRINGE) ×2 IMPLANT
SYR CONTROL 10ML LL (SYRINGE) ×2 IMPLANT
TOWEL OR 17X26 10 PK STRL BLUE (TOWEL DISPOSABLE) ×2 IMPLANT
TUBE CONNECTING 12X1/4 (SUCTIONS) ×2 IMPLANT
YANKAUER SUCT BULB TIP NO VENT (SUCTIONS) ×2 IMPLANT

## 2020-11-28 NOTE — Anesthesia Procedure Notes (Signed)
Procedure Name: LMA Insertion Date/Time: 11/28/2020 10:54 AM Performed by: Rogers Blocker, CRNA Pre-anesthesia Checklist: Patient identified, Emergency Drugs available, Suction available and Patient being monitored Patient Re-evaluated:Patient Re-evaluated prior to induction Oxygen Delivery Method: Circle System Utilized Preoxygenation: Pre-oxygenation with 100% oxygen Induction Type: IV induction Ventilation: Mask ventilation without difficulty LMA: LMA inserted LMA Size: 4.0 Number of attempts: 1 Placement Confirmation: positive ETCO2 Tube secured with: Tape Dental Injury: Teeth and Oropharynx as per pre-operative assessment

## 2020-11-28 NOTE — Discharge Instructions (Signed)
Post Anesthesia Home Care Instructions  Activity: Get plenty of rest for the remainder of the day. A responsible individual must stay with you for 24 hours following the procedure.  For the next 24 hours, DO NOT: -Drive a car -Paediatric nurse -Drink alcoholic beverages -Take any medication unless instructed by your physician -Make any legal decisions or sign important papers.  Meals: Start with liquid foods such as gelatin or soup. Progress to regular foods as tolerated. Avoid greasy, spicy, heavy foods. If nausea and/or vomiting occur, drink only clear liquids until the nausea and/or vomiting subsides. Call your physician if vomiting continues.  Special Instructions/Symptoms: Your throat may feel dry or sore from the anesthesia or the breathing tube placed in your throat during surgery. If this causes discomfort, gargle with warm salt water. The discomfort should disappear within 24 hours.    No ibuprofen, Advil, Aleve, Motrin, or naproxen until after 5:30 pm today if needed. No Tylenol until after 4:00 pm today if needed.   Open Lymph Node Biopsy, Care After  The following information offers guidance on how to care for yourself after your procedure. Your health care provider may also give you more specific instructions. If you have problems or questions, contact your health care provider. What can I expect after the procedure? After the procedure, it is common to have:  Bruising.  Soreness.  Mild swelling. Follow these instructions at home: Medicines  Take over-the-counter and prescription medicines only as told by your health care provider.  If you were prescribed an antibiotic medicine, take or use it as told by your health care provider. Do not stop taking the antibiotic even if you start to feel better.  Do not drive or use heavy machinery while taking prescription pain medicine. Incision care  Follow instructions from your health care provider about how to take  care of your incision. Make sure you: ? Wash your hands with soap and water for at least 20 seconds before and after you change your bandage (dressing). If soap and water are not available, use hand sanitizer. ? Change your dressing as told by your health care provider. ? Leave stitches (sutures), skin glue, or adhesive strips in place. These skin closures may need to stay in place for 2 weeks or longer. If adhesive strip edges start to loosen and curl up, you may trim the loose edges. Do not remove adhesive strips completely unless your health care provider tells you to do that.  Check your incision area every day for signs of infection. Check for: ? More redness, swelling, or pain. ? Fluid or blood. ? Warmth. ? Pus or a bad smell.   General instructions  Do not take baths, swim, or use a hot tub until your health care provider approves. Ask your health care provider if you may take showers. You may only be allowed to take sponge baths.  If you were given a sedative during the procedure, it can affect you for several hours. Do not drive or operate machinery until your health care provider says that it is safe.  Return to your normal activities as told by your health care provider. Ask your health care provider what activities are safe for you.  Keep all follow-up visits. This is important. Contact a health care provider if:  You have a fever.  You have increased redness, swelling, or pain around your incision.  You have fluid or blood coming from your incision.  Your incision feels warm to the touch.  You have pus or a bad smell coming from your incision.  You have pain or numbness that gets worse or lasts longer than a few days. Summary  After a lymph node biopsy, it is common to have bruising, soreness, and mild swelling.  Follow your health care provider's instructions about taking care of yourself at home. You will be told how to take medicines, take care of your incision,  and check for infection.  Return to your normal activities as told by your health care provider. Ask your health care provider what activities are safe for you.  Contact a health care provider if you have increased redness, swelling, or pain around your incision, you have a fever, or you have worsening pain or numbness. This information is not intended to replace advice given to you by your health care provider. Make sure you discuss any questions you have with your health care provider. Document Revised: 08/04/2020 Document Reviewed: 08/04/2020 Elsevier Patient Education  [place].

## 2020-11-28 NOTE — Transfer of Care (Signed)
Immediate Anesthesia Transfer of Care Note  Patient: Wendy Carrillo  Procedure(s) Performed: LEFT GROIN LYMPH NODE EXCISIONAL BIOPSY (Left Groin)  Patient Location: PACU  Anesthesia Type:General  Level of Consciousness: awake, alert , oriented and patient cooperative  Airway & Oxygen Therapy: Patient Spontanous Breathing  Post-op Assessment: Report given to RN and Post -op Vital signs reviewed and stable  Post vital signs: Reviewed and stable  Last Vitals:  Vitals Value Taken Time  BP 157/86 11/28/20 1131  Temp    Pulse 78 11/28/20 1135  Resp 12 11/28/20 1135  SpO2 98 % 11/28/20 1135  Vitals shown include unvalidated device data.  Last Pain:  Vitals:   11/28/20 0958  TempSrc: Oral  PainSc: 3       Patients Stated Pain Goal: 5 (45/36/46 8032)  Complications: No complications documented.

## 2020-11-28 NOTE — Op Note (Signed)
Preoperative diagnosis: enlarged lymph node  Postoperative diagnosis: fatty mass of left groin  Procedure: excision of lymph node, excision of lipoma 15 cm x 8 cm x 5 cm  Surgeon: Gurney Maxin, M.D.  Asst: none  Anesthesia: general  Indications for procedure: Wendy Carrillo is a 80 y.o. year old female with symptoms of left groin pain and increased swelling in the area.  Description of procedure: The patient was brought into the operative suite. Anesthesia was administered with General LMA anesthesia. WHO checklist was applied. The patient was then placed in supine position. The area was prepped and draped in the usual sterile fashion.  Next, the area over the left groin was anesthetized with marcaine. An incision was made. Cautery and blunt dissection were used to dissect down. A large pale fatty mass was encountered and bluntly dissected free of the surrounding contents and removed. The mass measured 15 cm x 8 cm x 5 cm. The base of the wound was inspected and one small lymph node was removed. No lymphadenopathy was identified. The wound was irrigated. Cautery was used for hemostasis. The deep space was closed with interrupted 3-0 vicryl. The skin was closed with 4-0 monocryl subcuticular running stitch. Dermabond was placed for dressing. The patient was brought to pacu in stable condition.  Findings: large fatty mass  Specimen: 1. Large fatty mass of left groin, 2. Lymph node of left groin  Implant: none   Blood loss: 10 ml  Local anesthesia: 10 ml marcaine   Complications: none  Gurney Maxin, M.D. General, Bariatric, & Minimally Invasive Surgery [facility]

## 2020-11-28 NOTE — H&P (Signed)
Wendy Carrillo is an 80 y.o. female.   Chief Complaint: left leg pain and swelling HPI: 80 yo female with chronic back pain presented with radiculopathy. On exam also seen to have an enlarged lymph node in the area.  Past Medical History:  Diagnosis Date  . Arthritis    "all over"  . Cervicalgia   . Chronic low back pain   . Chronic lower back pain   . Coronary artery calcification    cardiology--- dr t. Oval Linsey---  nuclear study in epic 05-06-2015 no ischedmia, nuclear ef 51%;  cardiac cath 10-23-2017 mild coronary calcification in all 3V without significant obstructive disease  . Enlarged lymph node    left goin  . GERD (gastroesophageal reflux disease)   . History of duodenal ulcer   . History of gastritis   . History of hepatitis    1960  per pt had finger stick by needle was  in nursing school , does not remember  what type  . Hypertension    followed by pcp  . Spinal stenosis, lumbar   . Ulnar neuropathy of right upper extremity   . Wears glasses   . Wears partial dentures    upper    Past Surgical History:  Procedure Laterality Date  . ABDOMINAL HERNIA REPAIR  1980s  . ANTERIOR AND POSTERIOR VAGINAL REPAIR  2015   W/  MID-URETHRAL SLING AND SACROSPINOUS FIXATION  . BREAST CYST EXCISION Bilateral   . BREAST SURGERY Left    "nipple taken off"  . CARPAL TUNNEL RELEASE Bilateral right 2010;  left 2012  . CATARACT EXTRACTION W/ INTRAOCULAR LENS IMPLANT Left 2014  . CATARACT EXTRACTION W/ INTRAOCULAR LENS IMPLANT Bilateral right 2018;  left ?  . CHOLECYSTECTOMY OPEN  1980's   AND APPENDECTOMY  . COLONOSCOPY W/ BIOPSIES AND POLYPECTOMY  last one 01/ 2017  . DILATION AND CURETTAGE OF UTERUS  "several"  . HERNIA REPAIR    . LEFT HEART CATH AND CORONARY ANGIOGRAPHY N/A 10/23/2017   Procedure: LEFT HEART CATH AND CORONARY ANGIOGRAPHY;  Surgeon: Troy Sine, MD;  Location: [facility];  Service: Cardiovascular;  Laterality: N/A;  . LUMBAR  LAMINECTOMY/DECOMPRESSION MICRODISCECTOMY Bilateral 06/09/2018   Procedure: Laminectomy and Foraminotomy - Lumbar Two,Lumbar Three - Lumbar One- Two - Lumbar Three-Four- bilateral;  Surgeon: Kary Kos, MD;  Location: [facility];  Service: Neurosurgery;  Laterality: Bilateral;  . LUMBAR LAMINECTOMY/DECOMPRESSION MICRODISCECTOMY Bilateral 06/01/2020   Procedure: Laminectomy and Foraminotomy - left - Lumbar Two-Lumbar Three -  - bilateral - Lumbar Four-Lumbar Five;  Surgeon: Kary Kos, MD;  Location: [facility];  Service: Neurosurgery;  Laterality: Bilateral;  Laminectomy and Foraminotomy - left - Lumbar Two-Lumbar Three -  - bilateral - Lumbar Four-Lumbar Five   . MULTIPLE TOOTH EXTRACTIONS    . PATELLA FRACTURE SURGERY Left 1990s   "after the replacement"  . REDUCTION MAMMAPLASTY Bilateral 2004  . TOTAL KNEE ARTHROPLASTY Bilateral left 1992;  right 2001  . 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 VAGINAL HYSTERECTOMY  1972    Family History  Problem Relation Age of Onset  . Hypertension Mother   . Stroke Mother   . Diabetes Mother   . Heart attack Father   . Breast cancer Sister   . Leukemia Brother   . Breast cancer Sister   . Breast cancer Sister   . Heart disease Brother    Social History:  reports that she has never smoked. She has  never used smokeless tobacco. She reports that she does not drink alcohol and does not use drugs.  Allergies:  Allergies  Allergen Reactions  . Contrast Media [Iodinated Diagnostic Agents] Swelling, Rash and Other (See Comments)    Face, tongue swelling Needs 13-hour prep  . Penicillins Swelling and Rash    PATIENT HAS HAD A PCN REACTION WITH IMMEDIATE RASH, FACIAL/TONGUE/THROAT SWELLING, SOB, OR LIGHTHEADEDNESS WITH HYPOTENSION:  #  #  YES  #  #  Has patient had a PCN reaction causing severe rash involving mucus membranes or skin necrosis: NO Has patient had a PCN reaction that required hospitalization NO Has patient had a  PCN reaction occurring within the last 10 years: NO If all of the above answers are "NO", then may proceed with Cephalosporin use.  . Baclofen Itching  . Latex Rash  . Other Dermatitis and Other (See Comments)    Bandages caused redness     Medications Prior to Admission  Medication Sig Dispense Refill  . ALPRAZolam (XANAX) 0.25 MG tablet Take 1 tablet (0.25 mg total) by mouth daily as needed. (Patient taking differently: Take 0.25 mg by mouth daily as needed for anxiety.) 30 tablet 0  . celecoxib (CELEBREX) 200 MG capsule Take 200 mg by mouth daily as needed for mild pain.     . furosemide (LASIX) 20 MG tablet Take 20 mg by mouth daily as needed for fluid.     Marland Kitchen gabapentin (NEURONTIN) 300 MG capsule Take 300 mg by mouth 3 (three) times daily.    Marland Kitchen HYDROcodone-acetaminophen (NORCO/VICODIN) 5-325 MG tablet Take 1 tablet by mouth every 6 (six) hours as needed for moderate pain.    Marland Kitchen loratadine (CLARITIN) 10 MG tablet Take 10 mg by mouth daily. In the morning    . Multiple Vitamins-Minerals (AIRBORNE GUMMIES PO) Take by mouth.    . pantoprazole (PROTONIX) 20 MG tablet Take 1 tablet (20 mg total) by mouth daily. (Patient taking differently: Take 20 mg by mouth at bedtime.) 30 tablet 0  . ramipril (ALTACE) 2.5 MG capsule Take 2.5 mg by mouth daily.  5  . traMADol HCl 100 MG TABS Take 100 mg by mouth 2 (two) times daily as needed (pain).    . Vitamin D, Ergocalciferol, (DRISDOL) 50000 UNITS CAPS capsule Take 50,000 Units by mouth every Thursday.   5  . meclizine (ANTIVERT) 25 MG tablet Take 25 mg by mouth 3 (three) times daily as needed for dizziness.       Results for orders placed or performed during the hospital encounter of 11/28/20 (from the past 48 hour(s))  I-STAT, chem 8     Status: None   Collection Time: 11/28/20  9:46 AM  Result Value Ref Range   Sodium 141 135 - 145 mmol/L   Potassium 3.7 3.5 - 5.1 mmol/L   Chloride 101 98 - 111 mmol/L   BUN 12 8 - 23 mg/dL   Creatinine, Ser  0.80 0.44 - 1.00 mg/dL   Glucose, Bld 87 70 - 99 mg/dL    Comment: Glucose reference range applies only to samples taken after fasting for at least 8 hours.   Calcium, Ion 1.32 1.15 - 1.40 mmol/L   TCO2 30 22 - 32 mmol/L   Hemoglobin 15.0 12.0 - 15.0 g/dL   HCT 44.0 36.0 - 46.0 %   No results found.  Review of Systems  Constitutional: Negative for chills and fever.  HENT: Negative for hearing loss.   Respiratory: Negative for cough.  Cardiovascular: Negative for chest pain and palpitations.  Gastrointestinal: Negative for abdominal pain, nausea and vomiting.  Genitourinary: Negative for dysuria and urgency.  Musculoskeletal: Positive for back pain. Negative for myalgias and neck pain.  Skin: Negative for rash.  Neurological: Negative for dizziness and headaches.  Hematological: Does not bruise/bleed easily.  Psychiatric/Behavioral: Negative for suicidal ideas.    Blood pressure (!) 148/72, pulse 63, temperature 98.7 F (37.1 C), temperature source Oral, resp. rate 18, height 5' 4.5" (1.638 m), weight 102.6 kg, SpO2 100 %. Physical Exam Vitals reviewed.  Constitutional:      Appearance: She is well-developed and well-nourished.  HENT:     Head: Normocephalic and atraumatic.  Eyes:     Extraocular Movements: EOM normal.     Conjunctiva/sclera: Conjunctivae normal.     Pupils: Pupils are equal, round, and reactive to light.  Cardiovascular:     Rate and Rhythm: Normal rate and regular rhythm.  Pulmonary:     Effort: Pulmonary effort is normal.     Breath sounds: Normal breath sounds.  Abdominal:     General: Bowel sounds are normal. There is no distension.     Palpations: Abdomen is soft.     Tenderness: There is no abdominal tenderness.     Comments: Left groin palpable lymph node  Musculoskeletal:        General: Normal range of motion.     Cervical back: Normal range of motion and neck supple.  Skin:    General: Skin is warm and dry.  Neurological:     Mental  Status: She is alert and oriented to person, place, and time.  Psychiatric:        Mood and Affect: Mood and affect normal.        Behavior: Behavior normal.      Assessment/Plan 80 yo female with enlarged lymph node -excision of left inguinal lymph node -outpatient procedure  Mickeal Skinner, MD 11/28/2020, 10:36 AM

## 2020-11-29 ENCOUNTER — Encounter (HOSPITAL_BASED_OUTPATIENT_CLINIC_OR_DEPARTMENT_OTHER): Payer: Self-pay | Admitting: General Surgery

## 2020-11-29 LAB — SURGICAL PATHOLOGY

## 2020-11-29 NOTE — Anesthesia Postprocedure Evaluation (Signed)
Anesthesia Post Note  Patient: Wendy Carrillo  Procedure(s) Performed: LEFT GROIN LYMPH NODE EXCISIONAL BIOPSY (Left Groin)     Patient location during evaluation: PACU Anesthesia Type: General Level of consciousness: awake and alert Pain management: pain level controlled Vital Signs Assessment: post-procedure vital signs reviewed and stable Respiratory status: spontaneous breathing, nonlabored ventilation, respiratory function stable and patient connected to nasal cannula oxygen Cardiovascular status: blood pressure returned to baseline and stable Postop Assessment: no apparent nausea or vomiting Anesthetic complications: no   No complications documented.  Last Vitals:  Vitals:   11/28/20 1200 11/28/20 1225  BP: (!) 135/97 126/78  Pulse: 69 71  Resp: 14 14  Temp: (!) 36.1 C 36.4 C  SpO2: 99% 98%    Last Pain:  Vitals:   11/28/20 1225  TempSrc:   PainSc: 0-No pain                 Barnet Glasgow

## 2020-12-23 ENCOUNTER — Other Ambulatory Visit (HOSPITAL_COMMUNITY): Payer: Self-pay | Admitting: General Surgery

## 2020-12-23 DIAGNOSIS — M7989 Other specified soft tissue disorders: Secondary | ICD-10-CM

## 2020-12-26 ENCOUNTER — Other Ambulatory Visit: Payer: Self-pay

## 2020-12-26 ENCOUNTER — Ambulatory Visit (HOSPITAL_COMMUNITY)
Admission: RE | Admit: 2020-12-26 | Discharge: 2020-12-26 | Disposition: A | Discharge status: Home / Self Care | Payer: Medicare PPO | Source: Ambulatory Visit | Attending: General Surgery | Admitting: General Surgery

## 2020-12-26 DIAGNOSIS — M7989 Other specified soft tissue disorders: Secondary | ICD-10-CM | POA: Diagnosis present

## 2020-12-26 NOTE — Progress Notes (Signed)
Left lower extremity venous study completed.   Attempted to reach CB number provided 2x  however no one answered.   Please see CV Proc for preliminary results.   Vonzell Schlatter, RVT

## 2021-06-19 ENCOUNTER — Other Ambulatory Visit: Payer: Self-pay

## 2021-06-19 ENCOUNTER — Emergency Department (HOSPITAL_BASED_OUTPATIENT_CLINIC_OR_DEPARTMENT_OTHER): Payer: Medicare PPO

## 2021-06-19 ENCOUNTER — Emergency Department (HOSPITAL_BASED_OUTPATIENT_CLINIC_OR_DEPARTMENT_OTHER)
Admission: EM | Admit: 2021-06-19 | Discharge: 2021-06-19 | Disposition: A | Discharge status: Home / Self Care | Payer: Medicare PPO | Attending: Emergency Medicine | Admitting: Emergency Medicine

## 2021-06-19 ENCOUNTER — Encounter (HOSPITAL_BASED_OUTPATIENT_CLINIC_OR_DEPARTMENT_OTHER): Payer: Self-pay

## 2021-06-19 DIAGNOSIS — M545 Low back pain, unspecified: Secondary | ICD-10-CM | POA: Diagnosis present

## 2021-06-19 DIAGNOSIS — Z79899 Other long term (current) drug therapy: Secondary | ICD-10-CM | POA: Diagnosis not present

## 2021-06-19 DIAGNOSIS — Z91040 Latex allergy status: Secondary | ICD-10-CM | POA: Insufficient documentation

## 2021-06-19 DIAGNOSIS — G8929 Other chronic pain: Secondary | ICD-10-CM | POA: Insufficient documentation

## 2021-06-19 DIAGNOSIS — I10 Essential (primary) hypertension: Secondary | ICD-10-CM | POA: Diagnosis not present

## 2021-06-19 DIAGNOSIS — Z96653 Presence of artificial knee joint, bilateral: Secondary | ICD-10-CM | POA: Diagnosis not present

## 2021-06-19 MED ORDER — LIDOCAINE 5 % EX PTCH: 3.0000 | MEDICATED_PATCH | CUTANEOUS | 0 refills | Status: AC

## 2021-06-19 MED ORDER — PREDNISONE 20 MG PO TABS: ORAL_TABLET | ORAL | 0 refills | Status: DC

## 2021-06-19 MED ORDER — CYCLOBENZAPRINE HCL 10 MG PO TABS: 10.0000 mg | ORAL_TABLET | Freq: Once | ORAL | 0 refills | Status: DC | PRN

## 2021-06-19 MED ORDER — OXYCODONE-ACETAMINOPHEN 5-325 MG PO TABS
2.0000 | ORAL_TABLET | Freq: Once | ORAL | Status: AC
  Administered 2021-06-19: 2 via ORAL
  Filled 2021-06-19: qty 2

## 2021-06-19 NOTE — ED Provider Notes (Signed)
[facility] Provider Note   CSN: [id number] Arrival date & time: 06/19/21  1102     History Chief Complaint  Patient presents with   Back Pain    Wendy Carrillo is a 80 y.o. female with a past medical history of arthritis, chronic lower back pain, stenosis of L4-L5 and bilateral lumbar laminectomy 2019 and 2021 who presents today with a complaint of intense lower back pain.  She states that on Thursday she was shopping and may have "tweaked something" but starting Friday she began to have pain that she could not control.  She has been prescribed 4 times a day Vicodin and gabapentin.  These have not controlled her pain as they usually do.  In the past she has done physical therapy and had steroid injections to her back.  No longer doing injections but reports that she is still enrolled in physical therapy.  She denies any numbness, tingling, loss of control of her bladder/bowels.  Says that she is ambulatory at baseline however now is walking with some difficulty due to pain.  No pain radiating down her legs.  States that she is having difficulties sleeping due to this pain.  Last took Vicodin this morning prior to coming here.  Has not had gabapentin today.     Past Medical History:  Diagnosis Date   Arthritis    "all over"   Cervicalgia    Chronic low back pain    Chronic lower back pain    Coronary artery calcification    cardiology--- dr t. Oval Linsey---  nuclear study in epic 05-06-2015 no ischedmia, nuclear ef 51%;  cardiac cath 10-23-2017 mild coronary calcification in all 3V without significant obstructive disease   Enlarged lymph node    left goin   GERD (gastroesophageal reflux disease)    History of duodenal ulcer    History of gastritis    History of hepatitis    1960  per pt had finger stick by needle was  in nursing school , does not remember  what type   Hypertension    followed by pcp   Spinal stenosis, lumbar    Ulnar neuropathy of  right upper extremity    Wears glasses    Wears partial dentures    upper    Patient Active Problem List   Diagnosis Date Noted   Spinal stenosis at L4-L5 level 05/18/2019   Benign essential HTN    History of total bilateral knee replacement (TKR)    Acute blood loss anemia    Post-operative pain    Spinal stenosis of lumbar region 06/09/2018   Spinal stenosis, lumbar    Hypertension    History of duodenal ulcer    Hepatitis    Gastritis    Family history of adverse reaction to anesthesia    Chronic lower back pain    Arthritis    Coronary artery calcification 10/21/2017   Combined forms of age-related cataract of right eye 04/18/2017   Status post total bilateral knee replacement 06/08/2016   Left lumbar radiculopathy 05/09/2016   Bilateral shoulder pain 03/22/2016   DDD (degenerative disc disease), cervical 03/22/2016   Morbid obesity with BMI of 40.0-44.9, adult ([place]) 03/08/2016   Spondylolisthesis, 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/2016   Neck pain 03/08/2016   Lumbar stenosis 03/08/2016   Cervical spinal stenosis 03/08/2016   Back pain 11/16/2015   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/10/2015   Angina pectoris ([place]) 04/20/2015   Morbid obesity ([place]) 04/20/2015  Dyspnea 04/20/2015   Atypical chest pain 04/20/2015   Esophageal reflux 02/23/2015   Essential hypertension 02/23/2015   Chronic back pain 02/23/2015   Chest pain 02/22/2015   Abdominal tenderness, LLQ (left lower quadrant) 06/15/2014   Acute sinusitis 06/15/2014   Allergic rhinitis due to pollen 06/15/2014   Bladder disorder 06/15/2014   Epistaxis 06/15/2014   Female stress incontinence 06/15/2014   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   Insomnia 06/15/2014   Localized primary osteoarthritis of wrist 06/15/2014   Lumbosacral spondylosis 06/15/2014   Osteoarthrosis, localized, primary, hand 06/15/2014   Referred otalgia 06/15/2014   Tingling 06/15/2014   Tinnitus 06/15/2014   Urinary incontinence 06/15/2014   Cystocele  05/04/2014   Retention of urine 05/04/2014   Cystocele, lateral 04/12/2014   Rectocele 03/17/2014   History of blood transfusion 11/05/1990    Past Surgical History:  Procedure Laterality Date   ABDOMINAL HERNIA REPAIR  1980s   ANTERIOR AND POSTERIOR VAGINAL REPAIR  2015   W/  MID-URETHRAL SLING AND SACROSPINOUS FIXATION   BREAST CYST EXCISION Bilateral    BREAST SURGERY Left    "nipple taken off"   CARPAL TUNNEL RELEASE Bilateral right 2010;  left 2012   CATARACT EXTRACTION W/ INTRAOCULAR LENS IMPLANT Left 2014   CATARACT EXTRACTION W/ INTRAOCULAR LENS IMPLANT Bilateral right 2018;  left ?   CHOLECYSTECTOMY OPEN  1980's   AND APPENDECTOMY   COLONOSCOPY W/ BIOPSIES AND POLYPECTOMY  last one 01/ 2017   DILATION AND CURETTAGE OF UTERUS  "several"   HERNIA REPAIR     LEFT HEART CATH AND CORONARY ANGIOGRAPHY N/A 10/23/2017   Procedure: LEFT HEART CATH AND CORONARY ANGIOGRAPHY;  Surgeon: Troy Sine, MD;  Location: [facility];  Service: Cardiovascular;  Laterality: N/A;   LUMBAR LAMINECTOMY/DECOMPRESSION MICRODISCECTOMY Bilateral 06/09/2018   Procedure: Laminectomy and Foraminotomy - Lumbar Two,Lumbar Three - Lumbar One- Two - Lumbar Three-Four- bilateral;  Surgeon: Kary Kos, MD;  Location: [facility];  Service: Neurosurgery;  Laterality: Bilateral;   LUMBAR LAMINECTOMY/DECOMPRESSION MICRODISCECTOMY Bilateral 06/01/2020   Procedure: Laminectomy and Foraminotomy - left - Lumbar Two-Lumbar Three -  - bilateral - Lumbar Four-Lumbar Five;  Surgeon: Kary Kos, MD;  Location: [facility];  Service: Neurosurgery;  Laterality: Bilateral;  Laminectomy and Foraminotomy - left - Lumbar Two-Lumbar Three -  - bilateral - Lumbar Four-Lumbar Five    LYMPH NODE BIOPSY Left 11/28/2020   Procedure: LEFT GROIN LYMPH NODE EXCISIONAL BIOPSY;  Surgeon: Kieth Brightly, Arta Bruce, MD;  Location: [facility];  Service: General;  Laterality: Left;   MULTIPLE TOOTH EXTRACTIONS     PATELLA FRACTURE SURGERY  Left 1990s   "after the replacement"   REDUCTION MAMMAPLASTY Bilateral 2004   TOTAL KNEE ARTHROPLASTY Bilateral left 1992;  right 2001   TRANSURETHRAL RESECTION OF BLADDER TUMOR WITH GYRUS (TURBT-GYRUS)  2014   VAGINAL HYSTERECTOMY  1972     OB History   No obstetric history on file.     Family History  Problem Relation Age of Onset   Hypertension Mother    Stroke Mother    Diabetes Mother    Heart attack Father    Breast cancer Sister    Leukemia Brother    Breast cancer Sister    Breast cancer Sister    Heart disease Brother     Social History   Tobacco Use   Smoking status: Never   Smokeless tobacco: Never  Vaping Use   Vaping Use: Never used  Substance Use Topics  Alcohol use: No   Drug use: Never    Home Medications Prior to Admission medications   Medication Sig Start Date End Date Taking? Authorizing Provider  cyclobenzaprine (FLEXERIL) 10 MG tablet Take 1 tablet (10 mg total) by mouth once as needed for up to 1 dose for muscle spasms. 06/19/21  Yes Dalia Jollie A, PA-C  lidocaine (LIDODERM) 5 % Place 3 patches onto the skin daily for 5 days. Remove & Discard patch within 12 hours or as directed by MD 06/19/21 06/24/21 Yes Areona Homer A, PA-C  predniSONE (DELTASONE) 20 MG tablet 3 Tabs PO Days 1-3, then 2 tabs PO Days 4-6, then 1 tab PO Day 7-9, then Half Tab PO Day 10-12 06/19/21  Yes Carlisle Cater, PA-C  ALPRAZolam Duanne Moron) 0.25 MG tablet Take 1 tablet (0.25 mg total) by mouth daily as needed. Patient taking differently: Take 0.25 mg by mouth daily as needed for anxiety. 02/24/15   Rai, Vernelle Emerald, MD  celecoxib (CELEBREX) 200 MG capsule Take 200 mg by mouth daily as needed for mild pain.  05/16/10   [provider]  furosemide (LASIX) 20 MG tablet Take 20 mg by mouth daily as needed for fluid.     [provider]  gabapentin (NEURONTIN) 300 MG capsule Take 300 mg by mouth 3 (three) times daily.    [provider]   HYDROcodone-acetaminophen (NORCO/VICODIN) 5-325 MG tablet Take 1 tablet by mouth every 6 (six) hours as needed for moderate pain.    [provider]  loratadine (CLARITIN) 10 MG tablet Take 10 mg by mouth daily. In the morning    [provider]  meclizine (ANTIVERT) 25 MG tablet Take 25 mg by mouth 3 (three) times daily as needed for dizziness.  12/02/19   [provider]  Multiple Vitamins-Minerals (AIRBORNE GUMMIES PO) Take by mouth.    [provider]  pantoprazole (PROTONIX) 20 MG tablet Take 1 tablet (20 mg total) by mouth daily. Patient taking differently: Take 20 mg by mouth at bedtime. 08/27/16   Harlin Heys, MD  ramipril (ALTACE) 2.5 MG capsule Take 2.5 mg by mouth daily. 02/17/15   [provider]  traMADol HCl 100 MG TABS Take 100 mg by mouth 2 (two) times daily as needed (pain).    [provider]  Vitamin D, Ergocalciferol, (DRISDOL) 50000 UNITS CAPS capsule Take 50,000 Units by mouth every Thursday.     [provider]    Allergies    Contrast media [iodinated diagnostic agents], Penicillins, Baclofen, Latex, and Other  Review of Systems   Review of Systems  Constitutional:  Negative for chills, fatigue and fever.  Respiratory:  Negative for apnea, shortness of breath and wheezing.   Cardiovascular:  Negative for chest pain, palpitations and leg swelling.  Gastrointestinal:  Negative for abdominal pain, diarrhea, nausea and vomiting.  Genitourinary:  Negative for decreased urine volume, difficulty urinating, enuresis and flank pain.  Musculoskeletal:  Positive for arthralgias and back pain. Negative for joint swelling, neck pain and neck stiffness.  Neurological:  Negative for dizziness, syncope, weakness, light-headedness, numbness and headaches.  All other systems reviewed and are negative.  Physical Exam Updated Vital Signs BP 123/80   Pulse (!) 56   Temp 98.3 F (36.8 C) (Oral)   Resp 19   Ht '5\' 4"'$   (1.626 m)   Wt 99.8 kg   SpO2 98%   BMI 37.76 kg/m   Physical Exam Vitals and nursing note reviewed.  Constitutional:  Appearance: Normal appearance.  HENT:     Head: Normocephalic and atraumatic.  Eyes:     General: No scleral icterus.    Conjunctiva/sclera: Conjunctivae normal.  Cardiovascular:     Rate and Rhythm: Normal rate and regular rhythm.  Pulmonary:     Effort: Pulmonary effort is normal. No respiratory distress.     Breath sounds: Normal breath sounds.  Abdominal:     General: Abdomen is flat.     Tenderness: There is no abdominal tenderness. There is no guarding.  Musculoskeletal:        General: Tenderness (To midline at L4-L5.  Moderate paraspinal muscle tenderness.) present. No swelling, deformity or signs of injury.     Cervical back: Normal range of motion.     Comments: Straight leg raise negative bilaterally  Skin:    General: Skin is warm and dry.     Findings: No lesion or rash.  Neurological:     General: No focal deficit present.     Mental Status: She is alert.     Cranial Nerves: No cranial nerve deficit (Cranial nerves II through XII assessed and no deficit.).     Sensory: No sensory deficit.     Motor: No weakness.     Gait: Gait normal.  Psychiatric:        Mood and Affect: Mood normal.    ED Results / Procedures / Treatments   Labs (all labs ordered are listed, but only abnormal results are displayed) Labs Reviewed - No data to display  EKG None  Radiology DG Lumbar Spine Complete  Result Date: 06/19/2021 CLINICAL DATA:  Back pain EXAM: LUMBAR SPINE - COMPLETE 4+ VIEW COMPARISON:  04/28/2020 FINDINGS: Scoliosis. Grade 1 anterolisthesis L4 on L5. Multilevel degenerative change with disc space narrowing at L1-L2, L2-L3, L3-L4 and L5-S1. Postsurgical changes at L3 and L4. Facet degenerative changes of the lower lumbar spine. IMPRESSION: Scoliosis and multilevel degenerative change with grade 1 anterolisthesis L4 on L5, similar  compared to prior. Electronically Signed   By: Donavan Foil M.D.   On: 06/19/2021 15:34    Procedures Procedures   Medications Ordered in ED Medications  oxyCODONE-acetaminophen (PERCOCET/ROXICET) 5-325 MG per tablet 2 tablet (2 tablets Oral Given 06/19/21 1442)    ED Course  I have reviewed the triage vital signs and the nursing notes.  Pertinent labs & imaging results that were available during my care of the patient were reviewed by me and considered in my medical decision making (see chart for details).  Patient evaluated by me and PA Geiple and appeared in mild distress.  Says that pain is above her baseline.  I treated her with Percocet which she reported relieved some pain.   MDM Rules/Calculators/A&P                         Patient is an 80 year old patient with a extensive orthopedic medical and surgical history who presented with a complaint of acute on chronic lower back pain.  He denies any falls or trauma to her back that has had no relief with at home pain treatment.  No red flag symptoms.  I suspect this is an acute aggravation of her chronic back pain.   I obtained imaging of her lumbar spine which showed no changes from her prior x-rays.  Continues with grade 1 anterolisthesis of L4 and L5.  No compression fractures.  Patient was notified of these results and agreeable to discharge with lidocaine  patches, muscle relaxants and a prednisone taper for her acute symptoms.  She and her husband were educated on the importance of having 12 hours without lidocaine patches.  She agrees to continue the entire prednisone taper.  We discussed the importance of not driving on her muscle relaxants as they may make her drowsy.  She reports remembering this from past courses and is agreeable.  Patient stable for discharge and will follow up with her orthopedic MD and primary care for better treatment of her chronic back pain.  She was advised to continue her prescribed medications and  physical therapy as it may help her chronic pain.   Final Clinical Impression(s) / ED Diagnoses Final diagnoses:  Chronic bilateral low back pain without sciatica    Rx / DC Orders ED Discharge Orders          Ordered    predniSONE (DELTASONE) 20 MG tablet        06/19/21 1552    lidocaine (LIDODERM) 5 %  Every 24 hours        06/19/21 1554    cyclobenzaprine (FLEXERIL) 10 MG tablet  Once PRN        06/19/21 1554          Results and diagnoses were explained to the patient. Return precautions discussed in full. Patient had no additional questions and expressed complete understanding.    Rhae Hammock, PA-C 06/19/21 1811    Fredia Sorrow, MD 06/23/21 2258

## 2021-06-19 NOTE — ED Provider Notes (Signed)
2:56 PM Pt seen in conjunction with [place] PA-C. Pt with acute on chronic back pain, radiation into left leg, not relieved with home meds and rest over the weekend. No red flags. Will obtain plain films due to age. Symptoms started gradually, no energetic injuries or falls. Will attempted to better control pain. May benefit from steroid trial.   BP 117/80 (BP Location: Right Arm)   Pulse 91   Temp 98.3 F (36.8 C) (Oral)   Resp 18   Ht '5\' 4"'$  (1.626 m)   Wt 99.8 kg   SpO2 97%   BMI 37.76 kg/m     Carlisle Cater, PA-C 06/19/21 1458    Sherwood Gambler, MD 06/21/21 [phone]

## 2021-06-19 NOTE — Discharge Instructions (Addendum)
Do not drive on your cyclobenzaprine muscle relaxant.  Take 1/day as needed for pain.  Use up to 3 lidocaine patches at a time.  It is important that you leave the patches off for 12 hours each day.  You may use the patches for 12 hours during the night or during the day, it is up to you.  Prednisone is a steroid.  Take it as directed on the bottle.  You are on a taper, make sure you finish the medication.  Your x-ray showed no changes from your prior scans.  Follow-up with your PCP or orthopedic doctor.  Continue your physical therapy.

## 2021-06-19 NOTE — ED Triage Notes (Signed)
Pt c/o mid/lower back pain x 3 days-reports hx of back pain/surgeries-no relief with at home pain meds-NAD-to triage in w/c

## 2021-08-02 ENCOUNTER — Ambulatory Visit: Payer: Medicare PPO | Admitting: Orthopedic Surgery

## 2021-08-02 ENCOUNTER — Ambulatory Visit: Payer: Self-pay

## 2021-08-02 ENCOUNTER — Other Ambulatory Visit: Payer: Self-pay

## 2021-08-02 DIAGNOSIS — M25561 Pain in right knee: Secondary | ICD-10-CM | POA: Diagnosis not present

## 2021-08-02 DIAGNOSIS — M79604 Pain in right leg: Secondary | ICD-10-CM

## 2021-08-02 DIAGNOSIS — M79605 Pain in left leg: Secondary | ICD-10-CM | POA: Diagnosis not present

## 2021-08-02 DIAGNOSIS — M25562 Pain in left knee: Secondary | ICD-10-CM

## 2021-08-02 DIAGNOSIS — M79661 Pain in right lower leg: Secondary | ICD-10-CM | POA: Diagnosis not present

## 2021-08-03 LAB — CBC WITH DIFFERENTIAL/PLATELET
Absolute Monocytes: 493 cells/uL (ref 200–950)
Basophils Absolute: 31 cells/uL (ref 0–200)
Basophils Relative: 0.7 %
Eosinophils Absolute: 141 cells/uL (ref 15–500)
Eosinophils Relative: 3.2 %
HCT: 43.4 % (ref 35.0–45.0)
Hemoglobin: 13.9 g/dL (ref 11.7–15.5)
Lymphs Abs: 1703 cells/uL (ref 850–3900)
MCH: 29.3 pg (ref 27.0–33.0)
MCHC: 32 g/dL (ref 32.0–36.0)
MCV: 91.4 fL (ref 80.0–100.0)
MPV: 10.6 fL (ref 7.5–12.5)
Monocytes Relative: 11.2 %
Neutro Abs: 2033 cells/uL (ref 1500–7800)
Neutrophils Relative %: 46.2 %
Platelets: 246 10*3/uL (ref 140–400)
RBC: 4.75 10*6/uL (ref 3.80–5.10)
RDW: 12.5 % (ref 11.0–15.0)
Total Lymphocyte: 38.7 %
WBC: 4.4 10*3/uL (ref 3.8–10.8)

## 2021-08-03 LAB — C-REACTIVE PROTEIN: CRP: 4.5 mg/L (ref <8.0)

## 2021-08-03 LAB — SEDIMENTATION RATE: Sed Rate: 2 mm/h (ref 0–30)

## 2021-08-03 NOTE — Progress Notes (Signed)
Labs neg for infxn pls call thx

## 2021-08-03 NOTE — Progress Notes (Signed)
IC advised.  

## 2021-08-07 ENCOUNTER — Encounter: Payer: Self-pay | Admitting: Orthopedic Surgery

## 2021-08-07 NOTE — Progress Notes (Signed)
Office Visit Note   Patient: Wendy Carrillo           Date of Birth: Apr 17, 1941           MRN: [id number] Visit Date: 08/02/2021 Requested by: Jilda Panda, MD [id number] [place],  [place] [address] PCP: Jilda Panda, MD  Subjective: Chief Complaint  Patient presents with   Other    Bilateral leg pain    HPI: Wendy Carrillo is an 80 year old patient with bilateral knee pain right worse than left.  Reports pain in the anterolateral tibial region.  Reports constant pain and some swelling.  Also describes right leg weakness.  She ambulates with a cane.  The pain does wake her from sleep at night.  Reports low back pain as well as groin pain on the right-hand side.  Also has pain of a radicular nature in both legs.  Has history of remote right total knee replacement.  Was going to physical therapy but had to stop.  Had low back surgery with Dr. Saintclair Halsted 3 years ago.  Currently having cold-like symptoms.  Most of her symptoms though at times localized to the knee.  She does have some right knee pain at rest.  She uses a knee brace.  Had MRI scan in 2020 which showed L1 to moderate stenosis and L2-3 and L3-4 left greater than right foraminal impingement.  Remote laminectomy performed.  Notably she did not have her patella resurfaced on the right-hand side.              ROS: All systems reviewed are negative as they relate to the chief complaint within the history of present illness.  Patient denies  fevers or chills.   Assessment & Plan: Visit Diagnoses:  1. Bilateral leg pain   2. Pain in right lower leg     Plan: Impression is low back pain and right leg pain.  Radiographs of the knees show the prosthesis to be not obviously loose on the right-hand side.  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  Plan at this time is sed rate C-reactive protein CBC DIF to rule out infection in the knees.  At the time of this dictation those laboratory values  were negative.  The bone scan to evaluate for loosening of that right knee replacement.  Right hip injection also requested along with L spine ESI.  She wants to avoid surgical intervention.  Follow-up after the studies  Follow-Up Instructions: No follow-ups on file.   Orders:  Orders Placed This Encounter  Procedures   XR Knee 1-2 Views Right   XR Knee 1-2 Views Left   XR Lumbar Spine 2-3 Views   XR Pelvis 1-2 Views   NM Bone Scan 3 Phase Lower Extremity   Sed Rate (ESR)   C-reactive protein   CBC with Differential   Ambulatory referral to Physical Medicine Rehab   No orders of the defined types were placed in this encounter.     Procedures: No procedures performed   Clinical Data: No additional findings.  Objective: Vital Signs: There were no vitals taken for this visit.  Physical Exam:   Constitutional: Patient appears well-developed HEENT:  Head: Normocephalic Eyes:EOM are normal Neck: Normal range of motion Cardiovascular: Normal rate Pulmonary/chest: Effort normal Neurologic: Patient is alert Skin: Skin is warm Psychiatric: Patient has normal mood and affect   Ortho Exam: Ortho exam demonstrates full active and passive range of motion  of the ankles.  Knees have no warmth or effusion bilaterally.  Extensor mechanism is intact.  Pretty good stability to varus valgus stress at 0 30 and 90 degrees.  Does have groin pain on the right more than the left with internal rotation.  No nerve root tension signs or paresthesias L1 S1 bilaterally.  Specialty Comments:  No specialty comments available.  Imaging: No results found.   PMFS History: Patient Active Problem List   Diagnosis Date Noted   Spinal stenosis at L4-L5 level 05/18/2019   Benign essential HTN    History of total bilateral knee replacement (TKR)    Acute blood loss anemia    Post-operative pain    Spinal stenosis of lumbar region 06/09/2018   Spinal stenosis, lumbar    Hypertension    History  of duodenal ulcer    Hepatitis    Gastritis    Family history of adverse reaction to anesthesia    Chronic lower back pain    Arthritis    Coronary artery calcification 10/21/2017   Combined forms of age-related cataract of right eye 04/18/2017   Status post total bilateral knee replacement 06/08/2016   Left lumbar radiculopathy 05/09/2016   Bilateral shoulder pain 03/22/2016   DDD (degenerative disc disease), cervical 03/22/2016   Morbid obesity with BMI of 40.0-44.9, adult ([place]) 03/08/2016   Spondylolisthesis, 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/2016   Neck pain 03/08/2016   Lumbar stenosis 03/08/2016   Cervical spinal stenosis 03/08/2016   Back pain 11/16/2015   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/10/2015   Angina pectoris ([place]) 04/20/2015   Morbid obesity ([place]) 04/20/2015   Dyspnea 04/20/2015   Atypical chest pain 04/20/2015   Esophageal reflux 02/23/2015   Essential hypertension 02/23/2015   Chronic back pain 02/23/2015   Chest pain 02/22/2015   Abdominal tenderness, LLQ (left lower quadrant) 06/15/2014   Acute sinusitis 06/15/2014   Allergic rhinitis due to pollen 06/15/2014   Bladder disorder 06/15/2014   Epistaxis 06/15/2014   Female stress incontinence 06/15/2014   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   Insomnia 06/15/2014   Localized primary osteoarthritis of wrist 06/15/2014   Lumbosacral spondylosis 06/15/2014   Osteoarthrosis, localized, primary, hand 06/15/2014   Referred otalgia 06/15/2014   Tingling 06/15/2014   Tinnitus 06/15/2014   Urinary incontinence 06/15/2014   Cystocele 05/04/2014   Retention of urine 05/04/2014   Cystocele, lateral 04/12/2014   Rectocele 03/17/2014   History of blood transfusion 11/05/1990   Past Medical History:  Diagnosis Date   Arthritis    "all over"   Cervicalgia    Chronic low back pain    Chronic lower back pain    Coronary artery calcification    cardiology--- dr t. 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 , does not remember  what type   Hypertension    followed by pcp   Spinal stenosis, lumbar    Ulnar neuropathy of right upper extremity    Wears glasses    Wears partial dentures    upper    Family History  Problem Relation Age of Onset   Hypertension Mother  Stroke Mother    Diabetes Mother    Heart attack Father    Breast cancer Sister    Leukemia Brother    Breast cancer Sister    Breast cancer Sister    Heart disease Brother     Past Surgical History:  Procedure Laterality Date   ABDOMINAL HERNIA REPAIR  1980s   ANTERIOR AND POSTERIOR VAGINAL REPAIR  2015   W/  MID-URETHRAL SLING AND SACROSPINOUS FIXATION   BREAST CYST EXCISION Bilateral    BREAST SURGERY Left    "nipple taken off"   CARPAL TUNNEL RELEASE Bilateral right 2010;  left 2012   CATARACT EXTRACTION W/ INTRAOCULAR LENS IMPLANT Left 2014   CATARACT EXTRACTION W/ INTRAOCULAR LENS IMPLANT Bilateral right 2018;  left ?   CHOLECYSTECTOMY OPEN  1980's   AND APPENDECTOMY   COLONOSCOPY W/ BIOPSIES AND POLYPECTOMY  last one 01/ 2017   DILATION AND CURETTAGE OF UTERUS  "several"   HERNIA REPAIR     LEFT HEART CATH AND CORONARY ANGIOGRAPHY N/A 10/23/2017   Procedure: LEFT HEART CATH AND CORONARY ANGIOGRAPHY;  Surgeon: Troy Sine, MD;  Location: [facility];  Service: Cardiovascular;  Laterality: N/A;   LUMBAR LAMINECTOMY/DECOMPRESSION MICRODISCECTOMY Bilateral 06/09/2018   Procedure: Laminectomy and Foraminotomy - Lumbar Two,Lumbar Three - Lumbar One- Two - Lumbar Three-Four- bilateral;  Surgeon: Kary Kos, MD;  Location: [facility];  Service: Neurosurgery;  Laterality: Bilateral;   LUMBAR  LAMINECTOMY/DECOMPRESSION MICRODISCECTOMY Bilateral 06/01/2020   Procedure: Laminectomy and Foraminotomy - left - Lumbar Two-Lumbar Three -  - bilateral - Lumbar Four-Lumbar Five;  Surgeon: Kary Kos, MD;  Location: [facility];  Service: Neurosurgery;  Laterality: Bilateral;  Laminectomy and Foraminotomy - left - Lumbar Two-Lumbar Three -  - bilateral - Lumbar Four-Lumbar Five    LYMPH NODE BIOPSY Left 11/28/2020   Procedure: LEFT GROIN LYMPH NODE EXCISIONAL BIOPSY;  Surgeon: Kieth Brightly, Arta Bruce, MD;  Location: [facility];  Service: General;  Laterality: Left;   MULTIPLE TOOTH EXTRACTIONS     PATELLA FRACTURE SURGERY Left 1990s   "after the replacement"   REDUCTION MAMMAPLASTY Bilateral 2004   TOTAL KNEE ARTHROPLASTY Bilateral left 1992;  right 2001   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

## 2021-08-09 ENCOUNTER — Encounter (HOSPITAL_COMMUNITY)
Admission: RE | Admit: 2021-08-09 | Discharge: 2021-08-09 | Disposition: A | Discharge status: Home / Self Care | Payer: Medicare PPO | Source: Ambulatory Visit | Attending: Orthopedic Surgery | Admitting: Orthopedic Surgery

## 2021-08-09 ENCOUNTER — Other Ambulatory Visit: Payer: Self-pay

## 2021-08-09 DIAGNOSIS — M79661 Pain in right lower leg: Secondary | ICD-10-CM | POA: Insufficient documentation

## 2021-08-09 MED ORDER — TECHNETIUM TC 99M MEDRONATE IV KIT
20.1000 | PACK | Freq: Once | INTRAVENOUS | Status: AC
  Administered 2021-08-09: 20.1 via INTRAVENOUS

## 2021-08-15 ENCOUNTER — Encounter: Payer: Self-pay | Admitting: Physical Medicine and Rehabilitation

## 2021-08-15 ENCOUNTER — Ambulatory Visit: Payer: Medicare PPO | Admitting: Physical Medicine and Rehabilitation

## 2021-08-15 ENCOUNTER — Other Ambulatory Visit: Payer: Self-pay

## 2021-08-15 ENCOUNTER — Ambulatory Visit: Payer: Self-pay

## 2021-08-15 DIAGNOSIS — M25552 Pain in left hip: Secondary | ICD-10-CM | POA: Diagnosis not present

## 2021-08-15 DIAGNOSIS — M5416 Radiculopathy, lumbar region: Secondary | ICD-10-CM | POA: Diagnosis not present

## 2021-08-15 DIAGNOSIS — M48062 Spinal stenosis, lumbar region with neurogenic claudication: Secondary | ICD-10-CM | POA: Diagnosis not present

## 2021-08-15 DIAGNOSIS — M25551 Pain in right hip: Secondary | ICD-10-CM

## 2021-08-15 NOTE — Progress Notes (Signed)
Pt state right hip pain that travels to both legs. Pt state walking and laying down makes the pain worse. Pt state she takes pain meds to help ease her pain.  Numeric Pain Rating Scale and Functional Assessment Average Pain 5   In the last MONTH (on 0-10 scale) has pain interfered with the following?  1. General activity like being  able to carry out your everyday physical activities such as walking, climbing stairs, carrying groceries, or moving a chair?  Rating(9)    -BT, +Dye Allergies.

## 2021-08-18 ENCOUNTER — Encounter: Payer: Self-pay | Admitting: Orthopedic Surgery

## 2021-08-18 ENCOUNTER — Ambulatory Visit: Payer: Medicare PPO | Admitting: Orthopedic Surgery

## 2021-08-18 ENCOUNTER — Other Ambulatory Visit: Payer: Self-pay

## 2021-08-18 DIAGNOSIS — M79604 Pain in right leg: Secondary | ICD-10-CM | POA: Diagnosis not present

## 2021-08-18 DIAGNOSIS — M79605 Pain in left leg: Secondary | ICD-10-CM | POA: Diagnosis not present

## 2021-08-18 MED ORDER — METHOCARBAMOL 500 MG PO TABS: 500.0000 mg | ORAL_TABLET | Freq: Three times a day (TID) | ORAL | 0 refills | Status: DC | PRN

## 2021-08-18 NOTE — Progress Notes (Signed)
Office Visit Note   Patient: Wendy Carrillo           Date of Birth: 08-Jul-1941           MRN: [id number] Visit Date: 08/18/2021 Requested by: Wendy Panda, MD [address] [place],  [place] [address] PCP: Wendy Panda, MD  Subjective: Chief Complaint  Patient presents with   Right Knee - Follow-up    Bone scan review    HPI: Wendy Carrillo is a 80 year old patient with left and right hip and knee pain.  Since she was last seen she has had an injection by Dr. Ernestina Carrillo on 08/15/2021.  The left thigh is feeling better after that injection.  She had left knee replacement about 25 years ago.  Right knee replacement done also around that same time slightly later.  She is taking hydrocodone daily.  Reports diffuse pain radiating into the tibia.  She also has known history of right hip arthritis with injection pending.              ROS: All systems reviewed are negative as they relate to the chief complaint within the history of present illness.  Patient denies  fevers or chills.   Assessment & Plan: Visit Diagnoses:  1. Bilateral leg pain     Plan: Impression is bone scan shows no definite loosening on the left.  Slight uptake on the right tibial plateau but radiographs look good.  No indication for operative intervention at this time.  I think her hips may be contributing to some of his knee pain.  Left hip feels good.  Refer to Dr. Ernestina Carrillo for right hip injection as well.  No operative indication at this time.  Patient wants to avoid surgery as much as possible.  Follow-up as needed.  Follow-Up Instructions: Return if symptoms worsen or fail to improve.   Orders:  Orders Placed This Encounter  Procedures   Ambulatory referral to Physical Medicine Rehab   Meds ordered this encounter  Medications   methocarbamol (ROBAXIN) 500 MG tablet    Sig: Take 1 tablet (500 mg total) by mouth every 8 (eight) hours as needed for muscle spasms.    Dispense:  40 tablet    Refill:  0       Procedures: No procedures performed   Clinical Data: No additional findings.  Objective: Vital Signs: There were no vitals taken for this visit.  Physical Exam:   Constitutional: Patient appears well-developed HEENT:  Head: Normocephalic Eyes:EOM are normal Neck: Normal range of motion Cardiovascular: Normal rate Pulmonary/chest: Effort normal Neurologic: Patient is alert Skin: Skin is warm Psychiatric: Patient has normal mood and affect   Ortho Exam: Ortho exam demonstrates full active and passive range of motion of the ankles with mild groin pain in each hip.  Right knee has some tenderness around the tibial plateau no effusion in either knee.  Extensor mechanism is intact bilaterally.  Specialty Comments:  No specialty comments available.  Imaging: No results found.   PMFS History: Patient Active Problem List   Diagnosis Date Noted   Spinal stenosis at L4-L5 level 05/18/2019   Benign essential HTN    History of total bilateral knee replacement (TKR)    Acute blood loss anemia    Post-operative pain    Spinal stenosis of lumbar region 06/09/2018   Spinal stenosis, lumbar    Hypertension    History of duodenal ulcer    Hepatitis    Gastritis    Family history of  adverse reaction to anesthesia    Chronic lower back pain    Arthritis    Coronary artery calcification 10/21/2017   Combined forms of age-related cataract of right eye 04/18/2017   Status post total bilateral knee replacement 06/08/2016   Left lumbar radiculopathy 05/09/2016   Bilateral shoulder pain 03/22/2016   DDD (degenerative disc disease), cervical 03/22/2016   Morbid obesity with BMI of 40.0-44.9, adult ([place]) 03/08/2016   Spondylolisthesis, 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/2016   Neck pain 03/08/2016   Lumbar stenosis 03/08/2016   Cervical spinal stenosis 03/08/2016   Back pain 11/16/2015   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/10/2015   Angina pectoris ([place]) 04/20/2015   Morbid obesity ([place])  04/20/2015   Dyspnea 04/20/2015   Atypical chest pain 04/20/2015   Esophageal reflux 02/23/2015   Essential hypertension 02/23/2015   Chronic back pain 02/23/2015   Chest pain 02/22/2015   Abdominal tenderness, LLQ (left lower quadrant) 06/15/2014   Acute sinusitis 06/15/2014   Allergic rhinitis due to pollen 06/15/2014   Bladder disorder 06/15/2014   Epistaxis 06/15/2014   Female stress incontinence 06/15/2014   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   Insomnia 06/15/2014   Localized primary osteoarthritis of wrist 06/15/2014   Lumbosacral spondylosis 06/15/2014   Osteoarthrosis, localized, primary, hand 06/15/2014   Referred otalgia 06/15/2014   Tingling 06/15/2014   Tinnitus 06/15/2014   Urinary incontinence 06/15/2014   Cystocele 05/04/2014   Retention of urine 05/04/2014   Cystocele, lateral 04/12/2014   Rectocele 03/17/2014   History of blood transfusion 11/05/1990   Past Medical History:  Diagnosis Date   Arthritis    "all over"   Cervicalgia    Chronic low back pain    Chronic lower back pain    Coronary artery calcification    cardiology--- dr Wendy Carrillo---  nuclear study in epic 05-06-2015 no ischedmia, nuclear ef 51%;  cardiac cath 10-23-2017 mild coronary calcification in all 3V without significant obstructive disease   Enlarged lymph node    left goin   GERD (gastroesophageal reflux disease)    History of duodenal ulcer    History of gastritis    History of hepatitis    1960  per pt had finger stick by needle was  in nursing school , does not remember  what type   Hypertension    followed by pcp   Spinal stenosis, lumbar    Ulnar neuropathy of right upper extremity    Wears glasses    Wears partial dentures    upper    Family History  Problem Relation Age of Onset   Hypertension Mother    Stroke Mother    Diabetes Mother    Heart attack Father    Breast cancer Sister    Leukemia Brother    Breast cancer Sister    Breast cancer Sister    Heart disease  Brother     Past Surgical History:  Procedure Laterality Date   ABDOMINAL HERNIA REPAIR  1980s   ANTERIOR AND POSTERIOR VAGINAL REPAIR  2015   W/  MID-URETHRAL SLING AND SACROSPINOUS FIXATION   BREAST CYST EXCISION Bilateral    BREAST SURGERY Left    "nipple taken off"   CARPAL TUNNEL RELEASE Bilateral right 2010;  left 2012   CATARACT EXTRACTION W/ INTRAOCULAR LENS IMPLANT Left 2014   CATARACT EXTRACTION W/ INTRAOCULAR LENS IMPLANT Bilateral right 2018;  left ?   CHOLECYSTECTOMY OPEN  1980's   AND APPENDECTOMY   COLONOSCOPY W/ BIOPSIES AND POLYPECTOMY  last one 01/ 2017  DILATION AND CURETTAGE OF UTERUS  "several"   HERNIA REPAIR     LEFT HEART CATH AND CORONARY ANGIOGRAPHY N/A 10/23/2017   Procedure: LEFT HEART CATH AND CORONARY ANGIOGRAPHY;  Surgeon: Troy Sine, MD;  Location: [facility];  Service: Cardiovascular;  Laterality: N/A;   LUMBAR LAMINECTOMY/DECOMPRESSION MICRODISCECTOMY Bilateral 06/09/2018   Procedure: Laminectomy and Foraminotomy - Lumbar Two,Lumbar Three - Lumbar One- Two - Lumbar Three-Four- bilateral;  Surgeon: Kary Kos, MD;  Location: [facility];  Service: Neurosurgery;  Laterality: Bilateral;   LUMBAR LAMINECTOMY/DECOMPRESSION MICRODISCECTOMY Bilateral 06/01/2020   Procedure: Laminectomy and Foraminotomy - left - Lumbar Two-Lumbar Three -  - bilateral - Lumbar Four-Lumbar Five;  Surgeon: Kary Kos, MD;  Location: [facility];  Service: Neurosurgery;  Laterality: Bilateral;  Laminectomy and Foraminotomy - left - Lumbar Two-Lumbar Three -  - bilateral - Lumbar Four-Lumbar Five    LYMPH NODE BIOPSY Left 11/28/2020   Procedure: LEFT GROIN LYMPH NODE EXCISIONAL BIOPSY;  Surgeon: Kieth Brightly, Arta Bruce, MD;  Location: [facility];  Service: General;  Laterality: Left;   MULTIPLE TOOTH EXTRACTIONS     PATELLA FRACTURE SURGERY Left 1990s   "after the replacement"   REDUCTION MAMMAPLASTY Bilateral 2004   TOTAL KNEE ARTHROPLASTY Bilateral left 1992;  right  2001   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

## 2021-08-29 ENCOUNTER — Other Ambulatory Visit: Payer: Self-pay

## 2021-08-29 ENCOUNTER — Ambulatory Visit: Payer: Medicare PPO | Admitting: Physical Medicine and Rehabilitation

## 2021-08-29 ENCOUNTER — Ambulatory Visit: Payer: Self-pay

## 2021-08-29 ENCOUNTER — Encounter: Payer: Self-pay | Admitting: Physical Medicine and Rehabilitation

## 2021-08-29 DIAGNOSIS — M25551 Pain in right hip: Secondary | ICD-10-CM

## 2021-08-29 DIAGNOSIS — M961 Postlaminectomy syndrome, not elsewhere classified: Secondary | ICD-10-CM | POA: Diagnosis not present

## 2021-08-29 DIAGNOSIS — M7062 Trochanteric bursitis, left hip: Secondary | ICD-10-CM

## 2021-08-29 DIAGNOSIS — M25561 Pain in right knee: Secondary | ICD-10-CM

## 2021-08-29 DIAGNOSIS — M25562 Pain in left knee: Secondary | ICD-10-CM

## 2021-08-29 DIAGNOSIS — M5416 Radiculopathy, lumbar region: Secondary | ICD-10-CM

## 2021-08-29 DIAGNOSIS — G8929 Other chronic pain: Secondary | ICD-10-CM

## 2021-08-29 NOTE — Progress Notes (Signed)
Did her left hip last time. Pt state her left hip pain is worse than her right. Pt state walking makes the pain worse. Pt state she takes pain meds to help ease her pain.  Numeric Pain Rating Scale and Functional Assessment Average Pain 8   In the last MONTH (on 0-10 scale) has pain interfered with the following?  1. General activity like being  able to carry out your everyday physical activities such as walking, climbing stairs, carrying groceries, or moving a chair?  Rating(10)    -BT, -Dye Allergies.

## 2021-08-31 ENCOUNTER — Encounter: Payer: Self-pay | Admitting: Physical Medicine and Rehabilitation

## 2021-08-31 MED ORDER — BUPIVACAINE HCL 0.25 % IJ SOLN
4.0000 mL | INTRAMUSCULAR | Status: AC | PRN
  Administered 2021-08-15: 4 mL via INTRA_ARTICULAR

## 2021-08-31 MED ORDER — TRIAMCINOLONE ACETONIDE 40 MG/ML IJ SUSP
60.0000 mg | INTRAMUSCULAR | Status: AC | PRN
Start: 2021-08-15 — End: 2021-08-15
  Administered 2021-08-15: 60 mg via INTRA_ARTICULAR

## 2021-08-31 NOTE — Progress Notes (Signed)
Wendy Carrillo - 80 y.o. female MRN [id number]  Date of birth: 1940/11/23  Office Visit Note: Visit Date: 08/15/2021 PCP: Jilda Panda, MD Referred by: Jilda Panda, MD  Subjective: Chief Complaint  Patient presents with   Right Hip - Pain   Left Leg - Pain   Right Leg - Pain   HPI:  Wendy Carrillo is a 80 y.o. female who comes in today at the request of Dr. Anderson Malta for planned Right anesthetic hip arthrogram with fluoroscopic guidance.  The patient has failed conservative care including home exercise, medications, time and activity modification.  This injection will be diagnostic and hopefully therapeutic.  Please see requesting physician notes for further details and justification.  She also reports some symptoms on the left with some radiating symptoms into the thigh.  MRI reviewed with her showing lumbar spine multilevel spondylitic arthritic changes with stenosis at L2-3.  Depending on relief with the hip injection would look at diagnostic and hopefully therapeutic L2 transforaminal injection.  Discussed this at length with her.  Case complicated by contrast allergy.  X-ray of the pelvis shows moderate right hip arthritis compared to mild on the left.  Her biggest complaint is her left hip and groin and lateral thigh.  We elected today to complete a left intra-articular hip injection.  She is going to follow-up with Dr. Marlou Sa but I would suggest that if this does not help much to look at the transforaminal injection at L2.  She has pretty good range of motion of the left hip without as much pain.  Her symptoms are somewhat consistent both with hip pathology and an L2 radicular pain.  ROS Otherwise per HPI.  Assessment & Plan: Visit Diagnoses:    ICD-10-CM   1. Pain in right hip  M25.551 XR C-ARM NO REPORT    2. Spinal stenosis of lumbar region with neurogenic claudication  M48.062     3. Lumbar radiculopathy  M54.16       Plan: Findings:  See history of present  illness.   Meds & Orders: No orders of the defined types were placed in this encounter.   Orders Placed This Encounter  Procedures   Large Joint Inj   XR C-ARM NO REPORT    Follow-up: Return if symptoms worsen or fail to improve.   Procedures: Large Joint Inj: L hip joint on 08/15/2021 3:00 PM Indications: diagnostic evaluation and pain Details: 22 G 3.5 in needle, fluoroscopy-guided anterior approach  Arthrogram: No  Medications: 4 mL bupivacaine 0.25 %; 60 mg triamcinolone acetonide 40 MG/ML Outcome: tolerated well, no immediate complications  There was excellent flow of contrast producing a partial arthrogram of the hip. The patient did have relief of symptoms during the anesthetic phase of the injection. Procedure, treatment alternatives, risks and benefits explained, specific risks discussed. Consent was given by the patient. Immediately prior to procedure a time out was called to verify the correct patient, procedure, equipment, support staff and site/side marked as required. Patient was prepped and draped in the usual sterile fashion.         Clinical History: EXAM: MRI LUMBAR SPINE WITHOUT CONTRAST   TECHNIQUE: Multiplanar, multisequence MR imaging of the lumbar spine was performed. No intravenous contrast was administered.   COMPARISON:  05/29/2017   FINDINGS: The study is mildly motion degraded despite repeated imaging attempts.   Segmentation: Standard.   Alignment: Mild S-shaped lumbar scoliosis. Unchanged grade 1 retrolisthesis of T12 on L1, L1 on L2,  and L5 on S1 and grade 1 anterolisthesis of L4 on L5.   Vertebrae: No fracture or suspicious osseous lesion. Degenerative endplate changes at B63-S9 including minimal edema.   Conus medullaris and cauda equina: Conus extends to the L1-2 level. Conus and cauda equina appear normal.   Paraspinal and other soft tissues: Partially visualized large right upper pole renal cyst measuring 6 cm, similar to  prior. 9 mm right lower pole renal cyst.   Disc levels:   Disc desiccation throughout the lumbar and included lower thoracic spine. Severe disc space narrowing at T12-L1 and L5-S1 with milder narrowing throughout the remainder of the lumbar and lower thoracic spine with exception of L4-5.   T12-L1: Circumferential disc osteophyte complex and retrolisthesis result in moderate to severe right neural foraminal stenosis without spinal stenosis, unchanged.   L1-2: Circumferential disc bulging and moderate facet and ligamentum flavum hypertrophy result in mild right and moderate left lateral recess stenosis and moderate to severe bilateral neural foraminal stenosis, unchanged. Prominent dorsal epidural fat contributes to unchanged mild spinal stenosis.   L2-3: Circumferential disc bulging, moderate facet and ligamentum flavum hypertrophy, and prominent dorsal epidural fat result in mild-to-moderate right and moderate left lateral recess stenosis, moderate spinal stenosis, and mild-to-moderate right and moderate left neural foraminal stenosis, overall slightly progressed from prior (most notably the spinal and left lateral recess stenosis).   L3-4: Circumferential disc bulging and severe facet and ligamentum flavum hypertrophy result in moderate spinal stenosis, mild-to-moderate bilateral lateral recess stenosis, and mild-to-moderate right and moderate left neural foraminal stenosis, unchanged.   L4-5: Anterolisthesis with mild rightward bulging of uncovered disc and severe facet and ligamentum flavum hypertrophy result in mild to moderate spinal stenosis, mild-to-moderate right and mild left lateral recess stenosis, and moderate right and mild left neural foraminal stenosis, unchanged.   L5-S1: Circumferential disc bulging, severe disc space height loss, and moderate facet hypertrophy result in mild-to-moderate bilateral lateral recess and severe bilateral neural foraminal  stenosis without spinal stenosis, unchanged.   IMPRESSION: 1. Slightly progressive findings at L2-3 including moderate spinal stenosis, moderate left lateral recess stenosis, and moderate left neural foraminal stenosis. 2. Unchanged disc and facet degeneration elsewhere as above.     Electronically Signed   By: [name] M.D.   On: 02/01/2018 14:40     Objective:  VS:  HT:    WT:   BMI:     BP:   HR: bpm  TEMP: ( )  RESP:  Physical Exam Vitals and nursing note reviewed.  Constitutional:      General: She is not in acute distress.    Appearance: Normal appearance. She is not ill-appearing.  HENT:     Head: Normocephalic and atraumatic.     Right Ear: External ear normal.     Left Ear: External ear normal.  Eyes:     Extraocular Movements: Extraocular movements intact.  Cardiovascular:     Rate and Rhythm: Normal rate.     Pulses: Normal pulses.  Pulmonary:     Effort: Pulmonary effort is normal. No respiratory distress.  Abdominal:     General: There is no distension.     Palpations: Abdomen is soft.  Musculoskeletal:        General: Tenderness present.     Cervical back: Neck supple.     Right lower leg: No edema.     Left lower leg: No edema.     Comments: Patient has good distal strength with no pain over the  greater trochanters.  No clonus or focal weakness.  Skin:    Findings: No erythema, lesion or rash.  Neurological:     General: No focal deficit present.     Mental Status: She is alert and oriented to person, place, and time.     Sensory: No sensory deficit.     Motor: No weakness or abnormal muscle tone.     Coordination: Coordination normal.  Psychiatric:        Mood and Affect: Mood normal.        Behavior: Behavior normal.     Imaging: No results found.

## 2021-09-06 NOTE — Progress Notes (Signed)
Wendy Carrillo - 80 y.o. female MRN [id number]  Date of birth: 04/18/1941  Office Visit Note: Visit Date: 08/29/2021 PCP: Jilda Panda, MD Referred by: Jilda Panda, MD  Subjective: No chief complaint on file.  HPI:  Wendy Carrillo is a 80 y.o. female who comes in todayFor continued evaluation and management of a combination of chronic back pain status post lumbar fusion and lumbar laminectomy as well as history of knee arthritis and knee pain with knee arthroplasty.  Patient's clinical course is very complicated to me at this point.  By way of brief review just to try to make things clear patient was seeing Dr. Anderson Malta with bilateral leg pain as well as bilateral knee pain.  When he first saw her it was more right-sided hip and knee pain and he requested a right intra-articular hip injection because she was having some groin pain and there were some changes on x-ray.  When I saw her for subsequent injection it was really left-sided hip pain and it was lateral and anterior to the groin.  Not so much worse with rotation on exam but did have some features of that.  Some pain around the greater trochanter.  To make matters somewhat complicated she has had lumbar fusion with MRI in 2020 showing left foraminal narrowing at L2 which could cause the same pain.  My last note talked about that but what I was not aware of if she actually had foraminotomies at this level in 2021 and has had no subsequent imaging since that time.  She is seen by Dr. Saintclair Halsted who has done the surgery but has not had follow-up with him recently.  Again to make things even more confusing she was in chronic pain management with [facility] with a significant amount of opioid medication but does not take that now.  I did review her chronic opioid database and this shows that she has not had prescriptions since August.  Today she is just begging for some type of injection because the pain is so quite severe and this is  on the left side.  She denies any radicular paresthesias no focal weakness just a lot of pain particularly on the left side.  Interestingly when she went back to see Dr. Marlou Sa recently she had reported good relief after the injection on the left but he had mistakenly thought we had done a left-sided transforaminal epidural.  He wanted her to come back for a right sided hip injection but again today she is distraught about the pain on her left thigh.  Review of Systems  Musculoskeletal:  Positive for back pain and joint pain.  All other systems reviewed and are negative. Otherwise per HPI.  Assessment & Plan: Visit Diagnoses:    ICD-10-CM   1. Lumbar radiculopathy  M54.16 Ambulatory referral to Physical Medicine Rehab    2. Pain in right hip  M25.551 XR C-ARM NO REPORT    3. Greater trochanteric bursitis, left  M70.62     4. Post laminectomy syndrome  M96.1     5. Chronic pain of both knees  M25.561    M25.562    G89.29       Plan: Findings:  1.  Left lower back left lateral hip some anterior hip and thigh pain in the setting of prior foraminotomy at L2-3 and L3 by Dr. Saintclair Halsted in 2021 with no subsequent imaging.  She had some relief with left intra-articular hip injection just a couple  of weeks ago and while she endorses that she is in severe pain today to the point where she is almost begging to have some type of injection done.  On exam she has pain to palpation over the left greater trochanter and so we elected to complete that injection today.  Would still contemplate diagnostic epidural injection but I think she really needs to follow-up with Dr. Saintclair Halsted if that is the case.  2.  Secondary biggest problem is right knee pain.  Dr. Marlou Sa and requested right hip injection thinking may be the knee pain was referral pain from the hip.  She has no real hip and groin pain today its all left-sided.  We again are not going to complete the right side today and she will continue to follow-up with Dr. Marlou Sa  for her knee.   Meds & Orders: No orders of the defined types were placed in this encounter.   Orders Placed This Encounter  Procedures   Large Joint Inj   XR C-ARM NO REPORT   Ambulatory referral to Physical Medicine Rehab    Follow-up: No follow-ups on file.   Procedures: Large Joint Inj: L greater trochanter on 08/29/2021 3:15 PM Indications: pain and diagnostic evaluation Details: 22 G 3.5 in needle, fluoroscopy-guided lateral approach  Arthrogram: No  Medications: 4 mL lidocaine 2 %; 4 mL bupivacaine 0.25 %; 60 mg triamcinolone acetonide 40 MG/ML Outcome: tolerated well, no immediate complications  There was excellent flow of contrast outlined the greater trochanteric bursa without vascular uptake. Procedure, treatment alternatives, risks and benefits explained, specific risks discussed. Consent was given by the patient. Immediately prior to procedure a time out was called to verify the correct patient, procedure, equipment, support staff and site/side marked as required. Patient was prepped and draped in the usual sterile fashion.         Clinical History: MRI LUMBAR SPINE WITHOUT AND WITH CONTRAST   TECHNIQUE: Multiplanar and multiecho pulse sequences of the lumbar spine were obtained without and with intravenous contrast.   CONTRAST:  10 cc Gadavist intravenous   COMPARISON:  02/24/2019   FINDINGS: Segmentation:  5 lumbar type vertebrae   Alignment: Grade 1 anterolisthesis at L4-5. Mild retrolisthesis at T12-L1 to L2-3. Mild scoliotic curvature   Vertebrae: Negative for fracture or aggressive bone lesion. Mild discogenic edema at T12-L1 and L1-2. Hemangioma in the L4 body   Conus medullaris and cauda equina: Conus extends to the L1-2 level. Conus and cauda equina appear normal.   Paraspinal and other soft tissues: Postoperative scarring after decompression at L2 to L4. right renal cystic intensity.   Disc levels:   T12- L1: Disc narrowing asymmetric to  the right with bulge and endplate ridging. Moderate right foraminal stenosis.   L1-L2: Disc narrowing and bulging with endplate ridging. Posterior element hypertrophy. Combined with dorsal epidural fat there is moderate effacement of the thecal sac, progressed. Moderate bilateral foraminal narrowing, borderline advanced   L2-L3: Disc narrowing and bulging with retrolisthesis. There is advanced posterior element hypertrophy. Mild-to-moderate spinal stenosis. Advanced left foraminal stenosis, more moderate on the right   L3-L4: Disc narrowing and bulging asymmetric to the left. Advanced posterior element hypertrophy. Patent spinal canal after decompression. Left more than right foraminal impingement, advanced on the left   L4-L5: Advanced facet arthropathy with spurring and anterolisthesis. The disc is narrowed and bulging. Mild-to-moderate bilateral foraminal stenosis. Mild spinal stenosis.   L5-S1:Greatest level of degenerative disc narrowing with disc bulging and endplate ridging. There is posterior  element hypertrophy. Moderate bilateral foraminal narrowing. Patent spinal canal   IMPRESSION: 1. 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 No focal left-sided changes since February 24, 2019. 2. 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 Thecal sac narrowing is due to degenerative disease and dorsal epidural fat. 3. L2-3 and L3-4 left more than right foraminal impingement, advanced on the left. 4. Moderate foraminal narrowing at L5-S1. At least moderate foraminal narrowing at T12-L1 and L1-2. 5. L2-3 to L4-5 patent spinal canal after remote laminectomy.     Electronically Signed   By: Monte Fantasia M.D.   On: 05/19/2019 04:19 On: 02/01/2018 14:40     Objective:  VS:  HT:    WT:   BMI:     BP:   HR: bpm  TEMP: ( )  RESP:  Physical Exam Vitals and nursing note reviewed.  Constitutional:      General: She is not  in acute distress.    Appearance: Normal appearance. She is obese. She is not ill-appearing.  HENT:     Head: Normocephalic and atraumatic.     Right Ear: External ear normal.     Left Ear: External ear normal.  Eyes:     Extraocular Movements: Extraocular movements intact.  Cardiovascular:     Rate and Rhythm: Normal rate.     Pulses: Normal pulses.  Pulmonary:     Effort: Pulmonary effort is normal. No respiratory distress.  Abdominal:     General: There is no distension.     Palpations: Abdomen is soft.  Musculoskeletal:        General: Tenderness present.     Cervical back: Neck supple.     Right lower leg: No edema.     Left lower leg: No edema.     Comments: Patient has good distal strength with exquisite pain over the left greater trochanters.  No clonus or focal weakness.  No real pain with internal rotation of the left hip.  Skin:    Findings: No erythema, lesion or rash.  Neurological:     General: No focal deficit present.     Mental Status: She is alert and oriented to person, place, and time.     Sensory: No sensory deficit.     Motor: No weakness or abnormal muscle tone.     Coordination: Coordination normal.     Gait: Gait abnormal.  Psychiatric:        Mood and Affect: Mood normal.        Behavior: Behavior normal.     Imaging: No results found.

## 2021-09-07 ENCOUNTER — Ambulatory Visit (INDEPENDENT_AMBULATORY_CARE_PROVIDER_SITE_OTHER): Payer: Medicare PPO | Admitting: Physical Medicine and Rehabilitation

## 2021-09-07 ENCOUNTER — Other Ambulatory Visit: Payer: Self-pay

## 2021-09-07 ENCOUNTER — Encounter: Payer: Self-pay | Admitting: Physical Medicine and Rehabilitation

## 2021-09-07 ENCOUNTER — Ambulatory Visit: Payer: Self-pay

## 2021-09-07 VITALS — BP 139/78 | HR 76

## 2021-09-07 DIAGNOSIS — M47816 Spondylosis without myelopathy or radiculopathy, lumbar region: Secondary | ICD-10-CM | POA: Diagnosis not present

## 2021-09-07 MED ORDER — METHYLPREDNISOLONE ACETATE 80 MG/ML IJ SUSP
80.0000 mg | Freq: Once | INTRAMUSCULAR | Status: AC
  Administered 2021-09-07: 80 mg

## 2021-09-07 NOTE — Progress Notes (Signed)
Pt state her left hip pain is worse than her right. Pt state walking makes the pain worse. Pt state she takes pain meds to help ease her pain.  Numeric Pain Rating Scale and Functional Assessment Average Pain 5   In the last MONTH (on 0-10 scale) has pain interfered with the following?  1. General activity like being  able to carry out your everyday physical activities such as walking, climbing stairs, carrying groceries, or moving a chair?  Rating(7)   +Driver, -BT, -Dye Allergies.

## 2021-09-07 NOTE — Patient Instructions (Signed)

## 2021-09-13 ENCOUNTER — Telehealth: Payer: Self-pay | Admitting: Physical Medicine and Rehabilitation

## 2021-09-13 NOTE — Telephone Encounter (Signed)
Patient called advised she is having so much pain in her left thigh. Patient asked if she can get another injection?  Patient said it is hard for her to sleep. The number to contact patient is [phone]

## 2021-09-13 NOTE — Telephone Encounter (Signed)
Patient's injection was on 11/3. Please advise.

## 2021-09-15 MED ORDER — TRAMADOL HCL 50 MG PO TABS: 50.0000 mg | ORAL_TABLET | Freq: Three times a day (TID) | ORAL | 0 refills | Status: AC | PRN

## 2021-09-15 NOTE — Telephone Encounter (Signed)
Called patient to advise about prescription and increasing gabapentin. She expressed understanding. She states that she does not go to [facility] anymore. She also said she needed to cancel her injection appointment and would "try to get by on the medicine" you are giving her. I did explain again that she will need to see pain management for any prescriptions after this.

## 2021-09-15 NOTE — Telephone Encounter (Signed)
Patient states that the pain is in her lateral left hip and worse with lying down.  Scheduled for repeat greater trochanteric bursa injection. She is requesting a prescription of something to help with the pain. Pharmacy is correct. Please advise.

## 2021-09-15 NOTE — Telephone Encounter (Signed)
Pt calling saying she is in terrible pain but have not heard back since she called earlier this week. Pt would like a call back, the best call back number is [phone].

## 2021-09-17 ENCOUNTER — Encounter: Payer: Self-pay | Admitting: Physical Medicine and Rehabilitation

## 2021-09-17 MED ORDER — BUPIVACAINE HCL 0.25 % IJ SOLN
4.0000 mL | INTRAMUSCULAR | Status: AC | PRN
  Administered 2021-08-29: 4 mL via INTRA_ARTICULAR

## 2021-09-17 MED ORDER — TRIAMCINOLONE ACETONIDE 40 MG/ML IJ SUSP
60.0000 mg | INTRAMUSCULAR | Status: AC | PRN
  Administered 2021-08-29: 60 mg via INTRA_ARTICULAR

## 2021-09-17 MED ORDER — LIDOCAINE HCL 2 % IJ SOLN
4.0000 mL | INTRAMUSCULAR | Status: AC | PRN
  Administered 2021-08-29: 4 mL

## 2021-09-17 NOTE — Progress Notes (Signed)
Wendy Carrillo - 80 y.o. female MRN [id number]  Date of birth: 02/24/1941  Office Visit Note: Visit Date: 09/07/2021 PCP: Jilda Panda, MD Referred by: Jilda Panda, MD  Subjective: Chief Complaint  Patient presents with   Left Hip - Pain   HPI:  Wendy Carrillo is a 80 y.o. female who comes in todayFor planned follow-up status post left greater trochanteric hip injection.  Please see our prior notes for full details.  Patient's case is very complicated at this point.  To make things a little more complicated I did review the surgeries by Dr. Saintclair Halsted and he actually completed in 2019 a left foraminotomies at several levels with decompression centrally.  These were L2 and L3 and L4.  In 2021 there was actually a redo on the left.  Her symptoms are mainly on the left side but now she comes in with significant relief from greater trochanteric injection.  She appears to have a lot less pain than she had when we saw her just a week or so ago.  She reports most of the pain now is in the lower back and posterior hip area.  No groin pain on either side continues to have right knee pain.  We will continue to follow-up with Dr. Marlou Sa for her right knee.  We will complete facet joint injections today to see if that helps diagnostically with her back pain.  Depending on relief would have her follow-up with Dr. 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  Patient has a history of chronic pain management but is not in that at this point.  She may need referral appropriately at some point again.  ROS Otherwise per HPI.  Assessment & Plan: Visit Diagnoses:    ICD-10-CM   1. Spondylosis without myelopathy or radiculopathy, lumbar region  M47.816 XR C-ARM NO REPORT    Facet Injection    methylPREDNISolone acetate (DEPO-MEDROL) injection 80 mg      Plan: No additional findings.   Meds & Orders:  Meds ordered this encounter  Medications    methylPREDNISolone acetate (DEPO-MEDROL) injection 80 mg    Orders Placed This Encounter  Procedures   Facet Injection   XR C-ARM NO REPORT    Follow-up: Return for visit to requesting physician as needed.   Procedures: No procedures performed  Lumbar Facet Joint Intra-Articular Injection(s) with Fluoroscopic Guidance  Patient: Wendy Carrillo      Date of Birth: December 13, 1940 MRN: [id number] PCP: Jilda Panda, MD      Visit Date: 09/07/2021   Universal Protocol:    Date/Time: 09/07/2021  Consent Given By: the patient  Position: PRONE   Additional Comments: Vital signs were monitored before and after the procedure. Patient was prepped and draped in the usual sterile fashion. The correct patient, procedure, and site was verified.   Injection Procedure Details:  Procedure Site One Meds Administered:  Meds ordered this encounter  Medications   methylPREDNISolone acetate (DEPO-MEDROL) injection 80 mg     Laterality: Bilateral  Location/Site:  L4-L5  Needle size: 22 guage  Needle type: Spinal  Needle Placement: Articular  Findings:  -Comments: Excellent flow of contrast producing a partial arthrogram.  Procedure Details: The fluoroscope beam is vertically oriented in AP, and the inferior recess is visualized beneath the lower pole of the inferior apophyseal process, which represents the target point for needle insertion. When direct visualization is difficult the target point is located at  the medial projection of the vertebral pedicle. The region overlying each aforementioned target is locally anesthetized with a 1 to 2 ml. volume of 1% Lidocaine without Epinephrine.   The spinal needle was inserted into each of the above mentioned facet joints using biplanar fluoroscopic guidance. A 0.25 to 0.5 ml. volume of Isovue-250 was injected and a partial facet joint arthrogram was obtained. A single spot film was obtained of the resulting arthrogram.    One to 1.25 ml of the  steroid/anesthetic solution was then injected into each of the facet joints noted above.   Additional Comments:  The patient tolerated the procedure well Dressing: 2 x 2 sterile gauze and Band-Aid    Post-procedure details: Patient was observed during the procedure. Post-procedure instructions were reviewed.  Patient left the clinic in stable condition.     Clinical History: MRI LUMBAR SPINE WITHOUT AND WITH CONTRAST   TECHNIQUE: Multiplanar and multiecho pulse sequences of the lumbar spine were obtained without and with intravenous contrast.   CONTRAST:  10 cc Gadavist intravenous   COMPARISON:  02/24/2019   FINDINGS: Segmentation:  5 lumbar type vertebrae   Alignment: Grade 1 anterolisthesis at L4-5. Mild retrolisthesis at T12-L1 to L2-3. Mild scoliotic curvature   Vertebrae: Negative for fracture or aggressive bone lesion. Mild discogenic edema at T12-L1 and L1-2. Hemangioma in the L4 body   Conus medullaris and cauda equina: Conus extends to the L1-2 level. Conus and cauda equina appear normal.   Paraspinal and other soft tissues: Postoperative scarring after decompression at L2 to L4. right renal cystic intensity.   Disc levels:   T12- L1: Disc narrowing asymmetric to the right with bulge and endplate ridging. Moderate right foraminal stenosis.   L1-L2: Disc narrowing and bulging with endplate ridging. Posterior element hypertrophy. Combined with dorsal epidural fat there is moderate effacement of the thecal sac, progressed. Moderate bilateral foraminal narrowing, borderline advanced   L2-L3: Disc narrowing and bulging with retrolisthesis. There is advanced posterior element hypertrophy. Mild-to-moderate spinal stenosis. Advanced left foraminal stenosis, more moderate on the right   L3-L4: Disc narrowing and bulging asymmetric to the left. Advanced posterior element hypertrophy. Patent spinal canal after decompression. Left more than right foraminal  impingement, advanced on the left   L4-L5: Advanced facet arthropathy with spurring and anterolisthesis. The disc is narrowed and bulging. Mild-to-moderate bilateral foraminal stenosis. Mild spinal stenosis.   L5-S1:Greatest level of degenerative disc narrowing with disc bulging and endplate ridging. There is posterior element hypertrophy. Moderate bilateral foraminal narrowing. Patent spinal canal   IMPRESSION: 1. 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 No focal left-sided changes since February 24, 2019. 2. 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 Thecal sac narrowing is due to degenerative disease and dorsal epidural fat. 3. L2-3 and L3-4 left more than right foraminal impingement, advanced on the left. 4. Moderate foraminal narrowing at L5-S1. At least moderate foraminal narrowing at T12-L1 and L1-2. 5. L2-3 to L4-5 patent spinal canal after remote laminectomy.     Electronically Signed   By: Monte Fantasia M.D.   On: 05/19/2019 04:19 On: 02/01/2018 14:40     Objective:  VS:  HT:    WT:   BMI:     BP:139/78  HR:76bpm  TEMP: ( )  RESP:  Physical Exam Vitals and nursing note reviewed.  Constitutional:      General: She is not in acute distress.    Appearance: Normal appearance. She is obese. She is not  ill-appearing.  HENT:     Head: Normocephalic and atraumatic.     Right Ear: External ear normal.     Left Ear: External ear normal.  Eyes:     Extraocular Movements: Extraocular movements intact.  Cardiovascular:     Rate and Rhythm: Normal rate.     Pulses: Normal pulses.  Pulmonary:     Effort: Pulmonary effort is normal. No respiratory distress.  Abdominal:     General: There is no distension.     Palpations: Abdomen is soft.  Musculoskeletal:        General: Tenderness present.     Cervical back: Neck supple.     Right lower leg: No edema.     Left lower leg: No edema.     Comments: Patient  has good distal strength with no pain over the greater trochanters.  No clonus or focal weakness.  Pain with extension and facet loading.  Less pain over the left greater trochanter than the last time I saw him.  No pain with hip rotation internally.  Skin:    Findings: No erythema, lesion or rash.  Neurological:     General: No focal deficit present.     Mental Status: She is alert and oriented to person, place, and time.     Sensory: No sensory deficit.     Motor: No weakness or abnormal muscle tone.     Coordination: Coordination normal.  Psychiatric:        Mood and Affect: Mood normal.        Behavior: Behavior normal.     Imaging: No results found.

## 2021-09-17 NOTE — Procedures (Signed)
Lumbar Facet Joint Intra-Articular Injection(s) with Fluoroscopic Guidance  Patient: Wendy Carrillo      Date of Birth: 06-Jan-1941 MRN: [id number] PCP: Jilda Panda, MD      Visit Date: 09/07/2021   Universal Protocol:    Date/Time: 09/07/2021  Consent Given By: the patient  Position: PRONE   Additional Comments: Vital signs were monitored before and after the procedure. Patient was prepped and draped in the usual sterile fashion. The correct patient, procedure, and site was verified.   Injection Procedure Details:  Procedure Site One Meds Administered:  Meds ordered this encounter  Medications   methylPREDNISolone acetate (DEPO-MEDROL) injection 80 mg     Laterality: Bilateral  Location/Site:  L4-L5  Needle size: 22 guage  Needle type: Spinal  Needle Placement: Articular  Findings:  -Comments: Excellent flow of contrast producing a partial arthrogram.  Procedure Details: The fluoroscope beam is vertically oriented in AP, and the inferior recess is visualized beneath the lower pole of the inferior apophyseal process, which represents the target point for needle insertion. When direct visualization is difficult the target point is located at the medial projection of the vertebral pedicle. The region overlying each aforementioned target is locally anesthetized with a 1 to 2 ml. volume of 1% Lidocaine without Epinephrine.   The spinal needle was inserted into each of the above mentioned facet joints using biplanar fluoroscopic guidance. A 0.25 to 0.5 ml. volume of Isovue-250 was injected and a partial facet joint arthrogram was obtained. A single spot film was obtained of the resulting arthrogram.    One to 1.25 ml of the steroid/anesthetic solution was then injected into each of the facet joints noted above.   Additional Comments:  The patient tolerated the procedure well Dressing: 2 x 2 sterile gauze and Band-Aid    Post-procedure details: Patient was observed  during the procedure. Post-procedure instructions were reviewed.  Patient left the clinic in stable condition.

## 2021-09-19 ENCOUNTER — Telehealth: Payer: Self-pay | Admitting: Physical Medicine and Rehabilitation

## 2021-09-19 DIAGNOSIS — M792 Neuralgia and neuritis, unspecified: Secondary | ICD-10-CM

## 2021-09-19 MED ORDER — GABAPENTIN 300 MG PO CAPS: ORAL_CAPSULE | ORAL | 1 refills | Status: DC

## 2021-09-19 NOTE — Addendum Note (Signed)
Addended by: Raymondo Band on: 09/19/2021 01:02 PM   Modules accepted: Orders

## 2021-09-19 NOTE — Telephone Encounter (Signed)
Please advise 

## 2021-09-19 NOTE — Telephone Encounter (Signed)
Pt states she needs new script or a note to give to her primary care doctor that she needs to take 2 gabapentin.   CB  [phone]

## 2021-09-20 NOTE — Telephone Encounter (Signed)
Called patient to advise. She expressed understanding.

## 2021-10-03 ENCOUNTER — Ambulatory Visit: Payer: Medicare PPO | Admitting: Physical Medicine and Rehabilitation

## 2021-11-11 ENCOUNTER — Emergency Department (HOSPITAL_BASED_OUTPATIENT_CLINIC_OR_DEPARTMENT_OTHER)
Admission: EM | Admit: 2021-11-11 | Discharge: 2021-11-11 | Disposition: A | Discharge status: Home / Self Care | Payer: Medicare PPO | Attending: Emergency Medicine | Admitting: Emergency Medicine

## 2021-11-11 ENCOUNTER — Emergency Department (HOSPITAL_BASED_OUTPATIENT_CLINIC_OR_DEPARTMENT_OTHER): Payer: Medicare PPO

## 2021-11-11 ENCOUNTER — Encounter (HOSPITAL_BASED_OUTPATIENT_CLINIC_OR_DEPARTMENT_OTHER): Payer: Self-pay | Admitting: Emergency Medicine

## 2021-11-11 ENCOUNTER — Other Ambulatory Visit: Payer: Self-pay

## 2021-11-11 DIAGNOSIS — K219 Gastro-esophageal reflux disease without esophagitis: Secondary | ICD-10-CM | POA: Insufficient documentation

## 2021-11-11 DIAGNOSIS — N3001 Acute cystitis with hematuria: Secondary | ICD-10-CM | POA: Diagnosis not present

## 2021-11-11 DIAGNOSIS — N950 Postmenopausal bleeding: Secondary | ICD-10-CM | POA: Insufficient documentation

## 2021-11-11 DIAGNOSIS — Z79899 Other long term (current) drug therapy: Secondary | ICD-10-CM | POA: Diagnosis not present

## 2021-11-11 DIAGNOSIS — Z20822 Contact with and (suspected) exposure to covid-19: Secondary | ICD-10-CM | POA: Diagnosis not present

## 2021-11-11 DIAGNOSIS — Z91040 Latex allergy status: Secondary | ICD-10-CM | POA: Diagnosis not present

## 2021-11-11 DIAGNOSIS — I10 Essential (primary) hypertension: Secondary | ICD-10-CM | POA: Diagnosis not present

## 2021-11-11 DIAGNOSIS — R1032 Left lower quadrant pain: Secondary | ICD-10-CM | POA: Diagnosis present

## 2021-11-11 LAB — COMPREHENSIVE METABOLIC PANEL
ALT: 11 U/L (ref 0–44)
AST: 14 U/L — ABNORMAL LOW (ref 15–41)
Albumin: 3.5 g/dL (ref 3.5–5.0)
Alkaline Phosphatase: 47 U/L (ref 38–126)
Anion gap: 8 (ref 5–15)
BUN: 16 mg/dL (ref 8–23)
CO2: 28 mmol/L (ref 22–32)
Calcium: 8.5 mg/dL — ABNORMAL LOW (ref 8.9–10.3)
Chloride: 102 mmol/L (ref 98–111)
Creatinine, Ser: 0.83 mg/dL (ref 0.44–1.00)
GFR, Estimated: >60 mL/min (ref >60)
Glucose, Bld: 97 mg/dL (ref 70–99)
Potassium: 4.1 mmol/L (ref 3.5–5.1)
Sodium: 138 mmol/L (ref 135–145)
Total Bilirubin: 0.8 mg/dL (ref 0.3–1.2)
Total Protein: 6.2 g/dL — ABNORMAL LOW (ref 6.5–8.1)

## 2021-11-11 LAB — CBC WITH DIFFERENTIAL/PLATELET
Abs Immature Granulocytes: 0.01 10*3/uL (ref 0.00–0.07)
Basophils Absolute: 0 10*3/uL (ref 0.0–0.1)
Basophils Relative: 1 %
Eosinophils Absolute: 0.2 10*3/uL (ref 0.0–0.5)
Eosinophils Relative: 4 %
HCT: 40.4 % (ref 36.0–46.0)
Hemoglobin: 12.9 g/dL (ref 12.0–15.0)
Immature Granulocytes: 0 %
Lymphocytes Relative: 35 %
Lymphs Abs: 1.7 10*3/uL (ref 0.7–4.0)
MCH: 30 pg (ref 26.0–34.0)
MCHC: 31.9 g/dL (ref 30.0–36.0)
MCV: 94 fL (ref 80.0–100.0)
Monocytes Absolute: 0.6 10*3/uL (ref 0.1–1.0)
Monocytes Relative: 11 %
Neutro Abs: 2.4 10*3/uL (ref 1.7–7.7)
Neutrophils Relative %: 49 %
Platelets: 187 10*3/uL (ref 150–400)
RBC: 4.3 MIL/uL (ref 3.87–5.11)
RDW: 13 % (ref 11.5–15.5)
WBC: 5 10*3/uL (ref 4.0–10.5)
nRBC: 0 % (ref 0.0–0.2)

## 2021-11-11 LAB — URINALYSIS, ROUTINE W REFLEX MICROSCOPIC
Bilirubin Urine: NEGATIVE
Glucose, UA: NEGATIVE mg/dL
Ketones, ur: NEGATIVE mg/dL
Nitrite: NEGATIVE
Protein, ur: NEGATIVE mg/dL
Specific Gravity, Urine: 1.015 (ref 1.005–1.030)
pH: 5.5 (ref 5.0–8.0)

## 2021-11-11 LAB — URINALYSIS, MICROSCOPIC (REFLEX)

## 2021-11-11 LAB — WET PREP, GENITAL
Clue Cells Wet Prep HPF POC: NONE SEEN
Sperm: NONE SEEN
Trich, Wet Prep: NONE SEEN
WBC, Wet Prep HPF POC: <10 (ref <10)
Yeast Wet Prep HPF POC: NONE SEEN

## 2021-11-11 LAB — RESP PANEL BY RT-PCR (FLU A&B, COVID) ARPGX2
Influenza A by PCR: NEGATIVE
Influenza B by PCR: NEGATIVE
SARS Coronavirus 2 by RT PCR: NEGATIVE

## 2021-11-11 LAB — LIPASE, BLOOD: Lipase: 23 U/L (ref 11–51)

## 2021-11-11 MED ORDER — SULFAMETHOXAZOLE-TRIMETHOPRIM 800-160 MG PO TABS: 1.0000 | ORAL_TABLET | Freq: Two times a day (BID) | ORAL | 0 refills | Status: AC

## 2021-11-11 MED ORDER — ONDANSETRON 4 MG PO TBDP
4.0000 mg | ORAL_TABLET | Freq: Once | ORAL | Status: AC
  Administered 2021-11-11: 4 mg via ORAL
  Filled 2021-11-11: qty 1

## 2021-11-11 MED ORDER — NITROFURANTOIN MONOHYD MACRO 100 MG PO CAPS: 100.0000 mg | ORAL_CAPSULE | Freq: Two times a day (BID) | ORAL | 0 refills | Status: DC

## 2021-11-11 MED ORDER — OXYCODONE-ACETAMINOPHEN 5-325 MG PO TABS
1.0000 | ORAL_TABLET | Freq: Once | ORAL | Status: AC
  Administered 2021-11-11: 1 via ORAL
  Filled 2021-11-11: qty 1

## 2021-11-11 NOTE — Discharge Instructions (Addendum)
Your workup today was very reassuring. Your CT scan was negative, your labs looked good, and we could not find a source of bleeding on your pelvic exam. However, your urine shows evidence a small infection, which could be where the bleeding is actually coming from. I have sent in an antibiotic for you to take. Please follow up with your PCP in one week to recheck urine. Your COVID and flu tests were negative.  If you weakness continues to worsen, you develop shortness of breath, chest pain and fevers, please return to the ED.

## 2021-11-11 NOTE — ED Notes (Signed)
IV attempt x2 neither would advance into vein

## 2021-11-11 NOTE — ED Notes (Signed)
Patient transported to CT 

## 2021-11-11 NOTE — ED Provider Notes (Signed)
[facility] Provider Note   CSN: [id number] Arrival date & time: 11/11/21  1208     History  Chief Complaint  Patient presents with   Abdominal Pain    Wendy Carrillo is a 81 y.o. female with history of GERD, duodenal ulcer, chronic lower back pain, gastritis hepatitis in 1960, hypertension presents to ED for evaluation of left lower quadrant and suprapubic pain as well as vaginal pain and bleeding.  Symptoms started 2 days prior, patient first noticed light pink blood while she was bathing.  Today she noticed bright red blood with clot after using the bathroom.  She also endorses significant vaginal pain and soreness.  She also has occasional nausea and feels very weak.  Per her husband, patient was unable to complete her walk that they do together due to weakness.  No treatment prior to arrival.  She denies constipation, diarrhea, urinary symptoms.   Abdominal Pain Associated symptoms: vaginal bleeding   Associated symptoms: no dysuria, no fever, no shortness of breath and no vomiting       Home Medications Prior to Admission medications   Medication Sig Start Date End Date Taking? Authorizing Provider  ALPRAZolam (XANAX) 0.25 MG tablet Take 1 tablet (0.25 mg total) by mouth daily as needed. Patient taking differently: Take 0.25 mg by mouth daily as needed for anxiety. 02/24/15   Rai, Vernelle Emerald, MD  celecoxib (CELEBREX) 200 MG capsule Take 200 mg by mouth daily as needed for mild pain.  05/16/10   [provider]  cyclobenzaprine (FLEXERIL) 10 MG tablet Take 1 tablet (10 mg total) by mouth once as needed for up to 1 dose for muscle spasms. 06/19/21   Redwine, Madison A, PA-C  furosemide (LASIX) 20 MG tablet Take 20 mg by mouth daily as needed for fluid.     [provider]  gabapentin (NEURONTIN) 300 MG capsule Take 2 capsules by mouth three times per day. Can slowly increase from current prescription. 09/19/21   Magnus Sinning, MD   loratadine (CLARITIN) 10 MG tablet Take 10 mg by mouth daily. In the morning    [provider]  meclizine (ANTIVERT) 25 MG tablet Take 25 mg by mouth 3 (three) times daily as needed for dizziness.  12/02/19   [provider]  methocarbamol (ROBAXIN) 500 MG tablet Take 1 tablet (500 mg total) by mouth every 8 (eight) hours as needed for muscle spasms. 08/18/21   Meredith Pel, MD  Multiple Vitamins-Minerals (AIRBORNE GUMMIES PO) Take by mouth.    [provider]  pantoprazole (PROTONIX) 20 MG tablet Take 1 tablet (20 mg total) by mouth daily. Patient taking differently: Take 20 mg by mouth at bedtime. 08/27/16   Harlin Heys, MD  predniSONE (DELTASONE) 20 MG tablet 3 Tabs PO Days 1-3, then 2 tabs PO Days 4-6, then 1 tab PO Day 7-9, then Half Tab PO Day 10-12 06/19/21   Carlisle Cater, PA-C  ramipril (ALTACE) 2.5 MG capsule Take 2.5 mg by mouth daily. 02/17/15   [provider]  sucralfate (CARAFATE) 1 g tablet Take 1 g by mouth 4 (four) times daily. 05/16/21   [provider]  Vitamin D, Ergocalciferol, (DRISDOL) 50000 UNITS CAPS capsule Take 50,000 Units by mouth every Thursday.     [provider]      Allergies    Contrast media [iodinated contrast media], Penicillins, Baclofen, Latex, and Other    Review of Systems   Review of Systems  Constitutional:  Negative for fever.  HENT: Negative.    Eyes: Negative.   Respiratory:  Negative for shortness of breath.   Cardiovascular: Negative.   Gastrointestinal:  Positive for abdominal pain. Negative for vomiting.  Endocrine: Negative.   Genitourinary:  Positive for vaginal bleeding and vaginal pain. Negative for dysuria.  Musculoskeletal: Negative.   Skin:  Negative for rash.  Neurological:  Negative for headaches.  All other systems reviewed and are negative.  Physical Exam Updated Vital Signs BP (!) 133/56 (BP Location: Right Arm)    Pulse 76    Temp 98.1 F (36.7 C) (Oral)     Resp 18    Ht 5\' 4"  (1.626 m)    Wt 99.8 kg    SpO2 96%    BMI 37.76 kg/m  Physical Exam Vitals and nursing note reviewed.  Constitutional:      General: She is not in acute distress.    Appearance: She is not ill-appearing.  HENT:     Head: Atraumatic.  Eyes:     Conjunctiva/sclera: Conjunctivae normal.  Cardiovascular:     Rate and Rhythm: Normal rate and regular rhythm.     Pulses: Normal pulses.     Heart sounds: No murmur heard. Pulmonary:     Effort: Pulmonary effort is normal. No respiratory distress.     Breath sounds: Normal breath sounds.  Abdominal:     General: Abdomen is flat. There is no distension.     Palpations: Abdomen is soft.     Tenderness: There is abdominal tenderness in the suprapubic area, left upper quadrant and left lower quadrant. Negative signs include Murphy's sign and McBurney's sign.  Genitourinary:    Vagina: No vaginal discharge or erythema.     Comments: External pelvic exam without any lesions, sores; internal exam without any source of bleeding or infection.  She has mild tenderness during exam. Musculoskeletal:        General: Normal range of motion.     Cervical back: Normal range of motion.  Skin:    General: Skin is warm and dry.     Capillary Refill: Capillary refill takes less than 2 seconds.  Neurological:     General: No focal deficit present.     Mental Status: She is alert.  Psychiatric:        Mood and Affect: Mood normal.    ED Results / Procedures / Treatments   Labs (all labs ordered are listed, but only abnormal results are displayed) Labs Reviewed  COMPREHENSIVE METABOLIC PANEL - Abnormal; Notable for the following components:      Result Value   Calcium 8.5 (*)    Total Protein 6.2 (*)    AST 14 (*)    All other components within normal limits  URINALYSIS, ROUTINE W REFLEX MICROSCOPIC - Abnormal; Notable for the following components:   Hgb urine dipstick TRACE (*)    Leukocytes,Ua SMALL (*)    All other  components within normal limits  URINALYSIS, MICROSCOPIC (REFLEX) - Abnormal; Notable for the following components:   Bacteria, UA FEW (*)    All other components within normal limits  RESP PANEL BY RT-PCR (FLU A&B, COVID) ARPGX2  WET PREP, GENITAL  CBC WITH DIFFERENTIAL/PLATELET  LIPASE, BLOOD    EKG None  Radiology CT ABDOMEN PELVIS WO CONTRAST  Result Date: 11/11/2021 CLINICAL DATA:  Left lower quadrant abdominal pain. Vaginal pain. Postmenopausal vaginal bleeding. EXAM: CT ABDOMEN AND PELVIS WITHOUT CONTRAST TECHNIQUE: Multidetector CT imaging of the abdomen and pelvis  was performed following the standard protocol without IV contrast. COMPARISON:  CT scan of the pelvis November 10, 2020. CT scan of the abdomen and pelvis August 26, 2016. FINDINGS: Lower chest: Small hiatal hernia. Mild dependent atelectasis in the bases of the lungs. No other significant abnormalities in the lung bases. Hepatobiliary: No focal liver abnormality is seen. Status post cholecystectomy. No biliary dilatation. Pancreas: Unremarkable. No pancreatic ductal dilatation or surrounding inflammatory changes. Spleen: Normal in size without focal abnormality. Adrenals/Urinary Tract: A dominant cyst is seen in the upper pole the right kidney, stable since 2017. A smaller cyst is seen in the lower pole of the right kidney. No suspicious stones or masses. No hydronephrosis. The ureters are normal in caliber with no stones identified. The bladder is unremarkable. Stomach/Bowel: Other than the hiatal hernia, the stomach is normal in appearance. The small bowel is unremarkable. Colonic diverticulosis is identified in the sigmoid colon without diverticulitis. The appendix is not seen but no appendicitis is identified. Vascular/Lymphatic: Mild calcified atherosclerosis in the nonaneurysmal aorta. No adenopathy. Reproductive: Status post hysterectomy. No adnexal masses. Other: No other abnormalities. Musculoskeletal: No acute or  significant osseous findings. IMPRESSION: 1. No cause for the patient's symptoms identified. 2. Small hiatal hernia. 3. 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 Electronically Signed   By: Dorise Bullion III M.D.   On: 11/11/2021 14:02   DG Chest Portable 1 View  Result Date: 11/11/2021 CLINICAL DATA:  Shortness of breath and weakness EXAM: PORTABLE CHEST 1 VIEW COMPARISON:  08/26/2016 FINDINGS: Borderline cardiomegaly. No focal opacity, pleural effusion, or pneumothorax. IMPRESSION: No active disease. Electronically Signed   By: Donavan Foil M.D.   On: 11/11/2021 15:26    Procedures Procedures    Medications Ordered in ED Medications  oxyCODONE-acetaminophen (PERCOCET/ROXICET) 5-325 MG per tablet 1 tablet (1 tablet Oral Given 11/11/21 1319)  ondansetron (ZOFRAN-ODT) disintegrating tablet 4 mg (4 mg Oral Given 11/11/21 1319)    ED Course/ Medical Decision Making/ A&P Clinical Course as of 11/11/21 1808  Sat Nov 11, 2021  1343 Lipase, blood Lipase normal [EC]  1355 CBC with Differential CBC unremarkable [EC]  1414 CBC with Differential CBC normal [EC]  1414 Comprehensive metabolic panel(!) CMP unremarkable [EC]  1414 CT ABDOMEN PELVIS WO CONTRAST CT with no acute pathology or obvious cause for patient's symptoms [EC]    Clinical Course User Index [EC] Tonye Pearson, PA-C                           Medical Decision Making  Initial impression:  This patient presents to the ED for concern of vaginal bleeding, vaginal pain and severe LLQ pain, this involves an extensive number of treatment options, and is a complaint that carries with it a high risk of complications and morbidity.  The differential diagnosis includes Differential diagnosis for nonpregnant vaginal bleeding includes but is not limited to systemic causes such as cirrhosis of the liver, coagulopathy such as ITP or von Willebrand's disease, strep vaginitis, HRT, hypothyroidism, secondary anovulation.   Reproductive tract causes include adenomyosis, atrophic endometrium, dysfunctional uterine bleeding, endometriosis, fibroids, foreign body, infection, IUD, neoplasia or vaginal trauma.   - Patient has severe allergic reaction to contrast dye, will proceed with non-contrast CT abd/pelvis, CMP, CBC, lipase and UA.    Additional history obtained:  Additional history obtained from husband External records from outside source obtained and reviewed including recent labs and imaging   Lab Tests:  I  Ordered, reviewed, and interpreted labs.  The pertinent results include: As above in ED course.  UA with evidence of small UTI, wet prep negative, respiratory panel negative   Imaging Studies ordered:  I ordered imaging studies including chest x-ray and CT abdomen pelvis I independently visualized and interpreted imaging which showed no acute pathology I agree with the radiologist interpretation    Medicines ordered and prescription drug management:  I ordered medication including Percocet and Zofran  for pain and nausea  Reevaluation of the patient after these medicines showed that the patient improved I have reviewed the patients home medicines and have made adjustments as needed   Dispostion:  After consideration of the diagnostic results and the patients response to treatment feel that the patent would benefit from discharge with outpatient follow up: UTI-patient had a very extensive work-up which was largely reassuring.  Physical exam is also reassuring despite some suprapubic tenderness.  This coupled with her urinalysis results, patient's symptoms are likely due from a UTI.  It is very possible that the blood that she is seeing after urinating is from her bladder instead of her vagina.  She does have severe penicillin allergy so I prescribed her a 5-day course of Bactrim.  She is to follow-up with her PCP in 1 week to recheck her urine to ensure UTI has been treated.  All labs and imaging  were discussed with patient and her husband at bedside.  All questions asked and answered.  Discharged home in good condition. Final Clinical Impression(s) / ED Diagnoses Final diagnoses:  Abnormal vaginal bleeding in postmenopausal patient  Acute cystitis with hematuria    Rx / DC Orders ED Discharge Orders          Ordered    nitrofurantoin, macrocrystal-monohydrate, (MACROBID) 100 MG capsule  2 times daily,   Status:  Discontinued        11/11/21 1620    sulfamethoxazole-trimethoprim (BACTRIM DS) 800-160 MG tablet  2 times daily        11/11/21 [address] [place] 11/11/21 1814    Gareth Morgan, MD 11/12/21 0003

## 2021-11-11 NOTE — ED Triage Notes (Addendum)
Pt arrives pov, to room in wheelchair, c/o vaginal bleeding and vaginal pain x 2 days. Passed clot today. Pt reports occurs after bathing or showering or after wiping Endorses LLQ pain and Left lower back pain. Pt added that she has been having weakness x 3 days. AOx4, VAN negative

## 2021-11-29 ENCOUNTER — Other Ambulatory Visit: Payer: Self-pay | Admitting: Neurosurgery

## 2021-11-29 DIAGNOSIS — M5442 Lumbago with sciatica, left side: Secondary | ICD-10-CM

## 2021-11-29 DIAGNOSIS — M5440 Lumbago with sciatica, unspecified side: Secondary | ICD-10-CM

## 2021-12-04 ENCOUNTER — Telehealth: Payer: Self-pay

## 2021-12-04 MED ORDER — DIPHENHYDRAMINE HCL 50 MG PO TABS
50.0000 mg | ORAL_TABLET | Freq: Once | ORAL | 0 refills | Status: DC
Start: 2021-12-04 — End: 2022-10-11

## 2021-12-04 MED ORDER — PREDNISONE 50 MG PO TABS: ORAL_TABLET | ORAL | 0 refills | Status: DC

## 2021-12-04 NOTE — Progress Notes (Signed)
Phone call to patient to review instructions for 13 hr prep for NRB w/ contrast on 12/07/21 at 9 AM. Prescription called into [facility]. Pt aware and verbalized understanding of instructions. Prescription: Pt to take 50 mg of prednisone on 12/06/21 at 8 PM, 50 mg of prednisone on 12/07/21 at 2 AM, and 50 mg of prednisone on 12/07/21 at 8 AM. Pt is also to take 50 mg of benadryl on 12/07/21 at 8 AM. Please call [phone] with any questions.    Benadryl called in as a separate prescription, per the patients request.

## 2021-12-07 ENCOUNTER — Ambulatory Visit
Admission: RE | Admit: 2021-12-07 | Discharge: 2021-12-07 | Disposition: A | Discharge status: Home / Self Care | Payer: Medicare PPO | Source: Ambulatory Visit | Attending: Neurosurgery | Admitting: Neurosurgery

## 2021-12-07 DIAGNOSIS — M5440 Lumbago with sciatica, unspecified side: Secondary | ICD-10-CM

## 2021-12-07 MED ORDER — METHYLPREDNISOLONE ACETATE 40 MG/ML INJ SUSP (RADIOLOG
80.0000 mg | Freq: Once | INTRAMUSCULAR | Status: AC
  Administered 2021-12-07: 80 mg via EPIDURAL

## 2021-12-07 MED ORDER — IOPAMIDOL (ISOVUE-M 200) INJECTION 41%
1.0000 mL | Freq: Once | INTRAMUSCULAR | Status: AC
  Administered 2021-12-07: 1 mL via EPIDURAL

## 2021-12-07 NOTE — Discharge Instructions (Signed)

## 2021-12-28 ENCOUNTER — Other Ambulatory Visit: Payer: Self-pay | Admitting: Neurosurgery

## 2021-12-28 DIAGNOSIS — M48062 Spinal stenosis, lumbar region with neurogenic claudication: Secondary | ICD-10-CM

## 2022-01-02 ENCOUNTER — Telehealth: Payer: Self-pay

## 2022-01-02 MED ORDER — PREDNISONE 50 MG PO TABS: ORAL_TABLET | ORAL | 0 refills | Status: DC

## 2022-01-02 NOTE — Telephone Encounter (Signed)
Phone call to patient to review instructions for 13 hr prep for NRB w/ contrast on 01/09/22 at 11:30 AM. Prescription called into [facility]. Pt aware and verbalized understanding of instructions.  Prescription: Pt to take 50 mg of prednisone on 01/08/22 at 1030 pm, 50 mg of prednisone on 01/09/22 at 430 am, and 50 mg of prednisone on 01/09/22 at 1030 am. Pt is also to take 50 mg of benadryl on 01/09/22 at 1030 am. Please call [phone] with any questions.   Pt reports she has benadryl at home and did not wish for [place] to call this in as a prescription.

## 2022-01-04 ENCOUNTER — Other Ambulatory Visit: Payer: Medicare PPO

## 2022-01-09 ENCOUNTER — Ambulatory Visit
Admission: RE | Admit: 2022-01-09 | Discharge: 2022-01-09 | Disposition: A | Discharge status: Home / Self Care | Payer: Medicare PPO | Source: Ambulatory Visit | Attending: Neurosurgery | Admitting: Neurosurgery

## 2022-01-09 ENCOUNTER — Other Ambulatory Visit: Payer: Self-pay

## 2022-01-09 DIAGNOSIS — M48062 Spinal stenosis, lumbar region with neurogenic claudication: Secondary | ICD-10-CM

## 2022-01-09 MED ORDER — IOPAMIDOL (ISOVUE-M 200) INJECTION 41%
1.0000 mL | Freq: Once | INTRAMUSCULAR | Status: AC
  Administered 2022-01-09: 1 mL via EPIDURAL

## 2022-01-09 MED ORDER — METHYLPREDNISOLONE ACETATE 40 MG/ML INJ SUSP (RADIOLOG
80.0000 mg | Freq: Once | INTRAMUSCULAR | Status: AC
  Administered 2022-01-09: 80 mg via EPIDURAL

## 2022-01-09 NOTE — Discharge Instructions (Signed)

## 2022-01-11 ENCOUNTER — Other Ambulatory Visit: Payer: Self-pay | Admitting: Physical Medicine and Rehabilitation

## 2022-01-12 ENCOUNTER — Other Ambulatory Visit: Payer: Self-pay | Admitting: Physical Medicine and Rehabilitation

## 2022-03-12 ENCOUNTER — Other Ambulatory Visit: Payer: Self-pay | Admitting: Orthopedic Surgery

## 2022-03-23 ENCOUNTER — Telehealth: Payer: Self-pay

## 2022-03-23 ENCOUNTER — Other Ambulatory Visit: Payer: Self-pay | Admitting: Student

## 2022-03-23 DIAGNOSIS — M5440 Lumbago with sciatica, unspecified side: Secondary | ICD-10-CM

## 2022-03-23 MED ORDER — DIPHENHYDRAMINE HCL 50 MG PO CAPS: 50.0000 mg | ORAL_CAPSULE | Freq: Once | ORAL | Status: AC

## 2022-03-23 MED ORDER — PREDNISONE 50 MG PO TABS: ORAL_TABLET | ORAL | 0 refills | Status: DC

## 2022-03-23 NOTE — Telephone Encounter (Signed)
Pt to take 50 mg of prednisone on 03/27/22 at 0001, 50 mg of prednisone on 03/27/22 at 0600, and 50 mg of prednisone on 03/27/22 at 1200. Pt is also to take 50 mg of benadryl on 03/27/22 at 1200. Please call [phone] with any questions. Verified with pharmacy that they received this order. LMOM for patient with these details.

## 2022-03-25 ENCOUNTER — Other Ambulatory Visit: Payer: Self-pay | Admitting: Internal Medicine

## 2022-03-25 MED ORDER — HYDROXYZINE HCL 10 MG PO TABS: ORAL_TABLET | ORAL | 0 refills | Status: AC

## 2022-03-27 ENCOUNTER — Ambulatory Visit
Admission: RE | Admit: 2022-03-27 | Discharge: 2022-03-27 | Disposition: A | Discharge status: Home / Self Care | Payer: Medicare PPO | Source: Ambulatory Visit | Attending: Student | Admitting: Student

## 2022-03-27 DIAGNOSIS — M5440 Lumbago with sciatica, unspecified side: Secondary | ICD-10-CM

## 2022-03-27 MED ORDER — IOPAMIDOL (ISOVUE-M 200) INJECTION 41%
1.0000 mL | Freq: Once | INTRAMUSCULAR | Status: AC
  Administered 2022-03-27: 1 mL via EPIDURAL

## 2022-03-27 MED ORDER — METHYLPREDNISOLONE ACETATE 40 MG/ML INJ SUSP (RADIOLOG
80.0000 mg | Freq: Once | INTRAMUSCULAR | Status: AC
  Administered 2022-03-27: 80 mg via EPIDURAL

## 2022-03-27 NOTE — Discharge Instructions (Signed)

## 2022-05-01 ENCOUNTER — Ambulatory Visit: Payer: Medicare PPO | Admitting: Physician Assistant

## 2022-05-01 ENCOUNTER — Encounter: Payer: Self-pay | Admitting: Physician Assistant

## 2022-05-01 VITALS — BP 118/68 | HR 61 | Resp 19 | Ht 65.0 in | Wt 217.0 lb

## 2022-05-01 DIAGNOSIS — L299 Pruritus, unspecified: Secondary | ICD-10-CM | POA: Diagnosis not present

## 2022-05-01 DIAGNOSIS — G44209 Tension-type headache, unspecified, not intractable: Secondary | ICD-10-CM

## 2022-05-01 DIAGNOSIS — R140 Abdominal distension (gaseous): Secondary | ICD-10-CM

## 2022-05-15 ENCOUNTER — Other Ambulatory Visit: Payer: Self-pay | Admitting: Physical Medicine and Rehabilitation

## 2022-05-15 ENCOUNTER — Telehealth: Payer: Self-pay | Admitting: Orthopedic Surgery

## 2022-05-15 NOTE — Telephone Encounter (Signed)
Pls advise. Patient not seen since 08/2021. Saw Dr Ernestina Patches 09/2021. Does patient need OV?

## 2022-05-15 NOTE — Telephone Encounter (Signed)
Pt called requesting a referral to be sent to [facility] for injection. Please send referral. Pt phone number is [phone]
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
## 2022-05-16 NOTE — Telephone Encounter (Signed)
IC s/w patient and she states has appt with Dr Saintclair Halsted on 07/19 she will keep appt then.

## 2022-05-18 ENCOUNTER — Ambulatory Visit: Payer: Medicare PPO | Admitting: Orthopedic Surgery

## 2022-05-23 ENCOUNTER — Ambulatory Visit: Payer: Medicare PPO | Admitting: Orthopedic Surgery

## 2022-05-23 ENCOUNTER — Ambulatory Visit (INDEPENDENT_AMBULATORY_CARE_PROVIDER_SITE_OTHER): Payer: Medicare PPO

## 2022-05-23 ENCOUNTER — Ambulatory Visit: Payer: Self-pay

## 2022-05-23 DIAGNOSIS — M25561 Pain in right knee: Secondary | ICD-10-CM

## 2022-05-23 DIAGNOSIS — M25562 Pain in left knee: Secondary | ICD-10-CM

## 2022-05-23 MED ORDER — TRAMADOL HCL 50 MG PO TABS: 50.0000 mg | ORAL_TABLET | Freq: Two times a day (BID) | ORAL | 0 refills | Status: DC | PRN

## 2022-05-26 ENCOUNTER — Encounter: Payer: Self-pay | Admitting: Orthopedic Surgery

## 2022-05-26 NOTE — Progress Notes (Signed)
Office Visit Note   Patient: Wendy Carrillo           Date of Birth: 1941-10-07           MRN: [id number] Visit Date: 05/23/2022 Requested by: Wendy Panda, MD [id number] [place],  [place] [address] PCP: Wendy Panda, MD  Subjective: Chief Complaint  Patient presents with   Other     Bilateral knee pain    HPI: Wendy Carrillo is a 81 year old patient with bilateral knee pain left worse than right.  The pain does radiate down the left leg.  Wakes her from sleep at night.  Ambulates with a cane.  Reports some occasional weakness and giving way.  She states that she feels like there is water running down the left leg.  Describes a feeling of swelling as well.  She does use a cane.  Had prior bilateral knee replacements done 30 years ago.  Having some occasional hip pain on the left-hand side as well.  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  Symptoms are worse with standing.  She had lumbar spine ESI at [facility] last month.  MRI of the lumbar spine in 2020 showed L2-3 L3-4 left greater than right foraminal stenosis which matches with her symptoms              ROS: All systems reviewed are negative as they relate to the chief complaint within the history of present illness.  Patient denies  fevers or chills.   Assessment & Plan: Visit Diagnoses:  1. Pain in both knees, unspecified chronicity     Plan: Impression is left-sided radiculopathy likely stemming from her back.  She does not really want to do any type of back surgery.  The knee looks good on exam and radiographs today.  No indication for any intervention on the left knee at this time.  Tramadol refilled.  Follow-up as needed.  Follow-Up Instructions: Return if symptoms worsen or fail to improve.   Orders:  Orders Placed This Encounter  Procedures   XR Knee 1-2 Views Right   XR Knee 1-2 Views Left   Meds ordered this encounter  Medications   traMADol (ULTRAM) 50 MG tablet     Sig: Take 1 tablet (50 mg total) by mouth every 12 (twelve) hours as needed.    Dispense:  30 tablet    Refill:  0      Procedures: No procedures performed   Clinical Data: No additional findings.  Objective: Vital Signs: There were no vitals taken for this visit.  Physical Exam:   Constitutional: Patient appears well-developed HEENT:  Head: Normocephalic Eyes:EOM are normal Neck: Normal range of motion Cardiovascular: Normal rate Pulmonary/chest: Effort normal Neurologic: Patient is alert Skin: Skin is warm Psychiatric: Patient has normal mood and affect   Ortho Exam: Ortho exam demonstrates no groin pain with internal/external Tatian of the left or right leg.  Mildly positive nerve root tension signs on the left negative on the right.  No effusion in the left knee.  Very good stability to varus valgus stress at 0 30 and 90 degrees.  Extensor mechanism intact.  No calf or quad atrophy left versus right.  Specialty Comments:  No specialty comments available.  Imaging: No results found.   PMFS History: Patient Active Problem List   Diagnosis Date Noted   Spinal stenosis at L4-L5 level 05/18/2019   Benign essential HTN    History of total bilateral  knee replacement (TKR)    Acute blood loss anemia    Post-operative pain    Spinal stenosis of lumbar region 06/09/2018   Spinal stenosis, lumbar    Hypertension    History of duodenal ulcer    Hepatitis    Gastritis    Family history of adverse reaction to anesthesia    Chronic lower back pain    Arthritis    Coronary artery calcification 10/21/2017   Combined forms of age-related cataract of right eye 04/18/2017   Status post total bilateral knee replacement 06/08/2016   Left lumbar radiculopathy 05/09/2016   Bilateral shoulder pain 03/22/2016   DDD (degenerative disc disease), cervical 03/22/2016   Morbid obesity with BMI of 40.0-44.9, adult ([place]) 03/08/2016   Spondylolisthesis, 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/2016    Neck pain 03/08/2016   Lumbar stenosis 03/08/2016   Cervical spinal stenosis 03/08/2016   Back pain 11/16/2015   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/10/2015   Angina pectoris ([place]) 04/20/2015   Morbid obesity ([place]) 04/20/2015   Dyspnea 04/20/2015   Atypical chest pain 04/20/2015   Esophageal reflux 02/23/2015   Essential hypertension 02/23/2015   Chronic back pain 02/23/2015   Chest pain 02/22/2015   Abdominal tenderness, LLQ (left lower quadrant) 06/15/2014   Acute sinusitis 06/15/2014   Allergic rhinitis due to pollen 06/15/2014   Bladder disorder 06/15/2014   Epistaxis 06/15/2014   Female stress incontinence 06/15/2014   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   Insomnia 06/15/2014   Localized primary osteoarthritis of wrist 06/15/2014   Lumbosacral spondylosis 06/15/2014   Osteoarthrosis, localized, primary, hand 06/15/2014   Referred otalgia 06/15/2014   Tingling 06/15/2014   Tinnitus 06/15/2014   Urinary incontinence 06/15/2014   Cystocele 05/04/2014   Retention of urine 05/04/2014   Cystocele, lateral 04/12/2014   Rectocele 03/17/2014   History of blood transfusion 11/05/1990   Past Medical History:  Diagnosis Date   Arthritis    "all over"   Cervicalgia    Chronic low back pain    Chronic lower back pain    Coronary artery calcification    cardiology--- dr t. Oval Linsey---  nuclear study in epic 05-06-2015 no ischedmia, nuclear ef 51%;  cardiac cath 10-23-2017 mild coronary calcification in all 3V without significant obstructive disease   Enlarged lymph node    left goin   GERD (gastroesophageal reflux disease)    History of duodenal ulcer    History of gastritis    History of hepatitis    1960  per pt had finger stick by needle was  in nursing school , does not remember  what type   Hypertension    followed by pcp   Spinal stenosis, lumbar    Ulnar neuropathy of right upper extremity    Wears glasses    Wears partial dentures    upper    Family History  Problem  Relation Age of Onset   Hypertension Mother    Stroke Mother    Diabetes Mother    Heart attack Father    Breast cancer Sister    Leukemia Brother    Breast cancer Sister    Breast cancer Sister    Heart disease Brother     Past Surgical History:  Procedure Laterality Date   ABDOMINAL HERNIA REPAIR  1980s   ANTERIOR AND POSTERIOR VAGINAL REPAIR  2015   W/  MID-URETHRAL SLING AND SACROSPINOUS FIXATION   BACK SURGERY     BREAST CYST EXCISION Bilateral    BREAST SURGERY Left    "  nipple taken off"   CARPAL TUNNEL RELEASE Bilateral right 2010;  left 2012   CATARACT EXTRACTION W/ INTRAOCULAR LENS IMPLANT Left 2014   CATARACT EXTRACTION W/ INTRAOCULAR LENS IMPLANT Bilateral right 2018;  left ?   CHOLECYSTECTOMY OPEN  1980's   AND APPENDECTOMY   COLONOSCOPY W/ BIOPSIES AND POLYPECTOMY  last one 01/ 2017   DILATION AND CURETTAGE OF UTERUS  "several"   HERNIA REPAIR     LEFT HEART CATH AND CORONARY ANGIOGRAPHY N/A 10/23/2017   Procedure: LEFT HEART CATH AND CORONARY ANGIOGRAPHY;  Surgeon: Troy Sine, MD;  Location: [facility];  Service: Cardiovascular;  Laterality: N/A;   LUMBAR LAMINECTOMY/DECOMPRESSION MICRODISCECTOMY Bilateral 06/09/2018   Procedure: Laminectomy and Foraminotomy - Lumbar Two,Lumbar Three - Lumbar One- Two - Lumbar Three-Four- bilateral;  Surgeon: Kary Kos, MD;  Location: [facility];  Service: Neurosurgery;  Laterality: Bilateral;   LUMBAR LAMINECTOMY/DECOMPRESSION MICRODISCECTOMY Bilateral 06/01/2020   Procedure: Laminectomy and Foraminotomy - left - Lumbar Two-Lumbar Three -  - bilateral - Lumbar Four-Lumbar Five;  Surgeon: Kary Kos, MD;  Location: [facility];  Service: Neurosurgery;  Laterality: Bilateral;  Laminectomy and Foraminotomy - left - Lumbar Two-Lumbar Three -  - bilateral - Lumbar Four-Lumbar Five    LYMPH NODE BIOPSY Left 11/28/2020   Procedure: LEFT GROIN LYMPH NODE EXCISIONAL BIOPSY;  Surgeon: Kieth Brightly, Arta Bruce, MD;  Location: [facility];  Service: General;  Laterality: Left;   MULTIPLE TOOTH EXTRACTIONS     PATELLA FRACTURE SURGERY Left 1990s   "after the replacement"   REDUCTION MAMMAPLASTY Bilateral 2004   TOTAL KNEE ARTHROPLASTY Bilateral left 1992;  right 2001   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

## 2022-07-22 ENCOUNTER — Other Ambulatory Visit: Payer: Self-pay | Admitting: Physical Medicine and Rehabilitation

## 2022-10-01 ENCOUNTER — Other Ambulatory Visit: Payer: Self-pay

## 2022-10-01 ENCOUNTER — Emergency Department (HOSPITAL_BASED_OUTPATIENT_CLINIC_OR_DEPARTMENT_OTHER)
Admission: EM | Admit: 2022-10-01 | Discharge: 2022-10-02 | Disposition: A | Discharge status: Home / Self Care | Payer: Medicare PPO | Attending: Emergency Medicine | Admitting: Emergency Medicine

## 2022-10-01 ENCOUNTER — Emergency Department (HOSPITAL_BASED_OUTPATIENT_CLINIC_OR_DEPARTMENT_OTHER): Payer: Medicare PPO

## 2022-10-01 ENCOUNTER — Encounter (HOSPITAL_BASED_OUTPATIENT_CLINIC_OR_DEPARTMENT_OTHER): Payer: Self-pay | Admitting: Emergency Medicine

## 2022-10-01 ENCOUNTER — Emergency Department (HOSPITAL_BASED_OUTPATIENT_CLINIC_OR_DEPARTMENT_OTHER): Payer: Medicare PPO | Admitting: Radiology

## 2022-10-01 DIAGNOSIS — Z96652 Presence of left artificial knee joint: Secondary | ICD-10-CM | POA: Insufficient documentation

## 2022-10-01 DIAGNOSIS — Z79899 Other long term (current) drug therapy: Secondary | ICD-10-CM | POA: Diagnosis not present

## 2022-10-01 DIAGNOSIS — Z7901 Long term (current) use of anticoagulants: Secondary | ICD-10-CM | POA: Diagnosis not present

## 2022-10-01 DIAGNOSIS — I10 Essential (primary) hypertension: Secondary | ICD-10-CM | POA: Insufficient documentation

## 2022-10-01 DIAGNOSIS — Z91040 Latex allergy status: Secondary | ICD-10-CM | POA: Insufficient documentation

## 2022-10-01 DIAGNOSIS — M25562 Pain in left knee: Secondary | ICD-10-CM | POA: Diagnosis present

## 2022-10-01 DIAGNOSIS — I82452 Acute embolism and thrombosis of left peroneal vein: Secondary | ICD-10-CM | POA: Insufficient documentation

## 2022-10-01 LAB — BASIC METABOLIC PANEL
Anion gap: 8 (ref 5–15)
BUN: 13 mg/dL (ref 8–23)
CO2: 29 mmol/L (ref 22–32)
Calcium: 9.1 mg/dL (ref 8.9–10.3)
Chloride: 101 mmol/L (ref 98–111)
Creatinine, Ser: 0.75 mg/dL (ref 0.44–1.00)
GFR, Estimated: >60 mL/min (ref >60)
Glucose, Bld: 110 mg/dL — ABNORMAL HIGH (ref 70–99)
Potassium: 3.7 mmol/L (ref 3.5–5.1)
Sodium: 138 mmol/L (ref 135–145)

## 2022-10-01 MED ORDER — ACETAMINOPHEN 325 MG PO TABS
650.0000 mg | ORAL_TABLET | Freq: Once | ORAL | Status: AC
  Administered 2022-10-01: 650 mg via ORAL
  Filled 2022-10-01: qty 2

## 2022-10-01 MED ORDER — APIXABAN 5 MG PO TABS: 5.0000 mg | ORAL_TABLET | Freq: Every day | ORAL | 1 refills | Status: DC

## 2022-10-01 MED ORDER — APIXABAN (ELIQUIS) EDUCATION KIT FOR DVT/PE PATIENTS: PACK | Freq: Once | Status: AC

## 2022-10-01 MED ORDER — APIXABAN (ELIQUIS) VTE STARTER PACK (10MG AND 5MG): ORAL_TABLET | ORAL | 0 refills | Status: DC
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
## 2022-10-01 NOTE — ED Triage Notes (Signed)
Left knee pain.  Pain behind left knee, some swelling to area tender to touch backside of knee. C/o pain in back of thigh as well.] New, denies injury to area Started saturday

## 2022-10-01 NOTE — ED Notes (Signed)
Pt given ginger ale and graham crackers.

## 2022-10-01 NOTE — Discharge Instructions (Addendum)
Note the visit today was overall consistent with DVT.  Treated with blood thinner called Eliquis for the next 3 months.  I have prescribed all 3 months worse.  Make sure to fill the first starter pack of which will start with 10 mg daily for the first 7 days and then 5 mg thereafter.  See attached information regarding blood thinner and drug interactions.  Please do not hesitate to return to emergency department for worrisome signs and symptoms we discussed become apparent.

## 2022-10-02 ENCOUNTER — Telehealth: Payer: Self-pay | Admitting: Orthopedic Surgery

## 2022-10-02 MED ORDER — APIXABAN 2.5 MG PO TABS
10.0000 mg | ORAL_TABLET | Freq: Once | ORAL | Status: AC
  Administered 2022-10-02: 10 mg via ORAL
  Filled 2022-10-02: qty 4

## 2022-10-02 MED ORDER — APIXABAN 5 MG PO TABS: 5.0000 mg | ORAL_TABLET | Freq: Two times a day (BID) | ORAL | 1 refills | Status: DC

## 2022-10-02 NOTE — Telephone Encounter (Signed)
Pt called requesting a call from [place] states she was advised to go to emergency room due to pt thinking she bussed a blood vessel and she states she need to talk with Ander Purpura F about. Please call pt at [phone].

## 2022-10-04 ENCOUNTER — Emergency Department (HOSPITAL_COMMUNITY): Payer: Medicare PPO

## 2022-10-04 ENCOUNTER — Other Ambulatory Visit: Payer: Self-pay | Admitting: Physical Medicine and Rehabilitation

## 2022-10-04 ENCOUNTER — Encounter (HOSPITAL_COMMUNITY): Payer: Self-pay

## 2022-10-04 ENCOUNTER — Emergency Department (HOSPITAL_COMMUNITY)
Admission: EM | Admit: 2022-10-04 | Discharge: 2022-10-05 | Disposition: A | Discharge status: Home / Self Care | Payer: Medicare PPO | Attending: Emergency Medicine | Admitting: Emergency Medicine

## 2022-10-04 DIAGNOSIS — M545 Low back pain, unspecified: Secondary | ICD-10-CM | POA: Diagnosis not present

## 2022-10-04 DIAGNOSIS — S79911A Unspecified injury of right hip, initial encounter: Secondary | ICD-10-CM | POA: Diagnosis present

## 2022-10-04 DIAGNOSIS — Z7901 Long term (current) use of anticoagulants: Secondary | ICD-10-CM | POA: Diagnosis not present

## 2022-10-04 DIAGNOSIS — S7001XA Contusion of right hip, initial encounter: Secondary | ICD-10-CM | POA: Diagnosis not present

## 2022-10-04 DIAGNOSIS — W19XXXA Unspecified fall, initial encounter: Secondary | ICD-10-CM

## 2022-10-04 DIAGNOSIS — Z91040 Latex allergy status: Secondary | ICD-10-CM | POA: Insufficient documentation

## 2022-10-04 DIAGNOSIS — R519 Headache, unspecified: Secondary | ICD-10-CM | POA: Insufficient documentation

## 2022-10-04 DIAGNOSIS — N3000 Acute cystitis without hematuria: Secondary | ICD-10-CM | POA: Insufficient documentation

## 2022-10-04 DIAGNOSIS — W06XXXA Fall from bed, initial encounter: Secondary | ICD-10-CM | POA: Diagnosis not present

## 2022-10-04 LAB — COMPREHENSIVE METABOLIC PANEL
ALT: 11 U/L (ref 0–44)
AST: 16 U/L (ref 15–41)
Albumin: 3.5 g/dL (ref 3.5–5.0)
Alkaline Phosphatase: 49 U/L (ref 38–126)
Anion gap: 6 (ref 5–15)
BUN: 12 mg/dL (ref 8–23)
CO2: 28 mmol/L (ref 22–32)
Calcium: 9.1 mg/dL (ref 8.9–10.3)
Chloride: 104 mmol/L (ref 98–111)
Creatinine, Ser: 1.03 mg/dL — ABNORMAL HIGH (ref 0.44–1.00)
GFR, Estimated: 54 mL/min — ABNORMAL LOW (ref >60)
Glucose, Bld: 103 mg/dL — ABNORMAL HIGH (ref 70–99)
Potassium: 3.9 mmol/L (ref 3.5–5.1)
Sodium: 138 mmol/L (ref 135–145)
Total Bilirubin: 0.4 mg/dL (ref 0.3–1.2)
Total Protein: 6.3 g/dL — ABNORMAL LOW (ref 6.5–8.1)

## 2022-10-04 LAB — CBC
HCT: 41.4 % (ref 36.0–46.0)
Hemoglobin: 13.2 g/dL (ref 12.0–15.0)
MCH: 30 pg (ref 26.0–34.0)
MCHC: 31.9 g/dL (ref 30.0–36.0)
MCV: 94.1 fL (ref 80.0–100.0)
Platelets: 250 10*3/uL (ref 150–400)
RBC: 4.4 MIL/uL (ref 3.87–5.11)
RDW: 13.6 % (ref 11.5–15.5)
WBC: 7.1 10*3/uL (ref 4.0–10.5)
nRBC: 0 % (ref 0.0–0.2)

## 2022-10-04 NOTE — ED Triage Notes (Signed)
Pt comes from home via [facility] after sliding out of bed and fell on R hip, pt is on eliquis but did not hit head.

## 2022-10-04 NOTE — ED Provider Triage Note (Signed)
Emergency Medicine Provider Triage Evaluation Note  Wendy Carrillo , a 81 y.o. female  was evaluated in triage.  Pt complains of Sliding out of her bed earlier just before arrival. States she felt more weak today. States she has been feeling more tired lately. Is on DOAC for DVT. States she slid out of bed and fell to ground no head injury or LOC. Low back pain and R hip pain.  Review of Systems  Positive: Low back and r hip pain Negative: Fever   Physical Exam  BP 124/71   Pulse 70   Temp 97.7 F (36.5 C)   Resp 18   SpO2 100%  Gen:   Awake, no distress   Resp:  Normal effort  MSK:   Moves extremities without difficulty  Other:  TTP R hip, TTP L spine  Medical Decision Making  Medically screening exam initiated at 8:53 PM.  Appropriate orders placed.  DANICE DIPPOLITO was informed that the remainder of the evaluation will be completed by another provider, this initial triage assessment does not replace that evaluation, and the importance of remaining in the ED until their evaluation is complete.  CT L,C head DG hip R    Tedd Sias, [place] 10/04/22 2055

## 2022-10-04 NOTE — Telephone Encounter (Signed)
I called and talked with patient. She said that she went to ER and was diagnosed with DVT. Is on blood thinner medication. She wanted to let you know.

## 2022-10-05 LAB — URINALYSIS, ROUTINE W REFLEX MICROSCOPIC
Bilirubin Urine: NEGATIVE
Glucose, UA: NEGATIVE mg/dL
Hgb urine dipstick: NEGATIVE
Ketones, ur: NEGATIVE mg/dL
Nitrite: NEGATIVE
Protein, ur: NEGATIVE mg/dL
Specific Gravity, Urine: 1.003 — ABNORMAL LOW (ref 1.005–1.030)
pH: 5 (ref 5.0–8.0)

## 2022-10-05 MED ORDER — ONDANSETRON HCL 4 MG/2ML IJ SOLN
4.0000 mg | Freq: Once | INTRAMUSCULAR | Status: AC
  Administered 2022-10-05: 4 mg via INTRAVENOUS
  Filled 2022-10-05: qty 2

## 2022-10-05 MED ORDER — OXYCODONE-ACETAMINOPHEN 5-325 MG PO TABS: 0.5000 | ORAL_TABLET | ORAL | 0 refills | Status: DC | PRN

## 2022-10-05 MED ORDER — FOSFOMYCIN TROMETHAMINE 3 G PO PACK
3.0000 g | PACK | Freq: Once | ORAL | Status: AC
  Administered 2022-10-05: 3 g via ORAL
  Filled 2022-10-05: qty 3

## 2022-10-05 MED ORDER — MORPHINE SULFATE (PF) 2 MG/ML IV SOLN
2.0000 mg | Freq: Once | INTRAVENOUS | Status: AC
  Administered 2022-10-05: 2 mg via INTRAVENOUS
  Filled 2022-10-05: qty 1

## 2022-10-05 NOTE — ED Provider Notes (Signed)
[facility] EMERGENCY DEPARTMENT Provider Note   CSN: [id number] Arrival date & time: 10/04/22  2039     History  Chief Complaint  Patient presents with   Wendy Carrillo    LORRIN BODNER is a 81 y.o. female.  Patient presents to the emergency for evaluation of fall.  Patient reports that she slid out of her bed earlier tonight.  She is not sure exactly how she fell.  She reports that she has been feeling somewhat weak today.  She was recently started on Eliquis secondary to a DVT in her leg.  Patient complaining of pain in the lower back and her right hip area from the impact.       Home Medications Prior to Admission medications   Medication Sig Start Date End Date Taking? Authorizing Provider  oxyCODONE-acetaminophen (PERCOCET) 5-325 MG tablet Take 0.5-1 tablets by mouth every 4 (four) hours as needed. 10/05/22  Yes Naamah Boggess, Gwenyth Allegra, MD  ALPRAZolam Duanne Moron) 0.25 MG tablet Take 1 tablet (0.25 mg total) by mouth daily as needed. Patient taking differently: Take 0.25 mg by mouth daily as needed for anxiety. 02/24/15   Rai, Vernelle Emerald, MD  apixaban (ELIQUIS) 5 MG TABS tablet Take 1 tablet (5 mg total) by mouth 2 (two) times daily. 10/02/22   Wilnette Kales, PA  APIXABAN Arne Cleveland) VTE STARTER PACK ('10MG'$  AND '5MG'$ ) Take as directed on package: start with two-'5mg'$  tablets twice daily for 7 days. On day 8, switch to one-'5mg'$  tablet twice daily. 10/01/22   Wilnette Kales, PA  diphenhydrAMINE (BENADRYL) 50 MG tablet Take 1 tablet (50 mg total) by mouth once for 1 dose. Pt to take 50 mg of benadryl on 12/07/21 at 8 AM. Please call [phone] with any questions. 12/04/21 12/04/21  Arne Cleveland, MD  furosemide (LASIX) 20 MG tablet Take 20 mg by mouth daily as needed for fluid.  Patient not taking: Reported on 05/01/2022    [provider]  gabapentin (NEURONTIN) 300 MG capsule TAKE 2 CAPSULES BY MOUTH THREE TIMES PER DAY. CAN SLOWLY INCREASE FROM CURRENT  PRESCRIPTION. 07/24/22   Lorine Bears, NP  hydrOXYzine (ATARAX) 10 MG tablet One tab every 12 hours as needed for itching 03/25/22   Glendale Chard, MD  loratadine (CLARITIN) 10 MG tablet Take 10 mg by mouth daily. In the morning    [provider]  meclizine (ANTIVERT) 25 MG tablet Take 25 mg by mouth 3 (three) times daily as needed for dizziness.  Patient not taking: Reported on 05/01/2022 12/02/19   [provider]  methocarbamol (ROBAXIN) 500 MG tablet TAKE 1 TABLET BY MOUTH EVERY 8 HOURS AS NEEDED FOR MUSCLE SPASMS. 03/13/22   Magnant, Charles L, PA-C  Multiple Vitamins-Minerals (AIRBORNE GUMMIES PO) Take by mouth.    [provider]  pantoprazole (PROTONIX) 20 MG tablet Take 1 tablet (20 mg total) by mouth daily. Patient not taking: Reported on 05/01/2022 08/27/16   Harlin Heys, MD  predniSONE (DELTASONE) 20 MG tablet 3 Tabs PO Days 1-3, then 2 tabs PO Days 4-6, then 1 tab PO Day 7-9, then Half Tab PO Day 10-12 Patient not taking: Reported on 05/01/2022 06/19/21   Carlisle Cater, PA-C  predniSONE (DELTASONE) 50 MG tablet Pt to take 50 mg of prednisone on 12/06/21 at 8 PM, 50 mg of prednisone on 12/07/21 at 2 AM, and 50 mg of prednisone on 12/07/21 at 8 AM. Pt is also to take 50 mg of benadryl on 12/07/21 at 8  AM. Please call [phone] with any questions. Patient not taking: Reported on 05/01/2022 12/04/21   Arne Cleveland, MD  predniSONE (DELTASONE) 50 MG tablet Pt to take 50 mg of prednisone on 01/08/22 at 1030 pm, 50 mg of prednisone on 01/09/22 at 430 am, and 50 mg of prednisone on 01/09/22 at 1030 am. Pt is also to take 50 mg of benadryl on 01/09/22 at 1030 am. Please call [phone] with any questions. Patient not taking: Reported on 05/01/2022 01/02/22   Criselda Peaches, MD  predniSONE (DELTASONE) 50 MG tablet Pt to take 50 mg of prednisone on 03/27/22 at 0001, 50 mg of prednisone on 03/27/22 at 0600, and 50 mg of prednisone on 03/27/22 at 1200. Pt is also to take 50 mg of  benadryl on 03/27/22 at 1200. Please call [phone] with any questions. Patient not taking: Reported on 05/01/2022 03/23/22   [name], MD  ramipril (ALTACE) 2.5 MG capsule Take 2.5 mg by mouth daily. 02/17/15   [provider]  sucralfate (CARAFATE) 1 g tablet Take 1 g by mouth 4 (four) times daily. 05/16/21   [provider]  traMADol (ULTRAM) 50 MG tablet Take 1 tablet (50 mg total) by mouth every 12 (twelve) hours as needed. 05/23/22   Meredith Pel, MD  Vitamin D, Ergocalciferol, (DRISDOL) 50000 UNITS CAPS capsule Take 50,000 Units by mouth every Thursday.    [provider]      Allergies    Contrast media [iodinated contrast media], Penicillins, Baclofen, Latex, and Other    Review of Systems   Review of Systems  Physical Exam Updated Vital Signs BP 135/72 (BP Location: Left Arm)   Pulse 72   Temp 98 F (36.7 C) (Oral)   Resp 16   SpO2 96%  Physical Exam Vitals and nursing note reviewed.  Constitutional:      General: She is not in acute distress.    Appearance: She is well-developed.  HENT:     Head: Normocephalic and atraumatic.     Mouth/Throat:     Mouth: Mucous membranes are moist.  Eyes:     General: Vision grossly intact. Gaze aligned appropriately.     Extraocular Movements: Extraocular movements intact.     Conjunctiva/sclera: Conjunctivae normal.  Cardiovascular:     Rate and Rhythm: Normal rate and regular rhythm.     Pulses: Normal pulses.     Heart sounds: Normal heart sounds, S1 normal and S2 normal. No murmur heard.    No friction rub. No gallop.  Pulmonary:     Effort: Pulmonary effort is normal. No respiratory distress.     Breath sounds: Normal breath sounds.  Abdominal:     General: Bowel sounds are normal.     Palpations: Abdomen is soft.     Tenderness: There is no abdominal tenderness. There is no guarding or rebound.     Hernia: No hernia is present.  Musculoskeletal:        General: No swelling.      Cervical back: Full passive range of motion without pain, normal range of motion and neck supple. No spinous process tenderness or muscular tenderness. Normal range of motion.     Lumbar back: Tenderness present. Negative right straight leg raise test and negative left straight leg raise test.     Right hip: Tenderness present. No deformity.     Right lower leg: No edema.     Left lower leg: No edema.  Skin:    General: Skin  is warm and dry.     Capillary Refill: Capillary refill takes less than 2 seconds.     Findings: No ecchymosis, erythema, rash or wound.  Neurological:     General: No focal deficit present.     Mental Status: She is alert and oriented to person, place, and time.     GCS: GCS eye subscore is 4. GCS verbal subscore is 5. GCS motor subscore is 6.     Cranial Nerves: Cranial nerves 2-12 are intact.     Sensory: Sensation is intact.     Motor: Motor function is intact.     Coordination: Coordination is intact.  Psychiatric:        Attention and Perception: Attention normal.        Mood and Affect: Mood normal.        Speech: Speech normal.        Behavior: Behavior normal.     ED Results / Procedures / Treatments   Labs (all labs ordered are listed, but only abnormal results are displayed) Labs Reviewed  COMPREHENSIVE METABOLIC PANEL - Abnormal; Notable for the following components:      Result Value   Glucose, Bld 103 (*)    Creatinine, Ser 1.03 (*)    Total Protein 6.3 (*)    GFR, Estimated 54 (*)    All other components within normal limits  URINALYSIS, ROUTINE W REFLEX MICROSCOPIC - Abnormal; Notable for the following components:   Color, Urine STRAW (*)    Specific Gravity, Urine 1.003 (*)    Leukocytes,Ua LARGE (*)    Bacteria, UA RARE (*)    All other components within normal limits  CBC    EKG EKG Interpretation  Date/Time:  Thursday October 04 2022 20:52:05 EST Ventricular Rate:  69 PR Interval:  148 QRS Duration: 78 QT  Interval:  372 QTC Calculation: 398 R Axis:   -31 Text Interpretation: Normal sinus rhythm Left axis deviation Minimal voltage criteria for LVH, may be normal variant ( R in aVL ) Nonspecific ST and T wave abnormality Abnormal ECG When compared with ECG of 11-Nov-2021 15:14, PREVIOUS ECG IS PRESENT Confirmed by Orpah Greek [phone]) on 10/05/2022 12:54:13 AM  Radiology CT Lumbar Spine Wo Contrast  Result Date: 10/04/2022 CLINICAL DATA:  Trauma and low back pain EXAM: CT LUMBAR SPINE WITHOUT CONTRAST TECHNIQUE: Multidetector CT imaging of the lumbar spine was performed without intravenous contrast administration. Multiplanar CT image reconstructions were also generated. RADIATION DOSE REDUCTION: This exam was performed according to the departmental dose-optimization program which includes automated exposure control, adjustment of the mA and/or kV according to patient size and/or use of iterative reconstruction technique. COMPARISON:  None Available. FINDINGS: Segmentation: 5 lumbar type vertebrae. Alignment: Grade 1 retrolisthesis at L1-2 and L2-3. Grade 1 anterolisthesis at L4-5. Vertebrae: No acute fracture or focal pathologic process. Opposing endplate sclerosis at N0-2 Paraspinal and other soft tissues: Negative Disc levels: At L2-3 there is moderate spinal canal stenosis due to endplate spurring and facet hypertrophy. There is severe narrowing of both neural foramina. At L3-4, there is severe right and moderate left foraminal stenosis due to facet hypertrophy. At L4-5, there is severe facet arthrosis with mild foraminal narrowing on the right. At L5-S1, there is severe bilateral neural foraminal stenosis due to endplate spurring and facet hypertrophy IMPRESSION: 1. No acute fracture of the lumbar spine. 2. 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 3. Severe right and moderate left foraminal  stenosis at L3-4. 4. Severe bilateral neural foraminal stenosis  at L5-S1. Electronically Signed   By: Ulyses Jarred M.D.   On: 10/04/2022 23:02   CT HEAD WO CONTRAST (5MM)  Result Date: 10/04/2022 CLINICAL DATA:  Recent fall from bed with headaches and neck pain, initial encounter EXAM: CT HEAD WITHOUT CONTRAST CT CERVICAL SPINE WITHOUT CONTRAST TECHNIQUE: Multidetector CT imaging of the head and cervical spine was performed following the standard protocol without intravenous contrast. Multiplanar CT image reconstructions of the cervical spine were also generated. RADIATION DOSE REDUCTION: This exam was performed according to the departmental dose-optimization program which includes automated exposure control, adjustment of the mA and/or kV according to patient size and/or use of iterative reconstruction technique. COMPARISON:  None Available. FINDINGS: CT HEAD FINDINGS Brain: No evidence of acute infarction, hemorrhage, hydrocephalus, extra-axial collection or mass lesion/mass effect. Vascular: No hyperdense vessel or unexpected calcification. Skull: Normal. Negative for fracture or focal lesion. Sinuses/Orbits: Mucosal thickening is noted within the right maxillary antrum Other: None. CT CERVICAL SPINE FINDINGS Alignment: Within normal limits. Skull base and vertebrae: 7 cervical segments are well visualized. Vertebral body height is well maintained. Multilevel disc space narrowing and osteophytic changes are seen. Facet hypertrophic changes are noted. No acute fracture or acute facet abnormality is noted. The odontoid is within normal limits. Soft tissues and spinal canal: Surrounding soft tissue structures are within normal limits. Upper chest: Visualized lung apices are unremarkable. Other: None IMPRESSION: CT of the head: No acute intracranial abnormality noted. Mucosal thickening in the right maxillary antrum. CT of the cervical spine: Multilevel degenerative change without acute abnormality. Electronically Signed   By: Inez Catalina M.D.   On: 10/04/2022 22:50   CT  Cervical Spine Wo Contrast  Result Date: 10/04/2022 CLINICAL DATA:  Recent fall from bed with headaches and neck pain, initial encounter EXAM: CT HEAD WITHOUT CONTRAST CT CERVICAL SPINE WITHOUT CONTRAST TECHNIQUE: Multidetector CT imaging of the head and cervical spine was performed following the standard protocol without intravenous contrast. Multiplanar CT image reconstructions of the cervical spine were also generated. RADIATION DOSE REDUCTION: This exam was performed according to the departmental dose-optimization program which includes automated exposure control, adjustment of the mA and/or kV according to patient size and/or use of iterative reconstruction technique. COMPARISON:  None Available. FINDINGS: CT HEAD FINDINGS Brain: No evidence of acute infarction, hemorrhage, hydrocephalus, extra-axial collection or mass lesion/mass effect. Vascular: No hyperdense vessel or unexpected calcification. Skull: Normal. Negative for fracture or focal lesion. Sinuses/Orbits: Mucosal thickening is noted within the right maxillary antrum Other: None. CT CERVICAL SPINE FINDINGS Alignment: Within normal limits. Skull base and vertebrae: 7 cervical segments are well visualized. Vertebral body height is well maintained. Multilevel disc space narrowing and osteophytic changes are seen. Facet hypertrophic changes are noted. No acute fracture or acute facet abnormality is noted. The odontoid is within normal limits. Soft tissues and spinal canal: Surrounding soft tissue structures are within normal limits. Upper chest: Visualized lung apices are unremarkable. Other: None IMPRESSION: CT of the head: No acute intracranial abnormality noted. Mucosal thickening in the right maxillary antrum. CT of the cervical spine: Multilevel degenerative change without acute abnormality. Electronically Signed   By: Inez Catalina M.D.   On: 10/04/2022 22:50   DG Hip Unilat W or Wo Pelvis 2-3 Views Right  Result Date: 10/04/2022 CLINICAL  DATA:  [place], right hip pain EXAM: DG HIP (WITH OR WITHOUT PELVIS) 2-3V RIGHT COMPARISON:  None Available. FINDINGS: Frontal view of the pelvis  as well as a frogleg lateral view of the right hip are obtained on 2 images. There are no acute displaced fractures. There is bilateral hip osteoarthritis, left greater than right. Soft tissues are unremarkable. Sacroiliac joints are normal. IMPRESSION: 1. Bilateral hip osteoarthritis, left greater than right. 2. No acute displaced fracture. Electronically Signed   By: Randa Ngo M.D.   On: 10/04/2022 21:36    Procedures Procedures    Medications Ordered in ED Medications  morphine (PF) 2 MG/ML injection 2 mg (2 mg Intravenous Given 10/05/22 0230)  ondansetron (ZOFRAN) injection 4 mg (4 mg Intravenous Given 10/05/22 0144)  morphine (PF) 2 MG/ML injection 2 mg (2 mg Intravenous Given 10/05/22 0536)  fosfomycin (MONUROL) packet 3 g (3 g Oral Given 10/05/22 6962)    ED Course/ Medical Decision Making/ A&P                           Medical Decision Making Risk Prescription drug management.   Presents to the emergency department for evaluation after a fall.  Patient slid out of her bed earlier tonight, cannot explain exactly what happened.  She is on Eliquis secondary to DVT.  She does not think she hit her head, no neurologic deficits on exam.  CT head performed because of the blood thinners.  No acute injury noted.  Patient complaining of diffuse low back pain and right hip pain.  CT lumbar spine, x-ray hip negative.  Patient treated with analgesia and improvement.  She has been ambulatory here in the department.  Blood work unremarkable, urinalysis suspicious for infection.  Treated with fosfomycin.  Follow-up with PCP, provided analgesia.        Final Clinical Impression(s) / ED Diagnoses Final diagnoses:  Fall, initial encounter  Contusion of hip and thigh, right, initial encounter  Acute cystitis without hematuria    Rx / DC Orders ED  Discharge Orders          Ordered    oxyCODONE-acetaminophen (PERCOCET) 5-325 MG tablet  Every 4 hours PRN        10/05/22 0628              Orpah Greek, MD 10/05/22 [phone]

## 2022-10-09 NOTE — Telephone Encounter (Signed)
Okay; thanks.

## 2022-10-10 ENCOUNTER — Ambulatory Visit: Payer: Self-pay | Admitting: *Deleted

## 2022-10-10 ENCOUNTER — Emergency Department (HOSPITAL_COMMUNITY): Payer: Medicare PPO

## 2022-10-10 ENCOUNTER — Observation Stay (HOSPITAL_COMMUNITY)
Admission: EM | Admit: 2022-10-10 | Discharge: 2022-10-15 | Disposition: A | Discharge status: Skilled Nursing Facility | Payer: Medicare PPO | Attending: Family Medicine | Admitting: Family Medicine

## 2022-10-10 ENCOUNTER — Other Ambulatory Visit: Payer: Self-pay

## 2022-10-10 DIAGNOSIS — E559 Vitamin D deficiency, unspecified: Secondary | ICD-10-CM | POA: Insufficient documentation

## 2022-10-10 DIAGNOSIS — Z96653 Presence of artificial knee joint, bilateral: Secondary | ICD-10-CM | POA: Diagnosis not present

## 2022-10-10 DIAGNOSIS — R0781 Pleurodynia: Secondary | ICD-10-CM | POA: Diagnosis present

## 2022-10-10 DIAGNOSIS — Z6837 Body mass index (BMI) 37.0-37.9, adult: Secondary | ICD-10-CM | POA: Insufficient documentation

## 2022-10-10 DIAGNOSIS — I10 Essential (primary) hypertension: Secondary | ICD-10-CM | POA: Diagnosis not present

## 2022-10-10 DIAGNOSIS — R770 Abnormality of albumin: Secondary | ICD-10-CM | POA: Diagnosis not present

## 2022-10-10 DIAGNOSIS — R531 Weakness: Secondary | ICD-10-CM | POA: Insufficient documentation

## 2022-10-10 DIAGNOSIS — I82402 Acute embolism and thrombosis of unspecified deep veins of left lower extremity: Secondary | ICD-10-CM | POA: Diagnosis not present

## 2022-10-10 DIAGNOSIS — R0981 Nasal congestion: Secondary | ICD-10-CM | POA: Insufficient documentation

## 2022-10-10 DIAGNOSIS — I959 Hypotension, unspecified: Secondary | ICD-10-CM | POA: Insufficient documentation

## 2022-10-10 DIAGNOSIS — R8281 Pyuria: Secondary | ICD-10-CM | POA: Insufficient documentation

## 2022-10-10 DIAGNOSIS — Z7901 Long term (current) use of anticoagulants: Secondary | ICD-10-CM | POA: Insufficient documentation

## 2022-10-10 DIAGNOSIS — Z79899 Other long term (current) drug therapy: Secondary | ICD-10-CM | POA: Diagnosis not present

## 2022-10-10 DIAGNOSIS — Z91040 Latex allergy status: Secondary | ICD-10-CM | POA: Diagnosis not present

## 2022-10-10 DIAGNOSIS — R7303 Prediabetes: Secondary | ICD-10-CM | POA: Insufficient documentation

## 2022-10-10 DIAGNOSIS — E538 Deficiency of other specified B group vitamins: Secondary | ICD-10-CM | POA: Diagnosis not present

## 2022-10-10 DIAGNOSIS — Z86718 Personal history of other venous thrombosis and embolism: Secondary | ICD-10-CM | POA: Diagnosis not present

## 2022-10-10 DIAGNOSIS — R4182 Altered mental status, unspecified: Secondary | ICD-10-CM | POA: Diagnosis not present

## 2022-10-10 DIAGNOSIS — B965 Pseudomonas (aeruginosa) (mallei) (pseudomallei) as the cause of diseases classified elsewhere: Secondary | ICD-10-CM | POA: Insufficient documentation

## 2022-10-10 DIAGNOSIS — R946 Abnormal results of thyroid function studies: Secondary | ICD-10-CM | POA: Diagnosis not present

## 2022-10-10 DIAGNOSIS — E669 Obesity, unspecified: Secondary | ICD-10-CM | POA: Diagnosis not present

## 2022-10-10 DIAGNOSIS — Z1152 Encounter for screening for COVID-19: Secondary | ICD-10-CM | POA: Insufficient documentation

## 2022-10-10 LAB — COMPREHENSIVE METABOLIC PANEL
ALT: 11 U/L (ref 0–44)
AST: 19 U/L (ref 15–41)
Albumin: 3.1 g/dL — ABNORMAL LOW (ref 3.5–5.0)
Alkaline Phosphatase: 46 U/L (ref 38–126)
Anion gap: 9 (ref 5–15)
BUN: 12 mg/dL (ref 8–23)
CO2: 25 mmol/L (ref 22–32)
Calcium: 8.5 mg/dL — ABNORMAL LOW (ref 8.9–10.3)
Chloride: 105 mmol/L (ref 98–111)
Creatinine, Ser: 0.98 mg/dL (ref 0.44–1.00)
GFR, Estimated: 58 mL/min — ABNORMAL LOW (ref >60)
Glucose, Bld: 117 mg/dL — ABNORMAL HIGH (ref 70–99)
Potassium: 4.1 mmol/L (ref 3.5–5.1)
Sodium: 139 mmol/L (ref 135–145)
Total Bilirubin: 0.5 mg/dL (ref 0.3–1.2)
Total Protein: 5.5 g/dL — ABNORMAL LOW (ref 6.5–8.1)

## 2022-10-10 LAB — CBC WITH DIFFERENTIAL/PLATELET
Abs Immature Granulocytes: 0.03 10*3/uL (ref 0.00–0.07)
Basophils Absolute: 0 10*3/uL (ref 0.0–0.1)
Basophils Relative: 0 %
Eosinophils Absolute: 0.2 10*3/uL (ref 0.0–0.5)
Eosinophils Relative: 2 %
HCT: 40.6 % (ref 36.0–46.0)
Hemoglobin: 13.3 g/dL (ref 12.0–15.0)
Immature Granulocytes: 0 %
Lymphocytes Relative: 29 %
Lymphs Abs: 2.6 10*3/uL (ref 0.7–4.0)
MCH: 30.4 pg (ref 26.0–34.0)
MCHC: 32.8 g/dL (ref 30.0–36.0)
MCV: 92.9 fL (ref 80.0–100.0)
Monocytes Absolute: 0.9 10*3/uL (ref 0.1–1.0)
Monocytes Relative: 11 %
Neutro Abs: 5 10*3/uL (ref 1.7–7.7)
Neutrophils Relative %: 58 %
Platelets: 244 10*3/uL (ref 150–400)
RBC: 4.37 MIL/uL (ref 3.87–5.11)
RDW: 13.8 % (ref 11.5–15.5)
WBC: 8.7 10*3/uL (ref 4.0–10.5)
nRBC: 0 % (ref 0.0–0.2)

## 2022-10-10 LAB — I-STAT CHEM 8, ED
BUN: 13 mg/dL (ref 8–23)
Calcium, Ion: 1.07 mmol/L — ABNORMAL LOW (ref 1.15–1.40)
Chloride: 102 mmol/L (ref 98–111)
Creatinine, Ser: 1 mg/dL (ref 0.44–1.00)
Glucose, Bld: 110 mg/dL — ABNORMAL HIGH (ref 70–99)
HCT: 40 % (ref 36.0–46.0)
Hemoglobin: 13.6 g/dL (ref 12.0–15.0)
Potassium: 4.1 mmol/L (ref 3.5–5.1)
Sodium: 137 mmol/L (ref 135–145)
TCO2: 29 mmol/L (ref 22–32)

## 2022-10-10 LAB — RESP PANEL BY RT-PCR (FLU A&B, COVID) ARPGX2
Influenza A by PCR: NEGATIVE
Influenza B by PCR: NEGATIVE
SARS Coronavirus 2 by RT PCR: NEGATIVE

## 2022-10-10 LAB — BRAIN NATRIURETIC PEPTIDE: B Natriuretic Peptide: 21.7 pg/mL (ref 0.0–100.0)

## 2022-10-10 LAB — TROPONIN I (HIGH SENSITIVITY): Troponin I (High Sensitivity): 7 ng/L (ref <18)

## 2022-10-10 MED ORDER — DIPHENHYDRAMINE HCL 50 MG/ML IJ SOLN: 50.0000 mg | Freq: Once | INTRAMUSCULAR | Status: AC

## 2022-10-10 MED ORDER — METHYLPREDNISOLONE SODIUM SUCC 40 MG IJ SOLR
40.0000 mg | Freq: Once | INTRAMUSCULAR | Status: AC
  Administered 2022-10-11: 40 mg via INTRAVENOUS
  Filled 2022-10-10: qty 1

## 2022-10-10 MED ORDER — DIPHENHYDRAMINE HCL 50 MG/ML IJ SOLN: 50.0000 mg | Freq: Once | INTRAMUSCULAR | Status: DC

## 2022-10-10 MED ORDER — METHYLPREDNISOLONE SODIUM SUCC 40 MG IJ SOLR: 40.0000 mg | Freq: Once | INTRAMUSCULAR | Status: DC

## 2022-10-10 MED ORDER — SODIUM CHLORIDE 0.9 % IV BOLUS
500.0000 mL | Freq: Once | INTRAVENOUS | Status: AC
  Administered 2022-10-10: 500 mL via INTRAVENOUS

## 2022-10-10 MED ORDER — DIPHENHYDRAMINE HCL 25 MG PO CAPS
50.0000 mg | ORAL_CAPSULE | Freq: Once | ORAL | Status: AC
  Administered 2022-10-11: 50 mg via ORAL
  Filled 2022-10-10: qty 2

## 2022-10-10 MED ORDER — METHYLPREDNISOLONE SODIUM SUCC 40 MG IJ SOLR
40.0000 mg | Freq: Once | INTRAMUSCULAR | Status: AC
  Administered 2022-10-10: 40 mg via INTRAVENOUS
  Filled 2022-10-10: qty 1

## 2022-10-10 MED ORDER — MORPHINE SULFATE (PF) 4 MG/ML IV SOLN
4.0000 mg | Freq: Once | INTRAVENOUS | Status: AC
  Administered 2022-10-10: 4 mg via INTRAVENOUS
  Filled 2022-10-10: qty 1

## 2022-10-10 MED ORDER — DIPHENHYDRAMINE HCL 25 MG PO CAPS: 50.0000 mg | ORAL_CAPSULE | Freq: Once | ORAL | Status: DC

## 2022-10-10 NOTE — Telephone Encounter (Signed)
  Chief Complaint: Weakness Symptoms: "Out of it" Frequency: Tonight, hour ago Pertinent Negatives: Patient denies  Disposition: '[x]'$ ED /'[]'$ Urgent Care (no appt availability in office) / '[]'$ Appointment(In office/virtual)/ '[]'$  [place] [facility]/ '[]'$ Home Care/ '[]'$ Refused Recommended Disposition /'[]'$ [place] [facility]/ '[]'$  Follow-up with PCP Additional Notes: Son states "[facility] is very different tonight. States Generalizes weakness, then left sided weakness.." States took gabapentin, severe left pain. Advised ED. States will follow disposition. Reason for Disposition  Patient sounds very sick or weak to the triager  Answer Assessment - Initial Assessment Questions 1. DESCRIPTION: "Describe how you are feeling."     Lethargic 2. SEVERITY: "How bad is it?"  "Can you stand and walk?"   - MILD (0-3): Feels weak or tired, but does not interfere with work, school or normal activities.   - MODERATE (4-7): Able to stand and walk; weakness interferes with work, school, or normal activities.   - SEVERE (8-10): Unable to stand or walk; unable to do usual activities.     Moderate 3. ONSET: "When did these symptoms begin?" (e.g., hours, days, weeks, months)      4. CAUSE: "What do you think is causing the weakness or fatigue?" (e.g., not drinking enough fluids, medical problem, trouble sleeping)     *No Answer* 5. NEW MEDICINES:  "Have you started on any new medicines recently?" (e.g., opioid pain medicines, benzodiazepines, muscle relaxants, antidepressants, antihistamines, neuroleptics, beta blockers)     *No Answer* 6. OTHER SYMPTOMS: "Do you have any other symptoms?" (e.g., chest pain, fever, cough, SOB, vomiting, diarrhea, bleeding, other areas of pain)     Leg pain  Protocols used: Weakness (Generalized) and Fatigue-A-AH

## 2022-10-10 NOTE — ED Provider Notes (Signed)
[facility] Provider Note   CSN: [id number] Arrival date & time: 10/10/22  2005     History {Add pertinent medical, surgical, social history, OB history to HPI:1} Chief Complaint  Patient presents with  . Chest Pain    Wendy Carrillo is a 81 y.o. female.  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  She was diagnosed with a DVT of the left lower extremity on 10/01/2022.  Has been taking her Eliquis as prescribed.  Had a fall 3 days later and was diagnosed with a contusion of the right lower extremity and cystitis.  She states that she has "not felt like myself since starting Eliquis."  She was ambulatory at baseline until approximately 5 PM tonight when she developed profound weakness.  She states that the pain below her left breast has been present for the past 4 days but got worse today.  Pain is slightly worse with deep inspiration.  She also states that she has been having headaches recently.  Does not have a headache now but has been getting them more often than usual.  Also reports pain in her left arm that began today with the chest pain.  Reports dry cough for the past 2 weeks.  Denies shortness of breath, fevers, abdominal pain, nausea, vomiting, diarrhea, dizziness, lightheadedness, numbness, sick contacts.   Chest Pain Associated symptoms: weakness        Home Medications Prior to Admission medications   Medication Sig Start Date End Date Taking? Authorizing Provider  ALPRAZolam (XANAX) 0.25 MG tablet Take 1 tablet (0.25 mg total) by mouth daily as needed. Patient taking differently: Take 0.25 mg by mouth daily as needed for anxiety. 02/24/15   Rai, Vernelle Emerald, MD  apixaban (ELIQUIS) 5 MG TABS tablet Take 1 tablet (5 mg total) by mouth 2 (two) times daily. 10/02/22   Wilnette Kales,  PA  APIXABAN Arne Cleveland) VTE STARTER PACK ('10MG'$  AND '5MG'$ ) Take as directed on package: start with two-'5mg'$  tablets twice daily for 7 days. On day 8, switch to one-'5mg'$  tablet twice daily. 10/01/22   Wilnette Kales, PA  diphenhydrAMINE (BENADRYL) 50 MG tablet Take 1 tablet (50 mg total) by mouth once for 1 dose. Pt to take 50 mg of benadryl on 12/07/21 at 8 AM. Please call [phone] with any questions. 12/04/21 12/04/21  Arne Cleveland, MD  furosemide (LASIX) 20 MG tablet Take 20 mg by mouth daily as needed for fluid.  Patient not taking: Reported on 05/01/2022    [provider]  gabapentin (NEURONTIN) 300 MG capsule TAKE 2 CAPSULES BY MOUTH THREE TIMES PER DAY. CAN SLOWLY INCREASE FROM CURRENT PRESCRIPTION. 10/05/22   Lorine Bears, NP  hydrOXYzine (ATARAX) 10 MG tablet One tab every 12 hours as needed for itching 03/25/22   Glendale Chard, MD  loratadine (CLARITIN) 10 MG tablet Take 10 mg by mouth daily. In the morning    [provider]  meclizine (ANTIVERT) 25 MG tablet Take 25 mg by mouth 3 (three) times daily as needed for dizziness.  Patient not taking: Reported on 05/01/2022 12/02/19   [provider]  methocarbamol (ROBAXIN) 500 MG tablet TAKE 1 TABLET BY MOUTH EVERY 8 HOURS AS NEEDED FOR MUSCLE SPASMS. 03/13/22   Magnant, Charles L, PA-C  Multiple Vitamins-Minerals (AIRBORNE GUMMIES PO) Take by mouth.  [provider]  oxyCODONE-acetaminophen (PERCOCET) 5-325 MG tablet Take 0.5-1 tablets by mouth every 4 (four) hours as needed. 10/05/22   Orpah Greek, MD  pantoprazole (PROTONIX) 20 MG tablet Take 1 tablet (20 mg total) by mouth daily. Patient not taking: Reported on 05/01/2022 08/27/16   Harlin Heys, MD  predniSONE (DELTASONE) 20 MG tablet 3 Tabs PO Days 1-3, then 2 tabs PO Days 4-6, then 1 tab PO Day 7-9, then Half Tab PO Day 10-12 Patient not taking: Reported on 05/01/2022 06/19/21   Carlisle Cater, PA-C  predniSONE (DELTASONE) 50 MG tablet Pt  to take 50 mg of prednisone on 12/06/21 at 8 PM, 50 mg of prednisone on 12/07/21 at 2 AM, and 50 mg of prednisone on 12/07/21 at 8 AM. Pt is also to take 50 mg of benadryl on 12/07/21 at 8 AM. Please call [phone] with any questions. Patient not taking: Reported on 05/01/2022 12/04/21   Arne Cleveland, MD  predniSONE (DELTASONE) 50 MG tablet Pt to take 50 mg of prednisone on 01/08/22 at 1030 pm, 50 mg of prednisone on 01/09/22 at 430 am, and 50 mg of prednisone on 01/09/22 at 1030 am. Pt is also to take 50 mg of benadryl on 01/09/22 at 1030 am. Please call [phone] with any questions. Patient not taking: Reported on 05/01/2022 01/02/22   Criselda Peaches, MD  predniSONE (DELTASONE) 50 MG tablet Pt to take 50 mg of prednisone on 03/27/22 at 0001, 50 mg of prednisone on 03/27/22 at 0600, and 50 mg of prednisone on 03/27/22 at 1200. Pt is also to take 50 mg of benadryl on 03/27/22 at 1200. Please call [phone] with any questions. Patient not taking: Reported on 05/01/2022 03/23/22   [name], MD  ramipril (ALTACE) 2.5 MG capsule Take 2.5 mg by mouth daily. 02/17/15   [provider]  sucralfate (CARAFATE) 1 g tablet Take 1 g by mouth 4 (four) times daily. 05/16/21   [provider]  traMADol (ULTRAM) 50 MG tablet Take 1 tablet (50 mg total) by mouth every 12 (twelve) hours as needed. 05/23/22   Meredith Pel, MD  Vitamin D, Ergocalciferol, (DRISDOL) 50000 UNITS CAPS capsule Take 50,000 Units by mouth every Thursday.    [provider]      Allergies    Contrast media [iodinated contrast media], Penicillins, Baclofen, Latex, and Other    Review of Systems   Review of Systems  Cardiovascular:  Positive for chest pain.  Neurological:  Positive for weakness.  All other systems reviewed and are negative.   Physical Exam Updated Vital Signs BP 115/62 (BP Location: Right Arm)   Pulse 71   Temp 98 F (36.7 C) (Oral)   Resp (!) 23   SpO2 97%  Physical Exam Vitals and  nursing note reviewed.  Constitutional:      General: She is not in acute distress.    Appearance: She is well-developed.  HENT:     Head: Normocephalic and atraumatic.  Eyes:     Extraocular Movements: Extraocular movements intact.     Conjunctiva/sclera: Conjunctivae normal.     Pupils: Pupils are equal, round, and reactive to light.     Comments: No nystagmus  Cardiovascular:     Rate and Rhythm: Normal rate and regular rhythm.     Pulses: Normal pulses.          Radial pulses are 2+ on the right side and 2+ on the left side.  Dorsalis pedis pulses are 2+ on the right side and 2+ on the left side.     Heart sounds: No murmur heard. Pulmonary:     Effort: Pulmonary effort is normal. No respiratory distress.     Breath sounds: Normal breath sounds. No stridor. No wheezing, rhonchi or rales.  Abdominal:     Palpations: Abdomen is soft.     Tenderness: There is no abdominal tenderness.  Musculoskeletal:        General: No swelling.     Cervical back: Neck supple.     Right lower leg: No edema.     Left lower leg: No edema.  Skin:    General: Skin is warm and dry.     Capillary Refill: Capillary refill takes less than 2 seconds.  Neurological:     General: No focal deficit present.     Mental Status: She is alert and oriented to person, place, and time.     Comments: No unilateral or focal weakness, slurred speech, facial asymmetry, pronator drift.  Normal finger-to-nose.  Sensation intact in all extremities.  Normal shoulder shrug.  Psychiatric:        Mood and Affect: Mood normal.    ED Results / Procedures / Treatments   Labs (all labs ordered are listed, but only abnormal results are displayed) Labs Reviewed  RESP PANEL BY RT-PCR (FLU A&B, COVID) ARPGX2  COMPREHENSIVE METABOLIC PANEL  CBC WITH DIFFERENTIAL/PLATELET  URINALYSIS, ROUTINE W REFLEX MICROSCOPIC  BRAIN NATRIURETIC PEPTIDE  I-STAT CHEM 8, ED  TROPONIN I (HIGH SENSITIVITY)     EKG None  Radiology No results found.  Procedures Procedures  {Document cardiac monitor, telemetry assessment procedure when appropriate:1}  Medications Ordered in ED Medications  morphine (PF) 4 MG/ML injection 4 mg (has no administration in time range)  sodium chloride 0.9 % bolus 500 mL (has no administration in time range)    ED Course/ Medical Decision Making/ A&P                           Medical Decision Making Amount and/or Complexity of Data Reviewed Labs: ordered.  Risk Prescription drug management.   ***  {Document critical care time when appropriate:1} {Document review of labs and clinical decision tools ie heart score, Chads2Vasc2 etc:1}  {Document your independent review of radiology images, and any outside records:1} {Document your discussion with family members, caretakers, and with consultants:1} {Document social determinants of health affecting pt's care:1} {Document your decision making why or why not admission, treatments were needed:1} Final Clinical Impression(s) / ED Diagnoses Final diagnoses:  None    Rx / DC Orders ED Discharge Orders     None

## 2022-10-10 NOTE — ED Triage Notes (Signed)
Pt arrived via [facility] for chest pain from home. Sudden onset left sided chest pain under left breast that radiates to the left arm this morning that has worsened throughout the day. Initial pain 5/10. EMS administered 324 ASA and a total of '6mg'$  morphine enroute via 20g LAC, Pt reports recent diagnosis of DVT in left leg.   PTA EMS Vitals   BP 109/62 HR 68 SPO2 98% CBG 134

## 2022-10-11 ENCOUNTER — Observation Stay (HOSPITAL_COMMUNITY): Payer: Medicare PPO

## 2022-10-11 ENCOUNTER — Observation Stay (HOSPITAL_BASED_OUTPATIENT_CLINIC_OR_DEPARTMENT_OTHER): Payer: Medicare PPO

## 2022-10-11 ENCOUNTER — Encounter (HOSPITAL_COMMUNITY): Payer: Self-pay | Admitting: Internal Medicine

## 2022-10-11 DIAGNOSIS — I959 Hypotension, unspecified: Secondary | ICD-10-CM | POA: Diagnosis present

## 2022-10-11 DIAGNOSIS — R8281 Pyuria: Secondary | ICD-10-CM

## 2022-10-11 DIAGNOSIS — R52 Pain, unspecified: Secondary | ICD-10-CM

## 2022-10-11 DIAGNOSIS — R0781 Pleurodynia: Secondary | ICD-10-CM | POA: Diagnosis not present

## 2022-10-11 DIAGNOSIS — I10 Essential (primary) hypertension: Secondary | ICD-10-CM | POA: Diagnosis not present

## 2022-10-11 LAB — URINALYSIS, ROUTINE W REFLEX MICROSCOPIC
Bilirubin Urine: NEGATIVE
Glucose, UA: NEGATIVE mg/dL
Hgb urine dipstick: NEGATIVE
Ketones, ur: NEGATIVE mg/dL
Nitrite: NEGATIVE
Protein, ur: NEGATIVE mg/dL
Specific Gravity, Urine: 1.012 (ref 1.005–1.030)
pH: 5 (ref 5.0–8.0)

## 2022-10-11 LAB — TROPONIN I (HIGH SENSITIVITY): Troponin I (High Sensitivity): 6 ng/L (ref <18)

## 2022-10-11 MED ORDER — ONDANSETRON HCL 4 MG PO TABS
4.0000 mg | ORAL_TABLET | Freq: Four times a day (QID) | ORAL | Status: DC | PRN
  Administered 2022-10-14: 4 mg via ORAL
  Filled 2022-10-11: qty 1

## 2022-10-11 MED ORDER — SODIUM CHLORIDE 0.9 % IV SOLN
1.0000 g | Freq: Once | INTRAVENOUS | Status: AC
  Administered 2022-10-11: 1 g via INTRAVENOUS
  Filled 2022-10-11: qty 10

## 2022-10-11 MED ORDER — APIXABAN 5 MG PO TABS
5.0000 mg | ORAL_TABLET | Freq: Two times a day (BID) | ORAL | Status: DC
  Administered 2022-10-11 – 2022-10-15 (×10): 5 mg via ORAL
  Filled 2022-10-11 (×10): qty 1

## 2022-10-11 MED ORDER — OXYCODONE-ACETAMINOPHEN 5-325 MG PO TABS
1.0000 | ORAL_TABLET | Freq: Once | ORAL | Status: AC
  Administered 2022-10-11: 1 via ORAL
  Filled 2022-10-11: qty 1

## 2022-10-11 MED ORDER — TRAMADOL HCL 50 MG PO TABS: 50.0000 mg | ORAL_TABLET | Freq: Two times a day (BID) | ORAL | Status: DC | PRN

## 2022-10-11 MED ORDER — VITAMIN D (ERGOCALCIFEROL) 1.25 MG (50000 UNIT) PO CAPS
50000.0000 [IU] | ORAL_CAPSULE | Freq: Once | ORAL | Status: AC
  Administered 2022-10-11: 50000 [IU] via ORAL
  Filled 2022-10-11: qty 1

## 2022-10-11 MED ORDER — APIXABAN 5 MG PO TABS: 5.0000 mg | ORAL_TABLET | Freq: Two times a day (BID) | ORAL | Status: DC

## 2022-10-11 MED ORDER — SODIUM CHLORIDE 0.9 % IV BOLUS
1000.0000 mL | Freq: Once | INTRAVENOUS | Status: AC
  Administered 2022-10-11: 1000 mL via INTRAVENOUS

## 2022-10-11 MED ORDER — IOHEXOL 350 MG/ML SOLN
75.0000 mL | Freq: Once | INTRAVENOUS | Status: AC | PRN
  Administered 2022-10-11: 75 mL via INTRAVENOUS

## 2022-10-11 MED ORDER — PANTOPRAZOLE SODIUM 40 MG PO TBEC
80.0000 mg | DELAYED_RELEASE_TABLET | Freq: Every day | ORAL | Status: DC
  Administered 2022-10-11 – 2022-10-15 (×5): 80 mg via ORAL
  Filled 2022-10-11 (×5): qty 2

## 2022-10-11 MED ORDER — LACTATED RINGERS IV BOLUS
500.0000 mL | Freq: Once | INTRAVENOUS | Status: AC
  Administered 2022-10-11: 500 mL via INTRAVENOUS

## 2022-10-11 MED ORDER — FENTANYL CITRATE PF 50 MCG/ML IJ SOSY
50.0000 ug | PREFILLED_SYRINGE | Freq: Once | INTRAMUSCULAR | Status: AC
  Administered 2022-10-11: 50 ug via INTRAVENOUS
  Filled 2022-10-11: qty 1

## 2022-10-11 MED ORDER — ACETAMINOPHEN 650 MG RE SUPP: 650.0000 mg | Freq: Four times a day (QID) | RECTAL | Status: DC | PRN

## 2022-10-11 MED ORDER — GABAPENTIN 300 MG PO CAPS
600.0000 mg | ORAL_CAPSULE | Freq: Every day | ORAL | Status: DC | PRN
  Administered 2022-10-11: 600 mg via ORAL
  Filled 2022-10-11: qty 2

## 2022-10-11 MED ORDER — ACETAMINOPHEN 325 MG PO TABS
650.0000 mg | ORAL_TABLET | Freq: Four times a day (QID) | ORAL | Status: DC | PRN
  Administered 2022-10-11: 650 mg via ORAL
  Filled 2022-10-11 (×2): qty 2

## 2022-10-11 MED ORDER — ALPRAZOLAM 0.25 MG PO TABS
0.2500 mg | ORAL_TABLET | Freq: Every day | ORAL | Status: DC | PRN
  Administered 2022-10-12 – 2022-10-14 (×4): 0.25 mg via ORAL
  Filled 2022-10-11 (×4): qty 1

## 2022-10-11 MED ORDER — OXYCODONE-ACETAMINOPHEN 5-325 MG PO TABS
0.5000 | ORAL_TABLET | ORAL | Status: DC | PRN
  Administered 2022-10-11 – 2022-10-14 (×9): 1 via ORAL
  Administered 2022-10-14: 0.5 via ORAL
  Administered 2022-10-15: 1 via ORAL
  Filled 2022-10-11 (×13): qty 1

## 2022-10-11 MED ORDER — ONDANSETRON HCL 4 MG/2ML IJ SOLN
4.0000 mg | Freq: Four times a day (QID) | INTRAMUSCULAR | Status: DC | PRN
  Administered 2022-10-14: 4 mg via INTRAVENOUS
  Filled 2022-10-11: qty 2

## 2022-10-11 NOTE — Evaluation (Signed)
Physical Therapy Evaluation Patient Details Name: Wendy Carrillo MRN: [id number] DOB: 12/25/1940 Today's Date: 10/11/2022  History of Present Illness  81 y.o. female to the ED 12/6 with generalized weakness, poor oral intake, pleuritic left chest pain.  PMH possible prediabetes, obesity, chronic low back pain s/p laminectomy and foraminotomy, HTN, GERD with hiatal hernia, MGUS, and acute left soleal vein DVT diagnosed 11/27, started on, and adherent to, eliquis, 11/30 to ED after sliding/falling out of bed (sent home after negative CT head, CT lumbar spine and hip XR after giving fosfomycin for pyuria)  Clinical Impression   Pt admitted secondary to problem above with deficits below. Prior to recent decline (which began with fall 11/30) patient was ambulating with cane modified independent.  Pt currently requires min assist to stand and maintain balance and unable to progress to ambulation due to global feeling of weakness with ?orthostasis. (Patient was unable to stand long enough for standing BP, however on return to sitting her DBP had dropped 16 mmHg). Discussed possible need for SNF prior to return home and pt agrees this may be needed (she has been to [facility] in the past and would want to return there if needed). Anticipate patient will benefit from PT to address problems listed below.Will continue to follow acutely to maximize functional mobility independence and safety.          Recommendations for follow up therapy are one component of a multi-disciplinary discharge planning process, led by the attending physician.  Recommendations may be updated based on patient status, additional functional criteria and insurance authorization.  Follow Up Recommendations Skilled nursing-short term rehab (<3 hours/day) Can patient physically be transported by private vehicle: No    Assistance Recommended at Discharge Frequent or constant Supervision/Assistance  Patient can return home with the  following  Two people to help with walking and/or transfers;Assistance with cooking/housework;Assist for transportation;Help with stairs or ramp for entrance    Equipment Recommendations None recommended by PT  Recommendations for Other Services  OT consult    Functional Status Assessment Patient has had a recent decline in their functional status and demonstrates the ability to make significant improvements in function in a reasonable and predictable amount of time.     Precautions / Restrictions Precautions Precautions: Fall Precaution Comments: recent fall off side of bed (thinks she slid off)      Mobility  Bed Mobility Overal bed mobility: Needs Assistance Bed Mobility: Supine to Sit, Sit to Supine     Supine to sit: Min assist Sit to supine: Min assist   General bed mobility comments: on ED stretcher with head elevated; assist to raise torso and on return to lift legs onto bed    Transfers Overall transfer level: Needs assistance Equipment used: 1 person hand held assist Transfers: Sit to/from Stand Sit to Stand: From elevated surface, Min assist           General transfer comment: from ED stretcher; min assist for posterior imbalance upon coming to stand    Ambulation/Gait               General Gait Details: pt felt too weak to attempt; attempted to get orthostatic BPs but pt uanble to stand long enough  Stairs            Wheelchair Mobility    Modified Rankin (Stroke Patients Only)       Balance Overall balance assessment: Needs assistance Sitting-balance support: No upper extremity supported, Feet unsupported Sitting balance-Leahy Scale: Fair  Sitting balance - Comments: >fair NT on side of stretcher   Standing balance support: Single extremity supported Standing balance-Leahy Scale: Poor Standing balance comment: posterior imbalance in standing                             Pertinent Vitals/Pain Pain Assessment Pain  Assessment: Faces Faces Pain Scale: No hurt    Home Living Family/patient expects to be discharged to:: Private residence Living Arrangements: Spouse/significant other Available Help at Discharge: Family;Available 24 hours/day (spouse now having issues with his hip and unable to provide physical assist to pt) Type of Home: House Home Access: Stairs to enter Entrance Stairs-Rails: None Entrance Stairs-Number of Steps: 3   Home Layout: One level Home Equipment: Cane - single Associate Professor (2 wheels)      Prior Function Prior Level of Function : Independent/Modified Independent             Mobility Comments: using cane after back surgeries ADLs Comments: since DVT hasn't been able to do for herself and spouse has been helping her, but he recently hurt himself trying to care for her     Hand Dominance        Extremity/Trunk Assessment   Upper Extremity Assessment Upper Extremity Assessment: Defer to OT evaluation    Lower Extremity Assessment Lower Extremity Assessment: Generalized weakness    Cervical / Trunk Assessment Cervical / Trunk Assessment: Other exceptions Cervical / Trunk Exceptions: overweight  Communication   Communication: No difficulties  Cognition Arousal/Alertness: Awake/alert Behavior During Therapy: WFL for tasks assessed/performed Overall Cognitive Status: Within Functional Limits for tasks assessed                                 General Comments: able to provide all home set-up and recent functional status and medical history        General Comments General comments (skin integrity, edema, etc.): see flowsheet for orthostatic BPs (diastolic did drop 16 mmHg on BP taken on return to sit after standing <1 minute with reports of feeling very weak while standing    Exercises     Assessment/Plan    PT Assessment Patient needs continued PT services  PT Problem List Decreased strength;Decreased activity  tolerance;Decreased balance;Decreased mobility;Decreased knowledge of use of DME;Cardiopulmonary status limiting activity;Obesity       PT Treatment Interventions DME instruction;Gait training;Stair training;Functional mobility training;Therapeutic activities;Therapeutic exercise;Balance training;Patient/family education    PT Goals (Current goals can be found in the Care Plan section)  Acute Rehab PT Goals Patient Stated Goal: to regain her strength and be able to take care of herself PT Goal Formulation: With patient Time For Goal Achievement: 10/25/22 Potential to Achieve Goals: Good    Frequency Min 3X/week (may decr if plan for SNF confirmed)     Co-evaluation               AM-PAC PT "6 Clicks" Mobility  Outcome Measure Help needed turning from your back to your side while in a flat bed without using bedrails?: A Little Help needed moving from lying on your back to sitting on the side of a flat bed without using bedrails?: A Little Help needed moving to and from a bed to a chair (including a wheelchair)?: A Little Help needed standing up from a chair using your arms (e.g., wheelchair or bedside chair)?: A Little Help needed to walk  in hospital room?: Total Help needed climbing 3-5 steps with a railing? : Total 6 Click Score: 14    End of Session   Activity Tolerance: Treatment limited secondary to medical complications (Comment) (began to feel weak in standing; true orthostatic BP unable to be taken) Patient left: in bed;with call bell/phone within reach;with family/visitor present (on ED stretcher) Nurse Communication: Mobility status;Other (comment) (attempted orthostatics with pt feeling weak) PT Visit Diagnosis: Unsteadiness on feet (R26.81);Muscle weakness (generalized) (M62.81);Difficulty in walking, not elsewhere classified (R26.2)    Time: 1550-1616 PT Time Calculation (min) (ACUTE ONLY): 26 min   Charges:   PT Evaluation $PT Eval Low Complexity: 1 Low PT  Treatments $Therapeutic Activity: 8-22 mins         Arby Barrette, PT Acute Rehabilitation Services  Office [phone]   Wendy Carrillo 10/11/2022, 5:29 PM

## 2022-10-11 NOTE — ED Notes (Signed)
Per physical therapist : she reports that tried to do orthostatics and the patient was unable to stand long enough to even get first standing BP. I put the supine and sitting in flowsheet with a note in comments re: inability to stand long enough.

## 2022-10-11 NOTE — Progress Notes (Signed)
LLE venous duplex has been completed.   Results can be found under chart review under CV PROC. 10/11/2022 6:24 PM Chasin Findling RVT, RDMS

## 2022-10-11 NOTE — ED Provider Notes (Incomplete)
[place] EMERGENCY DEPARTMENT Provider Note   CSN: [id number] Arrival date & time: 10/10/22  2005     History {Add pertinent medical, surgical, social history, OB history to HPI:1} Chief Complaint  Patient presents with  . Chest Pain    Wendy Carrillo is a 81 y.o. female.  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  She was diagnosed with a DVT of the left lower extremity on 10/01/2022.  Has been taking her Eliquis as prescribed.  Had a fall 3 days later and was diagnosed with a contusion of the right lower extremity and cystitis.  She states that she has "not felt like myself since starting Eliquis."  She was ambulatory at baseline until approximately 5 PM tonight when she developed profound weakness.  She states that the pain below her left breast has been present for the past 4 days but got worse today.  Pain is slightly worse with deep inspiration.  She also states that she has been having headaches recently.  Does not have a headache now but has been getting them more often than usual.  Also reports pain in her left arm that began today with the chest pain.  Reports dry cough for the past 2 weeks.  Denies shortness of breath, fevers, abdominal pain, nausea, vomiting, diarrhea, dizziness, lightheadedness, numbness, sick contacts.   Chest Pain Associated symptoms: weakness        Home Medications Prior to Admission medications   Medication Sig Start Date End Date Taking? Authorizing Provider  ALPRAZolam (XANAX) 0.25 MG tablet Take 1 tablet (0.25 mg total) by mouth daily as needed. Patient taking differently: Take 0.25 mg by mouth daily as needed for anxiety. 02/24/15   Rai, Vernelle Emerald, MD  apixaban (ELIQUIS) 5 MG TABS tablet Take 1 tablet (5 mg total) by mouth 2 (two) times daily. 10/02/22   Wilnette Kales,  PA  APIXABAN Arne Cleveland) VTE STARTER PACK ('10MG'$  AND '5MG'$ ) Take as directed on package: start with two-'5mg'$  tablets twice daily for 7 days. On day 8, switch to one-'5mg'$  tablet twice daily. 10/01/22   Wilnette Kales, PA  diphenhydrAMINE (BENADRYL) 50 MG tablet Take 1 tablet (50 mg total) by mouth once for 1 dose. Pt to take 50 mg of benadryl on 12/07/21 at 8 AM. Please call [phone] with any questions. 12/04/21 12/04/21  Arne Cleveland, MD  furosemide (LASIX) 20 MG tablet Take 20 mg by mouth daily as needed for fluid.  Patient not taking: Reported on 05/01/2022    [provider]  gabapentin (NEURONTIN) 300 MG capsule TAKE 2 CAPSULES BY MOUTH THREE TIMES PER DAY. CAN SLOWLY INCREASE FROM CURRENT PRESCRIPTION. 10/05/22   Lorine Bears, NP  hydrOXYzine (ATARAX) 10 MG tablet One tab every 12 hours as needed for itching 03/25/22   Glendale Chard, MD  loratadine (CLARITIN) 10 MG tablet Take 10 mg by mouth daily. In the morning    [provider]  meclizine (ANTIVERT) 25 MG tablet Take 25 mg by mouth 3 (three) times daily as needed for dizziness.  Patient not taking: Reported on 05/01/2022 12/02/19   [provider]  methocarbamol (ROBAXIN) 500 MG tablet TAKE 1 TABLET BY MOUTH EVERY 8 HOURS AS NEEDED FOR MUSCLE SPASMS. 03/13/22   Magnant, Charles L, PA-C  Multiple Vitamins-Minerals (AIRBORNE GUMMIES PO) Take by mouth.  [provider]  oxyCODONE-acetaminophen (PERCOCET) 5-325 MG tablet Take 0.5-1 tablets by mouth every 4 (four) hours as needed. 10/05/22   Orpah Greek, MD  pantoprazole (PROTONIX) 20 MG tablet Take 1 tablet (20 mg total) by mouth daily. Patient not taking: Reported on 05/01/2022 08/27/16   Harlin Heys, MD  predniSONE (DELTASONE) 20 MG tablet 3 Tabs PO Days 1-3, then 2 tabs PO Days 4-6, then 1 tab PO Day 7-9, then Half Tab PO Day 10-12 Patient not taking: Reported on 05/01/2022 06/19/21   Carlisle Cater, PA-C  predniSONE (DELTASONE) 50 MG tablet Pt  to take 50 mg of prednisone on 12/06/21 at 8 PM, 50 mg of prednisone on 12/07/21 at 2 AM, and 50 mg of prednisone on 12/07/21 at 8 AM. Pt is also to take 50 mg of benadryl on 12/07/21 at 8 AM. Please call [phone] with any questions. Patient not taking: Reported on 05/01/2022 12/04/21   Arne Cleveland, MD  predniSONE (DELTASONE) 50 MG tablet Pt to take 50 mg of prednisone on 01/08/22 at 1030 pm, 50 mg of prednisone on 01/09/22 at 430 am, and 50 mg of prednisone on 01/09/22 at 1030 am. Pt is also to take 50 mg of benadryl on 01/09/22 at 1030 am. Please call [phone] with any questions. Patient not taking: Reported on 05/01/2022 01/02/22   Criselda Peaches, MD  predniSONE (DELTASONE) 50 MG tablet Pt to take 50 mg of prednisone on 03/27/22 at 0001, 50 mg of prednisone on 03/27/22 at 0600, and 50 mg of prednisone on 03/27/22 at 1200. Pt is also to take 50 mg of benadryl on 03/27/22 at 1200. Please call [phone] with any questions. Patient not taking: Reported on 05/01/2022 03/23/22   [name], MD  ramipril (ALTACE) 2.5 MG capsule Take 2.5 mg by mouth daily. 02/17/15   [provider]  sucralfate (CARAFATE) 1 g tablet Take 1 g by mouth 4 (four) times daily. 05/16/21   [provider]  traMADol (ULTRAM) 50 MG tablet Take 1 tablet (50 mg total) by mouth every 12 (twelve) hours as needed. 05/23/22   Meredith Pel, MD  Vitamin D, Ergocalciferol, (DRISDOL) 50000 UNITS CAPS capsule Take 50,000 Units by mouth every Thursday.    [provider]      Allergies    Contrast media [iodinated contrast media], Penicillins, Baclofen, Latex, and Other    Review of Systems   Review of Systems  Cardiovascular:  Positive for chest pain.  Neurological:  Positive for weakness.  All other systems reviewed and are negative.   Physical Exam Updated Vital Signs BP 115/62 (BP Location: Right Arm)   Pulse 71   Temp 98 F (36.7 C) (Oral)   Resp (!) 23   SpO2 97%  Physical Exam Vitals and  nursing note reviewed.  Constitutional:      General: She is not in acute distress.    Appearance: She is well-developed.  HENT:     Head: Normocephalic and atraumatic.  Eyes:     Extraocular Movements: Extraocular movements intact.     Conjunctiva/sclera: Conjunctivae normal.     Pupils: Pupils are equal, round, and reactive to light.     Comments: No nystagmus  Cardiovascular:     Rate and Rhythm: Normal rate and regular rhythm.     Pulses: Normal pulses.          Radial pulses are 2+ on the right side and 2+ on the left side.  Dorsalis pedis pulses are 2+ on the right side and 2+ on the left side.     Heart sounds: No murmur heard. Pulmonary:     Effort: Pulmonary effort is normal. No respiratory distress.     Breath sounds: Normal breath sounds. No stridor. No wheezing, rhonchi or rales.  Abdominal:     Palpations: Abdomen is soft.     Tenderness: There is no abdominal tenderness.  Musculoskeletal:        General: No swelling.     Cervical back: Neck supple.     Right lower leg: No edema.     Left lower leg: No edema.  Skin:    General: Skin is warm and dry.     Capillary Refill: Capillary refill takes less than 2 seconds.  Neurological:     General: No focal deficit present.     Mental Status: She is alert and oriented to person, place, and time.     Comments: No unilateral or focal weakness, slurred speech, facial asymmetry, pronator drift.  Normal finger-to-nose.  Sensation intact in all extremities.  Normal shoulder shrug.  Psychiatric:        Mood and Affect: Mood normal.     ED Results / Procedures / Treatments   Labs (all labs ordered are listed, but only abnormal results are displayed) Labs Reviewed  RESP PANEL BY RT-PCR (FLU A&B, COVID) ARPGX2  COMPREHENSIVE METABOLIC PANEL  CBC WITH DIFFERENTIAL/PLATELET  URINALYSIS, ROUTINE W REFLEX MICROSCOPIC  BRAIN NATRIURETIC PEPTIDE  I-STAT CHEM 8, ED  TROPONIN I (HIGH SENSITIVITY)     EKG None  Radiology No results found.  Procedures Procedures  {Document cardiac monitor, telemetry assessment procedure when appropriate:1}  Medications Ordered in ED Medications  morphine (PF) 4 MG/ML injection 4 mg (has no administration in time range)  sodium chloride 0.9 % bolus 500 mL (has no administration in time range)    ED Course/ Medical Decision Making/ A&P                           Medical Decision Making Amount and/or Complexity of Data Reviewed Labs: ordered. Radiology: ordered.  Risk Prescription drug management.  This patient presents to the ED for concern of weakness and chest pain, this involves an extensive number of treatment options, and is a complaint that carries with it a high risk of complications and morbidity. The differential diagnosis of weakness includes but is not limited to neurologic causes (GBS, myasthenia gravis, CVA, MS, ALS, transverse myelitis, spinal cord injury, CVA, botulism, ) and other causes: ACS, Arrhythmia, syncope, orthostatic hypotension, sepsis, hypoglycemia, electrolyte disturbance, hypothyroidism, respiratory failure, symptomatic anemia, dehydration, heat injury, polypharmacy, malignancy.   The emergent differential diagnosis of chest pain includes: Acute coronary syndrome, pericarditis, aortic dissection, pulmonary embolism, tension pneumothorax, and esophageal rupture.  other urgent/non-acute considerations include, but are not limited to: chronic angina, aortic stenosis, cardiomyopathy, myocarditis, mitral valve prolapse, pulmonary hypertension, hypertrophic obstructive cardiomyopathy (HOCM), aortic insufficiency, right ventricular hypertrophy, pneumonia, pleuritis, bronchitis, pneumothorax, tumor, gastroesophageal reflux disease (GERD), esophageal spasm, Mallory-Weiss syndrome, peptic ulcer disease, biliary disease, pancreatitis, functional gastrointestinal pain, cervical or thoracic disk disease or arthritis, shoulder  arthritis, costochondritis, subacromial bursitis, anxiety or panic attack, herpes zoster, breast disorders, chest wall tumors, thoracic outlet syndrome, mediastinitis.     Co morbidities that complicate the patient evaluation  .***  My initial workup includes  Additional history obtained from: Nursing notes from this visit. Prior ED visit on ***  Previous records within EMR system *** Family *** EMS ***  I ordered, reviewed and interpreted labs which include:   I ordered imaging studies including *** I independently visualized and interpreted imaging which showed *** I agree with the radiologist interpretation  Cardiac Monitoring:  .The patient was maintained on a cardiac monitor.  I personally viewed and interpreted the cardiac monitored which showed an underlying rhythm of: ***  Consultations Obtained:  I requested consultation with the ***,  and discussed lab and imaging findings as well as pertinent plan - they recommend: ***  Final decision   At this time there does not appear to be any evidence of an acute emergency medical condition and the patient appears stable for discharge with appropriate outpatient follow up. Diagnosis was discussed with patient who verbalizes understanding of care plan and is agreeable to discharge. I have discussed return precautions with patient and *** who verbalizes understanding. Patient encouraged to follow-up with their PCP within ***. All questions answered.  Patient's case discussed with Dr. Marland Kitchen who agrees with plan to discharge with follow-up.   Note: Portions of this report may have been transcribed using voice recognition software. Every effort was made to ensure accuracy; however, inadvertent computerized transcription errors may still be present.  ***  {Document critical care time when appropriate:1} {Document review of labs and clinical decision tools ie heart score, Chads2Vasc2 etc:1}  {Document your independent review of  radiology images, and any outside records:1} {Document your discussion with family members, caretakers, and with consultants:1} {Document social determinants of health affecting pt's care:1} {Document your decision making why or why not admission, treatments were needed:1} Final Clinical Impression(s) / ED Diagnoses Final diagnoses:  None    Rx / DC Orders ED Discharge Orders     None

## 2022-10-11 NOTE — ED Notes (Signed)
Physical therapy at bedside at this time

## 2022-10-11 NOTE — Progress Notes (Signed)
PT Cancellation Note  Patient Details Name: NAYELLIE SANSEVERINO MRN: [id number] DOB: 02-Dec-1940   Cancelled Treatment:    Reason Eval/Treat Not Completed: Patient not medically ready  Noted plans for CT angio to rule out PE. Awaiting test results prior to PT evaluation.    [place]  Office [phone]   Rexanne Mano 10/11/2022, 8:06 AM

## 2022-10-11 NOTE — ED Notes (Signed)
Patient assisted to bedside commode by family at this time.

## 2022-10-11 NOTE — ED Provider Notes (Signed)
4:03 AM Patient has been persistently hypotensive, but no compensatory tachycardia. This is improving with 1L IVF. Question whether hypotension may be contributing to feeling of weakness. She also has pyuria. This is unchanged since receiving Fosfomycin 6 days ago. There may be possibility of incompletely treated UTI contributing to her complaints. Do not feel hypotension is related to infection or sepsis; no leukocytosis, tachycardia, tachypnea. She has been ordered to receive 1g IV Rocephin.  CTA pending to assess for PE given pleuritic L sided chest pain. Presently without chest pain complaints. EKG does not appear acutely ischemic. Troponins have been flat, negative.   Clinical Course as of 10/11/22 0406  Thu Oct 11, 2022  0325 Patient has tolerated Cefazolin the past. Will order Rocephin for presumed incompletely treated UTI. CTA pending. [KH]    Clinical Course User Index [KH] Ernst Breach, PA-C 10/11/22 0406    Merrily Pew, MD 10/11/22 [phone]

## 2022-10-11 NOTE — ED Notes (Signed)
Assisted to the bedside commode at this time

## 2022-10-11 NOTE — H&P (Addendum)
History and Physical    Patient: Wendy Carrillo BTD:[id number] DOB: 18-Oct-1941 DOA: 10/10/2022 DOS: the patient was seen and examined on 10/11/2022 PCP: Jilda Panda, MD  Patient coming from: Home  Chief Complaint:  Chief Complaint  Patient presents with   Chest Pain   HPI: Wendy Carrillo is a 81 y.o. female with medical history significant of HTN, duodenal ulcer, chronic low back pain.  Pt recently diagnosed with DVT of LLE on 10/01/22.  Started on eliquis, taking as prescribed.  Had a fall 3 days later and was diagnosed with a contusion of the right lower extremity and cystitis.  She states that she has "not felt like myself since starting Eliquis."  She was ambulatory at baseline until approximately 5 PM tonight when she developed profound weakness.  She also has CP below her left breast that has been present for the past 4 days but got worse today.  Pain is slightly worse with deep inspiration.  She also states that she has been having headaches recently.  Does not have a headache now but has been getting them more often than usual.  Also reports pain in her left arm that began today with the chest pain.  Reports dry cough for the past 2 weeks.  Denies shortness of breath, fevers, abdominal pain, nausea, vomiting, diarrhea, dizziness, lightheadedness, numbness, sick contacts.    Review of Systems: As mentioned in the history of present illness. All other systems reviewed and are negative. Past Medical History:  Diagnosis Date   Arthritis    "all over"   Cervicalgia    Chronic low back pain    Chronic lower back pain    Coronary artery calcification    cardiology--- dr t. Oval Linsey---  nuclear study in epic 05-06-2015 no ischedmia, nuclear ef 51%;  cardiac cath 10-23-2017 mild coronary calcification in all 3V without significant obstructive disease   Enlarged lymph node    left goin   GERD (gastroesophageal reflux disease)    History of duodenal ulcer    History of  gastritis    History of hepatitis    1960  per pt had finger stick by needle was  in nursing school , does not remember  what type   Hypertension    followed by pcp   Spinal stenosis, lumbar    Ulnar neuropathy of right upper extremity    Wears glasses    Wears partial dentures    upper   Past Surgical History:  Procedure Laterality Date   ABDOMINAL HERNIA REPAIR  1980s   ANTERIOR AND POSTERIOR VAGINAL REPAIR  2015   W/  MID-URETHRAL SLING AND SACROSPINOUS FIXATION   BACK SURGERY     BREAST CYST EXCISION Bilateral    BREAST SURGERY Left    "nipple taken off"   CARPAL TUNNEL RELEASE Bilateral right 2010;  left 2012   CATARACT EXTRACTION W/ INTRAOCULAR LENS IMPLANT Left 2014   CATARACT EXTRACTION W/ INTRAOCULAR LENS IMPLANT Bilateral right 2018;  left ?   CHOLECYSTECTOMY OPEN  1980's   AND APPENDECTOMY   COLONOSCOPY W/ BIOPSIES AND POLYPECTOMY  last one 01/ 2017   DILATION AND CURETTAGE OF UTERUS  "several"   HERNIA REPAIR     LEFT HEART CATH AND CORONARY ANGIOGRAPHY N/A 10/23/2017   Procedure: LEFT HEART CATH AND CORONARY ANGIOGRAPHY;  Surgeon: Troy Sine, MD;  Location: [facility];  Service: Cardiovascular;  Laterality: N/A;   LUMBAR LAMINECTOMY/DECOMPRESSION MICRODISCECTOMY Bilateral 06/09/2018   Procedure: Laminectomy and  Foraminotomy - Lumbar Two,Lumbar Three - Lumbar One- Two - Lumbar Three-Four- bilateral;  Surgeon: Kary Kos, MD;  Location: [facility];  Service: Neurosurgery;  Laterality: Bilateral;   LUMBAR LAMINECTOMY/DECOMPRESSION MICRODISCECTOMY Bilateral 06/01/2020   Procedure: Laminectomy and Foraminotomy - left - Lumbar Two-Lumbar Three -  - bilateral - Lumbar Four-Lumbar Five;  Surgeon: Kary Kos, MD;  Location: 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 She has never used smokeless tobacco. She reports that she does not drink alcohol and does not use drugs.  Allergies  Allergen Reactions   Contrast Media [Iodinated Contrast Media] Swelling, Rash and Other (See Comments)    Face, tongue swelling Needs 13-hour prep   Penicillins Swelling and Rash    PATIENT HAS HAD A PCN REACTION WITH IMMEDIATE RASH, FACIAL/TONGUE/THROAT SWELLING, SOB, OR LIGHTHEADEDNESS WITH HYPOTENSION:  #  #  YES  #  #  Has patient had a PCN reaction causing severe rash involving mucus membranes or skin necrosis: NO Has patient had a PCN reaction that required hospitalization NO Has patient had a PCN reaction occurring within the last 10 years: NO If all of the above answers are "NO", then may proceed with Cephalosporin use.   Baclofen Itching   Latex Rash   Other Dermatitis and Other (See Comments)    Bandages caused redness     Family History  Problem Relation Age of Onset   Hypertension Mother    Stroke Mother    Diabetes Mother    Heart attack Father    Breast cancer Sister    Leukemia Brother    Breast cancer Sister    Breast cancer Sister    Heart disease Brother     Prior to Admission medications   Medication Sig Start Date End Date Taking? Authorizing Provider  ALPRAZolam (XANAX) 0.25 MG tablet Take 1 tablet (0.25 mg total) by mouth daily as needed. Patient taking differently: Take 0.25 mg by mouth daily as needed for anxiety. 02/24/15  Yes Rai, Vernelle Emerald, MD  APIXABAN Arne Cleveland) VTE STARTER  PACK ('10MG'$  AND '5MG'$ ) Take as directed on package: start with two-'5mg'$  tablets twice daily for 7 days. On day 8, switch to one-'5mg'$  tablet twice daily. 10/01/22  Yes Dion Saucier A, PA  esomeprazole (NEXIUM) 40 MG capsule Take 40 mg by mouth daily. 09/13/22  Yes [provider]  furosemide (LASIX) 20 MG tablet Take 20 mg by mouth daily as needed for fluid.   Yes [provider]  gabapentin (NEURONTIN) 300 MG capsule TAKE 2 CAPSULES BY MOUTH THREE TIMES PER DAY. CAN SLOWLY INCREASE FROM CURRENT PRESCRIPTION. Patient taking differently: Take 600 mg by mouth daily as needed (for pain). Can slowly increase from current prescription. 10/05/22  Yes Barnet Pall E, NP  hydrOXYzine (ATARAX) 10 MG tablet One tab every 12  hours as needed for itching Patient taking differently: Take 10 mg by mouth daily as needed for itching. 03/25/22  Yes Glendale Chard, MD  loratadine (CLARITIN) 10 MG tablet Take 10 mg by mouth daily as needed for allergies. In the morning   Yes [provider]  Multiple Vitamins-Minerals (AIRBORNE GUMMIES PO) Take 2 tablets by mouth daily.   Yes [provider]  oxyCODONE-acetaminophen (PERCOCET) 5-325 MG tablet Take 0.5-1 tablets by mouth every 4 (four) hours as needed. 10/05/22  Yes Pollina, Gwenyth Allegra, MD  ramipril (ALTACE) 2.5 MG capsule Take 2.5 mg by mouth daily. 02/17/15  Yes [provider]  traMADol (ULTRAM) 50 MG tablet Take 1 tablet (50 mg total) by mouth every 12 (twelve) hours as needed. Patient taking differently: Take 50 mg by mouth every 12 (twelve) hours as needed for moderate pain. 05/23/22  Yes Meredith Pel, MD  Vitamin D, Ergocalciferol, (DRISDOL) 50000 UNITS CAPS capsule Take 50,000 Units by mouth every Thursday.   Yes [provider]  apixaban (ELIQUIS) 5 MG TABS tablet Take 1 tablet (5 mg total) by mouth 2 (two) times daily. Patient not taking: Reported on 10/11/2022 10/02/22   Wilnette Kales, [place]     Physical Exam: Vitals:   10/11/22 0300 10/11/22 0403 10/11/22 0404 10/11/22 0423  BP: (!) 110/58 (!) 110/55  (!) 110/55  Pulse: 78 79  78  Resp: '18 20  20  '$ Temp:   98.1 F (36.7 C) 98.1 F (36.7 C)  TempSrc:   Oral Oral  SpO2: 96% 96%  96%  Weight:      Height:       Constitutional: NAD, calm, comfortable Eyes: PERRL, lids and conjunctivae normal ENMT: Mucous membranes are moist. Posterior pharynx clear of any exudate or lesions.Normal dentition.  Neck: normal, supple, no masses, no thyromegaly Respiratory: clear to auscultation bilaterally, no wheezing, no crackles. Normal respiratory effort. No accessory muscle use.  Cardiovascular: Regular rate and rhythm, no murmurs / rubs / gallops. No extremity edema. 2+ pedal pulses. No carotid bruits.  Abdomen: no tenderness, no masses palpated. No hepatosplenomegaly. Bowel sounds positive.  Musculoskeletal: no clubbing / cyanosis. No joint deformity upper and lower extremities. Good ROM, no contractures. Normal muscle tone.  Skin: no rashes, lesions, ulcers. No induration Neurologic: CN 2-12 grossly intact. Sensation intact, DTR normal. Strength 5/5 in all 4.  Psychiatric: Normal judgment and insight. Alert and oriented x 3. Normal mood.   Data Reviewed:    Trop neg     Latest Ref Rng & Units 10/10/2022    9:25 PM 10/10/2022    8:30 PM 10/04/2022    9:10 PM  CBC  WBC 4.0 - 10.5 K/uL  8.7  7.1   Hemoglobin 12.0 - 15.0 g/dL 13.6  13.3  13.2   Hematocrit 36.0 - 46.0 % 40.0  40.6  41.4   Platelets 150 - 400 K/uL  244  250       Latest Ref Rng & Units 10/10/2022    9:25 PM 10/10/2022    8:30 PM 10/04/2022    9:10 PM  CMP  Glucose 70 - 99 mg/dL 110  117  103   BUN 8 - 23 mg/dL '13  12  12   '$ Creatinine 0.44 - 1.00 mg/dL 1.00  0.98  1.03   Sodium 135 - 145 mmol/L 137  139  138   Potassium 3.5 - 5.1 mmol/L 4.1  4.1  3.9   Chloride 98 - 111 mmol/L 102  105  104   CO2 22 - 32 mmol/L  25  28   Calcium 8.9 - 10.3 mg/dL  8.5  9.1    Total Protein 6.5 - 8.1 g/dL  5.5  6.3   Total Bilirubin 0.3 - 1.2 mg/dL  0.5  0.4   Alkaline Phos 38 - 126 U/L  46  49   AST 15 - 41 U/L  19  16   ALT 0 - 44 U/L  11  11      Assessment and Plan: * Pleuritic chest pain Pt with onset of pleuritic CP, generalized weakness, in setting of known recent DVT of LE.  On eliquis treatment at home. PE is obviously the concern, but not clear that this represents failure of eliquis given only ~10 days of treatment. CTA chest for PE ordered, though she is currently getting a 13h steroid prep protocol for this given h/o allergy to IVC dye (tolerates in past with steroid prep). Continue eliquis for the moment Tele monitor  Hypotension BP on low side today (41-638 systolic), but no tachycardia or distress.  Doubt massive PE. ? If contributing to generalized weakness though. Giving IVF bolus, got 1.5L already, giving another 500cc bolus. Is making urine (needs to use bathroom now) Monitor for the moment.  Pyuria Pt also with pyuria on 11/30 and again today.  Possibly partially treated UTI.  Got fosfomycin last week. Got rocephin x1 dose in ED UCx pending. Will defer need for further ABx for the moment.  Essential hypertension Hold home BP meds in setting of soft BPs here in ED.      Advance Care Planning:   Code Status: Full Code  Consults: None  Family Communication: No family in room  Severity of Illness: The appropriate patient status for this patient is OBSERVATION. Observation status is judged to be reasonable and necessary in order to provide the required intensity of service to ensure the patient's safety. The patient's presenting symptoms, physical exam findings, and initial radiographic and laboratory data in the context of their medical condition is felt to place them at decreased risk for further clinical deterioration. Furthermore, it is anticipated that the patient will be medically stable for discharge from the hospital  within 2 midnights of admission.   Author: Etta Quill., DO 10/11/2022 6:04 AM  For on call review [email or web address].

## 2022-10-11 NOTE — Assessment & Plan Note (Signed)
Pt also with pyuria on 11/30 and again today.  Possibly partially treated UTI.  Got fosfomycin last week. Got rocephin x1 dose in ED UCx pending. Will defer need for further ABx for the moment.

## 2022-10-11 NOTE — Assessment & Plan Note (Signed)
Pt with onset of pleuritic CP, generalized weakness, in setting of known recent DVT of LE.  On eliquis treatment at home. PE is obviously the concern, but not clear that this represents failure of eliquis given only ~10 days of treatment. CTA chest for PE ordered, though she is currently getting a 13h steroid prep protocol for this given h/o allergy to IVC dye (tolerates in past with steroid prep). Continue eliquis for the moment Tele monitor

## 2022-10-11 NOTE — Progress Notes (Signed)
PROGRESS NOTE  Brief Narrative: Wendy Carrillo is an 81 y.o. 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 She was admitted this morning with plans for PT/OT evaluations and CTA chest to evaluate for PE, but requiring 13-hour contrast-allergy preparation.   Subjective: Pt with several issues including pain under left breast with deep breathing, increased left leg pain despite taking eliquis 100%. Pain worse with weight bearing, so hasn't been getting up nearly as often. Fatigue and weight loss date back months. No urinary symptoms reported. Does have intermittent sweats. Son in town from [place], [place] at bedside.  Objective: BP 116/62   Pulse 80   Temp 98 F (36.7 C) (Oral)   Resp 19   Ht '5\' 4"'$  (1.626 m)   Wt 90.7 kg   SpO2 96%   BMI 34.33 kg/m   Gen: Elderly pleasant female in no acute distress, does appear tired Pulm: Clear and nonlabored on room air CV: RRR, no murmur, no JVD, trace L > RLE edema GI: Soft, NT, ND, +BS  Neuro: Alert and oriented. No focal deficits. Skin: No rashes, lesions or ulcers. Midline lumbar surgical scar noted.  Assessment & Plan: Please see H&P by Dr. Fabio Neighbors this morning for full details.   Acute left soleal DVT: Dx 11/27, has had some worsening of the pain associated with this which has in turn led to decreased mobility. Also note pleuritic chest pain, though has ruled out for PE.  - Not clearly a failure of eliquis at this point, but would be if DVT has extended, will check LE U/S limited to LLE.  - This  was provoked in setting of air travel over the holidays. Pt is UTD on typical cancer screenings. 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 Could consider repeat colonoscopy though she's aged out by typical criteria.   Hypotension: While not rising to the level of AKI, creatinine is elevated from baseline and pt reports poor oral intake.  - Given 2L IVF. To avoid overload, will monitor off further IVF for now. BP improving.  - Check orthostatics  Weakness, nonfocal:  - PT/OT evaluations still pending to inform disposition venue.   ?prediabetes:  - Has had weight loss and poor appetite, possibly temporally related to rybelsus. Will have her follow up with her PCP, Dr. MMellody Drown to discuss this further.?if this is contributing to weakness. Glucose at inpatient goal here.   Vitamin D deficiency: Reports she's due for her weekly dose - Vitamin D 50k units x1.   Obesity: Body mass index is 34.33 kg/m.   Hypoalbuminemia:  - Dietitian consult  RPatrecia Pour MD Pager on amion 10/11/2022, 3:33 PM

## 2022-10-11 NOTE — ED Notes (Signed)
PA Humes notified of hypotension. Order for NS bolus received and administered.

## 2022-10-11 NOTE — Assessment & Plan Note (Signed)
Hold home BP meds in setting of soft BPs here in ED.

## 2022-10-11 NOTE — Assessment & Plan Note (Addendum)
BP on low side today (24-097 systolic), but no tachycardia or distress.  Doubt massive PE. ? If contributing to generalized weakness though. Giving IVF bolus, got 1.5L already, giving another 500cc bolus. Is making urine (needs to use bathroom now) Monitor for the moment.

## 2022-10-11 NOTE — ED Notes (Signed)
Pt assisted to bedside commode. Pt able to turn and pivot with assistance. Urine sample collected. Pt assisted back to bed and provided new linins. Per MD pt able to eat and drink. Snacks were provided.

## 2022-10-11 NOTE — ED Notes (Signed)
Orthostatics not complete at this time due to the patient reporting that she would like to try later instead of now she reports she is tired after PT trial

## 2022-10-12 DIAGNOSIS — I959 Hypotension, unspecified: Secondary | ICD-10-CM

## 2022-10-12 DIAGNOSIS — R0781 Pleurodynia: Secondary | ICD-10-CM | POA: Diagnosis not present

## 2022-10-12 DIAGNOSIS — I10 Essential (primary) hypertension: Secondary | ICD-10-CM | POA: Diagnosis not present

## 2022-10-12 DIAGNOSIS — R8281 Pyuria: Secondary | ICD-10-CM | POA: Diagnosis not present

## 2022-10-12 LAB — T4, FREE: Free T4: 0.95 ng/dL (ref 0.61–1.12)

## 2022-10-12 LAB — BASIC METABOLIC PANEL
Anion gap: 7 (ref 5–15)
BUN: 14 mg/dL (ref 8–23)
CO2: 24 mmol/L (ref 22–32)
Calcium: 8.7 mg/dL — ABNORMAL LOW (ref 8.9–10.3)
Chloride: 105 mmol/L (ref 98–111)
Creatinine, Ser: 0.79 mg/dL (ref 0.44–1.00)
GFR, Estimated: >60 mL/min (ref >60)
Glucose, Bld: 128 mg/dL — ABNORMAL HIGH (ref 70–99)
Potassium: 3.9 mmol/L (ref 3.5–5.1)
Sodium: 136 mmol/L (ref 135–145)

## 2022-10-12 LAB — MRSA NEXT GEN BY PCR, NASAL: MRSA by PCR Next Gen: NOT DETECTED

## 2022-10-12 LAB — TSH: TSH: 0.178 u[IU]/mL — ABNORMAL LOW (ref 0.350–4.500)

## 2022-10-12 LAB — VITAMIN B12: Vitamin B-12: 170 pg/mL — ABNORMAL LOW (ref 180–914)

## 2022-10-12 MED ORDER — ALUM & MAG HYDROXIDE-SIMETH 200-200-20 MG/5ML PO SUSP
15.0000 mL | Freq: Four times a day (QID) | ORAL | Status: DC | PRN
  Administered 2022-10-12 – 2022-10-15 (×5): 15 mL via ORAL
  Filled 2022-10-12 (×5): qty 30

## 2022-10-12 MED ORDER — ADULT MULTIVITAMIN W/MINERALS CH
1.0000 | ORAL_TABLET | Freq: Every day | ORAL | Status: DC
  Administered 2022-10-12 – 2022-10-15 (×4): 1 via ORAL
  Filled 2022-10-12 (×4): qty 1

## 2022-10-12 NOTE — NC FL2 (Signed)
[place] LEVEL OF CARE FORM     IDENTIFICATION  Patient Name: Wendy Carrillo Birthdate: [birth year] Sex: female Admission Date (Current Location): 10/10/2022  [facility] and [place] Number:  Herbalist and Address:  [facility], [place] [address] [place], [place] [address]      Provider Number: [id number]  Attending Physician Name and Address:  Patrecia Pour, MD  Relative Name and Phone Number:  Truitt Merle [phone]    Current Level of Care: Hospital Recommended Level of Care: [place] Prior Approval Number:    Date Approved/Denied:   PASRR Number: [id number]  Discharge Plan: SNF    Current Diagnoses: Patient Active Problem List   Diagnosis Date Noted   Pleuritic chest pain 10/11/2022   Pyuria 10/11/2022   Hypotension 10/11/2022   Spinal stenosis at L4-L5 level 05/18/2019   Benign essential HTN    History of total bilateral knee replacement (TKR)    Acute blood loss anemia    Post-operative pain    Spinal stenosis of lumbar region 06/09/2018   Spinal stenosis, lumbar    Hypertension    History of duodenal ulcer    Hepatitis    Gastritis    Family history of adverse reaction to anesthesia    Chronic lower back pain    Arthritis    Coronary artery calcification 10/21/2017   Combined forms of age-related cataract of right eye 04/18/2017   Status post total bilateral knee replacement 06/08/2016   Left lumbar radiculopathy 05/09/2016   Bilateral shoulder pain 03/22/2016   DDD (degenerative disc disease), cervical 03/22/2016   Morbid obesity with BMI of 40.0-44.9, adult ([place]) 03/08/2016   Spondylolisthesis, 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/2016   Neck pain 03/08/2016   Lumbar stenosis 03/08/2016   Cervical spinal stenosis 03/08/2016   Back pain 11/16/2015   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/10/2015   Angina pectoris ([place]) 04/20/2015   Morbid obesity ([place]) 04/20/2015   Dyspnea 04/20/2015   Atypical  chest pain 04/20/2015   Esophageal reflux 02/23/2015   Essential hypertension 02/23/2015   Chronic back pain 02/23/2015   Chest pain 02/22/2015   Abdominal tenderness, LLQ (left lower quadrant) 06/15/2014   Acute sinusitis 06/15/2014   Allergic rhinitis due to pollen 06/15/2014   Bladder disorder 06/15/2014   Epistaxis 06/15/2014   Female stress incontinence 06/15/2014   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   Insomnia 06/15/2014   Localized primary osteoarthritis of wrist 06/15/2014   Lumbosacral spondylosis 06/15/2014   Osteoarthrosis, localized, primary, hand 06/15/2014   Referred otalgia 06/15/2014   Tingling 06/15/2014   Tinnitus 06/15/2014   Urinary incontinence 06/15/2014   Cystocele 05/04/2014   Retention of urine 05/04/2014   Cystocele, lateral 04/12/2014   Rectocele 03/17/2014   History of blood transfusion 11/05/1990    Orientation RESPIRATION BLADDER Height & Weight     Self, Time, Situation, Place  Normal Continent Weight: 216 lb 14.9 oz (98.4 kg) Height:  '5\' 4"'$  (162.6 cm)  BEHAVIORAL SYMPTOMS/MOOD NEUROLOGICAL BOWEL NUTRITION STATUS      Continent Diet (See DC summary)  AMBULATORY STATUS COMMUNICATION OF NEEDS Skin   Extensive Assist Verbally Normal                       Personal Care Assistance Level of Assistance  Bathing, Feeding, Dressing Bathing Assistance: Maximum assistance Feeding assistance: Independent Dressing Assistance: Maximum assistance     Functional Limitations Info  Sight, Hearing, Speech Sight Info: Adequate Hearing  Info: Adequate Speech Info: Adequate    SPECIAL CARE FACTORS FREQUENCY  PT (By licensed PT), OT (By licensed OT)     PT Frequency: 5x week OT Frequency: 5x week            Contractures Contractures Info: Not present    Additional Factors Info  Code Status, Allergies Code Status Info: Full Allergies Info: Contrast Media (Iodinated Contrast Media)  Penicillins  Baclofen  Latex  Other           Current  Medications (10/12/2022):  This is the current hospital active medication list Current Facility-Administered Medications  Medication Dose Route Frequency Provider Last Rate Last Admin   acetaminophen (TYLENOL) tablet 650 mg  650 mg Oral Q6H PRN Etta Quill, DO   650 mg at 10/11/22 1637   Or   acetaminophen (TYLENOL) suppository 650 mg  650 mg Rectal Q6H PRN Etta Quill, DO       ALPRAZolam Duanne Moron) tablet 0.25 mg  0.25 mg Oral Daily PRN Etta Quill, DO   0.25 mg at 10/12/22 0222   apixaban (ELIQUIS) tablet 5 mg  5 mg Oral BID Antonietta Breach, PA-C   5 mg at 10/12/22 4696   gabapentin (NEURONTIN) capsule 600 mg  600 mg Oral Daily PRN Etta Quill, DO   600 mg at 10/11/22 1152   ondansetron (ZOFRAN) tablet 4 mg  4 mg Oral Q6H PRN Etta Quill, DO       Or   ondansetron [facility]) injection 4 mg  4 mg Intravenous Q6H PRN Etta Quill, DO       oxyCODONE-acetaminophen (PERCOCET/ROXICET) 5-325 MG per tablet 0.5-1 tablet  0.5-1 tablet Oral Q4H PRN Etta Quill, DO   1 tablet at 10/12/22 2952   pantoprazole (PROTONIX) EC tablet 80 mg  80 mg Oral Q1200 Etta Quill, DO   80 mg at 10/11/22 1152   traMADol (ULTRAM) tablet 50 mg  50 mg Oral Q12H PRN Etta Quill, DO       Facility-Administered Medications Ordered in Other Encounters  Medication Dose Route Frequency Provider Last Rate Last Admin   diphenhydrAMINE (BENADRYL) capsule 50 mg  50 mg Oral Once [name], MD         Discharge Medications: Please see discharge summary for a list of discharge medications.  Relevant Imaging Results:  Relevant Lab Results:   Additional Information SS# [id number] LCSWA

## 2022-10-12 NOTE — Evaluation (Signed)
Occupational Therapy Evaluation Patient Details Name: Wendy Carrillo MRN: [id number] DOB: 13-Nov-1940 Today's Date: 10/12/2022   History of Present Illness 81 y.o. female to the ED 12/6 with generalized weakness, poor oral intake, pleuritic left chest pain.  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 Currently requires mod A with LB ADL and min A with limited mobility @ RW level due to below listed deficits. Able to take 5 steps today before requesting to sit down. Pt would benefit from rehab at [facility] and prefers [facility] made aware. Pt states that if she doesn't go to [facility], that she "will go home". Acute OT to follow to maximize functional level of independence.      Recommendations for follow up therapy are one component of a multi-disciplinary discharge planning process, led by the attending physician.  Recommendations may be updated based on patient status, additional functional criteria and insurance authorization.   Follow Up Recommendations  Skilled nursing-short term rehab (<3 hours/day)     Assistance Recommended at Discharge Frequent or constant Supervision/Assistance  Patient can return home with the following A little help with walking and/or transfers;A little help with bathing/dressing/bathroom;Assistance with cooking/housework;Direct supervision/assist for medications management;Direct supervision/assist for financial management;Assist for transportation;Help with stairs or ramp for entrance    Functional  Status Assessment  Patient has had a recent decline in their functional status and demonstrates the ability to make significant improvements in function in a reasonable and predictable amount of time.  Equipment Recommendations  None recommended by OT    Recommendations for Other Services       Precautions / Restrictions Precautions Precautions: Fall Precaution Comments: recent fall off side of bed (thinks she slid off) Restrictions Weight Bearing Restrictions: No      Mobility Bed Mobility Overal bed mobility: Needs Assistance Bed Mobility: Supine to Sit, Sit to Supine     Supine to sit: Supervision          Transfers Overall transfer level: Needs assistance   Transfers: Sit to/from Stand Sit to Stand: Min guard                  Balance Overall balance assessment: Needs assistance Sitting-balance support: No upper extremity supported, Feet unsupported Sitting balance-Leahy Scale: Fair Sitting balance - Comments: >fair NT on side of stretcher   Standing balance support: Single extremity supported Standing balance-Leahy Scale: Poor Standing balance comment: posterior imbalance in standing                           ADL either performed or assessed with clinical judgement   ADL Overall ADL's : Needs assistance/impaired     Grooming: Set up   Upper Body Bathing: Set up;Sitting   Lower Body Bathing: Moderate assistance;Sit to/from stand   Upper Body Dressing : Set up;Sitting   Lower Body Dressing: Moderate assistance;Sit to/from stand   Toilet Transfer: Minimal assistance;Ambulation   Toileting- Clothing Manipulation and Hygiene: Moderate assistance       Functional mobility during ADLs:  Minimal assistance;Rolling walker (2 wheels);Cueing for safety       Vision Baseline Vision/History: 1 Wears glasses       Perception     Praxis      Pertinent Vitals/Pain Pain Assessment Pain Assessment: Faces Faces Pain Scale: Hurts  little more Pain Location: head; back Pain Descriptors / Indicators: Discomfort Pain Intervention(s): Limited activity within patient's tolerance, Premedicated before session     Hand Dominance Right   Extremity/Trunk Assessment Upper Extremity Assessment Upper Extremity Assessment: Generalized weakness;LUE deficits/detail LUE Deficits / Details: L shoulder weakness at baseline   Lower Extremity Assessment Lower Extremity Assessment: Defer to PT evaluation   Cervical / Trunk Assessment Cervical / Trunk Assessment: Other exceptions Cervical / Trunk Exceptions: overweight   Communication Communication Communication: No difficulties   Cognition Arousal/Alertness: Awake/alert Behavior During Therapy: WFL for tasks assessed/performed (tangential at times) Overall Cognitive Status: Within Functional Limits for tasks assessed                                 General Comments: able to provide all home set-up and recent functional status and medical history     General Comments       Exercises Exercises: Other exercises Other Exercises Other Exercises: chair marching Other Exercises: encouraged chair level exercises   Shoulder Instructions      Home Living Family/patient expects to be discharged to:: Private residence Living Arrangements: Spouse/significant other Available Help at Discharge: Family;Available 24 hours/day (spouse now having issues with his hip and unable to provide physical assist to pt) Type of Home: House Home Access: Stairs to enter [employer] of Steps: 3 Entrance Stairs-Rails: None Home Layout: One level     Bathroom Shower/Tub: Occupational psychologist: Handicapped height Bathroom Accessibility: Yes How Accessible: Accessible via walker Home Equipment: [place] - single point;BSC/3in1;Rolling Walker (2 wheels)          Prior Functioning/Environment Prior Level of Function : Independent/Modified  Independent;Working/employed             Mobility Comments: using cane after back surgeries ADLs Comments: since DVT hasn't been able to do for herself and spouse has been helping her, but he recently hurt himself trying to care for her; works in her [employer] in [employer]; retired Educational psychologist Ed teacher        OT Problem List: Decreased strength;Decreased activity tolerance;Impaired balance (sitting and/or standing);Decreased range of motion;Decreased safety awareness;Decreased knowledge of use of DME or AE;Obesity      OT Treatment/Interventions: Self-care/ADL training;Therapeutic exercise;Energy conservation;DME and/or AE instruction;Therapeutic activities;Cognitive remediation/compensation;Patient/family education;Balance training    OT Goals(Current goals can be found in the care plan section) Acute Rehab OT Goals Patient Stated Goal: to go to [facility] for rehab OT Goal Formulation: With patient/family Time For Goal Achievement: 10/26/22 Potential to Achieve Goals: Good  OT Frequency: Min 2X/week    Co-evaluation              AM-PAC OT "6 Clicks" Daily Activity     Outcome Measure Help from another person eating meals?: None Help from another person taking care of personal grooming?: A Little Help from another person toileting, which includes using toliet, bedpan, or urinal?: A Lot Help from another person bathing (including washing, rinsing, drying)?: A Lot Help from another person to put on and taking off regular upper body clothing?: A Little Help from another person to put on and taking off regular  lower body clothing?: A Lot 6 Click Score: 16   End of Session Equipment Utilized During Treatment: Gait belt;Rolling walker (2 wheels) Nurse Communication: Mobility status;Other (comment) (asking for Protonix)  Activity Tolerance: Patient tolerated treatment well Patient left: in chair;with call bell/phone within reach;with family/visitor present  OT Visit Diagnosis:  Unsteadiness on feet (R26.81);Other abnormalities of gait and mobility (R26.89);Muscle weakness (generalized) (M62.81);Pain Pain - part of body:  (headache/back)                Time: 1314-3888 OT Time Calculation (min): 37 min Charges:  OT General Charges $OT Visit: 1 Visit OT Evaluation $OT Eval Moderate Complexity: 1 Mod OT Treatments $Self Care/Home Management : 8-22 mins  Maurie Boettcher, OT/L   Acute OT Clinical Specialist Acute Rehabilitation Services Pager [phone] Office [phone]   [facility] 10/12/2022, 11:07 AM

## 2022-10-12 NOTE — Progress Notes (Signed)
Initial Nutrition Assessment  DOCUMENTATION CODES:   Obesity unspecified  INTERVENTION:  - Liberalize to Regular diet.   - Add MVI q day.   NUTRITION DIAGNOSIS:   Inadequate oral intake related to poor appetite as evidenced by per patient/family report.  GOAL:   Patient will meet greater than or equal to 90% of their needs  MONITOR:   PO intake  REASON FOR ASSESSMENT:   Consult Assessment of nutrition requirement/status, Poor PO  ASSESSMENT:   81 y.o. female admits related to chest pain. PMH includes: HTN, duodenal ulcer, chronic low back pain. Pt is currently receiving medical management for pleuritic chest pain.  Meds reviewed. Labs reviewed.   The pt reports that she has not had the best appetite over the past several months. She states that this was related to the blood pressure medication she was on. No significant wt loss per record. Pt states that she would likely eat more food on a regular diet. RD will inquire about liberalizing pt's diet. 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 She also reports that she has been receiving milk which she does not drink. RD will add lactose intolerance to allergy list.   NUTRITION - FOCUSED PHYSICAL EXAM:  WNL - no wasting noted.  Diet Order:   Diet Order             Diet Heart Room service appropriate? Yes; Fluid consistency: Thin  Diet effective now                   EDUCATION NEEDS:   Not appropriate for education at this time  Skin:  Skin Assessment: Reviewed RN Assessment  Last BM:  unknown  Height:   Ht Readings from Last 1 Encounters:  10/11/22 '5\' 4"'$  (1.626 m)    Weight:   Wt Readings from Last 1 Encounters:  10/11/22 98.4 kg    Ideal Body Weight:     BMI:  Body mass index is 37.24 kg/m.  Estimated Nutritional Needs:   Kcal:  5009-3818 kcals  Protein:  95-125 gm  Fluid:  >/= 1.9 L  Thalia Bloodgood, RD, LDN, CNSC

## 2022-10-12 NOTE — Progress Notes (Addendum)
PROGRESS NOTE  Brief Narrative: Wendy Carrillo is an 81 y.o. 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 She was admitted with plans for PT/OT evaluations and CTA chest to evaluate for PE, but requiring 13-hour contrast-allergy preparation. CTA chest showed no PE and repeat U/S did not reveal any new DVT in the LLE. PT/OT recommend SNF to which the patient and family agree. [name] working diligently on this.  Subjective: Says she feels much better. No further pains in the chest, she's working on getting OOB more, still poor appetite and feels weak but generally better.  Objective: BP 126/60 (BP Location: Left Arm)   Pulse 70   Temp 97.6 F (36.4 C) (Oral)   Resp 19   Ht '5\' 4"'$  (1.626 m)   Wt 98.4 kg   SpO2 97%   BMI 37.24 kg/m   Gen: Pleasant, elderly female in no distress Pulm: Nonlabored, clear CV: RRR, no MRG or JVD. LLE edema is mild GI: +BS, soft, NT, ND Neuro: Alert and oriented without focal deficits on limited exam   Assessment & Plan: Pleuritic chest pain: No definite cause has been found, though this symptom is much better.   Acute left soleal DVT: Dx 11/27, has had some worsening of the pain associated with this which has in turn led to decreased mobility. Also note pleuritic chest pain, though has ruled out for PE.  - Not clearly a failure of eliquis at this point, but would be if DVT has extended, will check LE U/S limited to LLE.  - This was provoked in setting of air travel over the holidays. Pt is UTD on typical cancer  screenings. Has MGUS though CBC is normal and hematology is following q70month. Could consider repeat colonoscopy though she's aged out by typical criteria.   Hypotension: While not rising to the level of AKI, creatinine was elevated from baseline and improved to putative baseline after IVF.  - Pt reported too tired to perform orthostatics. Will recheck, though BP has now normalized and she does not report orthostatic symptoms.  Weakness, nonfocal:  - PT/OT evaluations performed, continuing. Currently recommend SNF for safest disposition given patient's capacity for rehabilitation. [name] aware.  - TSH checked and appears depressed. Will need to ask about biotin. Check free T3, free T4, and TRAb's  Asymptomatic bacteriuria: 50k colonies of Pseudomonas on urine culture, though patient has no leukocytosis, fever, suprapubic pain or urinary symptoms. Has subjectively improved with fluids and no antimicrobial Tx. Cx sent days after receiving fosfomycin. - No treatment currently planned.   ?prediabetes:  - Has had weight loss and poor appetite, possibly temporally related to rybelsus. Will have her follow up with her PCP, Dr. MMellody Drown to discuss this further.?if this is contributing to weakness. Glucose at inpatient goal here without intervention.  Vitamin D deficiency:  - Vitamin D 50k units x1 on 12/7.   Obesity: Body mass index is 37.24 kg/m.   Hypoalbuminemia:  - Dietitian consulted, liberalized diet.  RPatrecia Pour MD Pager on amion 10/12/2022, 3:04 PM

## 2022-10-12 NOTE — TOC Initial Note (Addendum)
Transition of Care [facility]) - Initial/Assessment Note    Patient Details  Name: Wendy Carrillo MRN: [id number] Date of Birth: 1941-03-22  Transition of Care [facility]) CM/SW Contact:    Coralee Pesa, [place] Phone Number: 10/12/2022, 11:14 AM  Clinical Narrative:                 [name] notified by medical team that pt is currently medically stable, but requesting SNF. Per RN and PT, pt is only interested in [facility]. [name] met with pt and daughter at bedside and pt confirmed that if she cannot go to [place] she would like to go home. Pt states her husband cannot assist her at home, but they have all of the necessary equipment at home. Pt states she would have either family or paid caregivers care for her. She states she has had [facility] before, but does not remember which company, she is agreeable to [facility] again. Dtr asks if there are any other facilities that are similar to [facility], [facility] listed a few, but pt was familiar and did not want them. [name] advised that [name] is often full, but a referral would be sent.  [name] spoke with Whitney at [facility] who noted they would need to review pt and let [name] know, they would not have a bed until Monday if accepted. [name] to send referral and continue to follow for DC needs.  3:00 [name] called and notified [name] that pt has been accepted and can DC on Monday. [name] updated family and medical team. [name] to request weekend social workers start insurance authorization. TOC will continue to follow for DC needs. Expected Discharge Plan: Skilled Nursing Facility Barriers to Discharge: Insurance Authorization, SNF Pending bed offer   Patient Goals and CMS Choice Patient states their goals for this hospitalization and ongoing recovery are:: Pt states her goal is to go to [facility] or go home. CMS Medicare.gov Compare Post Acute Care list provided to:: Patient Choice offered to / list presented to : Patient  Expected Discharge Plan and Services Expected Discharge Plan:  [place] Choice: [place] arrangements for the past 2 months: Single Family Home                                      Prior Living Arrangements/Services Living arrangements for the past 2 months: Single Family Home Lives with:: Spouse Patient language and need for interpreter reviewed:: Yes Do you feel safe going back to the place where you live?: Yes      Need for Family Participation in Patient Care: Yes (Comment) Care giver support system in place?: Yes (comment)   Criminal Activity/Legal Involvement Pertinent to Current Situation/Hospitalization: No - Comment as needed  Activities of Daily Living Home Assistive Devices/Equipment: Cane (specify quad or straight), Wheelchair, Environmental consultant (specify type), Built-in shower seat, Eyeglasses, Grab bars around toilet, Grab bars in shower, Raised toilet seat with rails, Scales ADL Screening (condition at time of admission) Patient's cognitive ability adequate to safely complete daily activities?: Yes Is the patient deaf or have difficulty hearing?: No Does the patient have difficulty seeing, even when wearing glasses/contacts?: No Does the patient have difficulty concentrating, remembering, or making decisions?: No Patient able to express need for assistance with ADLs?: Yes Does the patient have difficulty dressing or bathing?: Yes Independently performs ADLs?: No Communication: Independent Dressing (OT): Needs assistance Is  this a change from baseline?: Pre-admission baseline Grooming: Independent Feeding: Independent Bathing: Needs assistance Is this a change from baseline?: Pre-admission baseline Toileting: Needs assistance Is this a change from baseline?: Pre-admission baseline In/Out Bed: Needs assistance Is this a change from baseline?: Pre-admission baseline Walks in Home: Needs assistance Is this a change from baseline?: Pre-admission baseline Does the patient  have difficulty walking or climbing stairs?: Yes Weakness of Legs: Left Weakness of Arms/Hands: Both  Permission Sought/Granted Permission sought to share information with : Family Supports Permission granted to share information with : Yes, Verbal Permission Granted  Share Information with NAME: Truitt Merle     Permission granted to share info w Relationship: Daughter  Permission granted to share info w Contact Information: [phone]  Emotional Assessment Appearance:: Appears stated age Attitude/Demeanor/Rapport: Engaged Affect (typically observed): Appropriate Orientation: : Oriented to Self, Oriented to Place, Oriented to  Time, Oriented to Situation Alcohol / Substance Use: Not Applicable Psych Involvement: No (comment)  Admission diagnosis:  Pleuritic chest pain [R07.81] Pyuria [R82.81] Generalized weakness [R53.1] Hypotension, unspecified hypotension type [I95.9] Patient Active Problem List   Diagnosis Date Noted   Pleuritic chest pain 10/11/2022   Pyuria 10/11/2022   Hypotension 10/11/2022   Spinal stenosis at L4-L5 level 05/18/2019   Benign essential HTN    History of total bilateral knee replacement (TKR)    Acute blood loss anemia    Post-operative pain    Spinal stenosis of lumbar region 06/09/2018   Spinal stenosis, lumbar    Hypertension    History of duodenal ulcer    Hepatitis    Gastritis    Family history of adverse reaction to anesthesia    Chronic lower back pain    Arthritis    Coronary artery calcification 10/21/2017   Combined forms of age-related cataract of right eye 04/18/2017   Status post total bilateral knee replacement 06/08/2016   Left lumbar radiculopathy 05/09/2016   Bilateral shoulder pain 03/22/2016   DDD (degenerative disc disease), cervical 03/22/2016   Morbid obesity with BMI of 40.0-44.9, adult ([place]) 03/08/2016   Spondylolisthesis, 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/2016   Neck pain 03/08/2016   Lumbar stenosis 03/08/2016   Cervical  spinal stenosis 03/08/2016   Back pain 11/16/2015   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/10/2015   Angina pectoris ([place]) 04/20/2015   Morbid obesity ([place]) 04/20/2015   Dyspnea 04/20/2015   Atypical chest pain 04/20/2015   Esophageal reflux 02/23/2015   Essential hypertension 02/23/2015   Chronic back pain 02/23/2015   Chest pain 02/22/2015   Abdominal tenderness, LLQ (left lower quadrant) 06/15/2014   Acute sinusitis 06/15/2014   Allergic rhinitis due to pollen 06/15/2014   Bladder disorder 06/15/2014   Epistaxis 06/15/2014   Female stress incontinence 06/15/2014   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   Insomnia 06/15/2014   Localized primary osteoarthritis of wrist 06/15/2014   Lumbosacral spondylosis 06/15/2014   Osteoarthrosis, localized, primary, hand 06/15/2014   Referred otalgia 06/15/2014   Tingling 06/15/2014   Tinnitus 06/15/2014   Urinary incontinence 06/15/2014   Cystocele 05/04/2014   Retention of urine 05/04/2014   Cystocele, lateral 04/12/2014   Rectocele 03/17/2014   History of blood transfusion 11/05/1990   PCP:  Jilda Panda, MD Pharmacy:   [id number] [place], [place] - [id number]: [phone]: [phone]    Social Determinants of Health (SDOH) Interventions    Readmission Risk Interventions  No data to display

## 2022-10-12 NOTE — Plan of Care (Signed)

## 2022-10-13 DIAGNOSIS — I10 Essential (primary) hypertension: Secondary | ICD-10-CM | POA: Diagnosis not present

## 2022-10-13 DIAGNOSIS — R8281 Pyuria: Secondary | ICD-10-CM | POA: Diagnosis not present

## 2022-10-13 DIAGNOSIS — R0781 Pleurodynia: Secondary | ICD-10-CM | POA: Diagnosis not present

## 2022-10-13 DIAGNOSIS — I959 Hypotension, unspecified: Secondary | ICD-10-CM | POA: Diagnosis not present

## 2022-10-13 LAB — URINE CULTURE: Culture: 50000 — AB

## 2022-10-13 MED ORDER — POLYETHYLENE GLYCOL 3350 17 G PO PACK
17.0000 g | PACK | Freq: Once | ORAL | Status: AC
  Administered 2022-10-13: 17 g via ORAL
  Filled 2022-10-13: qty 1

## 2022-10-13 NOTE — Progress Notes (Signed)
PROGRESS NOTE  Brief Narrative: Wendy Carrillo is an 81 y.o. 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 She was admitted with plans for PT/OT evaluations and CTA chest to evaluate for PE, but requiring 13-hour contrast-allergy preparation. CTA chest showed no PE and repeat U/S did not reveal any new DVT in the LLE. PT/OT recommend SNF to which the patient and family agree. [name] working diligently on this.  Subjective: Feeling better, still transferring to [facility] with great effort, still diffusely weak without new deficits. No chest pain or dyspnea.  Objective: BP 120/62 (BP Location: Left Arm)   Pulse 66   Temp 97.8 F (36.6 C) (Oral)   Resp 17   Ht '5\' 4"'$  (1.626 m)   Wt 98.4 kg   SpO2 94%   BMI 37.24 kg/m   Gen: No distress Pulm: Clear, nonlabored  CV: RRR, occasional PVC, no MRG or pitting edema GI: Soft, NT, ND, +BS  Neuro: Alert and oriented. No new focal deficits. Ext: Warm, no deformities Skin: No rashes, lesions or ulcers on visualized skin   Assessment & Plan: Pleuritic chest pain: No definite cause has been found, though this symptom is much better. No hemodynamic instability or sustained dysrhythmias, can DC continuous pulse oximetry and cardiac telemetry in effort to facilitate mobility.   Acute left soleal DVT: Dx 11/27, has had some worsening of the pain associated with this which has in turn led to decreased mobility. Also note pleuritic chest pain, though has ruled out for PE.  - Not clearly a failure of eliquis. No new  DVT seen on U/S.  - This was provoked in setting of air travel over the holidays. Pt is UTD on typical cancer screenings. Has MGUS though CBC is normal and hematology is following q66month. Could consider repeat colonoscopy though she's aged out by typical criteria.   Hypotension: While not rising to the level of AKI, creatinine was elevated from baseline and improved to putative baseline after IVF.  - Pt reported too tired to perform orthostatics. Will recheck, though BP has now normalized and she does not report orthostatic symptoms.  Weakness, nonfocal:  - PT/OT evaluations performed, continuing. Will continue this at SNF, can accept 12/11.   Abnormal TSH: TSH depressed at 0.178, free T4 normal at 0.95 - Free T3 and TRAb pending. Pt takes airborne but no purely biotin containing vitamin. not clinically hyperthyroid, will suggest recheck labs in 2-4 weeks.  Asymptomatic bacteriuria: 50k colonies of Pseudomonas on urine culture, though patient has no leukocytosis, fever, suprapubic pain or urinary symptoms. Has subjectively improved with fluids and no antimicrobial Tx. Cx sent days after receiving fosfomycin. - No treatment currently planned.   ?prediabetes:  - Has had weight loss and poor appetite, possibly temporally related to rybelsus. Will have her follow up with her PCP, Dr. MMellody Drown to discuss this further.?if this is contributing to weakness. Glucose at inpatient goal here without intervention.  Vitamin D deficiency:  - Vitamin D 50k units x1 on 12/7.   Obesity: Body mass index is 37.24 kg/m.  Hypoalbuminemia:  - Dietitian consulted, liberalized diet.  Patrecia Pour, MD Pager on amion 10/13/2022, 9:07 AM

## 2022-10-13 NOTE — Progress Notes (Signed)
   10/13/22 1000  Mobility  Activity Stood at bedside  Level of Assistance Independent after set-up  Assistive Device None  Distance Ambulated (ft) 0 ft  Activity Response Tolerated fair  Mobility Referral Yes  $Mobility charge 1 Mobility   Mobility Specialist Progress Note  Pre-Mobility: 69 HR, 125/58 BP  Pt in bed and agreeable. Had c/o lightheadedness while standing up and deferred further ambulation. Returned back to bed w/ all needs met and call bell in reach. Will f/u as time permits.  Lucious Groves Mobility Specialist  Please contact via SecureChat or Rehab office at [phone]

## 2022-10-14 DIAGNOSIS — I10 Essential (primary) hypertension: Secondary | ICD-10-CM | POA: Diagnosis not present

## 2022-10-14 DIAGNOSIS — R8281 Pyuria: Secondary | ICD-10-CM | POA: Diagnosis not present

## 2022-10-14 DIAGNOSIS — R0781 Pleurodynia: Secondary | ICD-10-CM | POA: Diagnosis not present

## 2022-10-14 DIAGNOSIS — I959 Hypotension, unspecified: Secondary | ICD-10-CM | POA: Diagnosis not present

## 2022-10-14 LAB — BASIC METABOLIC PANEL
Anion gap: 5 (ref 5–15)
BUN: 9 mg/dL (ref 8–23)
CO2: 30 mmol/L (ref 22–32)
Calcium: 8.9 mg/dL (ref 8.9–10.3)
Chloride: 101 mmol/L (ref 98–111)
Creatinine, Ser: 0.93 mg/dL (ref 0.44–1.00)
GFR, Estimated: >60 mL/min (ref >60)
Glucose, Bld: 151 mg/dL — ABNORMAL HIGH (ref 70–99)
Potassium: 4.1 mmol/L (ref 3.5–5.1)
Sodium: 136 mmol/L (ref 135–145)

## 2022-10-14 LAB — RESPIRATORY PANEL BY PCR

## 2022-10-14 LAB — RESP PANEL BY RT-PCR (FLU A&B, COVID) ARPGX2
Influenza A by PCR: NEGATIVE
Influenza B by PCR: NEGATIVE
SARS Coronavirus 2 by RT PCR: NEGATIVE

## 2022-10-14 LAB — THYROTROPIN RECEPTOR AUTOABS: Thyrotropin Receptor Ab: 2.83 IU/L — ABNORMAL HIGH (ref 0.00–1.75)

## 2022-10-14 MED ORDER — OXYMETAZOLINE HCL 0.05 % NA SOLN
1.0000 | Freq: Two times a day (BID) | NASAL | Status: DC | PRN
  Filled 2022-10-14: qty 30

## 2022-10-14 MED ORDER — FOSFOMYCIN TROMETHAMINE 3 G PO PACK
3.0000 g | PACK | Freq: Once | ORAL | Status: AC
  Administered 2022-10-14: 3 g via ORAL
  Filled 2022-10-14: qty 3

## 2022-10-14 MED ORDER — FLUTICASONE PROPIONATE 50 MCG/ACT NA SUSP
1.0000 | Freq: Every day | NASAL | Status: DC
  Administered 2022-10-14: 1 via NASAL
  Filled 2022-10-14: qty 16

## 2022-10-14 MED ORDER — CYANOCOBALAMIN 1000 MCG/ML IJ SOLN
1000.0000 ug | Freq: Every day | INTRAMUSCULAR | Status: DC
  Administered 2022-10-14 – 2022-10-15 (×2): 1000 ug via INTRAMUSCULAR
  Filled 2022-10-14 (×2): qty 1

## 2022-10-14 NOTE — Progress Notes (Signed)
PROGRESS NOTE  Brief Narrative: Wendy Carrillo is an 81 y.o. 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 She was admitted with plans for PT/OT evaluations and CTA chest to evaluate for PE, but requiring 13-hour contrast-allergy preparation. CTA chest showed no PE and repeat U/S did not reveal any new DVT in the LLE. PT/OT recommend SNF which will have a bed available 12/11.   Subjective: Says she needs her pain medication this morning. Drank just fine but no appetite for breakfast. Having pain in back and legs chronically. Some headache globally without neck pain/stiffness. New nasal congestion and rhinorrhea without cough or shortness of breath.   Objective: BP 138/69 (BP Location: Left Arm)   Pulse 62   Temp 98 F (36.7 C) (Oral)   Resp 16   Ht '5\' 4"'$  (1.626 m)   Wt 98.4 kg   SpO2 96%   BMI 37.24 kg/m   Gen: Elderly pleasant female in no distress HEENT: Oropharynx clear, upper dentures in place, boggy nasal turbinates.  Pulm: Clear, nonlabored  CV: RRR, no MRG or pitting edema GI: Soft, NT, ND, +BS  Neuro: Alert and oriented. No new focal deficits. Ext: Warm, no deformities Skin: No rashes, lesions or ulcers on visualized skin   Assessment & Plan: Pleuritic chest pain: No definite cause has been found, though this symptom is much better. No hemodynamic instability or sustained dysrhythmias, can DC continuous pulse oximetry and cardiac telemetry in effort to facilitate mobility.   Acute left soleal DVT: Dx 11/27, has had some  worsening of the pain associated with this which has in turn led to decreased mobility. Also note pleuritic chest pain, though has ruled out for PE.  - Continue eliquis. No new DVT seen on U/S.  - This was provoked in setting of air travel over the holidays. Pt is UTD on typical cancer screenings. Has MGUS though CBC is normal and hematology is following q77month. Could consider repeat colonoscopy though she's aged out by typical criteria.   Hypotension: While not rising to the level of AKI, creatinine was elevated from baseline and improved to putative baseline after IVF.  - BP improved. No bleeding, dehydration or sepsis currently noted  URI: High current prevalence of respiratory viruses including RSV, flu, covid. Check viral panel, droplet precautions, symptomatic management with nasal spray for now. No respiratory symptoms or signs.  Weakness, nonfocal:  - PT/OT evaluations performed, continuing. Will continue this at SNF, can accept 12/11.   Abnormal TSH: TSH depressed at 0.178, free T4 normal at 0.95 - Free T3 and TRAb pending. Pt takes airborne but no purely biotin containing vitamin. not clinically hyperthyroid, will suggest recheck labs in 2-4 weeks.  Vitamin B12 deficiency:  - Start IM supplementation and continue orally on discharge.  Asymptomatic bacteriuria: 50k colonies of Pseudomonas on urine culture, though patient has no leukocytosis, fever, suprapubic pain or urinary symptoms. Has subjectively improved with fluids and no antimicrobial Tx. Cx sent days after receiving fosfomycin. - No treatment currently planned.   ?prediabetes:  - Has had weight loss and  poor appetite, possibly temporally related to rybelsus. Will have her follow up with her PCP, Dr. Mellody Drown, to discuss this further.?if this is contributing to weakness. Glucose at inpatient goal here without intervention.  Vitamin D deficiency:  - Vitamin D 50k units x1 on 12/7.   Obesity: Body mass index is 37.24 kg/m.    Hypoalbuminemia:  - Dietitian consulted, liberalized diet.  Patrecia Pour, MD Pager on amion 10/14/2022, 8:01 AM

## 2022-10-14 NOTE — Progress Notes (Signed)
   10/14/22 1000  Mobility  Activity Ambulated with assistance in hallway  Level of Assistance Contact guard assist, steadying assist  Assistive Device Front wheel walker  Distance Ambulated (ft) 70 ft  Activity Response Tolerated well  Mobility Referral Yes  $Mobility charge 1 Mobility   Mobility Specialist Progress Note  Pt was in bed and agreeable. Had c/o leg pain throughout ambulation. Returned to bed w/ all needs met and call bell in reach.   Lucious Groves Mobility Specialist  Please contact via SecureChat or Rehab office at [phone]

## 2022-10-14 NOTE — Progress Notes (Signed)
Patient is complaining of urinary frequency. She says she has been to the bathroom a few times every hours. This is not normal for her. She is concerned that, although a UA was done in the ED, she should have another UA to check for infection.  She also said her legs are cramping and thinks her potassium is low, as this is the symptom for her when she is low. BMP last checked 12/8.  Paged MD re above. See orders.  Continue to monitor.

## 2022-10-14 NOTE — TOC Progression Note (Signed)
Transition of Care [facility]) - Progression Note    Patient Details  Name: Wendy Carrillo MRN: [id number] Date of Birth: 07/04/1941  Transition [facility] Care [facility]) CM/SW [place], LCSW Phone Number: 10/14/2022, 9:21 AM  Clinical Narrative:    [name] started auth for [facility] for a start date of 10/15/2022. Reference # [id number].  TOC team will continue to assist with discharge planning needs.     Expected Discharge Plan: Skilled Nursing Facility Barriers to Discharge: Ship broker, SNF Pending bed offer  Expected Discharge Plan and Services Expected Discharge Plan: [place] Choice: [place] arrangements for the past 2 months: Single Family Home                                       Social Determinants of Health (SDOH) Interventions    Readmission Risk Interventions   No data to display

## 2022-10-15 DIAGNOSIS — R0781 Pleurodynia: Secondary | ICD-10-CM | POA: Diagnosis not present

## 2022-10-15 LAB — T3, FREE: T3, Free: 1.8 pg/mL — ABNORMAL LOW (ref 2.0–4.4)

## 2022-10-15 MED ORDER — ALPRAZOLAM 0.25 MG PO TABS: 0.2500 mg | ORAL_TABLET | Freq: Every day | ORAL | 0 refills | Status: AC | PRN

## 2022-10-15 MED ORDER — VITAMIN B-12 1000 MCG PO TABS: 1000.0000 ug | ORAL_TABLET | Freq: Every day | ORAL | Status: AC

## 2022-10-15 MED ORDER — FLUTICASONE PROPIONATE 50 MCG/ACT NA SUSP: 1.0000 | Freq: Every day | NASAL | Status: AC | PRN

## 2022-10-15 MED ORDER — TRAMADOL HCL 50 MG PO TABS: 50.0000 mg | ORAL_TABLET | Freq: Two times a day (BID) | ORAL | 0 refills | Status: DC | PRN

## 2022-10-15 NOTE — Progress Notes (Signed)
Mobility Specialist Progress Note    10/15/22 0959  Mobility  Activity Ambulated with assistance in hallway  Level of Assistance Contact guard assist, steadying assist  Assistive Device Front wheel walker  Distance Ambulated (ft) 80 ft  Activity Response Tolerated well  Mobility Referral Yes  $Mobility charge 1 Mobility   Pt received in bed and agreeable. C/o some leg weakness. Returned to bed with call bell in reach.    Hildred Alamin Mobility Specialist  Please Psychologist, sport and exercise or Rehab Office at [phone]

## 2022-10-15 NOTE — Progress Notes (Signed)
PT Cancellation Note  Patient Details Name: Wendy Carrillo MRN: [id number] DOB: Aug 15, 1941   Cancelled Treatment:    Reason Eval/Treat Not Completed: Awaiting PTAR for transport to SNF, will defer treatment.  Mabeline Caras, PT, DPT Acute Rehabilitation Services  Personal: [place] Rehab Office: [place] 10/15/2022, 12:34 PM

## 2022-10-15 NOTE — Discharge Summary (Signed)
Physician Discharge Summary   Patient: Wendy Carrillo MRN: [id number] DOB: 06/04/1941  Admit date:     10/10/2022  Discharge date: 10/15/22  Discharge Physician: Patrecia Pour   PCP: Jilda Panda, MD   Recommendations at discharge:  Follow up with PCP in 1-2 weeks.  Suggest withholding rybelsus due to insufficient oral intake which may be related to this medication. Needs repeat TSH, free T4, T3, and follow up TR Ab's at follow up.   Discharge Diagnoses: Principal Problem:   Pleuritic chest pain Active Problems:   Pyuria   Hypotension   Essential hypertension  Hospital Course: Wendy Carrillo is an 81 y.o. 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 She was admitted with plans for PT/OT evaluations and CTA chest to evaluate for PE, but requiring 13-hour contrast-allergy preparation. CTA chest showed no PE and repeat U/S did not reveal any new DVT in the LLE. Without directed intervention, chest pain resolved. PT/OT recommend SNF which will have a bed available 12/11.    Assessment and Plan: Pleuritic chest pain: No definite cause has been found, no PE, though this symptom is much better. No hemodynamic instability or sustained dysrhythmias  - Continue tramadol, gabapentin which are prescribed for chronic pain.    Acute left soleal DVT: Dx 11/27, has had some worsening of the pain associated with this which has in turn led to decreased mobility. Also note pleuritic chest pain, though has ruled out for PE.  - Continue eliquis. No new  DVT seen on U/S.  - This was provoked in setting of air travel over the holidays. Pt reports she is UTD on typical cancer screenings. Has MGUS though CBC is normal and hematology is following q47month. Could consider repeat colonoscopy though she's aged out by typical criteria.    Hypotension: While not rising to the level of AKI, creatinine was elevated from baseline and improved to putative baseline after IVF, stable.  - BP improved. No bleeding, dehydration or sepsis currently noted. Remains normotensive off home ramipril. [place] more stable.    URI symptoms: Improving. Negative respiratory virus panel, influenza and SARS-CoV-2 PCR testing. Recommend symptomatic management   Weakness, nonfocal:  - PT/OT evaluations performed, continuing. Will continue this at SNF, can accept 12/11.    Abnormal TSH: TSH depressed at 0.178, free T4 normal at 0.95 - Free T3 and TRAb pending. Pt takes airborne but no biotin containing vitamin. Not clinically hyperthyroid, will suggest recheck labs in 2-4 weeks.   Vitamin B12 deficiency:  - Started IM supplementation and will continue orally on discharge.   UTI: 50k colonies of Pseudomonas on urine culture, though patient has no leukocytosis, fever, suprapubic pain or urinary symptoms at presentation. Urinary frequency reported 12/10, so fosfomycin x1 given.   Prediabetes: reported - Has had weight loss and poor appetite, possibly temporally related to rybelsus. Will have her follow up with her PCP, Dr. MMellody Drown to discuss this further.?if this is contributing to weakness. Glucose at inpatient goal here without intervention.  Anxiety: Continue low dose prn alprazolam. Pt has taken this here without sedation. It is a home med though last fill by PDMP review was 05/15/2022, prescribed by PCP. Will continue for now.   Vitamin D deficiency:  - Vitamin D supplement to continue on schedule   Obesity: Body  mass index is 37.24 kg/m.    Hypoalbuminemia:  - Dietitian consulted   Consultants: None Procedures performed: None  Disposition: Skilled nursing facility Diet recommendation:  Cardiac and Carb modified diet DISCHARGE MEDICATION: Allergies as of 10/15/2022       Reactions   Contrast Media [iodinated Contrast Media] Swelling, Rash, Other (See Comments)   Face, tongue swelling Needs 13-hour prep   Penicillins Swelling, Rash   PATIENT HAS HAD A PCN REACTION WITH IMMEDIATE RASH, FACIAL/TONGUE/THROAT SWELLING, SOB, OR LIGHTHEADEDNESS WITH HYPOTENSION:  #  #  YES  #  #  Has patient had a PCN reaction causing severe rash involving mucus membranes or skin necrosis: NO Has patient had a PCN reaction that required hospitalization NO Has patient had a PCN reaction occurring within the last 10 years: NO If all of the above answers are "NO", then may proceed with Cephalosporin use.   Lactose Intolerance (gi) Diarrhea   Baclofen Itching   Latex Rash   Other Dermatitis, Other (See Comments)   Bandages caused redness         Medication List     STOP taking these medications    oxyCODONE-acetaminophen 5-325 MG tablet Commonly known as: Percocet   ramipril 2.5 MG capsule Commonly known as: ALTACE       TAKE these medications    AIRBORNE GUMMIES PO Take 2 tablets by mouth daily.   ALPRAZolam 0.25 MG tablet Commonly known as: XANAX Take 1 tablet (0.25 mg total) by mouth daily as needed for anxiety.   apixaban 5 MG Tabs tablet Commonly known as: ELIQUIS Take 1 tablet (5 mg total) by mouth 2 (two) times daily. What changed: Another medication with the same name was removed. Continue taking this medication, and follow the directions you see here.   cyanocobalamin 1000 MCG tablet Commonly known as: VITAMIN B12 Take 1 tablet (1,000 mcg total) by mouth daily.   esomeprazole 40 MG capsule Commonly known as: NEXIUM Take 40 mg by mouth daily.   fluticasone 50 MCG/ACT nasal  spray Commonly known as: FLONASE Place 1 spray into both nostrils daily as needed for allergies or rhinitis.   furosemide 20 MG tablet Commonly known as: LASIX Take 20 mg by mouth daily as needed for fluid.   gabapentin 300 MG capsule Commonly known as: NEURONTIN TAKE 2 CAPSULES BY MOUTH THREE TIMES PER DAY. CAN SLOWLY INCREASE FROM CURRENT PRESCRIPTION. What changed: See the new instructions.   hydrOXYzine 10 MG tablet Commonly known as: ATARAX One tab every 12 hours as needed for itching What changed:  how much to take how to take this when to take this reasons to take this additional instructions   loratadine 10 MG tablet Commonly known as: CLARITIN Take 10 mg by mouth daily as needed for allergies. In the morning   traMADol 50 MG tablet Commonly known as: ULTRAM Take 1 tablet (50 mg total) by mouth every 12 (twelve) hours as needed for moderate pain or severe pain. What changed: reasons to take this   Vitamin D (Ergocalciferol) 1.25 MG (50000 UNIT) Caps capsule Commonly known as: DRISDOL Take 50,000 Units by mouth every Thursday.        Follow-up Information  Jilda Panda, MD Follow up.   Specialty: Internal Medicine Contact information: [phone] [phone]                Discharge Exam: Filed Weights   10/10/22 2035 10/11/22 2147  Weight: 90.7 kg 98.4 kg  BP 126/62 (BP Location: Left Arm)   Pulse 66   Temp (!) 97.5 F (36.4 C) (Temporal)   Resp 20   Ht '5\' 4"'$  (1.626 m)   Wt 98.4 kg   SpO2 93%   BMI 37.24 kg/m   Gen: Elderly pleasant female in no distress HEENT: Oropharynx clear, upper dentures in place Pulm: Clear, nonlabored  CV: RRR, no MRG or pitting edema GI: Soft, NT, ND, +BS  Neuro: Alert and oriented. No new focal deficits. Ext: Warm, no deformities Skin: No rashes, lesions or ulcers on visualized skin   Condition at discharge: stable  The results of significant diagnostics from this  hospitalization (including imaging, microbiology, ancillary and laboratory) are listed below for reference.   Imaging Studies: VAS [place] LOWER EXTREMITY VENOUS (DVT)  Result Date: 10/12/2022  Lower Venous DVT Study Patient Name:  MOKSHA DORGAN  Date of Exam:   10/11/2022 Medical Rec #: [id number]          Accession #:    [id number] Date of Birth: 1941-08-25          Patient Gender: F Patient Age:   1 years Exam Location:  [facility] Procedure:      VAS [place] LOWER EXTREMITY VENOUS (DVT) Referring Phys: Vance Gather --------------------------------------------------------------------------------  Indications: Pain.  Anticoagulation: Eliquis. Limitations: Poor ultrasound/tissue interface. Comparison Study: Previous exam on 10/01/22 was positive for DVT in left soleal                   vein. Performing Technologist: Rogelia Rohrer RVT, RDMS  Examination Guidelines: A complete evaluation includes B-mode imaging, spectral Doppler, color Doppler, and power Doppler as needed of all accessible portions of each vessel. Bilateral testing is considered an integral part of a complete examination. Limited examinations for reoccurring indications may be performed as noted. The reflux portion of the exam is performed with the patient in reverse Trendelenburg.  +-----+---------------+---------+-----------+----------+--------------+ RIGHTCompressibilityPhasicitySpontaneityPropertiesThrombus Aging +-----+---------------+---------+-----------+----------+--------------+ CFV  Full           Yes      Yes                                 +-----+---------------+---------+-----------+----------+--------------+   +---------+---------------+---------+-----------+----------+-------------------+ LEFT     CompressibilityPhasicitySpontaneityPropertiesThrombus Aging      +---------+---------------+---------+-----------+----------+-------------------+ CFV      Full           Yes      Yes                                       +---------+---------------+---------+-----------+----------+-------------------+ SFJ      Full                                                             +---------+---------------+---------+-----------+----------+-------------------+ FV Prox  Full           Yes  Yes                                      +---------+---------------+---------+-----------+----------+-------------------+ FV Mid   Full           Yes      Yes                                      +---------+---------------+---------+-----------+----------+-------------------+ FV DistalFull           Yes      Yes                                      +---------+---------------+---------+-----------+----------+-------------------+ PFV      Full                                                             +---------+---------------+---------+-----------+----------+-------------------+ POP      Full           Yes      Yes                                      +---------+---------------+---------+-----------+----------+-------------------+ PTV      Full                                                             +---------+---------------+---------+-----------+----------+-------------------+ PERO     Full                                                             +---------+---------------+---------+-----------+----------+-------------------+ Soleal                                                not visualized on                                                         this exam           +---------+---------------+---------+-----------+----------+-------------------+     Summary: RIGHT: - No evidence of common femoral vein obstruction.  LEFT: - There is no evidence of deep vein thrombosis in the lower extremity.  - No cystic structure found in the popliteal fossa.  *See table(s) above for measurements and observations. Electronically signed by Servando Snare MD on 10/12/2022 at 5:02:55  PM.  Final    CT Angio Chest PE W/Cm &/Or Wo Cm  Result Date: 10/11/2022 CLINICAL DATA:  81 year old female with altered mental status. Chest pain, left-side chest and breast pain radiating to the arm. EXAM: CT ANGIOGRAPHY CHEST WITH CONTRAST TECHNIQUE: Multidetector CT imaging of the chest was performed using the standard protocol during bolus administration of intravenous contrast. Multiplanar CT image reconstructions and MIPs were obtained to evaluate the vascular anatomy. RADIATION DOSE REDUCTION: This exam was performed according to the departmental dose-optimization program which includes automated exposure control, adjustment of the mA and/or kV according to patient size and/or use of iterative reconstruction technique. CONTRAST:  39m OMNIPAQUE IOHEXOL 350 MG/ML SOLN COMPARISON:  Portable chest 0317 hours today. CT Abdomen and Pelvis 11/11/2021. FINDINGS: Cardiovascular: Good contrast bolus timing in the pulmonary arterial tree. Mild respiratory motion. No pulmonary artery filling defect identified. Calcified coronary artery atherosclerosis and/or stents. Cardiac size at the upper limits of normal. Little contrast in the thoracic aorta. No pericardial effusion. Minimal thoracic aorta atherosclerosis. Mediastinum/Nodes: Positive for moderate size gastric hiatal hernia. No mediastinal mass or lymphadenopathy. Lungs/Pleura: Lower lung volumes compared to January. Atelectatic changes to the major airways which remain patent. Bilateral pulmonary mosaic attenuation more pronounced in the middle and lower lobes. Otherwise similar lower lobe atelectasis or mild scarring compared to January. No consolidation. No pleural effusion. Upper Abdomen: Visible upper abdominal viscera stable from the January CT Abdomen and Pelvis, including exophytic but simple fluid density right upper pole renal cyst (no follow-up imaging recommended). Musculoskeletal: No acute osseous abnormality identified. Review of the MIP images  confirms the above findings. IMPRESSION: 1. Negative for acute pulmonary embolus. 2. Low lung volumes with symmetric mosaic attenuation favored to reflect a combination of atelectasis and gas trapping in both lungs. No consolidation or pleural effusion. 3. Stable visible upper abdomen including moderate size gastric hiatal hernia. 4. Calcified coronary artery atherosclerosis and/or stents. Electronically Signed   By: HGenevie AnnM.D.   On: 10/11/2022 11:30   CT Head Wo Contrast  Result Date: 10/11/2022 CLINICAL DATA:  Mental status change, unknown cause EXAM: CT HEAD WITHOUT CONTRAST TECHNIQUE: Contiguous axial images were obtained from the base of the skull through the vertex without intravenous contrast. RADIATION DOSE REDUCTION: This exam was performed according to the departmental dose-optimization program which includes automated exposure control, adjustment of the mA and/or kV according to patient size and/or use of iterative reconstruction technique. COMPARISON:  10/04/2022 FINDINGS: Brain: There is atrophy and chronic small vessel disease changes. No acute intracranial abnormality. Specifically, no hemorrhage, hydrocephalus, mass lesion, acute infarction, or significant intracranial injury. Vascular: No hyperdense vessel or unexpected calcification. Skull: No acute calvarial abnormality. Sinuses/Orbits: No acute findings Other: None IMPRESSION: Atrophy, chronic microvascular disease. No acute intracranial abnormality. Electronically Signed   By: KRolm BaptiseM.D.   On: 10/11/2022 00:14   CT Lumbar Spine Wo Contrast  Result Date: 10/04/2022 CLINICAL DATA:  Trauma and low back pain EXAM: CT LUMBAR SPINE WITHOUT CONTRAST TECHNIQUE: Multidetector CT imaging of the lumbar spine was performed without intravenous contrast administration. Multiplanar CT image reconstructions were also generated. RADIATION DOSE REDUCTION: This exam was performed according to the departmental dose-optimization program which  includes automated exposure control, adjustment of the mA and/or kV according to patient size and/or use of iterative reconstruction technique. COMPARISON:  None Available. FINDINGS: Segmentation: 5 lumbar type vertebrae. Alignment: Grade 1 retrolisthesis at L1-2 and L2-3. Grade 1 anterolisthesis at L4-5. Vertebrae: No acute fracture or focal pathologic process.  Opposing endplate sclerosis at N2-7 Paraspinal and other soft tissues: Negative Disc levels: At L2-3 there is moderate spinal canal stenosis due to endplate spurring and facet hypertrophy. There is severe narrowing of both neural foramina. At L3-4, there is severe right and moderate left foraminal stenosis due to facet hypertrophy. At L4-5, there is severe facet arthrosis with mild foraminal narrowing on the right. At L5-S1, there is severe bilateral neural foraminal stenosis due to endplate spurring and facet hypertrophy IMPRESSION: 1. No acute fracture of the lumbar spine. 2. 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 3. Severe right and moderate left foraminal stenosis at L3-4. 4. Severe bilateral neural foraminal stenosis at L5-S1. Electronically Signed   By: Ulyses Jarred M.D.   On: 10/04/2022 23:02   CT HEAD WO CONTRAST (5MM)  Result Date: 10/04/2022 CLINICAL DATA:  Recent fall from bed with headaches and neck pain, initial encounter EXAM: CT HEAD WITHOUT CONTRAST CT CERVICAL SPINE WITHOUT CONTRAST TECHNIQUE: Multidetector CT imaging of the head and cervical spine was performed following the standard protocol without intravenous contrast. Multiplanar CT image reconstructions of the cervical spine were also generated. RADIATION DOSE REDUCTION: This exam was performed according to the departmental dose-optimization program which includes automated exposure control, adjustment of the mA and/or kV according to patient size and/or use of iterative reconstruction technique. COMPARISON:  None Available. FINDINGS:  CT HEAD FINDINGS Brain: No evidence of acute infarction, hemorrhage, hydrocephalus, extra-axial collection or mass lesion/mass effect. Vascular: No hyperdense vessel or unexpected calcification. Skull: Normal. Negative for fracture or focal lesion. Sinuses/Orbits: Mucosal thickening is noted within the right maxillary antrum Other: None. CT CERVICAL SPINE FINDINGS Alignment: Within normal limits. Skull base and vertebrae: 7 cervical segments are well visualized. Vertebral body height is well maintained. Multilevel disc space narrowing and osteophytic changes are seen. Facet hypertrophic changes are noted. No acute fracture or acute facet abnormality is noted. The odontoid is within normal limits. Soft tissues and spinal canal: Surrounding soft tissue structures are within normal limits. Upper chest: Visualized lung apices are unremarkable. Other: None IMPRESSION: CT of the head: No acute intracranial abnormality noted. Mucosal thickening in the right maxillary antrum. CT of the cervical spine: Multilevel degenerative change without acute abnormality. Electronically Signed   By: Inez Catalina M.D.   On: 10/04/2022 22:50   CT Cervical Spine Wo Contrast  Result Date: 10/04/2022 CLINICAL DATA:  Recent fall from bed with headaches and neck pain, initial encounter EXAM: CT HEAD WITHOUT CONTRAST CT CERVICAL SPINE WITHOUT CONTRAST TECHNIQUE: Multidetector CT imaging of the head and cervical spine was performed following the standard protocol without intravenous contrast. Multiplanar CT image reconstructions of the cervical spine were also generated. RADIATION DOSE REDUCTION: This exam was performed according to the departmental dose-optimization program which includes automated exposure control, adjustment of the mA and/or kV according to patient size and/or use of iterative reconstruction technique. COMPARISON:  None Available. FINDINGS: CT HEAD FINDINGS Brain: No evidence of acute infarction, hemorrhage,  hydrocephalus, extra-axial collection or mass lesion/mass effect. Vascular: No hyperdense vessel or unexpected calcification. Skull: Normal. Negative for fracture or focal lesion. Sinuses/Orbits: Mucosal thickening is noted within the right maxillary antrum Other: None. CT CERVICAL SPINE FINDINGS Alignment: Within normal limits. Skull base and vertebrae: 7 cervical segments are well visualized. Vertebral body height is well maintained. Multilevel disc space narrowing and osteophytic changes are seen. Facet hypertrophic changes are noted. No acute fracture or acute facet abnormality is noted. The odontoid  is within normal limits. Soft tissues and spinal canal: Surrounding soft tissue structures are within normal limits. Upper chest: Visualized lung apices are unremarkable. Other: None IMPRESSION: CT of the head: No acute intracranial abnormality noted. Mucosal thickening in the right maxillary antrum. CT of the cervical spine: Multilevel degenerative change without acute abnormality. Electronically Signed   By: Inez Catalina M.D.   On: 10/04/2022 22:50   DG Hip Unilat W or Wo Pelvis 2-3 Views Right  Result Date: 10/04/2022 CLINICAL DATA:  [place], right hip pain EXAM: DG HIP (WITH OR WITHOUT PELVIS) 2-3V RIGHT COMPARISON:  None Available. FINDINGS: Frontal view of the pelvis as well as a frogleg lateral view of the right hip are obtained on 2 images. There are no acute displaced fractures. There is bilateral hip osteoarthritis, left greater than right. Soft tissues are unremarkable. Sacroiliac joints are normal. IMPRESSION: 1. Bilateral hip osteoarthritis, left greater than right. 2. No acute displaced fracture. Electronically Signed   By: Randa Ngo M.D.   On: 10/04/2022 21:36   US Venous Img Lower Unilateral Left  Result Date: 10/01/2022 CLINICAL DATA:  Left lower extremity pain and swelling, initial encounter EXAM: LEFT LOWER EXTREMITY VENOUS DOPPLER ULTRASOUND TECHNIQUE: Gray-scale sonography with  graded compression, as well as color Doppler and duplex ultrasound were performed to evaluate the lower extremity deep venous systems from the level of the common femoral vein and including the common femoral, femoral, profunda femoral, popliteal and calf veins including the posterior tibial, peroneal and gastrocnemius veins when visible. The superficial great saphenous vein was also interrogated. Spectral Doppler was utilized to evaluate flow at rest and with distal augmentation maneuvers in the common femoral, femoral and popliteal veins. COMPARISON:  None Available. FINDINGS: Contralateral Common Femoral Vein: Respiratory phasicity is normal and symmetric with the symptomatic side. No evidence of thrombus. Normal compressibility. Common Femoral Vein: No evidence of thrombus. Normal compressibility, respiratory phasicity and response to augmentation. Saphenofemoral Junction: No evidence of thrombus. Normal compressibility and flow on color Doppler imaging. Profunda Femoral Vein: No evidence of thrombus. Normal compressibility and flow on color Doppler imaging. Femoral Vein: No evidence of thrombus. Normal compressibility, respiratory phasicity and response to augmentation. Popliteal Vein: No evidence of thrombus. Normal compressibility, respiratory phasicity and response to augmentation. Calf Veins: No evidence of thrombus. Normal compressibility and flow on color Doppler imaging. Superficial Great Saphenous Vein: No evidence of thrombus. Normal compressibility. Venous Reflux:  None. Other Findings: Isolated thrombus is noted within the soleal vein of the left calf. IMPRESSION: Thrombus is noted within the soleal vein of the left calf. Electronically Signed   By: Inez Catalina M.D.   On: 10/01/2022 21:02   DG Knee Complete 4 Views Left  Result Date: 10/01/2022 CLINICAL DATA:  Left knee pain. EXAM: LEFT KNEE - COMPLETE 4+ VIEW COMPARISON:  May 23, 2022 FINDINGS: A left knee replacement is seen. There is no  evidence of surrounding lucency to suggest the presence of hardware loosening or infection. No evidence of an acute fracture or dislocation. A small joint effusion is seen. IMPRESSION: 1. 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 2. Small joint effusion. Electronically Signed   By: Virgina Norfolk M.D.   On: 10/01/2022 20:12    Microbiology: Results for orders placed or performed during the hospital encounter of 10/10/22  Resp Panel by RT-PCR (Flu A&B, Covid) Anterior Nasal Swab     Status: None   Collection Time: 10/10/22  9:30 PM   Specimen: Anterior Nasal Swab  Result Value Ref Range Status   SARS Coronavirus 2 by RT PCR NEGATIVE NEGATIVE Final    Comment: (NOTE) SARS-CoV-2 target nucleic acids are NOT DETECTED.  The SARS-CoV-2 RNA is generally detectable in upper respiratory specimens during the acute phase of infection. The lowest concentration of SARS-CoV-2 viral copies this assay can detect is 138 copies/mL. A negative result does not preclude SARS-Cov-2 infection and should not be used as the sole basis for treatment or other patient management decisions. A negative result may occur with  improper specimen collection/handling, submission of specimen other than nasopharyngeal swab, presence of viral mutation(s) within the areas targeted by this assay, and inadequate number of viral copies(<138 copies/mL). A negative result must be combined with clinical observations, patient history, and epidemiological information. The expected result is Negative.  Fact Sheet for Patients:  EntrepreneurPulse.com.au  Fact Sheet for Healthcare Providers:  IncredibleEmployment.be  This test is no t yet approved or cleared by the [place] FDA and  has been authorized for detection and/or diagnosis of SARS-CoV-2 by FDA under an Emergency Use Authorization (EUA). This EUA will remain  in effect (meaning this test can be used) for the  duration of the COVID-19 declaration under Section 564(b)(1) of the Act, 21 U.S.C.section 360bbb-3(b)(1), unless the authorization is terminated  or revoked sooner.       Influenza A by PCR NEGATIVE NEGATIVE Final   Influenza B by PCR NEGATIVE NEGATIVE Final    Comment: (NOTE) The Xpert Xpress SARS-CoV-2/FLU/RSV plus assay is intended as an aid in the diagnosis of influenza from Nasopharyngeal swab specimens and should not be used as a sole basis for treatment. Nasal washings and aspirates are unacceptable for Xpert Xpress SARS-CoV-2/FLU/RSV testing.  Fact Sheet for Patients: EntrepreneurPulse.com.au  Fact Sheet for Healthcare Providers: IncredibleEmployment.be  This test is not yet approved or cleared by the [place] FDA and has been authorized for detection and/or diagnosis of SARS-CoV-2 by FDA under an Emergency Use Authorization (EUA). This EUA will remain in effect (meaning this test can be used) for the duration of the COVID-19 declaration under Section 564(b)(1) of the Act, 21 U.S.C. section 360bbb-3(b)(1), unless the authorization is terminated or revoked.  Performed at [facility], [place] [address] [place], [place] [address]   Urine Culture     Status: Abnormal   Collection Time: 10/11/22  3:25 AM   Specimen: Urine, Clean Catch  Result Value Ref Range Status   Specimen Description URINE, CLEAN CATCH  Final   Special Requests   Final    NONE Performed at [facility], [place] [address], [place], [place] [address]    Culture 50,000 COLONIES/mL PSEUDOMONAS AERUGINOSA (A)  Final   Report Status 10/13/2022 FINAL  Final   Organism ID, Bacteria PSEUDOMONAS AERUGINOSA (A)  Final      Susceptibility   Pseudomonas aeruginosa - MIC*    CEFTAZIDIME <=1 SENSITIVE Sensitive     CIPROFLOXACIN <=0.25 SENSITIVE Sensitive     GENTAMICIN <=1 SENSITIVE Sensitive     IMIPENEM 1 SENSITIVE Sensitive     PIP/TAZO <=4 SENSITIVE  Sensitive     CEFEPIME 2 SENSITIVE Sensitive     * 50,000 COLONIES/mL PSEUDOMONAS AERUGINOSA  MRSA Next Gen by PCR, Nasal     Status: None   Collection Time: 10/11/22  9:46 PM   Specimen: Nasal Mucosa; Nasal Swab  Result Value Ref Range Status   MRSA by PCR Next Gen NOT DETECTED NOT DETECTED Final    Comment: (  NOTE) The GeneXpert MRSA Assay (FDA approved for NASAL specimens only), is one component of a comprehensive MRSA colonization surveillance program. It is not intended to diagnose MRSA infection nor to guide or monitor treatment for MRSA infections. Test performance is not FDA approved in patients less than 24 years old. Performed at [facility], [place] [address] [place], [place] [address]   Respiratory (~20 pathogens) panel by PCR     Status: None   Collection Time: 10/14/22 11:04 AM   Specimen: Nasopharyngeal Swab; Respiratory  Result Value Ref Range Status   Adenovirus NOT DETECTED NOT DETECTED Final   Coronavirus 229E NOT DETECTED NOT DETECTED Final    Comment: (NOTE) The Coronavirus on the Respiratory Panel, DOES NOT test for the novel  Coronavirus (2019 nCoV)    Coronavirus HKU1 NOT DETECTED NOT DETECTED Final   Coronavirus NL63 NOT DETECTED NOT DETECTED Final   Coronavirus OC43 NOT DETECTED NOT DETECTED Final   Metapneumovirus NOT DETECTED NOT DETECTED Final   Rhinovirus / Enterovirus NOT DETECTED NOT DETECTED Final   Influenza A NOT DETECTED NOT DETECTED Final   Influenza B NOT DETECTED NOT DETECTED Final   Parainfluenza Virus 1 NOT DETECTED NOT DETECTED Final   Parainfluenza Virus 2 NOT DETECTED NOT DETECTED Final   Parainfluenza Virus 3 NOT DETECTED NOT DETECTED Final   Parainfluenza Virus 4 NOT DETECTED NOT DETECTED Final   Respiratory Syncytial Virus NOT DETECTED NOT DETECTED Final   Bordetella pertussis NOT DETECTED NOT DETECTED Final   Bordetella Parapertussis NOT DETECTED NOT DETECTED Final   Chlamydophila pneumoniae NOT DETECTED NOT DETECTED Final    Mycoplasma pneumoniae NOT DETECTED NOT DETECTED Final    Comment: Performed at [facility], [place]. [address] [place], [place] [address]  Resp Panel by RT-PCR (Flu A&B, Covid) Anterior Nasal Swab     Status: None   Collection Time: 10/14/22 11:04 AM   Specimen: Anterior Nasal Swab  Result Value Ref Range Status   SARS Coronavirus 2 by RT PCR NEGATIVE NEGATIVE Final    Comment: (NOTE) SARS-CoV-2 target nucleic acids are NOT DETECTED.  The SARS-CoV-2 RNA is generally detectable in upper respiratory specimens during the acute phase of infection. The lowest concentration of SARS-CoV-2 viral copies this assay can detect is 138 copies/mL. A negative result does not preclude SARS-Cov-2 infection and should not be used as the sole basis for treatment or other patient management decisions. A negative result may occur with  improper specimen collection/handling, submission of specimen other than nasopharyngeal swab, presence of viral mutation(s) within the areas targeted by this assay, and inadequate number of viral copies(<138 copies/mL). A negative result must be combined with clinical observations, patient history, and epidemiological information. The expected result is Negative.  Fact Sheet for Patients:  EntrepreneurPulse.com.au  Fact Sheet for Healthcare Providers:  IncredibleEmployment.be  This test is no t yet approved or cleared by the [place] FDA and  has been authorized for detection and/or diagnosis of SARS-CoV-2 by FDA under an Emergency Use Authorization (EUA). This EUA will remain  in effect (meaning this test can be used) for the duration of the COVID-19 declaration under Section 564(b)(1) of the Act, 21 U.S.C.section 360bbb-3(b)(1), unless the authorization is terminated  or revoked sooner.       Influenza A by PCR NEGATIVE NEGATIVE Final   Influenza B by PCR NEGATIVE NEGATIVE Final    Comment: (NOTE) The Xpert Xpress  SARS-CoV-2/FLU/RSV plus assay is intended as an aid in the diagnosis  of influenza from Nasopharyngeal swab specimens and should not be used as a sole basis for treatment. Nasal washings and aspirates are unacceptable for Xpert Xpress SARS-CoV-2/FLU/RSV testing.  Fact Sheet for Patients: EntrepreneurPulse.com.au  Fact Sheet for Healthcare Providers: IncredibleEmployment.be  This test is not yet approved or cleared by the [place] FDA and has been authorized for detection and/or diagnosis of SARS-CoV-2 by FDA under an Emergency Use Authorization (EUA). This EUA will remain in effect (meaning this test can be used) for the duration of the COVID-19 declaration under Section 564(b)(1) of the Act, 21 U.S.C. section 360bbb-3(b)(1), unless the authorization is terminated or revoked.  Performed at [facility], [place] [address] [place], [place] [address]     Labs: CBC: Recent Labs  Lab 10/10/22 2030 10/10/22 2125  WBC 8.7  --   NEUTROABS 5.0  --   HGB 13.3 13.6  HCT 40.6 40.0  MCV 92.9  --   PLT 244  --    Basic Metabolic Panel: Recent Labs  Lab 10/10/22 2030 10/10/22 2125 10/12/22 0049 10/14/22 1739  NA 139 137 136 136  K 4.1 4.1 3.9 4.1  CL 105 102 105 101  CO2 25  --  24 30  GLUCOSE 117* 110* 128* 151*  BUN '12 13 14 9  '$ CREATININE 0.98 1.00 0.79 0.93  CALCIUM 8.5*  --  8.7* 8.9   Liver Function Tests: Recent Labs  Lab 10/10/22 2030  AST 19  ALT 11  ALKPHOS 46  BILITOT 0.5  PROT 5.5*  ALBUMIN 3.1*   CBG: No results for input(s): "GLUCAP" in the last 168 hours.  Discharge time spent: greater than 30 minutes.  Signed: Patrecia Pour, MD [facility] 10/15/2022

## 2022-10-15 NOTE — TOC Transition Note (Signed)
Transition of Care [facility]) - CM/SW Discharge Note   Patient Details  Name: Wendy Carrillo MRN: [id number] Date of Birth: 1941-09-08  Transition of Care [facility] CM/SW Contact:  Coralee Pesa, [place] Phone Number: 10/15/2022, 11:33 AM   Clinical Narrative:    Pt to be transported to [facility] via [employer]. Nurse to call report to [phone] Rm# [phone]   Final next level of care: Skilled Nursing Facility Barriers to Discharge: Barriers Resolved   Patient Goals and CMS Choice Patient states their goals for this hospitalization and ongoing recovery are:: Pt states her goal is to go to [place] or go home. CMS Medicare.gov Compare Post Acute Care list provided to:: Patient Choice offered to / list presented to : Patient  Discharge Placement              Patient chooses bed at: [name] Patient to be transferred to facility by: [facility] Name of family member notified: Patient/ Daughter Patient and family notified of of transfer: 10/15/22  Discharge Plan and Services     Post Acute Care Choice: [place]                               Social Determinants of Health (SDOH) Interventions     Readmission Risk Interventions     No data to display

## 2022-12-22 ENCOUNTER — Other Ambulatory Visit: Payer: Self-pay | Admitting: Surgical

## 2023-01-18 ENCOUNTER — Encounter (HOSPITAL_BASED_OUTPATIENT_CLINIC_OR_DEPARTMENT_OTHER): Payer: Self-pay | Admitting: Emergency Medicine

## 2023-01-18 ENCOUNTER — Emergency Department (HOSPITAL_BASED_OUTPATIENT_CLINIC_OR_DEPARTMENT_OTHER)
Admission: EM | Admit: 2023-01-18 | Discharge: 2023-01-18 | Disposition: A | Discharge status: Home / Self Care | Payer: Medicare PPO | Attending: Emergency Medicine | Admitting: Emergency Medicine

## 2023-01-18 ENCOUNTER — Other Ambulatory Visit (HOSPITAL_BASED_OUTPATIENT_CLINIC_OR_DEPARTMENT_OTHER): Payer: Self-pay

## 2023-01-18 ENCOUNTER — Other Ambulatory Visit: Payer: Self-pay

## 2023-01-18 ENCOUNTER — Emergency Department (HOSPITAL_BASED_OUTPATIENT_CLINIC_OR_DEPARTMENT_OTHER): Payer: Medicare PPO

## 2023-01-18 DIAGNOSIS — Z91040 Latex allergy status: Secondary | ICD-10-CM | POA: Diagnosis not present

## 2023-01-18 DIAGNOSIS — Z7901 Long term (current) use of anticoagulants: Secondary | ICD-10-CM | POA: Insufficient documentation

## 2023-01-18 DIAGNOSIS — N3091 Cystitis, unspecified with hematuria: Secondary | ICD-10-CM | POA: Diagnosis not present

## 2023-01-18 DIAGNOSIS — R319 Hematuria, unspecified: Secondary | ICD-10-CM | POA: Diagnosis present

## 2023-01-18 LAB — BASIC METABOLIC PANEL
Anion gap: 6 (ref 5–15)
BUN: 10 mg/dL (ref 8–23)
CO2: 27 mmol/L (ref 22–32)
Calcium: 8.3 mg/dL — ABNORMAL LOW (ref 8.9–10.3)
Chloride: 105 mmol/L (ref 98–111)
Creatinine, Ser: 0.77 mg/dL (ref 0.44–1.00)
GFR, Estimated: >60 mL/min (ref >60)
Glucose, Bld: 94 mg/dL (ref 70–99)
Potassium: 3.5 mmol/L (ref 3.5–5.1)
Sodium: 138 mmol/L (ref 135–145)

## 2023-01-18 LAB — CBC
HCT: 39.5 % (ref 36.0–46.0)
Hemoglobin: 12.7 g/dL (ref 12.0–15.0)
MCH: 28.9 pg (ref 26.0–34.0)
MCHC: 32.2 g/dL (ref 30.0–36.0)
MCV: 90 fL (ref 80.0–100.0)
Platelets: 202 10*3/uL (ref 150–400)
RBC: 4.39 MIL/uL (ref 3.87–5.11)
RDW: 12.8 % (ref 11.5–15.5)
WBC: 5 10*3/uL (ref 4.0–10.5)
nRBC: 0 % (ref 0.0–0.2)

## 2023-01-18 LAB — URINALYSIS, ROUTINE W REFLEX MICROSCOPIC
Glucose, UA: NEGATIVE mg/dL
Ketones, ur: NEGATIVE mg/dL
Nitrite: NEGATIVE
Protein, ur: 100 mg/dL — AB
Specific Gravity, Urine: 1.025 (ref 1.005–1.030)
pH: 5.5 (ref 5.0–8.0)

## 2023-01-18 LAB — URINALYSIS, W/ REFLEX TO CULTURE (INFECTION SUSPECTED)
Glucose, UA: NEGATIVE mg/dL
Ketones, ur: NEGATIVE mg/dL
Nitrite: NEGATIVE
Protein, ur: 100 mg/dL — AB
RBC / HPF: >50 RBC/hpf (ref 0–5)
Specific Gravity, Urine: 1.025 (ref 1.005–1.030)
WBC, UA: >50 WBC/hpf (ref 0–5)
pH: 5.5 (ref 5.0–8.0)

## 2023-01-18 LAB — URINALYSIS, MICROSCOPIC (REFLEX)
RBC / HPF: >50 RBC/hpf (ref 0–5)
WBC, UA: >50 WBC/hpf (ref 0–5)

## 2023-01-18 MED ORDER — MORPHINE SULFATE (PF) 4 MG/ML IV SOLN
4.0000 mg | Freq: Once | INTRAVENOUS | Status: AC
  Administered 2023-01-18: 4 mg via INTRAVENOUS
  Filled 2023-01-18: qty 1

## 2023-01-18 MED ORDER — SODIUM CHLORIDE 0.9 % IV SOLN
1.0000 g | Freq: Once | INTRAVENOUS | Status: AC
  Administered 2023-01-18: 1 g via INTRAVENOUS
  Filled 2023-01-18: qty 10

## 2023-01-18 MED ORDER — ONDANSETRON HCL 4 MG/2ML IJ SOLN
4.0000 mg | Freq: Once | INTRAMUSCULAR | Status: AC
  Administered 2023-01-18: 4 mg via INTRAVENOUS
  Filled 2023-01-18: qty 2

## 2023-01-18 MED ORDER — CEFUROXIME AXETIL 500 MG PO TABS
500.0000 mg | ORAL_TABLET | Freq: Two times a day (BID) | ORAL | 0 refills | Status: AC
  Filled 2023-01-18: qty 14, 7d supply, fill #0

## 2023-01-18 NOTE — ED Provider Notes (Signed)
[facility] [facility] Provider Note   CSN: [id number] Arrival date & time: 01/18/23  0841     History  Chief Complaint  Patient presents with   Hematuria    Wendy Carrillo is a 82 y.o. female.   Hematuria   Patient presented to the ED for evaluation of blood in her urine.  Patient states she noticed it a couple of days ago.  Initially she just thought her urine was dark in color.  Today it did appear more bloody so she came to the ED for evaluation.  She has been urinating more frequently, 4 times this morning.  She does have discomfort when she urinates.  Patient also is having some pain in the left flank area.  He is nauseated but has not vomited.  She denies any fevers.  Patient does have history of prior UTIs.  She denies any vaginal bleeding    Home Medications Prior to Admission medications   Medication Sig Start Date End Date Taking? Authorizing Provider  ALPRAZolam (XANAX) 0.25 MG tablet Take 1 tablet (0.25 mg total) by mouth daily as needed for anxiety. 10/15/22   Patrecia Pour, MD  apixaban (ELIQUIS) 5 MG TABS tablet Take 1 tablet (5 mg total) by mouth 2 (two) times daily. Patient not taking: Reported on 10/11/2022 10/02/22   Dion Saucier A, PA  cyanocobalamin (VITAMIN B12) 1000 MCG tablet Take 1 tablet (1,000 mcg total) by mouth daily. 10/15/22   Patrecia Pour, MD  esomeprazole (NEXIUM) 40 MG capsule Take 40 mg by mouth daily. 09/13/22   [provider]  fluticasone (FLONASE) 50 MCG/ACT nasal spray Place 1 spray into both nostrils daily as needed for allergies or rhinitis. 10/15/22   Patrecia Pour, MD  furosemide (LASIX) 20 MG tablet Take 20 mg by mouth daily as needed for fluid.    [provider]  gabapentin (NEURONTIN) 300 MG capsule TAKE 2 CAPSULES BY MOUTH THREE TIMES PER DAY. CAN SLOWLY INCREASE FROM CURRENT PRESCRIPTION. Patient taking differently: Take 600 mg by mouth daily as needed (for pain). Can slowly  increase from current prescription. 10/05/22   Lorine Bears, NP  hydrOXYzine (ATARAX) 10 MG tablet One tab every 12 hours as needed for itching Patient taking differently: Take 10 mg by mouth daily as needed for itching. 03/25/22   Glendale Chard, MD  loratadine (CLARITIN) 10 MG tablet Take 10 mg by mouth daily as needed for allergies. In the morning    [provider]  methocarbamol (ROBAXIN) 500 MG tablet Take 1 tablet (500 mg total) by mouth every 8 (eight) hours as needed for muscle spasms. 12/31/22   Magnant, Gerrianne Scale, PA-C  Multiple Vitamins-Minerals (AIRBORNE GUMMIES PO) Take 2 tablets by mouth daily.    [provider]  traMADol (ULTRAM) 50 MG tablet Take 1 tablet (50 mg total) by mouth every 12 (twelve) hours as needed for moderate pain or severe pain. 10/15/22   Patrecia Pour, MD  Vitamin D, Ergocalciferol, (DRISDOL) 50000 UNITS CAPS capsule Take 50,000 Units by mouth every Thursday.    [provider]      Allergies    Contrast media [iodinated contrast media], Penicillins, Lactose intolerance (gi), Baclofen, Latex, and Other    Review of Systems   Review of Systems  Genitourinary:  Positive for hematuria.    Physical Exam Updated Vital Signs BP 132/63   Pulse 64   Temp 98.5 F (36.9 C) (Oral)  Resp 14   Wt 98 kg   SpO2 97%   BMI 37.08 kg/m  Physical Exam Vitals and nursing note reviewed.  Constitutional:      Appearance: She is well-developed. She is not diaphoretic.  HENT:     Head: Normocephalic and atraumatic.     Right Ear: External ear normal.     Left Ear: External ear normal.  Eyes:     General: No scleral icterus.       Right eye: No discharge.        Left eye: No discharge.     Conjunctiva/sclera: Conjunctivae normal.  Neck:     Trachea: No tracheal deviation.  Cardiovascular:     Rate and Rhythm: Normal rate and regular rhythm.  Pulmonary:     Effort: Pulmonary effort is normal. No respiratory distress.     Breath  sounds: Normal breath sounds. No stridor. No wheezing or rales.  Abdominal:     General: Bowel sounds are normal. There is no distension.     Palpations: Abdomen is soft.     Tenderness: There is abdominal tenderness. There is left CVA tenderness. There is no guarding or rebound.     Comments: Tenderness to palpation suprapubic region  Musculoskeletal:        General: No tenderness or deformity.     Cervical back: Neck supple.  Skin:    General: Skin is warm and dry.     Findings: No rash.  Neurological:     General: No focal deficit present.     Mental Status: She is alert.     Cranial Nerves: No cranial nerve deficit, dysarthria or facial asymmetry.     Sensory: No sensory deficit.     Motor: No abnormal muscle tone or seizure activity.     Coordination: Coordination normal.  Psychiatric:        Mood and Affect: Mood normal.     ED Results / Procedures / Treatments   Labs (all labs ordered are listed, but only abnormal results are displayed) Labs Reviewed  URINALYSIS, ROUTINE W REFLEX MICROSCOPIC  CBC  BASIC METABOLIC PANEL  URINALYSIS, W/ REFLEX TO CULTURE (INFECTION SUSPECTED)    EKG None  Radiology No results found.  Procedures Procedures    Medications Ordered in ED Medications  morphine (PF) 4 MG/ML injection 4 mg (has no administration in time range)  ondansetron (ZOFRAN) injection 4 mg (has no administration in time range)    ED Course/ Medical Decision Making/ A&P Clinical Course as of 01/18/23 1030  Fri Jan 18, 2023  1008 Urinalysis, Routine w reflex microscopic -Urine, Clean Catch(!) Urinalysis suggestive of infection.  CBC and metabolic panel normal [JK]  1018 CT scan without acute abnormalities.  No evidence of ureteral stone [JK]  1029 Penicillin allergy noted.  Patient previously was given Rocephin according to the medical records.  IV Rocephin being administered today no signs of allergic reaction [JK]    Clinical Course User Index [JK]  Dorie Rank, MD                             Medical Decision Making Problems Addressed: Hemorrhagic cystitis: acute illness or injury that poses a threat to life or bodily functions  Amount and/or Complexity of Data Reviewed Labs: ordered. Decision-making details documented in ED Course. Radiology: ordered and independent interpretation performed.  Risk Prescription drug management.   Patient presented to ER for evaluation of blood in  her urine.  Patient also had urinary symptoms suggestive of possible infection.  Urinalysis does suggest infection.  Presentation consistent with a hemorrhagic cystitis.  No evidence of anemia or acute kidney injury.  CT scan also does not show any evidence of obstruction or ureteral stone.  Patient is afebrile nontoxic.  No signs of systemic infection.  She was given a dose of IV Rocephin in the ED.  Will discharge home with prescription for antibiotics and discussed outpatient follow-up.  Warning signs and precautions discussed.        Final Clinical Impression(s) / ED Diagnoses Final diagnoses:  None    Rx / DC Orders ED Discharge Orders     None         Dorie Rank, MD 01/18/23 1031

## 2023-01-18 NOTE — Discharge Instructions (Addendum)
Take the antibiotics as prescribed.  Follow-up with your doctor to be rechecked.  The blood in the urine should resolve as the infection is treated.  Return to the ER if you start having fevers weakness or other concerning symptoms

## 2023-01-18 NOTE — ED Triage Notes (Signed)
Reports blood every time she void , on Eliquis . Gl abd pain . Also reports dysuria . Hx UTIs

## 2023-01-19 LAB — URINE CULTURE: Culture: <10000 — AB

## 2023-03-13 ENCOUNTER — Ambulatory Visit: Payer: Medicare PPO | Admitting: Orthopedic Surgery

## 2023-03-13 ENCOUNTER — Other Ambulatory Visit (INDEPENDENT_AMBULATORY_CARE_PROVIDER_SITE_OTHER): Payer: Medicare PPO

## 2023-03-13 ENCOUNTER — Encounter: Payer: Self-pay | Admitting: Orthopedic Surgery

## 2023-03-13 DIAGNOSIS — M25561 Pain in right knee: Secondary | ICD-10-CM

## 2023-03-13 NOTE — Progress Notes (Signed)
Office Visit Note   Patient: Wendy Carrillo           Date of Birth: May 27, 1941           MRN: [id number] Visit Date: 03/13/2023 Requested by: Ralene Ok, MD [id number] [place],  [place] [address] PCP: Ralene Ok, MD  Subjective: Chief Complaint  Patient presents with   Right Knee - Pain    HPI: Wendy Carrillo is a 82 y.o. female who presents to the office reporting right knee and leg pain.  Acute onset about 1 week ago.  Ambulating with a cane because of this pain.  Wakes her from sleep at night.  No history of gout.  Reports some mild popping in the knee.  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  She still is on blood thinners for that but may come off at the end of this month.  Describes some occasional chills.  She can only walk less than 1 city block primarily because of leg weakness and knee pain.  Having a little left knee pain as well and heel pain on that side.  She states that when she is going up and down steps she "just about falls".  Takes tramadol for symptoms.  Did have some ablation for facet joint arthritis in the past.  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                ROS: All systems reviewed are negative as they relate to the chief complaint within the history of present illness.  Patient denies fevers or chills.  Assessment & Plan: Visit Diagnoses:  1. Right knee pain, unspecified chronicity     Plan: Impression is bilateral knee pain right worse than left.  She also is having some leg weakness and really her symptoms are more consistent with a back source of pain as opposed to knee source.  She has excellent range of motion in the right knee with no warmth no effusion and no radiographic changes.  Do not think this is infection or loosening.  Bone scan was negative for right-sided knee replacement loosening slightly over a year ago.  Favor referral to [facility]  imaging in early June for epidural steroid injection to localize specifically to that L3-4 foraminal region.  Follow-Up Instructions: No follow-ups on file.   Orders:  Orders Placed This Encounter  Procedures   XR KNEE 3 VIEW RIGHT   No orders of the defined types were placed in this encounter.     Procedures: No procedures performed   Clinical Data: No additional findings.  Objective: Vital Signs: There were no vitals taken for this visit.  Physical Exam:  Constitutional: Patient appears well-developed HEENT:  Head: Normocephalic Eyes:EOM are normal Neck: Normal range of motion Cardiovascular: Normal rate Pulmonary/chest: Effort normal Neurologic: Patient is alert Skin: Skin is warm Psychiatric: Patient has normal mood and affect  Ortho Exam: Ortho exam demonstrates 5 out of 5 ankle dorsiflexion plantarflexion quad hamstring strength.  Hip flexion strength is about 5- out of 5 bilaterally.  No groin pain bilaterally with internal and external rotation.  On the right knee she has excellent range of motion from 0 to about 115.  Good stability to varus valgus stress at 0 30 and 90 degrees.  Extensor mechanism intact.  No effusion or warmth in that right knee.  No definite paresthesias L1 S1 bilaterally.  Specialty Comments:  No specialty comments available.  Imaging: XR KNEE 3 VIEW RIGHT  Result Date: 03/13/2023 AP lateral merchant radiographs right knee reviewed.  Cemented total knee prosthesis in slight varus alignment.  No lucencies around the bone cement interface.  No change in position compared to radiographs from 2 years ago.  No fracture.    PMFS History: Patient Active Problem List   Diagnosis Date Noted   Pleuritic chest pain 10/11/2022   Pyuria 10/11/2022   Hypotension 10/11/2022   Spinal stenosis at L4-L5 level 05/18/2019   Benign essential HTN    History of total bilateral knee replacement (TKR)    Acute blood loss anemia    Post-operative pain     Spinal stenosis of lumbar region 06/09/2018   Spinal stenosis, lumbar    Hypertension    History of duodenal ulcer    Hepatitis    Gastritis    Family history of adverse reaction to anesthesia    Chronic lower back pain    Arthritis    Coronary artery calcification 10/21/2017   Combined forms of age-related cataract of right eye 04/18/2017   Status post total bilateral knee replacement 06/08/2016   Left lumbar radiculopathy 05/09/2016   Bilateral shoulder pain 03/22/2016   DDD (degenerative disc disease), cervical 03/22/2016   Morbid obesity with BMI of 40.0-44.9, adult (HCC) 03/08/2016   Spondylolisthesis, 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/2016   Neck pain 03/08/2016   Lumbar stenosis 03/08/2016   Cervical spinal stenosis 03/08/2016   Back pain 11/16/2015   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/10/2015   Angina pectoris (HCC) 04/20/2015   Morbid obesity (HCC) 04/20/2015   Dyspnea 04/20/2015   Atypical chest pain 04/20/2015   Esophageal reflux 02/23/2015   Essential hypertension 02/23/2015   Chronic back pain 02/23/2015   Chest pain 02/22/2015   Abdominal tenderness, LLQ (left lower quadrant) 06/15/2014   Acute sinusitis 06/15/2014   Allergic rhinitis due to pollen 06/15/2014   Bladder disorder 06/15/2014   Epistaxis 06/15/2014   Female stress incontinence 06/15/2014   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   Insomnia 06/15/2014   Localized primary osteoarthritis of wrist 06/15/2014   Lumbosacral spondylosis 06/15/2014   Osteoarthrosis, localized, primary, hand 06/15/2014   Referred otalgia 06/15/2014   Tingling 06/15/2014   Tinnitus 06/15/2014   Urinary incontinence 06/15/2014   Cystocele 05/04/2014   Retention of urine 05/04/2014   Cystocele, lateral 04/12/2014   Rectocele 03/17/2014   History of blood transfusion 11/05/1990   Past Medical History:  Diagnosis Date   Arthritis    "all over"   Cervicalgia    Chronic low back pain    Chronic lower back pain    Coronary artery calcification     cardiology--- dr t. 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 , does not remember  what type   Hypertension    followed by pcp   Spinal stenosis, lumbar    Ulnar neuropathy of right upper extremity    Wears glasses    Wears partial dentures    upper    Family History  Problem Relation Age of Onset  Hypertension Mother    Stroke Mother    Diabetes Mother    Heart attack Father    Breast cancer Sister    Leukemia Brother    Breast cancer Sister    Breast cancer Sister    Heart disease Brother     Past Surgical History:  Procedure Laterality Date   ABDOMINAL HERNIA REPAIR  1980s   ANTERIOR AND POSTERIOR VAGINAL REPAIR  2015   W/  MID-URETHRAL SLING AND SACROSPINOUS FIXATION   BACK SURGERY     BREAST CYST EXCISION Bilateral    BREAST SURGERY Left    "nipple taken off"   CARPAL TUNNEL RELEASE Bilateral right 2010;  left 2012   CATARACT EXTRACTION W/ INTRAOCULAR LENS IMPLANT Left 2014   CATARACT EXTRACTION W/ INTRAOCULAR LENS IMPLANT Bilateral right 2018;  left ?   CHOLECYSTECTOMY OPEN  1980's   AND APPENDECTOMY   COLONOSCOPY W/ BIOPSIES AND POLYPECTOMY  last one 01/ 2017   DILATION AND CURETTAGE OF UTERUS  "several"   HERNIA REPAIR     LEFT HEART CATH AND CORONARY ANGIOGRAPHY N/A 10/23/2017   Procedure: LEFT HEART CATH AND CORONARY ANGIOGRAPHY;  Surgeon: Lennette Bihari, MD;  Location: [facility];  Service: Cardiovascular;  Laterality: N/A;   LUMBAR LAMINECTOMY/DECOMPRESSION MICRODISCECTOMY Bilateral 06/09/2018   Procedure: Laminectomy and Foraminotomy - Lumbar Two,Lumbar Three - Lumbar One- Two - Lumbar  Three-Four- bilateral;  Surgeon: Donalee Citrin, MD;  Location: [facility] OR;  Service: Neurosurgery;  Laterality: Bilateral;   LUMBAR LAMINECTOMY/DECOMPRESSION MICRODISCECTOMY Bilateral 06/01/2020   Procedure: Laminectomy and Foraminotomy - left - Lumbar Two-Lumbar Three -  - bilateral - Lumbar Four-Lumbar Five;  Surgeon: Donalee Citrin, MD;  Location: [facility] OR;  Service: Neurosurgery;  Laterality: Bilateral;  Laminectomy and Foraminotomy - left - Lumbar Two-Lumbar Three -  - bilateral - Lumbar Four-Lumbar Five    LYMPH NODE BIOPSY Left 11/28/2020   Procedure: LEFT GROIN LYMPH NODE EXCISIONAL BIOPSY;  Surgeon: Sheliah Hatch, De Blanch, MD;  Location: [facility];  Service: General;  Laterality: Left;   MULTIPLE TOOTH EXTRACTIONS     PATELLA FRACTURE SURGERY Left 1990s   "after the replacement"   REDUCTION MAMMAPLASTY Bilateral 2004   TOTAL KNEE ARTHROPLASTY Bilateral left 1992;  right 2001   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

## 2023-03-14 ENCOUNTER — Encounter (HOSPITAL_BASED_OUTPATIENT_CLINIC_OR_DEPARTMENT_OTHER): Payer: Self-pay

## 2023-03-14 ENCOUNTER — Other Ambulatory Visit: Payer: Self-pay

## 2023-03-14 ENCOUNTER — Emergency Department (HOSPITAL_BASED_OUTPATIENT_CLINIC_OR_DEPARTMENT_OTHER)
Admission: EM | Admit: 2023-03-14 | Discharge: 2023-03-14 | Disposition: A | Discharge status: Home / Self Care | Payer: Medicare PPO | Attending: Emergency Medicine | Admitting: Emergency Medicine

## 2023-03-14 DIAGNOSIS — B3741 Candidal cystitis and urethritis: Secondary | ICD-10-CM | POA: Insufficient documentation

## 2023-03-14 DIAGNOSIS — R630 Anorexia: Secondary | ICD-10-CM | POA: Insufficient documentation

## 2023-03-14 DIAGNOSIS — Z7901 Long term (current) use of anticoagulants: Secondary | ICD-10-CM | POA: Diagnosis not present

## 2023-03-14 DIAGNOSIS — R319 Hematuria, unspecified: Secondary | ICD-10-CM | POA: Diagnosis present

## 2023-03-14 DIAGNOSIS — N3001 Acute cystitis with hematuria: Secondary | ICD-10-CM | POA: Insufficient documentation

## 2023-03-14 DIAGNOSIS — Z91040 Latex allergy status: Secondary | ICD-10-CM | POA: Insufficient documentation

## 2023-03-14 DIAGNOSIS — R531 Weakness: Secondary | ICD-10-CM | POA: Insufficient documentation

## 2023-03-14 DIAGNOSIS — N3091 Cystitis, unspecified with hematuria: Secondary | ICD-10-CM

## 2023-03-14 LAB — CBC WITH DIFFERENTIAL/PLATELET
Abs Immature Granulocytes: 0.02 10*3/uL (ref 0.00–0.07)
Basophils Absolute: 0 10*3/uL (ref 0.0–0.1)
Basophils Relative: 0 %
Eosinophils Absolute: 0.1 10*3/uL (ref 0.0–0.5)
Eosinophils Relative: 2 %
HCT: 37.7 % (ref 36.0–46.0)
Hemoglobin: 12.2 g/dL (ref 12.0–15.0)
Immature Granulocytes: 0 %
Lymphocytes Relative: 23 %
Lymphs Abs: 1.5 10*3/uL (ref 0.7–4.0)
MCH: 29 pg (ref 26.0–34.0)
MCHC: 32.4 g/dL (ref 30.0–36.0)
MCV: 89.8 fL (ref 80.0–100.0)
Monocytes Absolute: 1 10*3/uL (ref 0.1–1.0)
Monocytes Relative: 14 %
Neutro Abs: 4 10*3/uL (ref 1.7–7.7)
Neutrophils Relative %: 61 %
Platelets: 184 10*3/uL (ref 150–400)
RBC: 4.2 MIL/uL (ref 3.87–5.11)
RDW: 13.6 % (ref 11.5–15.5)
WBC: 6.7 10*3/uL (ref 4.0–10.5)
nRBC: 0 % (ref 0.0–0.2)

## 2023-03-14 LAB — BASIC METABOLIC PANEL
Anion gap: 8 (ref 5–15)
BUN: 9 mg/dL (ref 8–23)
CO2: 26 mmol/L (ref 22–32)
Calcium: 8.6 mg/dL — ABNORMAL LOW (ref 8.9–10.3)
Chloride: 103 mmol/L (ref 98–111)
Creatinine, Ser: 1.05 mg/dL — ABNORMAL HIGH (ref 0.44–1.00)
GFR, Estimated: 53 mL/min — ABNORMAL LOW (ref >60)
Glucose, Bld: 100 mg/dL — ABNORMAL HIGH (ref 70–99)
Potassium: 3.5 mmol/L (ref 3.5–5.1)
Sodium: 137 mmol/L (ref 135–145)

## 2023-03-14 LAB — URINALYSIS, MICROSCOPIC (REFLEX): RBC / HPF: >50 RBC/hpf (ref 0–5)

## 2023-03-14 LAB — URINALYSIS, ROUTINE W REFLEX MICROSCOPIC

## 2023-03-14 MED ORDER — FENTANYL CITRATE PF 50 MCG/ML IJ SOSY
50.0000 ug | PREFILLED_SYRINGE | Freq: Once | INTRAMUSCULAR | Status: AC
  Administered 2023-03-14: 50 ug via INTRAVENOUS
  Filled 2023-03-14: qty 1

## 2023-03-14 MED ORDER — CEPHALEXIN 500 MG PO CAPS: 500.0000 mg | ORAL_CAPSULE | Freq: Two times a day (BID) | ORAL | 0 refills | Status: AC

## 2023-03-14 MED ORDER — SODIUM CHLORIDE 0.9 % IV BOLUS
1000.0000 mL | Freq: Once | INTRAVENOUS | Status: AC
  Administered 2023-03-14: 1000 mL via INTRAVENOUS

## 2023-03-14 MED ORDER — SODIUM CHLORIDE 0.9 % IV SOLN
1.0000 g | Freq: Once | INTRAVENOUS | Status: AC
  Administered 2023-03-14: 1 g via INTRAVENOUS
  Filled 2023-03-14: qty 10

## 2023-03-14 MED ORDER — FLUCONAZOLE 100 MG PO TABS: 100.0000 mg | ORAL_TABLET | Freq: Every day | ORAL | 0 refills | Status: AC

## 2023-03-14 MED ORDER — ACETAMINOPHEN 325 MG PO TABS
650.0000 mg | ORAL_TABLET | Freq: Once | ORAL | Status: AC
  Administered 2023-03-14: 650 mg via ORAL
  Filled 2023-03-14: qty 2

## 2023-03-14 NOTE — ED Notes (Signed)
Lab notified Urine culture

## 2023-03-14 NOTE — ED Provider Notes (Signed)
[place] [facility] Provider Note   CSN: [id number] Arrival date & time: 03/14/23  4098     History  Chief Complaint  Patient presents with   Hematuria    Wendy Carrillo is a 82 y.o. female.  HPI 82 year old female presents with hematuria.  She has been dealing with dysuria and a UTI since late last week.  She has been having burning with urination which is similar to previous UTIs.  She is also having suprapubic abdominal pain.  She had a low-grade temperature of 99 but nothing above 100.  Was started on Bactrim 2 days ago and has 1 full day left.  However started having gross hematuria yesterday which has worsened.  This is similar to when she was here in March.  She feels like she is having a little bit of back pain as well.  She has taken Tylenol but has had no relief.  She is on Eliquis.  She has had decreased appetite and feels generally weak.  She feels like she is completely emptying her bladder with no retention symptoms.  Home Medications Prior to Admission medications   Medication Sig Start Date End Date Taking? Authorizing Provider  cephALEXin (KEFLEX) 500 MG capsule Take 1 capsule (500 mg total) by mouth 2 (two) times daily for 7 days. 03/14/23 03/21/23 Yes Pricilla Loveless, MD  fluconazole (DIFLUCAN) 100 MG tablet Take 1 tablet (100 mg total) by mouth daily for 14 days. 03/14/23 03/28/23 Yes Pricilla Loveless, MD  ALPRAZolam Prudy Feeler) 0.25 MG tablet Take 1 tablet (0.25 mg total) by mouth daily as needed for anxiety. 10/15/22   Tyrone Nine, MD  apixaban (ELIQUIS) 5 MG TABS tablet Take 1 tablet (5 mg total) by mouth 2 (two) times daily. Patient not taking: Reported on 10/11/2022 10/02/22   Sherian Maroon A, PA  cyanocobalamin (VITAMIN B12) 1000 MCG tablet Take 1 tablet (1,000 mcg total) by mouth daily. 10/15/22   Tyrone Nine, MD  esomeprazole (NEXIUM) 40 MG capsule Take 40 mg by mouth daily. 09/13/22   [provider]  fluticasone  (FLONASE) 50 MCG/ACT nasal spray Place 1 spray into both nostrils daily as needed for allergies or rhinitis. 10/15/22   Tyrone Nine, MD  furosemide (LASIX) 20 MG tablet Take 20 mg by mouth daily as needed for fluid.    [provider]  gabapentin (NEURONTIN) 300 MG capsule TAKE 2 CAPSULES BY MOUTH THREE TIMES PER DAY. CAN SLOWLY INCREASE FROM CURRENT PRESCRIPTION. Patient taking differently: Take 600 mg by mouth daily as needed (for pain). Can slowly increase from current prescription. 10/05/22   Juanda Chance, NP  hydrOXYzine (ATARAX) 10 MG tablet One tab every 12 hours as needed for itching Patient taking differently: Take 10 mg by mouth daily as needed for itching. 03/25/22   Dorothyann Peng, MD  loratadine (CLARITIN) 10 MG tablet Take 10 mg by mouth daily as needed for allergies. In the morning    [provider]  methocarbamol (ROBAXIN) 500 MG tablet Take 1 tablet (500 mg total) by mouth every 8 (eight) hours as needed for muscle spasms. 12/31/22   Magnant, Joycie Peek, PA-C  Multiple Vitamins-Minerals (AIRBORNE GUMMIES PO) Take 2 tablets by mouth daily.    [provider]  traMADol (ULTRAM) 50 MG tablet Take 1 tablet (50 mg total) by mouth every 12 (twelve) hours as needed for moderate pain or severe pain. 10/15/22   Tyrone Nine, MD  Vitamin D, Ergocalciferol, (  DRISDOL) 50000 UNITS CAPS capsule Take 50,000 Units by mouth every Thursday.    [provider]      Allergies    Contrast media [iodinated contrast media], Penicillins, Lactose intolerance (gi), Baclofen, Latex, and Other    Review of Systems   Review of Systems  Constitutional:  Negative for fever.  Gastrointestinal:  Positive for abdominal pain. Negative for vomiting.  Genitourinary:  Positive for dysuria and hematuria.  Musculoskeletal:  Positive for back pain.    Physical Exam Updated Vital Signs BP 132/68   Pulse 72   Temp (!) 97.4 F (36.3 C)   Resp 18   Ht 5\' 4"  (1.626 m)   Wt  95.3 kg   SpO2 95%   BMI 36.05 kg/m  Physical Exam Vitals and nursing note reviewed.  Constitutional:      General: She is not in acute distress.    Appearance: She is well-developed. She is not ill-appearing or diaphoretic.  HENT:     Head: Normocephalic and atraumatic.  Cardiovascular:     Rate and Rhythm: Normal rate and regular rhythm.     Heart sounds: Normal heart sounds.  Pulmonary:     Effort: Pulmonary effort is normal.     Breath sounds: Normal breath sounds.  Abdominal:     Palpations: Abdomen is soft.     Tenderness: There is abdominal tenderness in the suprapubic area. There is right CVA tenderness and left CVA tenderness.     Comments: Mild CVA tenderness  Skin:    General: Skin is warm and dry.  Neurological:     Mental Status: She is alert.     ED Results / Procedures / Treatments   Labs (all labs ordered are listed, but only abnormal results are displayed) Labs Reviewed  URINALYSIS, ROUTINE W REFLEX MICROSCOPIC - Abnormal; Notable for the following components:      Result Value   Color, Urine BROWN (*)    APPearance TURBID (*)    Glucose, UA   (*)    Value: TEST NOT REPORTED DUE TO COLOR INTERFERENCE OF URINE PIGMENT   Hgb urine dipstick   (*)    Value: TEST NOT REPORTED DUE TO COLOR INTERFERENCE OF URINE PIGMENT   Bilirubin Urine   (*)    Value: TEST NOT REPORTED DUE TO COLOR INTERFERENCE OF URINE PIGMENT   Ketones, ur   (*)    Value: TEST NOT REPORTED DUE TO COLOR INTERFERENCE OF URINE PIGMENT   Protein, ur   (*)    Value: TEST NOT REPORTED DUE TO COLOR INTERFERENCE OF URINE PIGMENT   Nitrite   (*)    Value: TEST NOT REPORTED DUE TO COLOR INTERFERENCE OF URINE PIGMENT   Leukocytes,Ua   (*)    Value: TEST NOT REPORTED DUE TO COLOR INTERFERENCE OF URINE PIGMENT   All other components within normal limits  URINALYSIS, MICROSCOPIC (REFLEX) - Abnormal; Notable for the following components:   Bacteria, UA FEW (*)    Non Squamous Epithelial PRESENT (*)     All other components within normal limits  BASIC METABOLIC PANEL - Abnormal; Notable for the following components:   Glucose, Bld 100 (*)    Creatinine, Ser 1.05 (*)    Calcium 8.6 (*)    GFR, Estimated 53 (*)    All other components within normal limits  URINE CULTURE  CBC WITH DIFFERENTIAL/PLATELET    EKG None  Radiology XR KNEE 3 VIEW RIGHT  Result Date: 03/13/2023 AP lateral merchant  radiographs right knee reviewed.  Cemented total knee prosthesis in slight varus alignment.  No lucencies around the bone cement interface.  No change in position compared to radiographs from 2 years ago.  No fracture.   Procedures Procedures    Medications Ordered in ED Medications  acetaminophen (TYLENOL) tablet 650 mg (650 mg Oral Given 03/14/23 0650)  cefTRIAXone (ROCEPHIN) 1 g in sodium chloride 0.9 % 100 mL IVPB (0 g Intravenous Stopped 03/14/23 1000)  fentaNYL (SUBLIMAZE) injection 50 mcg (50 mcg Intravenous Given 03/14/23 0806)  sodium chloride 0.9 % bolus 1,000 mL (0 mLs Intravenous Stopped 03/14/23 1000)    ED Course/ Medical Decision Making/ A&P                             Medical Decision Making Amount and/or Complexity of Data Reviewed Labs: ordered.    Details: Normal white blood cell count.  Slight bump in creatinine but overall unremarkable at 1.05.  Bloody urine with some yeast.  Risk Prescription drug management.   Patient seems to have hemorrhagic cystitis.  She is on Eliquis.  I reviewed her presentation from a couple months ago and she had an unremarkable CT scan.  I do not think a repeat is needed.  Her pain is primarily suprapubic and I think this goes along with her cystitis symptoms.  Her urinalysis is not overly impressive but she has been on antibiotics.  However there is yeast in her urine.  I think changing her antibiotics is reasonable as she has not felt any better with the Bactrim.  Will send for culture given complexity of patient and presentation.  At the  same time, there is yeast in her urine and is possible this is all yeast cystitis.  I discussed with ED pharmacist, Albin Felling, as this is complex and she is on Eliquis.  She is on Eliquis for a DVT that was in November and she tells me she was due to stop Eliquis later this month.  I discussed that patient has the option to stop Eliquis a little early but she should also discuss this with her PCP.  If she does not want to stop Eliquis given the interaction between fluconazole and Eliquis pharmacy is recommending taking 1/2 tablet twice daily and a lower dose of fluconazole (for her renal function which has a creatinine clearance around 60) of 100 mg daily for 2 weeks.  We discussed there is an increased risk of bleeding with this regimen even with cutting down on the Eliquis.  She will call her doctor to see if she should stay on the Eliquis.  Otherwise, we will switch her to Keflex as she has tolerated cephalosporins before.  With a normal white blood cell count and no fevers I think pyelonephritis is less likely.  Will give return precautions.       Final Clinical Impression(s) / ED Diagnoses Final diagnoses:  Hemorrhagic cystitis  Yeast cystitis    Rx / DC Orders ED Discharge Orders          Ordered    cephALEXin (KEFLEX) 500 MG capsule  2 times daily        03/14/23 0921    fluconazole (DIFLUCAN) 100 MG tablet  Daily        03/14/23 1610              Pricilla Loveless, MD 03/14/23 1015

## 2023-03-14 NOTE — Discharge Instructions (Addendum)
Your urine test shows there is yeast in your urine.  You will need to have treatment for about 2 weeks with an antifungal.  We discussed that this medicine can interact with Eliquis.  There are 2 options, 1 is to cut your Eliquis in half, taking 1/2 tablet in the morning and 1/2 tablet in the evening.  However there is probably still an increased chance of bleeding with this.  The other option is to talk to your doctor about potentially stopping the Eliquis.  You are nearly at the 46-month mark from your DVT.  That will be between you and your doctor.  We are also covering you with oral antibiotics and you will need to follow-up with urology and your primary care physician.  If you develop fever, vomiting, new or worsening pain, back or flank pain, or any other new/concerning symptoms then return to the ER or call 911.

## 2023-03-14 NOTE — ED Notes (Signed)
Reviewed discharge instructions with pt. Pt states understanding regarding Eliquis . Pt accompanied by family home

## 2023-03-14 NOTE — ED Triage Notes (Signed)
Pt reports she was dx with UTI on Tuesday and given a rx for bactrim. Pt has one pill left today. She started to notice her urine was bloody. Pt endorses pelvic discomfort above vagina. Worried about the blood. No fevers.

## 2023-03-15 LAB — URINE CULTURE

## 2023-04-18 ENCOUNTER — Other Ambulatory Visit: Payer: Self-pay | Admitting: Student

## 2023-04-18 DIAGNOSIS — M47816 Spondylosis without myelopathy or radiculopathy, lumbar region: Secondary | ICD-10-CM

## 2023-05-14 ENCOUNTER — Other Ambulatory Visit: Payer: Medicare PPO

## 2023-05-14 ENCOUNTER — Inpatient Hospital Stay
Admission: RE | Admit: 2023-05-14 | Discharge: 2023-05-14 | Disposition: A | Discharge status: Home / Self Care | Payer: Medicare PPO | Source: Ambulatory Visit | Attending: Student | Admitting: Student

## 2023-05-14 NOTE — Discharge Instructions (Signed)
Medial [facility] Discharge Instructions  Take over-the-counter and prescription medicines only as told by your health care provider.  Do not drive the day of your procedure  Return to your normal activities as told by your health care provider.   If injection site is sore you may ice the area for 20 minutes, 2-3 times a day.   Check your injection site every day for signs of infection. Check for: Redness, swelling, or pain. Fluid or blood. Warmth. Pus or a bad smell.  Please contact our office at [phone] if: You have a fever or chills. You have any signs of infection. You develop any numbness or weakness.   Thank you for visiting our office. Medial [facility] Discharge Instructions  Take over-the-counter and prescription medicines only as told by your health care provider.  Do not drive the day of your procedure  Return to your normal activities as told by your health care provider.   If injection site is sore you may ice the area for 20 minutes, 2-3 times a day.   Check your injection site every day for signs of infection. Check for: Redness, swelling, or pain. Fluid or blood. Warmth. Pus or a bad smell.  Please contact our office at [phone] if: You have a fever or chills. You have any signs of infection. You develop any numbness or weakness.   Thank you for visiting our office.  

## 2023-05-15 ENCOUNTER — Other Ambulatory Visit: Payer: Self-pay | Admitting: Student

## 2023-05-15 DIAGNOSIS — M47816 Spondylosis without myelopathy or radiculopathy, lumbar region: Secondary | ICD-10-CM

## 2023-05-16 ENCOUNTER — Other Ambulatory Visit: Payer: Self-pay | Admitting: Student

## 2023-05-16 ENCOUNTER — Ambulatory Visit
Admission: RE | Admit: 2023-05-16 | Discharge: 2023-05-16 | Disposition: A | Discharge status: Home / Self Care | Payer: Medicare PPO | Source: Ambulatory Visit | Attending: Student | Admitting: Student

## 2023-05-16 DIAGNOSIS — M47816 Spondylosis without myelopathy or radiculopathy, lumbar region: Secondary | ICD-10-CM

## 2023-05-16 NOTE — Discharge Instructions (Signed)
Medial [facility] Discharge Instructions  Take over-the-counter and prescription medicines only as told by your health care provider.  Do not drive the day of your procedure  Return to your normal activities as told by your health care provider.   If injection site is sore you may ice the area for 20 minutes, 2-3 times a day.   Check your injection site every day for signs of infection. Check for: Redness, swelling, or pain. Fluid or blood. Warmth. Pus or a bad smell.  Please contact our office at [phone] if: You have a fever or chills. You have any signs of infection. You develop any numbness or weakness.   Thank you for visiting our office.  

## 2023-05-23 ENCOUNTER — Encounter: Payer: Self-pay | Admitting: Student

## 2023-06-06 ENCOUNTER — Encounter: Payer: Self-pay | Admitting: Student

## 2023-06-13 NOTE — Discharge Instructions (Signed)
Radio Frequency Ablation Post Procedure Discharge Instructions ? ?May resume a regular diet and any medications that you routinely take (including pain medications). ?No driving day of procedure. ?Upon discharge go home and rest for at least 4 hours.  May use an ice pack as needed to injection sites on back. ?Remove bandades later, today. ? ? ? ?Please contact our office at [phone] for the following symptoms: ? ?Fever greater than 100 degrees ?Increased swelling, pain, or redness at injection site. ? ? ?Thank you for visiting [facility].  ?

## 2023-06-14 ENCOUNTER — Other Ambulatory Visit: Payer: Self-pay | Admitting: Student

## 2023-06-14 ENCOUNTER — Ambulatory Visit: Admission: RE | Admit: 2023-06-14 | Payer: Medicare PPO | Source: Ambulatory Visit

## 2023-06-14 DIAGNOSIS — M47816 Spondylosis without myelopathy or radiculopathy, lumbar region: Secondary | ICD-10-CM

## 2023-06-14 MED ORDER — MIDAZOLAM HCL 2 MG/2ML IJ SOLN
1.0000 mg | INTRAMUSCULAR | Status: DC | PRN
  Administered 2023-06-14 (×2): 1 mg via INTRAVENOUS

## 2023-06-14 MED ORDER — SODIUM CHLORIDE 0.9 % IV SOLN: INTRAVENOUS | Status: DC

## 2023-06-14 MED ORDER — FENTANYL CITRATE PF 50 MCG/ML IJ SOSY
25.0000 ug | PREFILLED_SYRINGE | INTRAMUSCULAR | Status: DC | PRN
  Administered 2023-06-14 (×2): 25 ug via INTRAVENOUS
  Administered 2023-06-14: 50 ug via INTRAVENOUS

## 2023-06-14 MED ORDER — ACETAMINOPHEN 10 MG/ML IV SOLN
1000.0000 mg | Freq: Once | INTRAVENOUS | Status: AC
  Administered 2023-06-14: 1000 mg via INTRAVENOUS

## 2023-06-14 NOTE — Progress Notes (Signed)
Pt back in nursing recovery area. Pt still drowsy from procedure but will wake up when spoken to. Pt follows commands, talks in complete sentences and has no s/s of distress. V/S and orders assessed and pt denies additional complaints at this time. Pt will remain in nursing station until discharge. Will continue to monitor and tx pt according to MD orders.

## 2023-09-03 ENCOUNTER — Other Ambulatory Visit (INDEPENDENT_AMBULATORY_CARE_PROVIDER_SITE_OTHER): Payer: Medicare PPO

## 2023-09-03 ENCOUNTER — Ambulatory Visit: Payer: Medicare PPO | Admitting: Physician Assistant

## 2023-09-03 ENCOUNTER — Encounter: Payer: Self-pay | Admitting: Physician Assistant

## 2023-09-03 ENCOUNTER — Other Ambulatory Visit (INDEPENDENT_AMBULATORY_CARE_PROVIDER_SITE_OTHER): Payer: Self-pay

## 2023-09-03 DIAGNOSIS — G8929 Other chronic pain: Secondary | ICD-10-CM | POA: Diagnosis not present

## 2023-09-03 DIAGNOSIS — M25552 Pain in left hip: Secondary | ICD-10-CM | POA: Diagnosis not present

## 2023-09-03 DIAGNOSIS — M5442 Lumbago with sciatica, left side: Secondary | ICD-10-CM

## 2023-09-03 NOTE — Progress Notes (Signed)
Office Visit Note   Patient: Wendy Carrillo           Date of Birth: [birth year]           MRN: [id number] Visit Date: 09/03/2023              Requested by: Ralene Ok, MD [address] [place],  [place] [address] PCP: Ralene Ok, MD  Chief Complaint  Patient presents with   Left Leg - Pain      HPI: Patient is a pleasant 82 year old woman who has multiple joint pain.  She has a history of bilateral knee replacements and multiple back surgeries.  She most recently had an ablation at L3-4 L4-5 the facet joints.  She comes in today with a chief complaint of left hip pain into the groin that radiates down to her knee.  She rates her pain as moderate she is using a cane no loss of bowel or bladder control she has had some subjective chills and hot flashes but no malaise  Assessment & Plan: Visit Diagnoses:  1. Pain in left hip   2. Chronic left-sided low back pain with left-sided sciatica     Plan: Patient today with low back pain radiating into her left buttock and down her leg.  She has had surgery with Dr. Wynetta Emery who follows her for her back.  He had referred her for this if she has a history of claudication and lumbar stenosis.  She has tried a steroid taper Dosepak but was ineffective.  She also takes tramadol chronically.  He had sent her to physical therapy.  She has done 2 sessions and cannot continue to do this.  Although she does have some arthritis in her hip findings today more consistent with her low back.  I think she needs to get into see Dr. Wynetta Emery as soon as possible.  She has been giving strict return precautions.  She understands to call me if she cannot get into see Dr. Wynetta Emery.  She is no longer on Eliquis so I will go forward and give her Toradol injection today she has had these in the past  Follow-Up Instructions: No follow-ups on file.   Ortho Exam  Patient is alert, oriented, no adenopathy, well-dressed, normal affect, normal respiratory effort. Examination  patient is obviously uncomfortable.  She ambulates with a cane.  She has tenderness over her lower spine with radiation into the left buttock.  Her strength is limited by pain.  She has paresthesias that go down below the knee she has compartments are soft and nontender no cellulitis or signs of infection she has a well-healed surgical incision She has no real pain in the groin no pain with manipulation of her left hip  Imaging: No results found. No images are attached to the encounter.  Labs: Lab Results  Component Value Date   ESRSEDRATE 2 08/02/2021   CRP 4.5 08/02/2021   REPTSTATUS 03/15/2023 FINAL 03/14/2023   CULT MULTIPLE SPECIES PRESENT, SUGGEST RECOLLECTION (A) 03/14/2023   LABORGA PSEUDOMONAS AERUGINOSA (A) 10/11/2022     Lab Results  Component Value Date   ALBUMIN 3.1 (L) 10/10/2022   ALBUMIN 3.5 10/04/2022   ALBUMIN 3.5 11/11/2021    No results found for: "MG" No results found for: "VD25OH"  No results found for: "PREALBUMIN"    Latest Ref Rng & Units 03/14/2023    6:52 AM 01/18/2023    9:19 AM 10/10/2022    9:25 PM  CBC EXTENDED  WBC 4.0 -  10.5 K/uL 6.7  5.0    RBC 3.87 - 5.11 MIL/uL 4.20  4.39    Hemoglobin 12.0 - 15.0 g/dL 08.6  57.8  46.9   HCT 36.0 - 46.0 % 37.7  39.5  40.0   Platelets 150 - 400 K/uL 184  202    NEUT# 1.7 - 7.7 K/uL 4.0     Lymph# 0.7 - 4.0 K/uL 1.5        There is no height or weight on file to calculate BMI.  Orders:  Orders Placed This Encounter  Procedures   XR HIP UNILAT W OR W/O PELVIS 2-3 VIEWS LEFT   XR Lumbar Spine 2-3 Views   No orders of the defined types were placed in this encounter.    Procedures: No procedures performed  Clinical Data: No additional findings.  ROS:  All other systems negative, except as noted in the HPI. Review of Systems  Objective: Vital Signs: There were no vitals taken for this visit.  Specialty Comments:  No specialty comments available.  PMFS History: Patient Active Problem  List   Diagnosis Date Noted   Pain in left hip 09/03/2023   Pleuritic chest pain 10/11/2022   Pyuria 10/11/2022   Hypotension 10/11/2022   Spinal stenosis at L4-L5 level 05/18/2019   Benign essential HTN    History of total bilateral knee replacement (TKR)    Acute blood loss anemia    Post-operative pain    Spinal stenosis of lumbar region 06/09/2018   Spinal stenosis, lumbar    Hypertension    History of duodenal ulcer    Hepatitis    Gastritis    Family history of adverse reaction to anesthesia    Chronic lower back pain    Arthritis    Coronary artery calcification 10/21/2017   Combined forms of age-related cataract of right eye 04/18/2017   Status post total bilateral knee replacement 06/08/2016   Left lumbar radiculopathy 05/09/2016   Bilateral shoulder pain 03/22/2016   DDD (degenerative disc disease), cervical 03/22/2016   Morbid obesity with BMI of 40.0-44.9, adult (HCC) 03/08/2016   Spondylolisthesis, 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/2016   Neck pain 03/08/2016   Lumbar stenosis 03/08/2016   Cervical spinal stenosis 03/08/2016   Back pain 11/16/2015   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/10/2015   Angina pectoris (HCC) 04/20/2015   Morbid obesity (HCC) 04/20/2015   Dyspnea 04/20/2015   Atypical chest pain 04/20/2015   Esophageal reflux 02/23/2015   Essential hypertension 02/23/2015   Chronic back pain 02/23/2015   Chest pain 02/22/2015   Abdominal tenderness, LLQ (left lower quadrant) 06/15/2014   Acute sinusitis 06/15/2014   Allergic rhinitis due to pollen 06/15/2014   Bladder disorder 06/15/2014   Epistaxis 06/15/2014   Female stress incontinence 06/15/2014   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   Insomnia 06/15/2014   Localized primary osteoarthritis of wrist 06/15/2014   Lumbosacral spondylosis 06/15/2014   Osteoarthrosis, localized, primary, hand 06/15/2014   Referred otalgia 06/15/2014   Tingling 06/15/2014   Tinnitus 06/15/2014   Urinary incontinence 06/15/2014    Cystocele 05/04/2014   Retention of urine 05/04/2014   Cystocele, lateral 04/12/2014   Rectocele 03/17/2014   History of blood transfusion 11/05/1990   Past Medical History:  Diagnosis Date   Arthritis    "all over"   Cervicalgia    Chronic low back pain    Chronic lower back pain    Coronary artery calcification    cardiology--- dr t. [facility]---  nuclear study in epic 05-06-2015 no ischedmia,  nuclear ef 51%;  cardiac cath 10-23-2017 mild coronary calcification in all 3V without significant obstructive disease   Enlarged lymph node    left goin   GERD (gastroesophageal reflux disease)    History of duodenal ulcer    History of gastritis    History of hepatitis    1960  per pt had finger stick by needle was  in nursing school , does not remember  what type   Hypertension    followed by pcp   Spinal stenosis, lumbar    Ulnar neuropathy of right upper extremity    Wears glasses    Wears partial dentures    upper    Family History  Problem Relation Age of Onset   Hypertension Mother    Stroke Mother    Diabetes Mother    Heart attack Father    Breast cancer Sister    Leukemia Brother    Breast cancer Sister    Breast cancer Sister    Heart disease Brother     Past Surgical History:  Procedure Laterality Date   ABDOMINAL HERNIA REPAIR  1980s   ANTERIOR AND POSTERIOR VAGINAL REPAIR  2015   W/  MID-URETHRAL SLING AND SACROSPINOUS FIXATION   BACK SURGERY     BREAST CYST EXCISION Bilateral    BREAST SURGERY Left    "nipple taken off"   CARPAL TUNNEL RELEASE Bilateral right 2010;  left 2012   CATARACT EXTRACTION W/ INTRAOCULAR LENS IMPLANT Left 2014   CATARACT EXTRACTION W/ INTRAOCULAR LENS IMPLANT Bilateral right 2018;  left ?   CHOLECYSTECTOMY OPEN  1980's   AND APPENDECTOMY   COLONOSCOPY W/ BIOPSIES AND POLYPECTOMY  last one 01/ 2017   DILATION AND CURETTAGE OF UTERUS  "several"   HERNIA REPAIR     LEFT HEART CATH AND CORONARY ANGIOGRAPHY N/A 10/23/2017    Procedure: LEFT HEART CATH AND CORONARY ANGIOGRAPHY;  Surgeon: Lennette Bihari, MD;  Location: [facility];  Service: Cardiovascular;  Laterality: N/A;   LUMBAR LAMINECTOMY/DECOMPRESSION MICRODISCECTOMY Bilateral 06/09/2018   Procedure: Laminectomy and Foraminotomy - Lumbar Two,Lumbar Three - Lumbar One- Two - Lumbar Three-Four- bilateral;  Surgeon: Donalee Citrin, MD;  Location: [facility] OR;  Service: Neurosurgery;  Laterality: Bilateral;   LUMBAR LAMINECTOMY/DECOMPRESSION MICRODISCECTOMY Bilateral 06/01/2020   Procedure: Laminectomy and Foraminotomy - left - Lumbar Two-Lumbar Three -  - bilateral - Lumbar Four-Lumbar Five;  Surgeon: Donalee Citrin, MD;  Location: [facility] OR;  Service: Neurosurgery;  Laterality: Bilateral;  Laminectomy and Foraminotomy - left - Lumbar Two-Lumbar Three -  - bilateral - Lumbar Four-Lumbar Five    LYMPH NODE BIOPSY Left 11/28/2020   Procedure: LEFT GROIN LYMPH NODE EXCISIONAL BIOPSY;  Surgeon: Sheliah Hatch, De Blanch, MD;  Location: [facility];  Service: General;  Laterality: Left;   MULTIPLE TOOTH EXTRACTIONS     PATELLA FRACTURE SURGERY Left 1990s   "after the replacement"   REDUCTION MAMMAPLASTY Bilateral 2004   TOTAL KNEE ARTHROPLASTY Bilateral left 1992;  right 2001   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

## 2023-09-09 ENCOUNTER — Telehealth: Payer: Self-pay

## 2023-09-09 ENCOUNTER — Encounter: Payer: Self-pay | Admitting: Neurological Surgery

## 2023-09-09 ENCOUNTER — Other Ambulatory Visit: Payer: Self-pay | Admitting: Neurological Surgery

## 2023-09-09 DIAGNOSIS — M47816 Spondylosis without myelopathy or radiculopathy, lumbar region: Secondary | ICD-10-CM

## 2023-09-09 MED ORDER — PREDNISONE 50 MG PO TABS: ORAL_TABLET | ORAL | 0 refills | Status: DC

## 2023-09-09 NOTE — Telephone Encounter (Signed)
Phone call to patient to review instructions for 13 hr prep for NRB on 09/10/23  at 0900. Prescription called into [facility]. Pt aware and verbalized understanding of instructions. Prescription: 09/09/23@2000 - 50mg  Prednisone 09/10/23@0200 - 50mg  Prednisone 09/10/23@0800 - 50mg  Prednisone and 50mg  Benadryl

## 2023-09-10 ENCOUNTER — Ambulatory Visit
Admission: RE | Admit: 2023-09-10 | Discharge: 2023-09-10 | Disposition: A | Discharge status: Home / Self Care | Payer: Medicare PPO | Source: Ambulatory Visit | Attending: Neurological Surgery | Admitting: Neurological Surgery

## 2023-09-10 ENCOUNTER — Other Ambulatory Visit: Payer: Self-pay | Admitting: Neurological Surgery

## 2023-09-10 ENCOUNTER — Other Ambulatory Visit: Payer: Medicare PPO

## 2023-09-10 DIAGNOSIS — M47816 Spondylosis without myelopathy or radiculopathy, lumbar region: Secondary | ICD-10-CM

## 2023-09-10 MED ORDER — METHYLPREDNISOLONE ACETATE 40 MG/ML INJ SUSP (RADIOLOG
80.0000 mg | Freq: Once | INTRAMUSCULAR | Status: AC
  Administered 2023-09-10: 120 mg via EPIDURAL

## 2023-09-10 MED ORDER — IOPAMIDOL (ISOVUE-M 200) INJECTION 41%
1.0000 mL | Freq: Once | INTRAMUSCULAR | Status: AC
Start: 2023-09-10 — End: 2023-09-10
  Administered 2023-09-10: 1 mL via EPIDURAL

## 2023-09-10 NOTE — Discharge Instructions (Signed)

## 2023-10-11 ENCOUNTER — Other Ambulatory Visit: Payer: Self-pay | Admitting: Student

## 2023-10-11 DIAGNOSIS — M47816 Spondylosis without myelopathy or radiculopathy, lumbar region: Secondary | ICD-10-CM

## 2023-10-18 ENCOUNTER — Emergency Department (HOSPITAL_BASED_OUTPATIENT_CLINIC_OR_DEPARTMENT_OTHER): Payer: Medicare PPO

## 2023-10-18 ENCOUNTER — Other Ambulatory Visit: Payer: Self-pay

## 2023-10-18 ENCOUNTER — Emergency Department (HOSPITAL_BASED_OUTPATIENT_CLINIC_OR_DEPARTMENT_OTHER)
Admission: EM | Admit: 2023-10-18 | Discharge: 2023-10-18 | Disposition: A | Discharge status: Home / Self Care | Payer: Medicare PPO | Attending: Emergency Medicine | Admitting: Emergency Medicine

## 2023-10-18 ENCOUNTER — Encounter (HOSPITAL_BASED_OUTPATIENT_CLINIC_OR_DEPARTMENT_OTHER): Payer: Self-pay

## 2023-10-18 DIAGNOSIS — R1012 Left upper quadrant pain: Secondary | ICD-10-CM | POA: Diagnosis present

## 2023-10-18 DIAGNOSIS — N10 Acute tubulo-interstitial nephritis: Secondary | ICD-10-CM | POA: Diagnosis not present

## 2023-10-18 DIAGNOSIS — I10 Essential (primary) hypertension: Secondary | ICD-10-CM | POA: Insufficient documentation

## 2023-10-18 DIAGNOSIS — Z91040 Latex allergy status: Secondary | ICD-10-CM | POA: Diagnosis not present

## 2023-10-18 LAB — COMPREHENSIVE METABOLIC PANEL
ALT: 11 U/L (ref 0–44)
AST: 17 U/L (ref 15–41)
Albumin: 3.6 g/dL (ref 3.5–5.0)
Alkaline Phosphatase: 47 U/L (ref 38–126)
Anion gap: 6 (ref 5–15)
BUN: 7 mg/dL — ABNORMAL LOW (ref 8–23)
CO2: 26 mmol/L (ref 22–32)
Calcium: 8.9 mg/dL (ref 8.9–10.3)
Chloride: 107 mmol/L (ref 98–111)
Creatinine, Ser: 0.73 mg/dL (ref 0.44–1.00)
GFR, Estimated: >60 mL/min (ref >60)
Glucose, Bld: 97 mg/dL (ref 70–99)
Potassium: 3.5 mmol/L (ref 3.5–5.1)
Sodium: 139 mmol/L (ref 135–145)
Total Bilirubin: 0.6 mg/dL (ref <1.2)
Total Protein: 6.3 g/dL — ABNORMAL LOW (ref 6.5–8.1)

## 2023-10-18 LAB — URINALYSIS, W/ REFLEX TO CULTURE (INFECTION SUSPECTED)
Bilirubin Urine: NEGATIVE
Glucose, UA: NEGATIVE mg/dL
Hgb urine dipstick: NEGATIVE
Ketones, ur: NEGATIVE mg/dL
Nitrite: NEGATIVE
Protein, ur: NEGATIVE mg/dL
Specific Gravity, Urine: 1.01 (ref 1.005–1.030)
pH: 6 (ref 5.0–8.0)

## 2023-10-18 LAB — CBC
HCT: 38.4 % (ref 36.0–46.0)
Hemoglobin: 12.2 g/dL (ref 12.0–15.0)
MCH: 29.5 pg (ref 26.0–34.0)
MCHC: 31.8 g/dL (ref 30.0–36.0)
MCV: 92.8 fL (ref 80.0–100.0)
Platelets: 209 10*3/uL (ref 150–400)
RBC: 4.14 MIL/uL (ref 3.87–5.11)
RDW: 13.2 % (ref 11.5–15.5)
WBC: 5.9 10*3/uL (ref 4.0–10.5)
nRBC: 0 % (ref 0.0–0.2)

## 2023-10-18 LAB — TROPONIN I (HIGH SENSITIVITY)
Troponin I (High Sensitivity): 7 ng/L (ref <18)
Troponin I (High Sensitivity): 9 ng/L (ref <18)

## 2023-10-18 LAB — LIPASE, BLOOD: Lipase: 32 U/L (ref 11–51)

## 2023-10-18 MED ORDER — PANTOPRAZOLE SODIUM 40 MG IV SOLR
40.0000 mg | Freq: Once | INTRAVENOUS | Status: AC
Start: 2023-10-18 — End: 2023-10-18
  Administered 2023-10-18: 40 mg via INTRAVENOUS
  Filled 2023-10-18: qty 10

## 2023-10-18 MED ORDER — ALUM & MAG HYDROXIDE-SIMETH 200-200-20 MG/5ML PO SUSP
30.0000 mL | Freq: Once | ORAL | Status: AC
  Administered 2023-10-18: 30 mL via ORAL
  Filled 2023-10-18: qty 30

## 2023-10-18 MED ORDER — HYDROCODONE-ACETAMINOPHEN 5-325 MG PO TABS
1.0000 | ORAL_TABLET | Freq: Once | ORAL | Status: AC
  Administered 2023-10-18: 1 via ORAL
  Filled 2023-10-18: qty 1

## 2023-10-18 MED ORDER — ONDANSETRON 4 MG PO TBDP: 4.0000 mg | ORAL_TABLET | Freq: Three times a day (TID) | ORAL | 0 refills | Status: AC | PRN

## 2023-10-18 MED ORDER — FENTANYL CITRATE PF 50 MCG/ML IJ SOSY
50.0000 ug | PREFILLED_SYRINGE | Freq: Once | INTRAMUSCULAR | Status: AC
  Administered 2023-10-18: 50 ug via INTRAVENOUS
  Filled 2023-10-18: qty 1

## 2023-10-18 MED ORDER — SODIUM CHLORIDE 0.9 % IV SOLN
1.0000 g | Freq: Once | INTRAVENOUS | Status: AC
  Administered 2023-10-18: 1 g via INTRAVENOUS
  Filled 2023-10-18: qty 10

## 2023-10-18 MED ORDER — ONDANSETRON HCL 4 MG/2ML IJ SOLN
4.0000 mg | Freq: Once | INTRAMUSCULAR | Status: AC | PRN
  Administered 2023-10-18: 4 mg via INTRAVENOUS
  Filled 2023-10-18: qty 2

## 2023-10-18 MED ORDER — CEFPODOXIME PROXETIL 200 MG PO TABS: 200.0000 mg | ORAL_TABLET | Freq: Two times a day (BID) | ORAL | 0 refills | Status: AC

## 2023-10-18 NOTE — ED Triage Notes (Signed)
Pt reports that this week she began to have LT sided flank pain and she thought it was gas. She now feels like the pain radiates from LT flank to epigastric area and she thinks it is gas. Hx of UTI's. No hx kidney stones. Feeling a little nauseous, no fevers. No diarrhea.

## 2023-10-18 NOTE — ED Provider Notes (Signed)
Signout from Dr. Pecola Leisure.  82 year old female with flank abdominal pain.  Being treated for likely UTI.  Getting IV antibiotics.  Plan is to discharge after IV antibiotics finished infusing. Physical Exam  BP (!) 140/78   Pulse 68   Temp 98.1 F (36.7 C) (Tympanic)   Resp 20   Ht 5\' 4"  (1.626 m)   Wt 90.7 kg   SpO2 98%   BMI 34.33 kg/m   Physical Exam  Procedures  Procedures  ED Course / MDM    Medical Decision Making Amount and/or Complexity of Data Reviewed Labs: ordered. Radiology: ordered.  Risk OTC drugs. Prescription drug management.   Antibiotics are finished.  Will proceed with plan for discharge.       Terrilee Files, MD 10/19/23 1630

## 2023-10-18 NOTE — ED Notes (Signed)
Patient transported to CT 

## 2023-10-18 NOTE — ED Provider Notes (Signed)
[facility] Provider Note   CSN: [id number] Arrival date & time: 10/18/23  0321     History  Chief Complaint  Patient presents with   Abdominal Pain   Back Pain    Wendy Carrillo is a 82 y.o. female.  The history is provided by the patient.  Abdominal Pain Back Pain Associated symptoms: abdominal pain   Wendy Carrillo is a 82 y.o. female who presents to the Emergency Department complaining of abdominal pain.  She presents to the emergency department for evaluation of left upper quadrant abdominal pain that started in the evening and became severe around 11 PM.  Pain radiates to her upper and lower back.  She also has some mild central abdominal pain.  No fever, difficulty breathing, vomiting.  She does have nausea as well as urinary frequency, dysuria.  She has a history of chronic back pain but this feels different.  She did take a cast medication thinking that was a contributing factor.   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   Home Medications Prior to Admission medications   Medication Sig Start Date End Date Taking? Authorizing Provider  cefpodoxime (VANTIN) 200 MG tablet Take 1 tablet (200 mg total) by mouth 2 (two) times daily. 10/18/23  Yes Tilden Fossa, MD  ondansetron (ZOFRAN-ODT) 4 MG disintegrating tablet Take 1 tablet (4 mg total) by mouth every 8 (eight) hours as needed for nausea or vomiting. 10/18/23  Yes Tilden Fossa, MD  ALPRAZolam Prudy Feeler) 0.25 MG tablet Take 1 tablet (0.25 mg total) by mouth daily as needed for anxiety. 10/15/22   Tyrone Nine, MD  apixaban (ELIQUIS) 5 MG TABS tablet Take 1 tablet (5 mg total) by mouth 2 (two) times daily. 10/02/22   Peter Garter, PA  cyanocobalamin (VITAMIN B12) 1000 MCG tablet Take 1 tablet (1,000 mcg total) by mouth daily. 10/15/22   Tyrone Nine, MD  esomeprazole (NEXIUM) 40 MG  capsule Take 40 mg by mouth daily. 09/13/22   [provider]  fluticasone (FLONASE) 50 MCG/ACT nasal spray Place 1 spray into both nostrils daily as needed for allergies or rhinitis. 10/15/22   Tyrone Nine, MD  furosemide (LASIX) 20 MG tablet Take 20 mg by mouth daily as needed for fluid.    [provider]  gabapentin (NEURONTIN) 300 MG capsule TAKE 2 CAPSULES BY MOUTH THREE TIMES PER DAY. CAN SLOWLY INCREASE FROM CURRENT PRESCRIPTION. Patient taking differently: Take 600 mg by mouth daily as needed (for pain). Can slowly increase from current prescription. 10/05/22   Juanda Chance, NP  hydrOXYzine (ATARAX) 10 MG tablet One tab every 12 hours as needed for itching Patient taking differently: Take 10 mg by mouth daily as needed for itching. 03/25/22   Dorothyann Peng, MD  loratadine (CLARITIN) 10 MG tablet Take 10 mg by mouth daily as needed for allergies. In the morning    [provider]  methocarbamol (ROBAXIN) 500 MG tablet Take 1 tablet (500 mg total) by mouth every 8 (eight) hours as needed for muscle spasms. 12/31/22   Magnant, Joycie Peek, PA-C  Multiple Vitamins-Minerals (AIRBORNE GUMMIES PO) Take 2 tablets by mouth daily.    [provider]  predniSONE (DELTASONE) 50 MG tablet Pt to take 50 mg of prednisone on 09/09/23 at 8:00PM, 50 mg of prednisone on 09/10/23 at 2:00AM, and 50 mg of prednisone  on 09/10/23 at 8:00AM. Pt is also to take 50 mg of benadryl on 09/10/23 at 8:00AM. Please call [phone] with any questions. 09/09/23   El-Abd, Aaron Edelman, MD  traMADol (ULTRAM) 50 MG tablet Take 1 tablet (50 mg total) by mouth every 12 (twelve) hours as needed for moderate pain or severe pain. 10/15/22   Tyrone Nine, MD  Vitamin D, Ergocalciferol, (DRISDOL) 50000 UNITS CAPS capsule Take 50,000 Units by mouth every Thursday.    [provider]      Allergies    Contrast media [iodinated contrast media], Penicillins, Cyclobenzaprine, Lactose intolerance  (gi), Tilactase, Baclofen, Latex, Other, and Tape    Review of Systems   Review of Systems  Gastrointestinal:  Positive for abdominal pain.  Musculoskeletal:  Positive for back pain.  All other systems reviewed and are negative.   Physical Exam Updated Vital Signs BP (!) 140/78   Pulse 68   Temp 98.1 F (36.7 C) (Tympanic)   Resp 20   Ht 5\' 4"  (1.626 m)   Wt 90.7 kg   SpO2 98%   BMI 34.33 kg/m  Physical Exam Vitals and nursing note reviewed.  Constitutional:      Appearance: She is well-developed.  HENT:     Head: Normocephalic and atraumatic.  Cardiovascular:     Rate and Rhythm: Normal rate and regular rhythm.  Pulmonary:     Effort: Pulmonary effort is normal. No respiratory distress.  Abdominal:     Palpations: Abdomen is soft.     Tenderness: There is no guarding or rebound.     Comments: Mild epigastric, luq tenderness  Musculoskeletal:        General: No tenderness.     Comments: Nonpitting edema to BLE.  2+ DP pulses bilaterally  Skin:    General: Skin is warm and dry.  Neurological:     Mental Status: She is alert and oriented to person, place, and time.     Comments: 5/5 strength in BLE  Psychiatric:        Behavior: Behavior normal.     ED Results / Procedures / Treatments   Labs (all labs ordered are listed, but only abnormal results are displayed) Labs Reviewed  COMPREHENSIVE METABOLIC PANEL - Abnormal; Notable for the following components:      Result Value   BUN 7 (*)    Total Protein 6.3 (*)    All other components within normal limits  URINALYSIS, W/ REFLEX TO CULTURE (INFECTION SUSPECTED) - Abnormal; Notable for the following components:   Leukocytes,Ua SMALL (*)    Bacteria, UA RARE (*)    All other components within normal limits  LIPASE, BLOOD  CBC  TROPONIN I (HIGH SENSITIVITY)  TROPONIN I (HIGH SENSITIVITY)    EKG EKG Interpretation Date/Time:  Friday October 18 2023 03:50:10 EST Ventricular Rate:  63 PR  Interval:  169 QRS Duration:  99 QT Interval:  416 QTC Calculation: 426 R Axis:   -35  Text Interpretation: Sinus rhythm Left ventricular hypertrophy Anterior Q waves, possibly due to LVH Nonspecific T abnormalities, inferior leads Confirmed by Tilden Fossa [phone]) on 10/18/2023 4:04:02 AM  Radiology DG Chest 2 View Result Date: 10/18/2023 CLINICAL DATA:  Chest and epigastric pain. EXAM: CHEST - 2 VIEW COMPARISON:  Portable chest 08/08/2023 FINDINGS: The lungs are clear with mild chronic asymmetric elevation of the right hemidiaphragm. The heart is slightly enlarged. No vascular congestion is seen. Aortic atherosclerosis. There is mild aortic tortuosity with stable mediastinal configuration. Osteopenia  and degenerative change of the spine and shoulders. Cholecystectomy clips. IMPRESSION: No evidence of acute chest disease. Slight cardiomegaly. Aortic atherosclerosis. Electronically Signed   By: Almira Bar M.D.   On: 10/18/2023 06:42   CT ABDOMEN PELVIS WO CONTRAST Result Date: 10/18/2023 CLINICAL DATA:  Left flank pain. EXAM: CT ABDOMEN AND PELVIS WITHOUT CONTRAST TECHNIQUE: Multidetector CT imaging of the abdomen and pelvis was performed following the standard protocol without IV contrast. RADIATION DOSE REDUCTION: This exam was performed according to the departmental dose-optimization program which includes automated exposure control, adjustment of the mA and/or kV according to patient size and/or use of iterative reconstruction technique. COMPARISON:  Multiple CTs dating back to 2001. The 2 most recent are both without contrast and dated 01/18/2023 and 11/11/2021. FINDINGS: Lower chest: The heart is slightly enlarged. There is calcification in the right coronary artery, prior studies showing heavy three-vessel CAD. Small hiatal hernia. There is posterior atelectasis in the lung bases without infiltrates. Hepatobiliary: The upper liver dome was excluded from the study. The visualized liver is  unremarkable without contrast. Status post cholecystectomy again is seen without biliary dilatation. Pancreas: Unremarkable without contrast. Spleen: Unremarkable without contrast. Adrenals, urinary tract: There is no adrenal mass. There is interval increased haziness around the left kidney and renal pelvis. No intrarenal stones are seen. This could be due to a recently passed stone or due to pyelonephritis. 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 No follow-up imaging is recommended. There is no other contour deforming abnormality of either kidney. The bladder is unremarkable for the degree of distention. Stomach/bowel: No dilatation or wall thickening. An appendix is not seen. Uncomplicated sigmoid diverticulosis. Vascular/Lymphatic: Aortic atherosclerosis. No enlarged abdominal or pelvic lymph nodes. There are numerous pelvic phleboliths. Reproductive: Status post hysterectomy. No adnexal masses. Other: Postsurgical change upper abdominal wall. There is no incarcerated hernia. There is no free fluid, free hemorrhage or free air. Musculoskeletal: Dextrorotary scoliosis, osteopenia, and advanced degenerative change lumbar spine. Advanced degenerative change lower thoracic spine also. Acquired spinal stenosis L2-3, L3-4 and L4-5. Mild-to-moderate hip DJD. IMPRESSION: 1. Interval increased haziness around the left kidney and renal pelvis. No urinary stones are seen. This could be due to a recently passed stone or due to pyelonephritis. 2. 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 3. Aortic and coronary artery atherosclerosis. 4. Small hiatal hernia. 5. Uncomplicated diverticulosis. 6. Osteopenia, scoliosis and degenerative change. 7. Acquired spinal stenosis L2-3, L3-4 and L4-5. Aortic Atherosclerosis (ICD10-I70.0). Electronically Signed   By: Almira Bar M.D.   On: 10/18/2023 06:40     Procedures Procedures    Medications Ordered in ED Medications  cefTRIAXone (ROCEPHIN) 1 g in sodium chloride 0.9 % 100 mL IVPB (1 g Intravenous New Bag/Given 10/18/23 0655)  ondansetron (ZOFRAN) injection 4 mg (4 mg Intravenous Given 10/18/23 0358)  fentaNYL (SUBLIMAZE) injection 50 mcg (50 mcg Intravenous Given 10/18/23 0409)  pantoprazole (PROTONIX) injection 40 mg (40 mg Intravenous Given 10/18/23 0530)  alum & mag hydroxide-simeth (MAALOX/MYLANTA) 200-200-20 MG/5ML suspension 30 mL (30 mLs Oral Given 10/18/23 0530)    ED Course/ Medical Decision Making/ A&P                                 Medical Decision Making Amount and/or Complexity of Data Reviewed Labs: ordered.  Radiology: ordered.  Risk OTC drugs. Prescription drug management.   Patient with history of hypertension, remote history of DVT not on anticoagulation, MGUS here for evaluation of left upper quadrant abdominal pain that radiates throughout her entire back.  She does have tenderness on examination without peritoneal findings.  There are no focal neurologic deficits.  She has partial improvement in symptoms after initial medications in the emergency department.  BMP, CBC without significant abnormality.  UA is equivocal for UTI.  CT scan is concerning for possible pyelonephritis.  Will start antibiotics given her symptoms.  Pain is improved on recheck in the emergency department.  Discussed with patient home care for likely UTI.  Discussed outpatient follow-up and return precautions.  Presentation is not c/w PE, dissection.         Final Clinical Impression(s) / ED Diagnoses Final diagnoses:  Acute pyelonephritis    Rx / DC Orders ED Discharge Orders          Ordered    cefpodoxime (VANTIN) 200 MG tablet  2 times daily        10/18/23 0701    ondansetron (ZOFRAN-ODT) 4 MG disintegrating tablet  Every 8 hours PRN        10/18/23 0701              Tilden Fossa, MD 10/18/23 [phone]

## 2023-10-18 NOTE — ED Notes (Signed)
Pt calling out for more pain meds, will alert provider

## 2023-10-20 ENCOUNTER — Other Ambulatory Visit: Payer: Self-pay

## 2023-10-20 ENCOUNTER — Encounter (HOSPITAL_BASED_OUTPATIENT_CLINIC_OR_DEPARTMENT_OTHER): Payer: Self-pay | Admitting: Emergency Medicine

## 2023-10-20 ENCOUNTER — Observation Stay (HOSPITAL_BASED_OUTPATIENT_CLINIC_OR_DEPARTMENT_OTHER)
Admission: EM | Admit: 2023-10-20 | Discharge: 2023-10-22 | Disposition: A | Discharge status: Home / Self Care | Payer: Medicare PPO | Attending: Family Medicine | Admitting: Family Medicine

## 2023-10-20 DIAGNOSIS — Z79899 Other long term (current) drug therapy: Secondary | ICD-10-CM | POA: Diagnosis not present

## 2023-10-20 DIAGNOSIS — R0602 Shortness of breath: Secondary | ICD-10-CM | POA: Insufficient documentation

## 2023-10-20 DIAGNOSIS — N12 Tubulo-interstitial nephritis, not specified as acute or chronic: Principal | ICD-10-CM | POA: Diagnosis present

## 2023-10-20 DIAGNOSIS — N10 Acute tubulo-interstitial nephritis: Principal | ICD-10-CM | POA: Insufficient documentation

## 2023-10-20 DIAGNOSIS — R109 Unspecified abdominal pain: Secondary | ICD-10-CM | POA: Diagnosis not present

## 2023-10-20 DIAGNOSIS — M25569 Pain in unspecified knee: Secondary | ICD-10-CM | POA: Insufficient documentation

## 2023-10-20 DIAGNOSIS — Z6841 Body Mass Index (BMI) 40.0 and over, adult: Secondary | ICD-10-CM | POA: Insufficient documentation

## 2023-10-20 DIAGNOSIS — Z91040 Latex allergy status: Secondary | ICD-10-CM | POA: Diagnosis not present

## 2023-10-20 DIAGNOSIS — I10 Essential (primary) hypertension: Secondary | ICD-10-CM | POA: Diagnosis not present

## 2023-10-20 DIAGNOSIS — Z86718 Personal history of other venous thrombosis and embolism: Secondary | ICD-10-CM | POA: Insufficient documentation

## 2023-10-20 DIAGNOSIS — M5442 Lumbago with sciatica, left side: Secondary | ICD-10-CM | POA: Diagnosis present

## 2023-10-20 DIAGNOSIS — M069 Rheumatoid arthritis, unspecified: Secondary | ICD-10-CM | POA: Insufficient documentation

## 2023-10-20 DIAGNOSIS — Z96653 Presence of artificial knee joint, bilateral: Secondary | ICD-10-CM | POA: Diagnosis not present

## 2023-10-20 DIAGNOSIS — G8929 Other chronic pain: Secondary | ICD-10-CM | POA: Diagnosis not present

## 2023-10-20 DIAGNOSIS — Z7901 Long term (current) use of anticoagulants: Secondary | ICD-10-CM | POA: Insufficient documentation

## 2023-10-20 DIAGNOSIS — M4800 Spinal stenosis, site unspecified: Secondary | ICD-10-CM | POA: Diagnosis not present

## 2023-10-20 NOTE — ED Triage Notes (Signed)
Pt comes in POV w/ c/o back pain. Pt reports she was diagnosed w/ kidney infection on Friday & given antibiotics. Reports she is still having pain.

## 2023-10-21 ENCOUNTER — Observation Stay (HOSPITAL_COMMUNITY): Payer: Medicare PPO

## 2023-10-21 DIAGNOSIS — N10 Acute tubulo-interstitial nephritis: Secondary | ICD-10-CM | POA: Diagnosis not present

## 2023-10-21 DIAGNOSIS — Z91040 Latex allergy status: Secondary | ICD-10-CM | POA: Diagnosis not present

## 2023-10-21 DIAGNOSIS — N12 Tubulo-interstitial nephritis, not specified as acute or chronic: Principal | ICD-10-CM | POA: Diagnosis present

## 2023-10-21 DIAGNOSIS — I10 Essential (primary) hypertension: Secondary | ICD-10-CM | POA: Diagnosis not present

## 2023-10-21 DIAGNOSIS — R0602 Shortness of breath: Secondary | ICD-10-CM | POA: Diagnosis not present

## 2023-10-21 DIAGNOSIS — Z7901 Long term (current) use of anticoagulants: Secondary | ICD-10-CM | POA: Diagnosis not present

## 2023-10-21 DIAGNOSIS — M4800 Spinal stenosis, site unspecified: Secondary | ICD-10-CM | POA: Diagnosis not present

## 2023-10-21 DIAGNOSIS — Z6841 Body Mass Index (BMI) 40.0 and over, adult: Secondary | ICD-10-CM | POA: Diagnosis not present

## 2023-10-21 DIAGNOSIS — R109 Unspecified abdominal pain: Secondary | ICD-10-CM | POA: Diagnosis not present

## 2023-10-21 DIAGNOSIS — Z79899 Other long term (current) drug therapy: Secondary | ICD-10-CM | POA: Diagnosis not present

## 2023-10-21 DIAGNOSIS — Z86718 Personal history of other venous thrombosis and embolism: Secondary | ICD-10-CM | POA: Diagnosis not present

## 2023-10-21 DIAGNOSIS — Z96653 Presence of artificial knee joint, bilateral: Secondary | ICD-10-CM | POA: Diagnosis not present

## 2023-10-21 DIAGNOSIS — M069 Rheumatoid arthritis, unspecified: Secondary | ICD-10-CM | POA: Diagnosis not present

## 2023-10-21 DIAGNOSIS — M5442 Lumbago with sciatica, left side: Secondary | ICD-10-CM | POA: Diagnosis present

## 2023-10-21 DIAGNOSIS — M25569 Pain in unspecified knee: Secondary | ICD-10-CM | POA: Diagnosis not present

## 2023-10-21 DIAGNOSIS — G8929 Other chronic pain: Secondary | ICD-10-CM | POA: Diagnosis not present

## 2023-10-21 LAB — CBC WITH DIFFERENTIAL/PLATELET
Abs Immature Granulocytes: 0.01 10*3/uL (ref 0.00–0.07)
Abs Immature Granulocytes: 0.01 10*3/uL (ref 0.00–0.07)
Basophils Absolute: 0 10*3/uL (ref 0.0–0.1)
Basophils Absolute: 0 10*3/uL (ref 0.0–0.1)
Basophils Relative: 1 %
Basophils Relative: 0 %
Eosinophils Absolute: 0.2 10*3/uL (ref 0.0–0.5)
Eosinophils Absolute: 0.2 10*3/uL (ref 0.0–0.5)
Eosinophils Relative: 2 %
Eosinophils Relative: 3 %
HCT: 38.7 % (ref 36.0–46.0)
HCT: 38.2 % (ref 36.0–46.0)
Hemoglobin: 12.2 g/dL (ref 12.0–15.0)
Hemoglobin: 11.8 g/dL — ABNORMAL LOW (ref 12.0–15.0)
Immature Granulocytes: 0 %
Immature Granulocytes: 0 %
Lymphocytes Relative: 38 %
Lymphocytes Relative: 39 %
Lymphs Abs: 2.5 10*3/uL (ref 0.7–4.0)
Lymphs Abs: 2.1 10*3/uL (ref 0.7–4.0)
MCH: 29.4 pg (ref 26.0–34.0)
MCH: 29.9 pg (ref 26.0–34.0)
MCHC: 31.5 g/dL (ref 30.0–36.0)
MCHC: 30.9 g/dL (ref 30.0–36.0)
MCV: 93.3 fL (ref 80.0–100.0)
MCV: 96.7 fL (ref 80.0–100.0)
Monocytes Absolute: 0.7 10*3/uL (ref 0.1–1.0)
Monocytes Absolute: 0.6 10*3/uL (ref 0.1–1.0)
Monocytes Relative: 10 %
Monocytes Relative: 10 %
Neutro Abs: 3.1 10*3/uL (ref 1.7–7.7)
Neutro Abs: 2.6 10*3/uL (ref 1.7–7.7)
Neutrophils Relative %: 49 %
Neutrophils Relative %: 48 %
Platelets: 204 10*3/uL (ref 150–400)
Platelets: 182 10*3/uL (ref 150–400)
RBC: 4.15 MIL/uL (ref 3.87–5.11)
RBC: 3.95 MIL/uL (ref 3.87–5.11)
RDW: 13.2 % (ref 11.5–15.5)
RDW: 13.3 % (ref 11.5–15.5)
WBC: 6.5 10*3/uL (ref 4.0–10.5)
WBC: 5.5 10*3/uL (ref 4.0–10.5)
nRBC: 0 % (ref 0.0–0.2)
nRBC: 0 % (ref 0.0–0.2)

## 2023-10-21 LAB — BASIC METABOLIC PANEL
Anion gap: 6 (ref 5–15)
Anion gap: 6 (ref 5–15)
BUN: 13 mg/dL (ref 8–23)
BUN: 12 mg/dL (ref 8–23)
CO2: 29 mmol/L (ref 22–32)
CO2: 28 mmol/L (ref 22–32)
Calcium: 8.8 mg/dL — ABNORMAL LOW (ref 8.9–10.3)
Calcium: 8.6 mg/dL — ABNORMAL LOW (ref 8.9–10.3)
Chloride: 105 mmol/L (ref 98–111)
Chloride: 103 mmol/L (ref 98–111)
Creatinine, Ser: 0.68 mg/dL (ref 0.44–1.00)
Creatinine, Ser: 0.74 mg/dL (ref 0.44–1.00)
GFR, Estimated: >60 mL/min (ref >60)
GFR, Estimated: >60 mL/min (ref >60)
Glucose, Bld: 96 mg/dL (ref 70–99)
Glucose, Bld: 104 mg/dL — ABNORMAL HIGH (ref 70–99)
Potassium: 3.8 mmol/L (ref 3.5–5.1)
Potassium: 3.4 mmol/L — ABNORMAL LOW (ref 3.5–5.1)
Sodium: 140 mmol/L (ref 135–145)
Sodium: 137 mmol/L (ref 135–145)

## 2023-10-21 LAB — LACTIC ACID, PLASMA: Lactic Acid, Venous: 0.7 mmol/L (ref 0.5–1.9)

## 2023-10-21 LAB — URINALYSIS, ROUTINE W REFLEX MICROSCOPIC
Bilirubin Urine: NEGATIVE
Glucose, UA: NEGATIVE mg/dL
Hgb urine dipstick: NEGATIVE
Ketones, ur: NEGATIVE mg/dL
Nitrite: NEGATIVE
Protein, ur: NEGATIVE mg/dL
Specific Gravity, Urine: 1.01 (ref 1.005–1.030)
pH: 6 (ref 5.0–8.0)

## 2023-10-21 LAB — URINALYSIS, MICROSCOPIC (REFLEX)

## 2023-10-21 LAB — BRAIN NATRIURETIC PEPTIDE: B Natriuretic Peptide: 80.2 pg/mL (ref 0.0–100.0)

## 2023-10-21 LAB — D-DIMER, QUANTITATIVE: D-Dimer, Quant: 0.39 ug{FEU}/mL (ref 0.00–0.50)

## 2023-10-21 MED ORDER — POTASSIUM CHLORIDE CRYS ER 20 MEQ PO TBCR
40.0000 meq | EXTENDED_RELEASE_TABLET | Freq: Once | ORAL | Status: AC
  Administered 2023-10-21: 40 meq via ORAL
  Filled 2023-10-21: qty 2

## 2023-10-21 MED ORDER — MORPHINE SULFATE (PF) 4 MG/ML IV SOLN
4.0000 mg | Freq: Once | INTRAVENOUS | Status: AC
Start: 2023-10-21 — End: 2023-10-21
  Administered 2023-10-21: 4 mg via INTRAVENOUS
  Filled 2023-10-21: qty 1

## 2023-10-21 MED ORDER — ONDANSETRON HCL 4 MG/2ML IJ SOLN
4.0000 mg | Freq: Once | INTRAMUSCULAR | Status: AC
Start: 2023-10-21 — End: 2023-10-21
  Administered 2023-10-21: 4 mg via INTRAVENOUS
  Filled 2023-10-21: qty 2

## 2023-10-21 MED ORDER — GADOBUTROL 1 MMOL/ML IV SOLN
10.0000 mL | Freq: Once | INTRAVENOUS | Status: AC | PRN
Start: 2023-10-21 — End: 2023-10-21
  Administered 2023-10-21: 10 mL via INTRAVENOUS

## 2023-10-21 MED ORDER — MELATONIN 5 MG PO TABS
5.0000 mg | ORAL_TABLET | Freq: Every evening | ORAL | Status: DC | PRN
  Administered 2023-10-21: 5 mg via ORAL
  Filled 2023-10-21: qty 1

## 2023-10-21 MED ORDER — GABAPENTIN 300 MG PO CAPS
300.0000 mg | ORAL_CAPSULE | Freq: Two times a day (BID) | ORAL | Status: DC
Start: 2023-10-21 — End: 2023-10-22
  Administered 2023-10-21 – 2023-10-22 (×3): 300 mg via ORAL
  Filled 2023-10-21 (×3): qty 1

## 2023-10-21 MED ORDER — OXYCODONE HCL 5 MG PO TABS: 5.0000 mg | ORAL_TABLET | ORAL | Status: DC | PRN

## 2023-10-21 MED ORDER — TRAMADOL HCL 50 MG PO TABS
50.0000 mg | ORAL_TABLET | Freq: Two times a day (BID) | ORAL | Status: DC
  Administered 2023-10-21 – 2023-10-22 (×3): 50 mg via ORAL
  Filled 2023-10-21 (×3): qty 1

## 2023-10-21 MED ORDER — MORPHINE SULFATE (PF) 4 MG/ML IV SOLN
4.0000 mg | Freq: Once | INTRAVENOUS | Status: AC
  Administered 2023-10-21: 4 mg via INTRAVENOUS
  Filled 2023-10-21: qty 1

## 2023-10-21 MED ORDER — DIPHENHYDRAMINE HCL 25 MG PO CAPS
25.0000 mg | ORAL_CAPSULE | Freq: Once | ORAL | Status: AC | PRN
  Administered 2023-10-21: 25 mg via ORAL
  Filled 2023-10-21: qty 1

## 2023-10-21 MED ORDER — ENOXAPARIN SODIUM 40 MG/0.4ML IJ SOSY
40.0000 mg | PREFILLED_SYRINGE | INTRAMUSCULAR | Status: DC
  Administered 2023-10-21 – 2023-10-22 (×2): 40 mg via SUBCUTANEOUS
  Filled 2023-10-21 (×2): qty 0.4

## 2023-10-21 MED ORDER — SODIUM CHLORIDE 0.9 % IV SOLN
2.0000 g | INTRAVENOUS | Status: DC
  Administered 2023-10-22: 2 g via INTRAVENOUS
  Filled 2023-10-21: qty 20

## 2023-10-21 MED ORDER — PANTOPRAZOLE SODIUM 40 MG PO TBEC
40.0000 mg | DELAYED_RELEASE_TABLET | Freq: Every day | ORAL | Status: DC
  Administered 2023-10-21 – 2023-10-22 (×2): 40 mg via ORAL
  Filled 2023-10-21 (×2): qty 1

## 2023-10-21 MED ORDER — ORAL CARE MOUTH RINSE
15.0000 mL | OROMUCOSAL | Status: DC | PRN
Start: 2023-10-21 — End: 2023-10-22

## 2023-10-21 MED ORDER — ACETAMINOPHEN 325 MG PO TABS
650.0000 mg | ORAL_TABLET | Freq: Four times a day (QID) | ORAL | Status: DC | PRN
  Administered 2023-10-21: 650 mg via ORAL
  Filled 2023-10-21: qty 2

## 2023-10-21 MED ORDER — SODIUM CHLORIDE 0.9 % IV SOLN
2.0000 g | Freq: Once | INTRAVENOUS | Status: AC
  Administered 2023-10-21: 2 g via INTRAVENOUS
  Filled 2023-10-21: qty 20

## 2023-10-21 MED ORDER — ACETAMINOPHEN 650 MG RE SUPP: 650.0000 mg | Freq: Four times a day (QID) | RECTAL | Status: DC | PRN

## 2023-10-21 MED ORDER — HYDROMORPHONE HCL 1 MG/ML IJ SOLN
0.5000 mg | INTRAMUSCULAR | Status: DC | PRN
  Administered 2023-10-21 (×2): 0.5 mg via INTRAVENOUS
  Filled 2023-10-21 (×2): qty 0.5

## 2023-10-21 NOTE — ED Notes (Signed)
[facility] called for transport. 

## 2023-10-21 NOTE — Evaluation (Signed)
Physical Therapy Evaluation Patient Details Name: HALENE VANVICKLE MRN: [id number] DOB: 07/26/1941 Today's Date: 10/21/2023  History of Present Illness  82 yo female  comes to ED 10/18/23 with fevers, recent treatment for pyelonephritis,  nausea, poor apetite, knee  pain, sciatica. PMH: HTN, anxiety, chronic back pain, and hx of DVT. MR  of  lumbar shows no acute findings and no significant   changes from previous results.  Clinical Impression  Pt admitted with above diagnosis.  Pt currently with functional limitations due to the deficits listed below (see PT Problem List). Pt will benefit from acute skilled PT to increase their independence and safety with mobility to allow discharge.     The patient  reports that she has been ambulating to BR with no device and supervision today. Patient  reporting right knee pain upon standing  with PT, Patient noted with RLE antalgic  gait and  unsteadiness so provided a RW  To progress ambulation  into the hall. Patient reports that she has been receiving OPPT  for right hand and left elbow as well as multiple lumbar ablations.  Patient should progress to return home with family support.       If plan is discharge home, recommend the following: A little help with walking and/or transfers;Assistance with cooking/housework;Help with stairs or ramp for entrance;Assist for transportation   Can travel by private vehicle        Equipment Recommendations None recommended by PT  Recommendations for Other Services       Functional Status Assessment Patient has had a recent decline in their functional status and demonstrates the ability to make significant improvements in function in a reasonable and predictable amount of time.     Precautions / Restrictions Precautions Precautions: Fall Restrictions Weight Bearing Restrictions Per Provider Order: No      Mobility  Bed Mobility Overal bed mobility: Modified Independent                   Transfers Overall transfer level: Needs assistance Equipment used: None Transfers: Sit to/from Stand             General transfer comment: patient reporting right knee pain that just started, noted antalgic to step    Ambulation/Gait Ambulation/Gait assistance: Min assist Gait Distance (Feet): 50 Feet Assistive device: Rolling walker (2 wheels) Gait Pattern/deviations: Step-to pattern, Step-through pattern, Antalgic Gait velocity: decr     General Gait Details: antalgic on right leg  Stairs            Wheelchair Mobility     Tilt Bed    Modified Rankin (Stroke Patients Only)       Balance Overall balance assessment: Needs assistance Sitting-balance support: Bilateral upper extremity supported Sitting balance-Leahy Scale: Good     Standing balance support: During functional activity, No upper extremity supported Standing balance-Leahy Scale: Poor Standing balance comment: due  to reports right knee pain, provided RW for support                             Pertinent Vitals/Pain Pain Assessment Pain Assessment: Faces Faces Pain Scale: Hurts even more Pain Location: right knee and left lower leg Pain Descriptors / Indicators: Discomfort Pain Intervention(s): Monitored during session, Repositioned    Home Living Family/patient expects to be discharged to:: Private residence Living Arrangements: Spouse/significant other Available Help at Discharge: Family;Available 24 hours/day Type of Home: House Home Access: Stairs to enter Entrance  Stairs-Rails: None Entrance Stairs-Number of Steps: 3   Home Layout: One level Home Equipment: Agricultural consultant (2 wheels);Cane - single point;BSC/3in1      Prior Function Prior Level of Function : Independent/Modified Independent             Mobility Comments: using cane       Extremity/Trunk Assessment   Upper Extremity Assessment Upper Extremity Assessment: LUE deficits/detail;RUE  deficits/detail;Right hand dominant RUE Deficits / Details: having PT for  hand, 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,    Lower Extremity Assessment Lower Extremity Assessment: RLE deficits/detail;LLE deficits/detail RLE Deficits / Details: reports  knee pain LLE Deficits / Details: reports  knee buckles at tiles, ,pain down lower leg    Cervical / Trunk Assessment Cervical / Trunk Assessment: Normal  Communication   Communication Communication: No apparent difficulties  Cognition Arousal: Alert Behavior During Therapy: WFL for tasks assessed/performed Overall Cognitive Status: Within Functional Limits for tasks assessed                                          General Comments      Exercises     Assessment/Plan    PT Assessment Patient needs continued PT services  PT Problem List Decreased activity tolerance;Pain;Decreased balance;Decreased strength;Decreased mobility       PT Treatment Interventions DME instruction;Therapeutic activities;Gait training;Therapeutic exercise;Functional mobility training    PT Goals (Current goals can be found in the Care Plan section)  Acute Rehab PT Goals Patient Stated Goal: go home, feel better PT Goal Formulation: With patient Time For Goal Achievement: 11/04/23 Potential to Achieve Goals: Good    Frequency Min 1X/week     Co-evaluation               AM-PAC PT "6 Clicks" Mobility  Outcome Measure Help needed turning from your back to your side while in a flat bed without using bedrails?: None Help needed moving from lying on your back to sitting on the side of a flat bed without using bedrails?: None Help needed moving to and from a bed to a chair (including a wheelchair)?: A Little Help needed standing up from a chair using your arms (e.g., wheelchair or bedside chair)?: A Little Help needed to walk in hospital room?: A Little Help needed climbing 3-5 steps with a railing? : A Lot 6 Click Score: 19    End of Session  Equipment Utilized During Treatment: Gait belt Activity Tolerance: Patient limited by fatigue Patient left: in chair;with call bell/phone within reach Nurse Communication: Mobility status PT Visit Diagnosis: Unsteadiness on feet (R26.81);Difficulty in walking, not elsewhere classified (R26.2);Pain Pain - Right/Left: Right Pain - part of body: Knee    Time: 4010-2725 PT Time Calculation (min) (ACUTE ONLY): 20 min   Charges:   PT Evaluation $PT Eval Low Complexity: 1 Low   PT General Charges $$ ACUTE PT VISIT: 1 Visit         Blanchard Kelch PT Acute Rehabilitation Services Office [phone] Weekend [phone]   Rada Hay 10/21/2023, 4:31 PM

## 2023-10-21 NOTE — Progress Notes (Signed)
Plan of Care Note for accepted transfer   Patient: Wendy Carrillo MRN: [id number]   DOA: 10/20/2023  Facility requesting transfer: [facility]   Requesting Provider: Dr. Adela Lank   Reason for transfer: Suspected pyelonephritis   Facility course: 82 yr old female with HTN, anxiety, chronic back pain, and hx of DVT who p/w several days of left flank pain and worsening nausea, chills, and loss of appetite. She was seen for this on 12/13, was diagnosed with pyelonephritis, and went home with Vantin but returns with ongoing symptoms.   She is afebrile with normal BMP, CBC, and lactate. There is only rare bacteria and 0-5 wbc/hpf on urine microscopy, and trace leukocytes on dipstick, but presentation seems consistent with pyelonephritis per ED physician.   Urine and blood cultures were collected and she was treated with Rocephin, morphine, and Zofran.   Plan of care: The patient is accepted for admission to Med-surg  unit, at [facility].   Author: Briscoe Deutscher, MD 10/21/2023  Check [email or web address] for on-call coverage.  Nursing staff, Please call [facility] Admits & Consults System-Wide number on Amion as soon as patient's arrival, so appropriate admitting provider can evaluate the pt.

## 2023-10-21 NOTE — ED Provider Notes (Signed)
[facility] Provider Note   CSN: [id number] Arrival date & time: 10/20/23  2330     History  Chief Complaint  Patient presents with   Back Pain    Wendy Carrillo is a 82 y.o. female.  82 yo F with a chief complaints of left-sided back pain.  This been going on for a few days.  She actually was seen in the ED a couple days ago and was diagnosed with pyelonephritis.  Since then she has been too nauseated to eat or drink and has had fevers off and on.  Feels like her back pain was initially better after treatment but has reoccurred.   Back Pain      Home Medications Prior to Admission medications   Medication Sig Start Date End Date Taking? Authorizing Provider  ALPRAZolam (XANAX) 0.25 MG tablet Take 1 tablet (0.25 mg total) by mouth daily as needed for anxiety. 10/15/22   Tyrone Nine, MD  apixaban (ELIQUIS) 5 MG TABS tablet Take 1 tablet (5 mg total) by mouth 2 (two) times daily. 10/02/22   Peter Garter, PA  cefpodoxime (VANTIN) 200 MG tablet Take 1 tablet (200 mg total) by mouth 2 (two) times daily. 10/18/23   Tilden Fossa, MD  cyanocobalamin (VITAMIN B12) 1000 MCG tablet Take 1 tablet (1,000 mcg total) by mouth daily. 10/15/22   Tyrone Nine, MD  esomeprazole (NEXIUM) 40 MG capsule Take 40 mg by mouth daily. 09/13/22   [provider]  fluticasone (FLONASE) 50 MCG/ACT nasal spray Place 1 spray into both nostrils daily as needed for allergies or rhinitis. 10/15/22   Tyrone Nine, MD  furosemide (LASIX) 20 MG tablet Take 20 mg by mouth daily as needed for fluid.    [provider]  gabapentin (NEURONTIN) 300 MG capsule TAKE 2 CAPSULES BY MOUTH THREE TIMES PER DAY. CAN SLOWLY INCREASE FROM CURRENT PRESCRIPTION. Patient taking differently: Take 600 mg by mouth daily as needed (for pain). Can slowly increase from current prescription. 10/05/22   Juanda Chance, NP  hydrOXYzine (ATARAX) 10 MG tablet One  tab every 12 hours as needed for itching Patient taking differently: Take 10 mg by mouth daily as needed for itching. 03/25/22   Dorothyann Peng, MD  loratadine (CLARITIN) 10 MG tablet Take 10 mg by mouth daily as needed for allergies. In the morning    [provider]  methocarbamol (ROBAXIN) 500 MG tablet Take 1 tablet (500 mg total) by mouth every 8 (eight) hours as needed for muscle spasms. 12/31/22   Magnant, Joycie Peek, PA-C  Multiple Vitamins-Minerals (AIRBORNE GUMMIES PO) Take 2 tablets by mouth daily.    [provider]  ondansetron (ZOFRAN-ODT) 4 MG disintegrating tablet Take 1 tablet (4 mg total) by mouth every 8 (eight) hours as needed for nausea or vomiting. 10/18/23   Tilden Fossa, MD  predniSONE (DELTASONE) 50 MG tablet Pt to take 50 mg of prednisone on 09/09/23 at 8:00PM, 50 mg of prednisone on 09/10/23 at 2:00AM, and 50 mg of prednisone on 09/10/23 at 8:00AM. Pt is also to take 50 mg of benadryl on 09/10/23 at 8:00AM. Please call [phone] with any questions. 09/09/23   El-Abd, Aaron Edelman, MD  traMADol (ULTRAM) 50 MG tablet Take 1 tablet (50 mg total) by mouth every 12 (twelve) hours as needed for moderate pain or severe pain. 10/15/22   Tyrone Nine, MD  Vitamin D, Ergocalciferol, (DRISDOL) 50000 UNITS CAPS capsule Take  50,000 Units by mouth every Thursday.    [provider]      Allergies    Contrast media [iodinated contrast media], Penicillins, Cyclobenzaprine, Lactose intolerance (gi), Tilactase, Baclofen, Latex, Other, and Tape    Review of Systems   Review of Systems  Musculoskeletal:  Positive for back pain.    Physical Exam Updated Vital Signs BP (!) 143/66 (BP Location: Right Arm)   Pulse 67   Temp 98.1 F (36.7 C)   Resp 18   SpO2 97%  Physical Exam Vitals and nursing note reviewed.  Constitutional:      General: She is not in acute distress.    Appearance: She is well-developed. She is not diaphoretic.  HENT:     Head:  Normocephalic and atraumatic.  Eyes:     Pupils: Pupils are equal, round, and reactive to light.  Cardiovascular:     Rate and Rhythm: Normal rate and regular rhythm.     Heart sounds: No murmur heard.    No friction rub. No gallop.  Pulmonary:     Effort: Pulmonary effort is normal.     Breath sounds: No wheezing or rales.  Abdominal:     General: There is no distension.     Palpations: Abdomen is soft.     Tenderness: There is no abdominal tenderness.  Musculoskeletal:        General: No tenderness.     Cervical back: Normal range of motion and neck supple.     Comments: Mild CVA tenderness on the left.  No obvious intra-abdominal tenderness.  No obvious midline spinal tenderness to also deformities.  Skin:    General: Skin is warm and dry.  Neurological:     Mental Status: She is alert and oriented to person, place, and time.  Psychiatric:        Behavior: Behavior normal.     ED Results / Procedures / Treatments   Labs (all labs ordered are listed, but only abnormal results are displayed) Labs Reviewed  URINALYSIS, ROUTINE W REFLEX MICROSCOPIC - Abnormal; Notable for the following components:      Result Value   Leukocytes,Ua TRACE (*)    All other components within normal limits  BASIC METABOLIC PANEL - Abnormal; Notable for the following components:   Calcium 8.8 (*)    All other components within normal limits  URINALYSIS, MICROSCOPIC (REFLEX) - Abnormal; Notable for the following components:   Bacteria, UA RARE (*)    All other components within normal limits  CULTURE, BLOOD (ROUTINE X 2)  CULTURE, BLOOD (ROUTINE X 2)  URINE CULTURE  CBC WITH DIFFERENTIAL/PLATELET  LACTIC ACID, PLASMA  LACTIC ACID, PLASMA    EKG None  Radiology No results found.  Procedures Procedures    Medications Ordered in ED Medications  cefTRIAXone (ROCEPHIN) 2 g in sodium chloride 0.9 % 100 mL IVPB (2 g Intravenous New Bag/Given 10/21/23 0106)  morphine (PF) 4 MG/ML  injection 4 mg (4 mg Intravenous Given 10/21/23 0111)  ondansetron (ZOFRAN) injection 4 mg (4 mg Intravenous Given 10/21/23 0111)    ED Course/ Medical Decision Making/ A&P                                 Medical Decision Making Amount and/or Complexity of Data Reviewed Labs: ordered.  Risk Prescription drug management.   82 yo F with a chief complaints of left flank pain.  Patient was recently  seen and I reviewed that visit.  She had had a UA that was not obviously infected and had CT imaging that showed some stranding about the left kidney that was concerning for possible pyelonephritis.  Her history is consistent with pyelonephritis with flank pain fevers and chills.  She has not done well as an outpatient.  I think she likely needs inpatient therapy.  Will repeat labs today.  Order IV antibiotics.  UA again today without obvious signs of infection.  No leukocytosis lactate normal.  No significant electrolyte abnormalities.  Discussed with medicine for admission.  The patients results and plan were reviewed and discussed.   Any x-rays performed were independently reviewed by myself.   Differential diagnosis were considered with the presenting HPI.  Medications  cefTRIAXone (ROCEPHIN) 2 g in sodium chloride 0.9 % 100 mL IVPB (2 g Intravenous New Bag/Given 10/21/23 0106)  morphine (PF) 4 MG/ML injection 4 mg (4 mg Intravenous Given 10/21/23 0111)  ondansetron (ZOFRAN) injection 4 mg (4 mg Intravenous Given 10/21/23 0111)    Vitals:   10/20/23 2336  BP: (!) 143/66  Pulse: 67  Resp: 18  Temp: 98.1 F (36.7 C)  SpO2: 97%    Final diagnoses:  Pyelonephritis    Admission/ observation were discussed with the admitting physician, patient and/or family and they are comfortable with the plan.            Final Clinical Impression(s) / ED Diagnoses Final diagnoses:  Pyelonephritis    Rx / DC Orders ED Discharge Orders     None         Melene Plan,  DO 10/21/23 4696

## 2023-10-21 NOTE — Progress Notes (Signed)
Subjective: Patient admitted this morning, see detailed H&P by Dr Haroldine Laws  82 year old female with past medical history significant for HTN, anxiety, chronic back pain, and hx of DVT.  Per patient she has not been feeling well for the past week.  She has had burning urination and frequency  On 12/13 patient came to the [facility] ER. She was diagnosed with pyelonephritis, discharged on p.o. Vantin. Patient states she has been taking the medication with no improvement. Her dysuria, flank pain persists as does her nausea and poor appetite. She returns to the ER. She additionally states her knees have been hurting and feels somewhat swollen. She has chronic arthritis but states this feels a bit worse.    Vitals:   10/21/23 1046 10/21/23 1423  BP: (!) 107/57 (!) 135/59  Pulse: 81 71  Resp: 20 16  Temp: 98.1 F (36.7 C) 98.1 F (36.7 C)  SpO2: 92% 100%      A/P   Pyelonephritis -Failed outpatient therapy -UA on the 13th weakly suggestive of UTI.  No cultures done.  UA today at the same. CT abdomen pelvis on the 13th suggestive of recently passed stone vs pyelonephritis.  Patient diagnosed with pyelonephritis based on symptoms.  Continue IV Rocephin -Urine and blood cultures collected.   Knee/lower extremity pain -No evidence of fluid overload.  BNP 80.2 -No evidence of DVTs.  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   Will obtain MRI lumbar spine as patient with unconvincing UA and normal WBCs.  -Decreased mobility.  PT consult placed   Karlee Staff S Octavia Velador [facility]

## 2023-10-21 NOTE — ED Notes (Signed)
Report called & given to inpt RN [facility]

## 2023-10-21 NOTE — H&P (Addendum)
PCP:   Ralene Ok, MD   Chief Complaint:  Brain urination, flank pain, pyelonephritis  HPI: This is a 82 year old female with past medical history significant for HTN, anxiety, chronic back pain, and hx of DVT.  Per patient she has not been feeling well for the past week.  She has had burning urination and frequency.  She has nausea but no vomiting.  She endorses no appetite.  She states she has had low-grade fevers at home.  She has been very lethargic.  She denies blood in her urine.  She also reports left flank pain.  On 12/13 patient came to the [facility] ER.  She was diagnosed with pyelonephritis, discharged on p.o. Vantin.  Patient states she has been taking the medication with no improvement.  Her dysuria, flank pain persists as does her nausea and poor appetite.  She returns to the ER.  She additionally states her knees have been hurting and feels somewhat swollen.  She has chronic arthritis but states this feels a bit worse.  She states she finds it a bit more difficult getting around.  Patient complains of sciatic pains which she states is not new but feels worse.  At [facility] patient diagnosed with pyelonephritis, failed outpatient treatment.  Given IV Rocephin and transferred for admission.  Review of Systems:  Per HPI   Past Medical History: Past Medical History:  Diagnosis Date   Arthritis    "all over"   Cervicalgia    Chronic low back pain    Chronic lower back pain    Coronary artery calcification    cardiology--- dr t. 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 , does not remember  what type    Hypertension    followed by pcp   Spinal stenosis, lumbar    Ulnar neuropathy of right upper extremity    Wears glasses    Wears partial dentures    upper   Past Surgical History:  Procedure Laterality Date   ABDOMINAL HERNIA REPAIR  1980s   ANTERIOR AND POSTERIOR VAGINAL REPAIR  2015   W/  MID-URETHRAL SLING AND SACROSPINOUS FIXATION   BACK SURGERY     BREAST CYST EXCISION Bilateral    BREAST SURGERY Left    "nipple taken off"   CARPAL TUNNEL RELEASE Bilateral right 2010;  left 2012   CATARACT EXTRACTION W/ INTRAOCULAR LENS IMPLANT Left 2014   CATARACT EXTRACTION W/ INTRAOCULAR LENS IMPLANT Bilateral right 2018;  left ?   CHOLECYSTECTOMY OPEN  1980's   AND APPENDECTOMY   COLONOSCOPY W/ BIOPSIES AND POLYPECTOMY  last one 01/ 2017   DILATION AND CURETTAGE OF UTERUS  "several"   HERNIA REPAIR     LEFT HEART CATH AND CORONARY ANGIOGRAPHY N/A 10/23/2017   Procedure: LEFT HEART CATH AND CORONARY ANGIOGRAPHY;  Surgeon: Lennette Bihari, MD;  Location: [facility];  Service: Cardiovascular;  Laterality: N/A;   LUMBAR LAMINECTOMY/DECOMPRESSION MICRODISCECTOMY Bilateral 06/09/2018   Procedure: Laminectomy and Foraminotomy - Lumbar Two,Lumbar Three - Lumbar One- Two - Lumbar Three-Four- bilateral;  Surgeon: Donalee Citrin, MD;  Location: [facility] OR;  Service: Neurosurgery;  Laterality: Bilateral;   LUMBAR LAMINECTOMY/DECOMPRESSION MICRODISCECTOMY Bilateral 06/01/2020   Procedure: Laminectomy and Foraminotomy - left - Lumbar Two-Lumbar Three -  - bilateral - Lumbar Four-Lumbar Five;  Surgeon: Donalee Citrin, MD;  Location: [facility];  Service: Neurosurgery;  Laterality: Bilateral;  Laminectomy and Foraminotomy - left - Lumbar Two-Lumbar Three -  - bilateral - Lumbar Four-Lumbar Five    LYMPH NODE BIOPSY Left 11/28/2020   Procedure: LEFT GROIN LYMPH NODE EXCISIONAL BIOPSY;  Surgeon: Sheliah Hatch, De Blanch, MD;  Location: [facility];  Service: General;  Laterality: Left;   MULTIPLE TOOTH  EXTRACTIONS     PATELLA FRACTURE SURGERY Left 1990s   "after the replacement"   REDUCTION MAMMAPLASTY Bilateral 2004   TOTAL KNEE ARTHROPLASTY Bilateral left 1992;  right 2001   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 Authorizing Provider  ALPRAZolam (XANAX) 0.25 MG tablet Take 1 tablet (0.25 mg total) by mouth daily as needed for anxiety. 10/15/22   Tyrone Nine, MD  apixaban (ELIQUIS) 5 MG TABS tablet Take 1 tablet (5 mg total) by mouth 2 (two) times daily. 10/02/22   Peter Garter, PA  cefpodoxime (VANTIN) 200 MG tablet Take 1 tablet (200 mg total) by mouth 2 (two) times daily. 10/18/23   Tilden Fossa, MD  cyanocobalamin (VITAMIN B12) 1000 MCG tablet Take 1 tablet (1,000 mcg total) by mouth daily. 10/15/22   Tyrone Nine, MD  esomeprazole (NEXIUM) 40 MG capsule Take 40 mg by mouth daily. 09/13/22   [provider]  fluticasone (FLONASE) 50 MCG/ACT nasal spray Place 1 spray into both nostrils daily as needed for allergies or rhinitis. 10/15/22   Tyrone Nine, MD  furosemide (LASIX) 20 MG tablet Take 20 mg by mouth daily as needed for fluid.    [provider]  gabapentin (NEURONTIN) 300 MG capsule TAKE 2 CAPSULES BY MOUTH THREE TIMES PER DAY. CAN SLOWLY INCREASE FROM CURRENT PRESCRIPTION. Patient taking differently: Take 600 mg by mouth daily as needed (for pain). Can slowly increase from current prescription. 10/05/22   Juanda Chance, NP  hydrOXYzine (ATARAX) 10 MG tablet One tab every 12 hours as needed for itching Patient taking differently: Take 10 mg by mouth daily as needed for itching. 03/25/22   Dorothyann Peng, MD  loratadine (CLARITIN) 10 MG tablet Take 10 mg by mouth daily as needed for allergies. In the morning    [provider]  methocarbamol (ROBAXIN) 500 MG tablet Take 1 tablet (500 mg  total) by mouth every 8 (eight) hours as needed for muscle spasms. 12/31/22   Magnant, Joycie Peek, PA-C  Multiple Vitamins-Minerals (AIRBORNE GUMMIES PO) Take 2 tablets by mouth daily.    [provider]  ondansetron (ZOFRAN-ODT) 4 MG disintegrating tablet Take 1 tablet (4 mg total) by mouth every 8 (eight) hours as needed for nausea or vomiting. 10/18/23   Tilden Fossa, MD  predniSONE (DELTASONE) 50 MG tablet Pt to take 50 mg of prednisone on 09/09/23 at 8:00PM, 50 mg of prednisone on 09/10/23 at 2:00AM, and 50 mg of prednisone on 09/10/23 at 8:00AM. Pt is also to take 50 mg of benadryl on 09/10/23 at 8:00AM. Please call [phone] with any questions. 09/09/23   El-Abd, Aaron Edelman, MD  traMADol (ULTRAM) 50 MG tablet Take 1 tablet (50  mg total) by mouth every 12 (twelve) hours as needed for moderate pain or severe pain. 10/15/22   Tyrone Nine, MD  Vitamin D, Ergocalciferol, (DRISDOL) 50000 UNITS CAPS capsule Take 50,000 Units by mouth every Thursday.    [provider]    Allergies:   Allergies  Allergen Reactions   Contrast Media [Iodinated Contrast Media] Swelling, Rash and Other (See Comments)    Face, tongue swelling Needs 13-hour prep   Penicillins Swelling and Rash    Pt tolerates rocephin.  Given IV 01/18/2023   Cyclobenzaprine    Lactose Intolerance (Gi) Diarrhea   Tilactase Diarrhea   Baclofen Itching   Latex Rash   Other Dermatitis and Other (See Comments)    Bandages caused redness    Tape Dermatitis and Other (See Comments)    Bandages caused redness    Social History:  reports that she has never smoked. She has never used smokeless tobacco. She reports that she does not drink alcohol and does not use drugs.  Family History: Family History  Problem Relation Age of Onset   Hypertension Mother    Stroke Mother    Diabetes Mother    Heart attack Father    Breast cancer Sister    Leukemia Brother    Breast cancer Sister    Breast cancer Sister    Heart  disease Brother     Physical Exam: Vitals:   10/20/23 2336 10/21/23 0405  BP: (!) 143/66 (!) 142/69  Pulse: 67 65  Resp: 18 18  Temp: 98.1 F (36.7 C) 97.9 F (36.6 C)  TempSrc:  Oral  SpO2: 97% 97%    General: ANO x 3, well developed and nourished, no acute distress Eyes: Pink conjunctiva, no scleral icterus ENT: Moist oral mucosa, neck supple, no thyromegaly Lungs: clear to ascultation, no wheeze, no crackles, no use of accessory muscles Cardiovascular: RRR, no regurgitation, no gallops, no murmurs. No carotid bruits, no JVD Abdomen: soft, positive BS, positive tenderness to palpation greatest right lower quadrant.  Positive left flank tenderness to palpation.  Not an acute abdomen GU: not examined Neuro: CN II - XII grossly intact, sensation intact Musculoskeletal: Moves all extremities equally.  Tenderness to palpation nonspecific bilateral knee.  No crepitations appreciated.  No significant swelling appreciated on lower extremities. Skin: no rash, no subcutaneous crepitation, no decubitus Psych: appropriate patient   Labs on Admission:  Recent Labs    10/21/23 0107  NA 140  K 3.8  CL 105  CO2 29  GLUCOSE 96  BUN 13  CREATININE 0.68  CALCIUM 8.8*    Recent Labs    10/21/23 0107  WBC 6.5  NEUTROABS 3.1  HGB 12.2  HCT 38.7  MCV 93.3  PLT 204    Radiological Exams on Admission: No results found.  Assessment/Plan Present on Admission:  Pyelonephritis -Failed outpatient therapy -UA on the 13th weakly suggestive of UTI.  No cultures done.  UA today at the same. CT abdomen pelvis on the 13th suggestive of recently passed stone vs pyelonephritis.  Patient diagnosed with pyelonephritis based on symptoms.  Continue IV Rocephin -Urine and blood cultures collected.  Knee/lower extremity pain -No evidence of fluid overload.  BNP sent -No evidence of DVTs.  D-dimer sent -Await results.   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 Mild-to-moderate hip.  Will obtain MRI lumbar spine as patient with unconvincing UA and normal WBCs.  -Decreased mobility.  PT  consult placed  Maryn Freelove 10/21/2023, 4:11 AM

## 2023-10-21 NOTE — Plan of Care (Signed)

## 2023-10-22 DIAGNOSIS — N12 Tubulo-interstitial nephritis, not specified as acute or chronic: Secondary | ICD-10-CM | POA: Diagnosis not present

## 2023-10-22 DIAGNOSIS — I10 Essential (primary) hypertension: Secondary | ICD-10-CM

## 2023-10-22 LAB — CBC
HCT: 36.7 % (ref 36.0–46.0)
Hemoglobin: 11.2 g/dL — ABNORMAL LOW (ref 12.0–15.0)
MCH: 30.2 pg (ref 26.0–34.0)
MCHC: 30.5 g/dL (ref 30.0–36.0)
MCV: 98.9 fL (ref 80.0–100.0)
Platelets: 177 10*3/uL (ref 150–400)
RBC: 3.71 MIL/uL — ABNORMAL LOW (ref 3.87–5.11)
RDW: 13.4 % (ref 11.5–15.5)
WBC: 4.8 10*3/uL (ref 4.0–10.5)
nRBC: 0 % (ref 0.0–0.2)

## 2023-10-22 LAB — COMPREHENSIVE METABOLIC PANEL
ALT: 9 U/L (ref 0–44)
AST: 11 U/L — ABNORMAL LOW (ref 15–41)
Albumin: 3.1 g/dL — ABNORMAL LOW (ref 3.5–5.0)
Alkaline Phosphatase: 40 U/L (ref 38–126)
Anion gap: 7 (ref 5–15)
BUN: 14 mg/dL (ref 8–23)
CO2: 25 mmol/L (ref 22–32)
Calcium: 8.5 mg/dL — ABNORMAL LOW (ref 8.9–10.3)
Chloride: 105 mmol/L (ref 98–111)
Creatinine, Ser: 0.7 mg/dL (ref 0.44–1.00)
GFR, Estimated: >60 mL/min (ref >60)
Glucose, Bld: 93 mg/dL (ref 70–99)
Potassium: 4.3 mmol/L (ref 3.5–5.1)
Sodium: 137 mmol/L (ref 135–145)
Total Bilirubin: 0.5 mg/dL (ref <1.2)
Total Protein: 5.4 g/dL — ABNORMAL LOW (ref 6.5–8.1)

## 2023-10-22 LAB — URINE CULTURE

## 2023-10-22 LAB — GLUCOSE, CAPILLARY
Glucose-Capillary: 93 mg/dL (ref 70–99)
Glucose-Capillary: 83 mg/dL (ref 70–99)

## 2023-10-22 MED ORDER — METHOCARBAMOL 500 MG PO TABS: 500.0000 mg | ORAL_TABLET | Freq: Three times a day (TID) | ORAL | 0 refills | Status: DC | PRN

## 2023-10-22 MED ORDER — METHOCARBAMOL 500 MG PO TABS
500.0000 mg | ORAL_TABLET | Freq: Four times a day (QID) | ORAL | Status: DC
  Administered 2023-10-22: 500 mg via ORAL
  Filled 2023-10-22: qty 1

## 2023-10-22 MED ORDER — KETOROLAC TROMETHAMINE 15 MG/ML IJ SOLN
15.0000 mg | Freq: Once | INTRAMUSCULAR | Status: AC
  Administered 2023-10-22: 15 mg via INTRAVENOUS
  Filled 2023-10-22: qty 1

## 2023-10-22 NOTE — Progress Notes (Signed)
AVS reviewed w/ pt who verbalized an understanding. No other questions at this time. Pt waiting on husband to bring clothes from home to get dressed. PIV removed as noted.

## 2023-10-22 NOTE — Plan of Care (Signed)

## 2023-10-22 NOTE — Plan of Care (Signed)
  Problem: Clinical Measurements: Goal: Diagnostic test results will improve Outcome: Progressing   Problem: Activity: Goal: Risk for activity intolerance will decrease Outcome: Progressing   Problem: Elimination: Goal: Will not experience complications related to bowel motility Outcome: Progressing   Problem: Pain Management: Goal: General experience of comfort will improve Outcome: Progressing   Problem: Safety: Goal: Ability to remain free from injury will improve Outcome: Progressing

## 2023-10-22 NOTE — TOC Transition Note (Signed)
Transition of Care [facility]) - Discharge Note   Patient Details  Name: Wendy Carrillo MRN: [id number] Date of Birth: April 06, 1941  [facility] CM/SW Contact:  Beckie Busing, RN Phone [phone]  10/22/2023, 12:20 PM   Clinical Narrative:    Patient with discharge orders. Patient has orders for outpatient PT. Cm at bedside to discuss outpatient therapy. Patient states that she is already active with [facility] and will resume when she returns home. There are currently no other needs. TOC will sign off    Barriers to Discharge: No Barriers Identified   Patient Goals and CMS Choice            Discharge Placement                       Discharge Plan and Services Additional resources added to the After Visit Summary for                                       Social Drivers of Health (SDOH) Interventions SDOH Screenings   Food Insecurity: No Food Insecurity (10/21/2023)  Housing: Low Risk  (10/21/2023)  Transportation Needs: No Transportation Needs (10/21/2023)  Utilities: Not At Risk (10/21/2023)  Tobacco Use: Low Risk  (10/20/2023)     Readmission Risk Interventions     No data to display

## 2023-10-22 NOTE — Progress Notes (Signed)
   10/22/23 1219  TOC Brief Assessment  Insurance and Status Reviewed  Patient has primary care physician Yes  Home environment has been reviewed Home with spouse  Prior level of function: Independent  Prior/Current Home Services No current home services  Social Drivers of Health Review SDOH reviewed no interventions necessary  Readmission risk has been reviewed Yes  Transition of care needs no transition of care needs at this time

## 2023-10-22 NOTE — Care Management Obs Status (Signed)
MEDICARE OBSERVATION STATUS NOTIFICATION   Patient Details  Name: Wendy Carrillo MRN: [id number] Date of Birth: 1941/09/15   Medicare Observation Status Notification Given:  Yes    Beckie Busing, RN 10/22/2023, 12:10 PM

## 2023-10-22 NOTE — Discharge Summary (Signed)
Physician Discharge Summary   Patient: Wendy Carrillo MRN: [id number] DOB: [birth year]  Admit date:     10/20/2023  Discharge date: 10/22/23  Discharge Physician: Meredeth Ide   PCP: Ralene Ok, MD   Recommendations at discharge:   Follow-up PCP in 2 weeks  Discharge Diagnoses: Principal Problem:   Pyelonephritis Active Problems:   Essential hypertension   Morbid obesity with BMI of 40.0-44.9, adult (HCC)   Benign essential HTN  Resolved Problems:   * No resolved hospital problems. *  Hospital Course: 82 year old female with past medical history significant for HTN, anxiety, chronic back pain, and hx of DVT.  Per patient she has not been feeling well for the past week.  She has had burning urination and frequency  On 12/13 patient came to the [facility] ER. She was diagnosed with pyelonephritis, discharged on p.o. Vantin. Patient states she has been taking the medication with no improvement. Her dysuria, flank pain persists as does her nausea and poor appetite. She returns to the ER. She additionally states her knees have been hurting and feels somewhat swollen. She has chronic arthritis but states this feels a bit worse.     Assessment and Plan:   Pyelonephritis -Failed outpatient therapy -UA on the 13th weakly suggestive of UTI.  No cultures done.  UA today at the same. CT abdomen pelvis on the 13th suggestive of recently passed stone vs pyelonephritis.  Patient diagnosed with pyelonephritis based on symptoms.  Started on IV Rocephin -Urine culture showed multiple species -Will discontinue IV Rocephin, she can continue taking p.o. Vantin   Knee/lower extremity pain -No evidence of fluid overload.  BNP 80.2 -No evidence of DVTs.  D-dimer is normal, 0.39   Spinal stenosis -CT abdomen pelvis on the 13th also showed acquired spinal stenosis L2-3, L3-4 and L4-5.   Will obtain MRI lumbar spine showed spinal stenosis at L2-3, T10-11 which can explain her right  flank pain -Follow-up neurosurgery as outpatient -She will need outpatient PT        Consultants:  Procedures performed:  Disposition: Home Diet recommendation:  Discharge Diet Orders (From admission, onward)     Start     Ordered   10/22/23 0000  Diet - low sodium heart healthy        10/22/23 1145           Regular diet DISCHARGE MEDICATION: Allergies as of 10/22/2023       Reactions   Contrast Media [iodinated Contrast Media] Swelling, Rash, Other (See Comments)   Face, tongue swelling Needs 13-hour prep   Penicillins Swelling, Rash   Pt tolerates rocephin.  Given IV 01/18/2023   Cyclobenzaprine    Lactose Intolerance (gi) Diarrhea   Tilactase Diarrhea   Baclofen Itching   Latex Rash   Other Dermatitis, Other (See Comments)   Bandages caused redness    Tape Dermatitis, Other (See Comments)   Bandages caused redness        Medication List     STOP taking these medications    loratadine 10 MG tablet Commonly known as: CLARITIN   predniSONE 50 MG tablet Commonly known as: DELTASONE       TAKE these medications    AIRBORNE GUMMIES PO Take 2 tablets by mouth daily.   ALPRAZolam 0.25 MG tablet Commonly known as: XANAX Take 1 tablet (0.25 mg total) by mouth daily as needed for anxiety.   cefpodoxime 200 MG tablet Commonly known as: VANTIN Take 1 tablet (200 mg  total) by mouth 2 (two) times daily.   cyanocobalamin 1000 MCG tablet Commonly known as: VITAMIN B12 Take 1 tablet (1,000 mcg total) by mouth daily.   esomeprazole 40 MG capsule Commonly known as: NEXIUM Take 40 mg by mouth daily.   fluticasone 50 MCG/ACT nasal spray Commonly known as: FLONASE Place 1 spray into both nostrils daily as needed for allergies or rhinitis.   furosemide 20 MG tablet Commonly known as: LASIX Take 20 mg by mouth daily as needed for fluid.   gabapentin 300 MG capsule Commonly known as: NEURONTIN TAKE 2 CAPSULES BY MOUTH THREE TIMES PER DAY. CAN  SLOWLY INCREASE FROM CURRENT PRESCRIPTION. What changed: See the new instructions.   hydrOXYzine 10 MG tablet Commonly known as: ATARAX One tab every 12 hours as needed for itching What changed:  how much to take how to take this when to take this reasons to take this additional instructions   methocarbamol 500 MG tablet Commonly known as: ROBAXIN Take 1 tablet (500 mg total) by mouth every 8 (eight) hours as needed for muscle spasms. What changed:  when to take this reasons to take this   ondansetron 4 MG disintegrating tablet Commonly known as: ZOFRAN-ODT Take 1 tablet (4 mg total) by mouth every 8 (eight) hours as needed for nausea or vomiting.   traMADol 50 MG tablet Commonly known as: ULTRAM Take 1 tablet (50 mg total) by mouth every 12 (twelve) hours as needed for moderate pain or severe pain. What changed: when to take this   Vitamin D (Ergocalciferol) 1.25 MG (50000 UNIT) Caps capsule Commonly known as: DRISDOL Take 50,000 Units by mouth every Thursday.        Follow-up Information     Ralene Ok, MD Follow up in 2 week(s).   Specialty: Internal Medicine Contact information: [address] [place] [address] [phone]                Discharge Exam: Filed Weights   10/21/23 0415  Weight: 95.7 kg   General-appears in no acute distress Heart-S1-S2, regular, no murmur auscultated Lungs-clear to auscultation bilaterally, no wheezing or crackles auscultated Abdomen-soft, nontender, no organomegaly Extremities-no edema in the lower extremities Neuro-alert, oriented x3, no focal deficit noted  Condition at discharge: good  The results of significant diagnostics from this hospitalization (including imaging, microbiology, ancillary and laboratory) are listed below for reference.   Imaging Studies: MR Lumbar Spine W Wo Contrast Result Date: 10/21/2023 CLINICAL DATA:  Back pain.  History of back surgery. EXAM: MRI LUMBAR SPINE WITHOUT AND  WITH CONTRAST TECHNIQUE: Multiplanar and multiecho pulse sequences of the lumbar spine were obtained without and with intravenous contrast. CONTRAST:  10mL GADAVIST GADOBUTROL 1 MMOL/ML IV SOLN COMPARISON:  Lumbar MRI 02/20/2023. Radiographs 09/03/2023. Abdominopelvic CT 10/18/2023. FINDINGS: Segmentation: Conventional anatomy assumed, with the last open disc space designated L5-S1.Concordant with prior imaging. Alignment: Mild scoliosis, convex to the right at L2-3. Stable grade 1 retrolisthesis at T12-L1, L2-3 and L5-S1. Minimal anterolisthesis at L4-5. Vertebrae: No worrisome osseous lesion, acute fracture or pars defect. Multilevel endplate degenerative changes without evidence of discitis or osteomyelitis. Remote laminectomies at L2 and L3. Conus medullaris: Extends to the L1 level and appears normal. No abnormal intradural enhancement. Paraspinal and other soft tissues: No significant paraspinal findings. 7.2 cm simple cyst involving the upper pole of the right kidney for which no specific follow-up imaging is recommended. Chronic atrophy within the lower erector spinae musculature. Disc levels: Sagittal images demonstrate disc bulging and  moderate bilateral facet hypertrophy at T10-11 with resulting mild spinal stenosis and moderate foraminal narrowing bilaterally. T11-12: Mild disc bulging and bilateral facet hypertrophy. No significant spinal stenosis or foraminal narrowing. T12-L1: Chronic loss of disc height with annular disc bulging and endplate osteophytes asymmetric to the right. No significant central spinal stenosis. There is stable chronic narrowing of the right lateral recess, moderate to severe right and mild left foraminal narrowing. L1-2: Chronic loss of disc height with annular disc bulging, endplate osteophytes and bilateral facet hypertrophy. Stable mild multifactorial spinal stenosis with asymmetric narrowing of the left lateral recess and mild to moderate foraminal narrowing bilaterally.  L2-3: Chronic loss of disc height with annular disc bulging, endplate osteophytes and endplate degeneration. Bilateral facet hypertrophy status post bilateral laminectomy. Unchanged residual mild to moderate spinal stenosis with chronic left greater than right lateral recess and foraminal narrowing. L3-4: Stable mild loss of disc height with annular disc bulging and endplate osteophytes asymmetric to the left. Advanced facet hypertrophy status post bilateral laminectomy. Stable residual mild spinal stenosis with mild lateral recess and moderate to severe foraminal narrowing bilaterally. L4-5: Stable mild loss of disc height with mild disc bulging. The facet joints are chronically ankylosed. Unchanged mild spinal stenosis with mild right lateral recess, moderate right foraminal and mild left foraminal narrowing. L5-S1: Chronic loss of disc height with annular disc bulging and endplate osteophytes asymmetric to the right. Moderate bilateral facet hypertrophy. Unchanged chronic foraminal narrowing which appears severe on the right and moderate to severe on the left. No significant central spinal stenosis. IMPRESSION: 1. No acute findings or significant change compared with previous MRI of 8 months ago. No clear explanation for the patient's symptoms. 2. Stable postoperative changes status post bilateral laminectomies at L2 and L3. 3. Stable multilevel spondylosis with resulting spinal stenosis, mild to moderate at L2-3 and mild at L1-2, L3-4 and L4-5. 4. Unchanged chronic multilevel foraminal narrowing, most severe on the right at T10-11, L3-4 and L5-S1, and on the left at L2-3 and L3-4. 5. No evidence of discitis or osteomyelitis. Electronically Signed   By: Carey Bullocks M.D.   On: 10/21/2023 12:37   DG Chest 2 View Result Date: 10/18/2023 CLINICAL DATA:  Chest and epigastric pain. EXAM: CHEST - 2 VIEW COMPARISON:  Portable chest 08/08/2023 FINDINGS: The lungs are clear with mild chronic asymmetric elevation of  the right hemidiaphragm. The heart is slightly enlarged. No vascular congestion is seen. Aortic atherosclerosis. There is mild aortic tortuosity with stable mediastinal configuration. Osteopenia and degenerative change of the spine and shoulders. Cholecystectomy clips. IMPRESSION: No evidence of acute chest disease. Slight cardiomegaly. Aortic atherosclerosis. Electronically Signed   By: Almira Bar M.D.   On: 10/18/2023 06:42   CT ABDOMEN PELVIS WO CONTRAST Result Date: 10/18/2023 CLINICAL DATA:  Left flank pain. EXAM: CT ABDOMEN AND PELVIS WITHOUT CONTRAST TECHNIQUE: Multidetector CT imaging of the abdomen and pelvis was performed following the standard protocol without IV contrast. RADIATION DOSE REDUCTION: This exam was performed according to the departmental dose-optimization program which includes automated exposure control, adjustment of the mA and/or kV according to patient size and/or use of iterative reconstruction technique. COMPARISON:  Multiple CTs dating back to 2001. The 2 most recent are both without contrast and dated 01/18/2023 and 11/11/2021. FINDINGS: Lower chest: The heart is slightly enlarged. There is calcification in the right coronary artery, prior studies showing heavy three-vessel CAD. Small hiatal hernia. There is posterior atelectasis in the lung bases without infiltrates. Hepatobiliary: The upper liver  dome was excluded from the study. The visualized liver is unremarkable without contrast. Status post cholecystectomy again is seen without biliary dilatation. Pancreas: Unremarkable without contrast. Spleen: Unremarkable without contrast. Adrenals, urinary tract: There is no adrenal mass. There is interval increased haziness around the left kidney and renal pelvis. No intrarenal stones are seen. This could be due to a recently passed stone or due to pyelonephritis. 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 No follow-up imaging is recommended. There is no other contour deforming abnormality of either kidney. The bladder is unremarkable for the degree of distention. Stomach/bowel: No dilatation or wall thickening. An appendix is not seen. Uncomplicated sigmoid diverticulosis. Vascular/Lymphatic: Aortic atherosclerosis. No enlarged abdominal or pelvic lymph nodes. There are numerous pelvic phleboliths. Reproductive: Status post hysterectomy. No adnexal masses. Other: Postsurgical change upper abdominal wall. There is no incarcerated hernia. There is no free fluid, free hemorrhage or free air. Musculoskeletal: Dextrorotary scoliosis, osteopenia, and advanced degenerative change lumbar spine. Advanced degenerative change lower thoracic spine also. Acquired spinal stenosis L2-3, L3-4 and L4-5. Mild-to-moderate hip DJD. IMPRESSION: 1. Interval increased haziness around the left kidney and renal pelvis. No urinary stones are seen. This could be due to a recently passed stone or due to pyelonephritis. 2. 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 3. Aortic and coronary artery atherosclerosis. 4. Small hiatal hernia. 5. Uncomplicated diverticulosis. 6. Osteopenia, scoliosis and degenerative change. 7. Acquired spinal stenosis L2-3, L3-4 and L4-5. Aortic Atherosclerosis (ICD10-I70.0). Electronically Signed   By: Almira Bar M.D.   On: 10/18/2023 06:40    Microbiology: Results for orders placed or performed during the hospital encounter of 10/20/23  Blood culture (routine x 2)     Status: None (Preliminary result)   Collection Time: 10/21/23  1:07 AM   Specimen: BLOOD  Result Value Ref Range Status   Specimen Description   Final    BLOOD LEFT ANTECUBITAL Performed at [facility], [address] [place], [place] [address]    Special Requests   Final    BOTTLES DRAWN AEROBIC AND ANAEROBIC Blood Culture adequate volume Performed at [facility], [address] [place], [place] [address]    Culture   Final    NO GROWTH < 24 HOURS Performed at [facility], [address] [place], [place] [address]    Report Status PENDING  Incomplete  Urine Culture     Status: Abnormal   Collection Time: 10/21/23  1:40 AM   Specimen: Urine, Clean Catch  Result Value Ref Range Status   Specimen Description   Final    URINE, CLEAN CATCH Performed at [facility], [address] [place], [place] [address]    Special Requests   Final    NONE Performed at [facility], [address] [place], [place] [address]    Culture MULTIPLE SPECIES PRESENT, SUGGEST RECOLLECTION (A)  Final   Report Status 10/22/2023 FINAL  Final    Labs: CBC: Recent Labs  Lab 10/18/23 0346 10/21/23 0107 10/21/23 0549 10/22/23 0619  WBC 5.9 6.5 5.5 4.8  NEUTROABS  --  3.1 2.6  --   HGB 12.2 12.2 11.8* 11.2*  HCT 38.4 38.7 38.2 36.7  MCV 92.8 93.3 96.7 98.9  PLT 209 204 182 177   Basic Metabolic  Panel: Recent Labs  Lab 10/18/23 0346 10/21/23 0107 10/21/23 0549 10/22/23 0619  NA 139 140 137 137  K 3.5 3.8 3.4* 4.3  CL 107 105 103 105  CO2 26 29 28 25   GLUCOSE 97 96 104* 93  BUN 7* 13 12 14   CREATININE 0.73 0.68 0.74 0.70  CALCIUM 8.9 8.8* 8.6* 8.5*   Liver Function Tests: Recent Labs  Lab 10/18/23 0346 10/22/23 0619  AST 17 11*  ALT 11 9  ALKPHOS 47 40  BILITOT 0.6 0.5  PROT 6.3* 5.4*  ALBUMIN 3.6 3.1*   CBG: Recent Labs  Lab 10/22/23 0808  GLUCAP 93    Discharge time spent: greater than 30 minutes.  Signed: Meredeth Ide, MD [facility] 10/22/2023

## 2023-10-26 LAB — CULTURE, BLOOD (ROUTINE X 2)
Culture: NO GROWTH
Special Requests: ADEQUATE

## 2023-11-04 NOTE — Discharge Instructions (Signed)

## 2023-11-05 ENCOUNTER — Inpatient Hospital Stay
Admission: RE | Admit: 2023-11-05 | Discharge: 2023-11-05 | Disposition: A | Discharge status: Home / Self Care | Payer: Medicare PPO | Source: Ambulatory Visit | Attending: Student | Admitting: Student

## 2023-11-18 ENCOUNTER — Other Ambulatory Visit: Payer: Self-pay | Admitting: Internal Medicine

## 2023-11-18 ENCOUNTER — Ambulatory Visit
Admission: RE | Admit: 2023-11-18 | Discharge: 2023-11-18 | Disposition: A | Discharge status: Home / Self Care | Payer: Medicare PPO | Source: Ambulatory Visit | Attending: Internal Medicine | Admitting: Internal Medicine

## 2023-11-18 DIAGNOSIS — M25571 Pain in right ankle and joints of right foot: Secondary | ICD-10-CM

## 2023-12-03 ENCOUNTER — Ambulatory Visit: Payer: Medicare PPO | Admitting: Physician Assistant

## 2023-12-03 ENCOUNTER — Telehealth: Payer: Self-pay | Admitting: Physician Assistant

## 2023-12-03 ENCOUNTER — Other Ambulatory Visit: Payer: Self-pay | Admitting: Physician Assistant

## 2023-12-03 DIAGNOSIS — M79661 Pain in right lower leg: Secondary | ICD-10-CM | POA: Diagnosis not present

## 2023-12-03 DIAGNOSIS — M79671 Pain in right foot: Secondary | ICD-10-CM | POA: Diagnosis not present

## 2023-12-03 MED ORDER — TRAMADOL HCL 50 MG PO TABS: 50.0000 mg | ORAL_TABLET | Freq: Two times a day (BID) | ORAL | 0 refills | Status: DC | PRN

## 2023-12-03 NOTE — Progress Notes (Signed)
Office Visit Note   Patient: Wendy Carrillo           Date of Birth: 1941/10/19           MRN: [id number] Visit Date: 12/03/2023              Requested by: Ralene Ok, MD [address] [place],  [place] [address] PCP: Ralene Ok, MD   Assessment & Plan: Visit Diagnoses:  1. Pain in right foot   2. Pain in right lower leg     Plan: Impression is symptomatic right foot advanced arthritis to the second and third TMT joint.  At this point, she is not getting any relief from conservative treatment.  We have discussed referral to Dr. Lajoyce Corners for further evaluation and treatment recommendation.  She will call with any concerns or questions in the meantime.  Follow-Up Instructions: Return for f/u with Dr. Lajoyce Corners.   Orders:  Orders Placed This Encounter  Procedures   VAS [place] LOWER EXTREMITY VENOUS (DVT)   No orders of the defined types were placed in this encounter.     Procedures: No procedures performed   Clinical Data: No additional findings.   Subjective: Chief Complaint  Patient presents with   Right Foot - Pain    HPI patient is a pleasant 83 year old female who comes in today with right foot pain for the past several months which has worsened over the past 2 months or so.  She denies any recent injury but notes that several years ago she dropped a heavy pot on the top of her foot.  The pain she is now having is all to the dorsum of the foot and is constant.  Walking seems to aggravate this the most.  She has tried Tylenol, tramadol, gabapentin and topical Voltaren without significant relief.  Recent x-rays showed moderate to severe second and third tarsometatarsal arthritis.  Review of Systems as detailed in HPI.  All others reviewed and are negative.   Objective: Vital Signs: There were no vitals taken for this visit.  Physical Exam well-developed well-nourished female no acute distress.  Alert and oriented x 3.  Ortho Exam right foot exam: She does have  moderate tenderness and a palpable nodule to the dorsum of the midfoot.  Slight pain to the area with dorsiflexion.  She is neurovascularly intact distally.  Specialty Comments:  No specialty comments available.  Imaging: No new imaging   PMFS History: Patient Active Problem List   Diagnosis Date Noted   Pyelonephritis 10/21/2023   Pain in left hip 09/03/2023   Pleuritic chest pain 10/11/2022   Pyuria 10/11/2022   Hypotension 10/11/2022   Spinal stenosis at L4-L5 level 05/18/2019   Benign essential HTN    History of total bilateral knee replacement (TKR)    Acute blood loss anemia    Post-operative pain    Spinal stenosis of lumbar region 06/09/2018   Spinal stenosis, lumbar    Hypertension    History of duodenal ulcer    Hepatitis    Gastritis    Family history of adverse reaction to anesthesia    Chronic lower back pain    Arthritis    Coronary artery calcification 10/21/2017   Combined forms of age-related cataract of right eye 04/18/2017   Status post total bilateral knee replacement 06/08/2016   Left lumbar radiculopathy 05/09/2016   Bilateral shoulder pain 03/22/2016   DDD (degenerative disc disease), cervical 03/22/2016   Morbid obesity with BMI of 40.0-44.9, adult (HCC)  03/08/2016   Spondylolisthesis, 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/2016   Neck pain 03/08/2016   Lumbar stenosis 03/08/2016   Cervical spinal stenosis 03/08/2016   Back pain 11/16/2015   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/10/2015   Angina pectoris (HCC) 04/20/2015   Morbid obesity (HCC) 04/20/2015   Dyspnea 04/20/2015   Atypical chest pain 04/20/2015   Esophageal reflux 02/23/2015   Essential hypertension 02/23/2015   Chronic back pain 02/23/2015   Chest pain 02/22/2015   Abdominal tenderness, LLQ (left lower quadrant) 06/15/2014   Acute sinusitis 06/15/2014   Allergic rhinitis due to pollen 06/15/2014   Bladder disorder 06/15/2014   Epistaxis 06/15/2014   Female stress incontinence 06/15/2014    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   Insomnia 06/15/2014   Localized primary osteoarthritis of wrist 06/15/2014   Lumbosacral spondylosis 06/15/2014   Osteoarthrosis, localized, primary, hand 06/15/2014   Referred otalgia 06/15/2014   Tingling 06/15/2014   Tinnitus 06/15/2014   Urinary incontinence 06/15/2014   Cystocele 05/04/2014   Retention of urine 05/04/2014   Cystocele, lateral 04/12/2014   Rectocele 03/17/2014   History of blood transfusion 11/05/1990   Past Medical History:  Diagnosis Date   Arthritis    "all over"   Cervicalgia    Chronic low back pain    Chronic lower back pain    Coronary artery calcification    cardiology--- dr t. 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 , does not remember  what type   Hypertension    followed by pcp   Spinal stenosis, lumbar    Ulnar neuropathy of right upper extremity    Wears glasses    Wears partial dentures    upper    Family History  Problem Relation Age of Onset   Hypertension Mother    Stroke Mother    Diabetes Mother    Heart attack Father    Breast cancer Sister    Leukemia Brother    Breast cancer Sister    Breast cancer Sister    Heart disease Brother     Past Surgical History:  Procedure Laterality Date   ABDOMINAL HERNIA REPAIR  1980s   ANTERIOR AND POSTERIOR VAGINAL REPAIR  2015   W/  MID-URETHRAL SLING AND SACROSPINOUS FIXATION   BACK SURGERY     BREAST CYST EXCISION Bilateral    BREAST SURGERY Left    "nipple taken off"   CARPAL TUNNEL RELEASE Bilateral right 2010;  left 2012   CATARACT EXTRACTION W/ INTRAOCULAR LENS IMPLANT Left 2014   CATARACT  EXTRACTION W/ INTRAOCULAR LENS IMPLANT Bilateral right 2018;  left ?   CHOLECYSTECTOMY OPEN  1980's   AND APPENDECTOMY   COLONOSCOPY W/ BIOPSIES AND POLYPECTOMY  last one 01/ 2017   DILATION AND CURETTAGE OF UTERUS  "several"   HERNIA REPAIR     LEFT HEART CATH AND CORONARY ANGIOGRAPHY N/A 10/23/2017   Procedure: LEFT HEART CATH AND CORONARY ANGIOGRAPHY;  Surgeon: Lennette Bihari, MD;  Location: [facility];  Service: Cardiovascular;  Laterality: N/A;   LUMBAR LAMINECTOMY/DECOMPRESSION MICRODISCECTOMY Bilateral 06/09/2018   Procedure: Laminectomy and Foraminotomy -  Lumbar Two,Lumbar Three - Lumbar One- Two - Lumbar Three-Four- bilateral;  Surgeon: Donalee Citrin, MD;  Location: [facility] OR;  Service: Neurosurgery;  Laterality: Bilateral;   LUMBAR LAMINECTOMY/DECOMPRESSION MICRODISCECTOMY Bilateral 06/01/2020   Procedure: Laminectomy and Foraminotomy - left - Lumbar Two-Lumbar Three -  - bilateral - Lumbar Four-Lumbar Five;  Surgeon: Donalee Citrin, MD;  Location: [facility] OR;  Service: Neurosurgery;  Laterality: Bilateral;  Laminectomy and Foraminotomy - left - Lumbar Two-Lumbar Three -  - bilateral - Lumbar Four-Lumbar Five    LYMPH NODE BIOPSY Left 11/28/2020   Procedure: LEFT GROIN LYMPH NODE EXCISIONAL BIOPSY;  Surgeon: Sheliah Hatch, De Blanch, MD;  Location: [facility] [place];  Service: General;  Laterality: Left;   MULTIPLE TOOTH EXTRACTIONS     PATELLA FRACTURE SURGERY Left 1990s   "after the replacement"   REDUCTION MAMMAPLASTY Bilateral 2004   TOTAL KNEE ARTHROPLASTY Bilateral left 1992;  right 2001   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

## 2023-12-03 NOTE — Telephone Encounter (Signed)
Patient request something for pain  [facility] [address]

## 2023-12-03 NOTE — Telephone Encounter (Signed)
Sent in tramadol to [facility]

## 2023-12-04 ENCOUNTER — Ambulatory Visit (HOSPITAL_COMMUNITY)
Admission: RE | Admit: 2023-12-04 | Discharge: 2023-12-04 | Disposition: A | Discharge status: Home / Self Care | Payer: Medicare PPO | Source: Ambulatory Visit | Attending: Physician Assistant | Admitting: Physician Assistant

## 2023-12-04 ENCOUNTER — Telehealth: Payer: Self-pay

## 2023-12-04 DIAGNOSIS — M79661 Pain in right lower leg: Secondary | ICD-10-CM | POA: Diagnosis present

## 2023-12-04 NOTE — Telephone Encounter (Signed)
Greg with Vascular at [facility] wanted to let Mardella Layman know that patient is Negative for DVT, right LE.  Thank you.

## 2023-12-04 NOTE — Progress Notes (Signed)
Right lower extremity venous duplex has been completed. Preliminary results can be found in CV Proc through chart review.  Results were given to April at [facility] office.  12/04/23 1:15 PM Olen Cordial RVT

## 2023-12-04 NOTE — Telephone Encounter (Signed)
Ok, thanks.

## 2023-12-16 ENCOUNTER — Other Ambulatory Visit: Payer: Self-pay

## 2023-12-16 ENCOUNTER — Ambulatory Visit: Payer: Medicare PPO | Admitting: Orthopedic Surgery

## 2023-12-16 DIAGNOSIS — M79672 Pain in left foot: Secondary | ICD-10-CM | POA: Diagnosis not present

## 2023-12-16 DIAGNOSIS — M79671 Pain in right foot: Secondary | ICD-10-CM | POA: Diagnosis not present

## 2023-12-16 NOTE — Progress Notes (Signed)
 Office Visit Note   Patient: Wendy Carrillo           Date of Birth: Aug 31, 1941           MRN: [id number] Visit Date: 12/16/2023              Requested by: Ralene Ok, MD [id number] [place],  [place] [address] PCP: Ralene Ok, MD  Chief Complaint  Patient presents with   Right Foot - Pain   Left Foot - Pain      HPI: Patient is an 83 year old woman who is seen for initial evaluation for right midfoot pain.  Patient currently ambulates with a cane.  Patient denies any recent trauma.  States that last May she jumped and injured her foot.  Pain is worse in the right foot than the left.  She has used tramadol.  Assessment & Plan: Visit Diagnoses:  1. Pain in left foot   2. Pain in right foot     Plan: Recommended a stiff soled sneaker she can also use a carbon orthotic to stiffen her sneakers and use Voltaren gel.  Discussed that if she failed conservative treatment surgical intervention would include a fusion across the base of the metatarsal first and second.  Follow-Up Instructions: Return if symptoms worsen or fail to improve.   Ortho Exam  Patient is alert, oriented, no adenopathy, well-dressed, normal affect, normal respiratory effort. Examination patient has a good dorsalis pedis pulse bilaterally.  Patient has pain to palpation across the midfoot.  Right worse than left.  Review of the radiographs patient has osteophytic bone spurs and joint space collapse across the base of the first and second metatarsals with dorsal osteophytic bone spurs.  Patient also has a long second and third metatarsal.  Imaging: No results found. No images are attached to the encounter.  Labs: Lab Results  Component Value Date   ESRSEDRATE 2 08/02/2021   CRP 4.5 08/02/2021   REPTSTATUS 10/22/2023 FINAL 10/21/2023   CULT MULTIPLE SPECIES PRESENT, SUGGEST RECOLLECTION (A) 10/21/2023   LABORGA PSEUDOMONAS AERUGINOSA (A) 10/11/2022     Lab Results  Component Value Date    ALBUMIN 3.1 (L) 10/22/2023   ALBUMIN 3.6 10/18/2023   ALBUMIN 3.1 (L) 10/10/2022    No results found for: "MG" No results found for: "VD25OH"  No results found for: "PREALBUMIN"    Latest Ref Rng & Units 10/22/2023    6:19 AM 10/21/2023    5:49 AM 10/21/2023    1:07 AM  CBC EXTENDED  WBC 4.0 - 10.5 K/uL 4.8  5.5  6.5   RBC 3.87 - 5.11 MIL/uL 3.71  3.95  4.15   Hemoglobin 12.0 - 15.0 g/dL 19.1  47.8  29.5   HCT 36.0 - 46.0 % 36.7  38.2  38.7   Platelets 150 - 400 K/uL 177  182  204   NEUT# 1.7 - 7.7 K/uL  2.6  3.1   Lymph# 0.7 - 4.0 K/uL  2.1  2.5      There is no height or weight on file to calculate BMI.  Orders:  No orders of the defined types were placed in this encounter.  No orders of the defined types were placed in this encounter.    Procedures: No procedures performed  Clinical Data: No additional findings.  ROS:  All other systems negative, except as noted in the HPI. Review of Systems  Objective: Vital Signs: There were no vitals taken for this visit.  Specialty Comments:  No specialty comments available.  PMFS History: Patient Active Problem List   Diagnosis Date Noted   Pyelonephritis 10/21/2023   Pain in left hip 09/03/2023   Pleuritic chest pain 10/11/2022   Pyuria 10/11/2022   Hypotension 10/11/2022   Spinal stenosis at L4-L5 level 05/18/2019   Benign essential HTN    History of total bilateral knee replacement (TKR)    Acute blood loss anemia    Post-operative pain    Spinal stenosis of lumbar region 06/09/2018   Spinal stenosis, lumbar    Hypertension    History of duodenal ulcer    Hepatitis    Gastritis    Family history of adverse reaction to anesthesia    Chronic lower back pain    Arthritis    Coronary artery calcification 10/21/2017   Combined forms of age-related cataract of right eye 04/18/2017   Status post total bilateral knee replacement 06/08/2016   Left lumbar radiculopathy 05/09/2016   Bilateral shoulder pain  03/22/2016   DDD (degenerative disc disease), cervical 03/22/2016   Morbid obesity with BMI of 40.0-44.9, adult (HCC) 03/08/2016   Spondylolisthesis, 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/2016   Neck pain 03/08/2016   Lumbar stenosis 03/08/2016   Cervical spinal stenosis 03/08/2016   Back pain 11/16/2015   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/10/2015   Angina pectoris (HCC) 04/20/2015   Morbid obesity (HCC) 04/20/2015   Dyspnea 04/20/2015   Atypical chest pain 04/20/2015   Esophageal reflux 02/23/2015   Essential hypertension 02/23/2015   Chronic back pain 02/23/2015   Chest pain 02/22/2015   Abdominal tenderness, LLQ (left lower quadrant) 06/15/2014   Acute sinusitis 06/15/2014   Allergic rhinitis due to pollen 06/15/2014   Bladder disorder 06/15/2014   Epistaxis 06/15/2014   Female stress incontinence 06/15/2014   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   Insomnia 06/15/2014   Localized primary osteoarthritis of wrist 06/15/2014   Lumbosacral spondylosis 06/15/2014   Osteoarthrosis, localized, primary, hand 06/15/2014   Referred otalgia 06/15/2014   Tingling 06/15/2014   Tinnitus 06/15/2014   Urinary incontinence 06/15/2014   Cystocele 05/04/2014   Retention of urine 05/04/2014   Cystocele, lateral 04/12/2014   Rectocele 03/17/2014   History of blood transfusion 11/05/1990   Past Medical History:  Diagnosis Date   Arthritis    "all over"   Cervicalgia    Chronic low back pain    Chronic lower back pain    Coronary artery calcification    cardiology--- dr t. 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 , does not remember  what type   Hypertension     followed by pcp   Spinal stenosis, lumbar    Ulnar neuropathy of right upper extremity    Wears glasses    Wears partial dentures    upper    Family History  Problem Relation Age of Onset   Hypertension Mother    Stroke Mother    Diabetes Mother    Heart attack Father    Breast cancer Sister    Leukemia Brother    Breast cancer Sister    Breast cancer Sister  Heart disease Brother     Past Surgical History:  Procedure Laterality Date   ABDOMINAL HERNIA REPAIR  1980s   ANTERIOR AND POSTERIOR VAGINAL REPAIR  2015   W/  MID-URETHRAL SLING AND SACROSPINOUS FIXATION   BACK SURGERY     BREAST CYST EXCISION Bilateral    BREAST SURGERY Left    "nipple taken off"   CARPAL TUNNEL RELEASE Bilateral right 2010;  left 2012   CATARACT EXTRACTION W/ INTRAOCULAR LENS IMPLANT Left 2014   CATARACT EXTRACTION W/ INTRAOCULAR LENS IMPLANT Bilateral right 2018;  left ?   CHOLECYSTECTOMY OPEN  1980's   AND APPENDECTOMY   COLONOSCOPY W/ BIOPSIES AND POLYPECTOMY  last one 01/ 2017   DILATION AND CURETTAGE OF UTERUS  "several"   HERNIA REPAIR     LEFT HEART CATH AND CORONARY ANGIOGRAPHY N/A 10/23/2017   Procedure: LEFT HEART CATH AND CORONARY ANGIOGRAPHY;  Surgeon: Lennette Bihari, MD;  Location: [facility];  Service: Cardiovascular;  Laterality: N/A;   LUMBAR LAMINECTOMY/DECOMPRESSION MICRODISCECTOMY Bilateral 06/09/2018   Procedure: Laminectomy and Foraminotomy - Lumbar Two,Lumbar Three - Lumbar One- Two - Lumbar Three-Four- bilateral;  Surgeon: Donalee Citrin, MD;  Location: [facility] OR;  Service: Neurosurgery;  Laterality: Bilateral;   LUMBAR LAMINECTOMY/DECOMPRESSION MICRODISCECTOMY Bilateral 06/01/2020   Procedure: Laminectomy and Foraminotomy - left - Lumbar Two-Lumbar Three -  - bilateral - Lumbar Four-Lumbar Five;  Surgeon: Donalee Citrin, MD;  Location: [facility] OR;  Service: Neurosurgery;  Laterality: Bilateral;  Laminectomy and Foraminotomy - left - Lumbar Two-Lumbar Three -  - bilateral - Lumbar  Four-Lumbar Five    LYMPH NODE BIOPSY Left 11/28/2020   Procedure: LEFT GROIN LYMPH NODE EXCISIONAL BIOPSY;  Surgeon: Sheliah Hatch, De Blanch, MD;  Location: [facility];  Service: General;  Laterality: Left;   MULTIPLE TOOTH EXTRACTIONS     PATELLA FRACTURE SURGERY Left 1990s   "after the replacement"   REDUCTION MAMMAPLASTY Bilateral 2004   TOTAL KNEE ARTHROPLASTY Bilateral left 1992;  right 2001   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

## 2023-12-26 ENCOUNTER — Encounter: Payer: Self-pay | Admitting: Orthopedic Surgery

## 2024-01-08 ENCOUNTER — Emergency Department (HOSPITAL_BASED_OUTPATIENT_CLINIC_OR_DEPARTMENT_OTHER)

## 2024-01-08 ENCOUNTER — Emergency Department (HOSPITAL_BASED_OUTPATIENT_CLINIC_OR_DEPARTMENT_OTHER)
Admission: EM | Admit: 2024-01-08 | Discharge: 2024-01-08 | Disposition: A | Discharge status: Home / Self Care | Attending: Emergency Medicine | Admitting: Emergency Medicine

## 2024-01-08 ENCOUNTER — Encounter (HOSPITAL_BASED_OUTPATIENT_CLINIC_OR_DEPARTMENT_OTHER): Payer: Self-pay

## 2024-01-08 ENCOUNTER — Other Ambulatory Visit: Payer: Self-pay

## 2024-01-08 DIAGNOSIS — I2510 Atherosclerotic heart disease of native coronary artery without angina pectoris: Secondary | ICD-10-CM | POA: Insufficient documentation

## 2024-01-08 DIAGNOSIS — R911 Solitary pulmonary nodule: Secondary | ICD-10-CM | POA: Insufficient documentation

## 2024-01-08 DIAGNOSIS — Z91010 Allergy to peanuts: Secondary | ICD-10-CM | POA: Diagnosis not present

## 2024-01-08 DIAGNOSIS — R109 Unspecified abdominal pain: Secondary | ICD-10-CM | POA: Insufficient documentation

## 2024-01-08 DIAGNOSIS — M79601 Pain in right arm: Secondary | ICD-10-CM | POA: Diagnosis not present

## 2024-01-08 LAB — COMPREHENSIVE METABOLIC PANEL
ALT: 6 U/L (ref 0–44)
AST: 13 U/L — ABNORMAL LOW (ref 15–41)
Albumin: 4.2 g/dL (ref 3.5–5.0)
Alkaline Phosphatase: 53 U/L (ref 38–126)
Anion gap: 8 (ref 5–15)
BUN: 10 mg/dL (ref 8–23)
CO2: 27 mmol/L (ref 22–32)
Calcium: 9.1 mg/dL (ref 8.9–10.3)
Chloride: 106 mmol/L (ref 98–111)
Creatinine, Ser: 0.83 mg/dL (ref 0.44–1.00)
GFR, Estimated: >60 mL/min (ref >60)
Glucose, Bld: 98 mg/dL (ref 70–99)
Potassium: 4.4 mmol/L (ref 3.5–5.1)
Sodium: 141 mmol/L (ref 135–145)
Total Bilirubin: 0.7 mg/dL (ref 0.0–1.2)
Total Protein: 6.5 g/dL (ref 6.5–8.1)

## 2024-01-08 LAB — LIPASE, BLOOD: Lipase: <10 U/L — ABNORMAL LOW (ref 11–51)

## 2024-01-08 LAB — URINALYSIS, ROUTINE W REFLEX MICROSCOPIC
Bacteria, UA: NONE SEEN
Bilirubin Urine: NEGATIVE
Glucose, UA: NEGATIVE mg/dL
Hgb urine dipstick: NEGATIVE
Ketones, ur: NEGATIVE mg/dL
Nitrite: NEGATIVE
Protein, ur: NEGATIVE mg/dL
Specific Gravity, Urine: 1.011 (ref 1.005–1.030)
pH: 5.5 (ref 5.0–8.0)

## 2024-01-08 LAB — CBC
HCT: 39.8 % (ref 36.0–46.0)
Hemoglobin: 12.7 g/dL (ref 12.0–15.0)
MCH: 29.3 pg (ref 26.0–34.0)
MCHC: 31.9 g/dL (ref 30.0–36.0)
MCV: 91.7 fL (ref 80.0–100.0)
Platelets: 196 10*3/uL (ref 150–400)
RBC: 4.34 MIL/uL (ref 3.87–5.11)
RDW: 13.2 % (ref 11.5–15.5)
WBC: 5.2 10*3/uL (ref 4.0–10.5)
nRBC: 0 % (ref 0.0–0.2)

## 2024-01-08 LAB — TROPONIN I (HIGH SENSITIVITY)
Troponin I (High Sensitivity): 6 ng/L (ref <18)
Troponin I (High Sensitivity): 5 ng/L (ref <18)

## 2024-01-08 MED ORDER — OXYCODONE-ACETAMINOPHEN 5-325 MG PO TABS
1.0000 | ORAL_TABLET | ORAL | Status: DC | PRN
  Filled 2024-01-08: qty 1

## 2024-01-08 MED ORDER — HYDROCODONE-ACETAMINOPHEN 5-325 MG PO TABS
1.0000 | ORAL_TABLET | ORAL | Status: AC
  Administered 2024-01-08: 1 via ORAL
  Filled 2024-01-08: qty 1

## 2024-01-08 MED ORDER — ONDANSETRON HCL 4 MG/2ML IJ SOLN
4.0000 mg | Freq: Once | INTRAMUSCULAR | Status: AC | PRN
  Administered 2024-01-08: 4 mg via INTRAVENOUS
  Filled 2024-01-08: qty 2

## 2024-01-08 MED ORDER — HYDROMORPHONE HCL 1 MG/ML IJ SOLN
0.5000 mg | Freq: Once | INTRAMUSCULAR | Status: AC
  Administered 2024-01-08: 0.5 mg via INTRAVENOUS
  Filled 2024-01-08: qty 1

## 2024-01-08 MED ORDER — LIDOCAINE 5 % EX PTCH
1.0000 | MEDICATED_PATCH | CUTANEOUS | Status: DC
  Administered 2024-01-08: 1 via TRANSDERMAL
  Filled 2024-01-08: qty 1

## 2024-01-08 NOTE — ED Triage Notes (Signed)
 Right flank and arm pain. Some increase urgency in urination. Mild chest pressure. Decreased appetite.

## 2024-01-08 NOTE — ED Notes (Signed)
 Patient transported to CT

## 2024-01-08 NOTE — Discharge Instructions (Signed)
 You were seen for your flank and arm pain in the emergency department.   At home, please continue your home pain medications.    Check your MyChart online for the results of any tests that had not resulted by the time you left the emergency department.   Follow-up with your primary doctor in 2-3 days regarding your visit.    Return immediately to the emergency department if you experience any of the following: Bowel or bladder incontinence, new leg weakness or numbness, or any other concerning symptoms.    Thank you for visiting our Emergency Department. It was a pleasure taking care of you today.

## 2024-01-08 NOTE — ED Provider Notes (Signed)
 [place] EMERGENCY DEPARTMENT AT [facility] Provider Note   CSN: [id number] Arrival date & time: 01/08/24  1655     History {Add pertinent medical, surgical, social history, OB history to HPI:1} No chief complaint on file.   Wendy Carrillo is a 83 y.o. female.  83 year old female with a history of chronic pain, right ulnar neuropathy, pyelonephritis, and nonobstructive CAD who presents emergency department with right-sided flank pain and arm pain.  Patient reports that for the past 2 days she has been having right sided flank pain.  Worsened with movement of her torso.  Unable to ambulate long distances.  Has had nausea but no vomiting.  Thinks she may have gotten sweaty at 1 point in time.  Says that she also is having pain in her arm as well.  Describes the pain as a "hard" pain but has difficulty characterizing further.  10/10 in severity.  Intermittent.  Denies any neck or back pain.  Denies any dysuria or frequency.  No fevers.       Home Medications Prior to Admission medications   Medication Sig Start Date End Date Taking? Authorizing Provider  ALPRAZolam (XANAX) 0.25 MG tablet Take 1 tablet (0.25 mg total) by mouth daily as needed for anxiety. 10/15/22   Tyrone Nine, MD  cefpodoxime (VANTIN) 200 MG tablet Take 1 tablet (200 mg total) by mouth 2 (two) times daily. 10/18/23   Tilden Fossa, MD  cyanocobalamin (VITAMIN B12) 1000 MCG tablet Take 1 tablet (1,000 mcg total) by mouth daily. 10/15/22   Tyrone Nine, MD  esomeprazole (NEXIUM) 40 MG capsule Take 40 mg by mouth daily. 09/13/22   [provider]  fluticasone (FLONASE) 50 MCG/ACT nasal spray Place 1 spray into both nostrils daily as needed for allergies or rhinitis. 10/15/22   Tyrone Nine, MD  furosemide (LASIX) 20 MG tablet Take 20 mg by mouth daily as needed for fluid.    [provider]  gabapentin (NEURONTIN) 300 MG capsule TAKE 2 CAPSULES BY MOUTH THREE TIMES PER DAY. CAN SLOWLY  INCREASE FROM CURRENT PRESCRIPTION. Patient taking differently: Take 600 mg by mouth daily as needed (for pain). 10/05/22   Juanda Chance, NP  hydrOXYzine (ATARAX) 10 MG tablet One tab every 12 hours as needed for itching Patient taking differently: Take 10 mg by mouth daily as needed for itching. 03/25/22   Dorothyann Peng, MD  methocarbamol (ROBAXIN) 500 MG tablet Take 1 tablet (500 mg total) by mouth every 8 (eight) hours as needed for muscle spasms. 10/22/23   Meredeth Ide, MD  Multiple Vitamins-Minerals (AIRBORNE GUMMIES PO) Take 2 tablets by mouth daily.    [provider]  ondansetron (ZOFRAN-ODT) 4 MG disintegrating tablet Take 1 tablet (4 mg total) by mouth every 8 (eight) hours as needed for nausea or vomiting. 10/18/23   Tilden Fossa, MD  traMADol (ULTRAM) 50 MG tablet Take 1 tablet (50 mg total) by mouth every 12 (twelve) hours as needed for moderate pain or severe pain. Patient taking differently: Take 50 mg by mouth 2 (two) times daily. 10/15/22   Tyrone Nine, MD  traMADol (ULTRAM) 50 MG tablet Take 1 tablet (50 mg total) by mouth 2 (two) times daily as needed. 12/03/23   Cristie Hem, PA-C  Vitamin D, Ergocalciferol, (DRISDOL) 50000 UNITS CAPS capsule Take 50,000 Units by mouth every Thursday.    [provider]      Allergies    Contrast media [iodinated contrast media],  Penicillins, Cyclobenzaprine, Lactose intolerance (gi), Tilactase, Baclofen, Latex, Other, and Tape    Review of Systems   Review of Systems  Physical Exam Updated Vital Signs BP (!) 143/84   Pulse 70   Temp 97.8 F (36.6 C)   Resp 17   SpO2 98%  Physical Exam Vitals and nursing note reviewed.  Constitutional:      General: She is not in acute distress.    Appearance: She is well-developed.  HENT:     Head: Normocephalic and atraumatic.     Right Ear: External ear normal.     Left Ear: External ear normal.     Nose: Nose normal.  Eyes:     Extraocular Movements:  Extraocular movements intact.     Conjunctiva/sclera: Conjunctivae normal.     Pupils: Pupils are equal, round, and reactive to light.  Cardiovascular:     Rate and Rhythm: Normal rate and regular rhythm.     Heart sounds: No murmur heard.    Comments: Radial pulses 2+ bilaterally Pulmonary:     Effort: Pulmonary effort is normal. No respiratory distress.     Breath sounds: Normal breath sounds.  Abdominal:     General: Abdomen is flat. There is no distension.     Palpations: Abdomen is soft. There is no mass.     Tenderness: There is abdominal tenderness (Right mid abdomen). There is right CVA tenderness. There is no left CVA tenderness or guarding.  Musculoskeletal:     Cervical back: Normal range of motion and neck supple.  Skin:    General: Skin is warm and dry.  Neurological:     Mental Status: She is alert. Mental status is at baseline.  Psychiatric:        Mood and Affect: Mood normal.     ED Results / Procedures / Treatments   Labs (all labs ordered are listed, but only abnormal results are displayed) Labs Reviewed  CBC  LIPASE, BLOOD  COMPREHENSIVE METABOLIC PANEL  URINALYSIS, ROUTINE W REFLEX MICROSCOPIC  TROPONIN I (HIGH SENSITIVITY)    EKG EKG Interpretation Date/Time:  Wednesday January 08 2024 17:11:03 EST Ventricular Rate:  69 PR Interval:  156 QRS Duration:  82 QT Interval:  400 QTC Calculation: 428 R Axis:   -36  Text Interpretation: Sinus rhythm with Premature supraventricular complexes Left axis deviation Minimal voltage criteria for LVH, may be normal variant ( R in aVL ) Nonspecific ST and T wave abnormality Abnormal ECG When compared with ECG of 21-Oct-2023 07:01, Nonspecific T wave abnormality now evident in Anterolateral leads Confirmed by Vonita Moss [phone]) on 01/08/2024 5:27:15 PM  Radiology No results found.  Procedures Procedures  {Document cardiac monitor, telemetry assessment procedure when appropriate:1}  Medications Ordered in  ED Medications  ondansetron (ZOFRAN) injection 4 mg (4 mg Intravenous Given 01/08/24 1716)    ED Course/ Medical Decision Making/ A&P   {   Click here for ABCD2, HEART and other calculatorsREFRESH Note before signing :1}                              Medical Decision Making Amount and/or Complexity of Data Reviewed Labs: ordered. Radiology: ordered.  Risk Prescription drug management.   ***  {Document critical care time when appropriate:1} {Document review of labs and clinical decision tools ie heart score, Chads2Vasc2 etc:1}  {Document your independent review of radiology images, and any outside records:1} {Document your discussion with family members, caretakers,  and with consultants:1} {Document social determinants of health affecting pt's care:1} {Document your decision making why or why not admission, treatments were needed:1} Final Clinical Impression(s) / ED Diagnoses Final diagnoses:  None    Rx / DC Orders ED Discharge Orders     None

## 2024-01-25 ENCOUNTER — Other Ambulatory Visit: Payer: Self-pay | Admitting: Student

## 2024-01-25 DIAGNOSIS — M47816 Spondylosis without myelopathy or radiculopathy, lumbar region: Secondary | ICD-10-CM

## 2024-01-29 ENCOUNTER — Telehealth: Payer: Self-pay

## 2024-01-29 MED ORDER — DIPHENHYDRAMINE HCL 50 MG PO TABS
50.0000 mg | ORAL_TABLET | ORAL | 0 refills | Status: AC
Start: 2024-01-29 — End: ?

## 2024-01-29 MED ORDER — PREDNISONE 50 MG PO TABS: ORAL_TABLET | ORAL | 0 refills | Status: DC

## 2024-01-29 NOTE — Telephone Encounter (Signed)
 Phone call to patient to review instructions for 13 hr prep for CT w/ contrast on 02/03/24 at 11:00AM. Prescription called into [facility]. Pt aware and verbalized understanding of instructions. Prescription: 02/02/24 @ 10:00PM- 50mg  Prednisone 02/03/24 @ 4:00AM- 50mg  Prednisone 02/03/24 @10 :00AM- 50mg  Prednisone and 50mg  Benadryl

## 2024-01-31 NOTE — Discharge Instructions (Signed)

## 2024-02-03 ENCOUNTER — Other Ambulatory Visit: Payer: Self-pay | Admitting: Student

## 2024-02-03 ENCOUNTER — Ambulatory Visit
Admission: RE | Admit: 2024-02-03 | Discharge: 2024-02-03 | Disposition: A | Discharge status: Home / Self Care | Source: Ambulatory Visit | Attending: Student | Admitting: Student

## 2024-02-03 DIAGNOSIS — M47816 Spondylosis without myelopathy or radiculopathy, lumbar region: Secondary | ICD-10-CM

## 2024-02-03 MED ORDER — IOPAMIDOL (ISOVUE-M 200) INJECTION 41%
1.0000 mL | Freq: Once | INTRAMUSCULAR | Status: AC
  Administered 2024-02-03: 1 mL via EPIDURAL

## 2024-02-03 MED ORDER — METHYLPREDNISOLONE ACETATE 40 MG/ML INJ SUSP (RADIOLOG
80.0000 mg | Freq: Once | INTRAMUSCULAR | Status: AC
Start: 2024-02-03 — End: 2024-02-03
  Administered 2024-02-03: 80 mg via EPIDURAL

## 2024-02-20 ENCOUNTER — Other Ambulatory Visit: Payer: Self-pay | Admitting: Neurosurgery

## 2024-02-20 DIAGNOSIS — M47816 Spondylosis without myelopathy or radiculopathy, lumbar region: Secondary | ICD-10-CM

## 2024-02-26 ENCOUNTER — Encounter (HOSPITAL_BASED_OUTPATIENT_CLINIC_OR_DEPARTMENT_OTHER): Payer: Self-pay | Admitting: *Deleted

## 2024-02-26 ENCOUNTER — Emergency Department (HOSPITAL_BASED_OUTPATIENT_CLINIC_OR_DEPARTMENT_OTHER)
Admission: EM | Admit: 2024-02-26 | Discharge: 2024-02-26 | Discharge status: Against Medical Advice | Attending: Emergency Medicine | Admitting: Emergency Medicine

## 2024-02-26 ENCOUNTER — Other Ambulatory Visit: Payer: Self-pay

## 2024-02-26 DIAGNOSIS — W1839XA Other fall on same level, initial encounter: Secondary | ICD-10-CM | POA: Diagnosis not present

## 2024-02-26 DIAGNOSIS — Z96653 Presence of artificial knee joint, bilateral: Secondary | ICD-10-CM | POA: Insufficient documentation

## 2024-02-26 DIAGNOSIS — M545 Low back pain, unspecified: Secondary | ICD-10-CM | POA: Insufficient documentation

## 2024-02-26 DIAGNOSIS — Z5321 Procedure and treatment not carried out due to patient leaving prior to being seen by health care provider: Secondary | ICD-10-CM | POA: Diagnosis not present

## 2024-02-26 NOTE — ED Triage Notes (Signed)
 Patient to a ED with lower back pain radiating down both legs. Pain started last week after a fall and had worsened since. Patient reporting she has been unable to ambulate well.   Hx of 2 back surgeries and bilateral knee replacements.

## 2024-02-26 NOTE — ED Notes (Signed)
 Called patient for room x3 with no answer. Unable to locate patient in lobby or surrounding areas. Patient LWBS after triage.

## 2024-03-20 ENCOUNTER — Ambulatory Visit: Admitting: Orthopaedic Surgery

## 2024-03-20 DIAGNOSIS — M25552 Pain in left hip: Secondary | ICD-10-CM | POA: Diagnosis not present

## 2024-03-20 MED ORDER — ACETAMINOPHEN-CODEINE 300-30 MG PO TABS: 1.0000 | ORAL_TABLET | Freq: Every day | ORAL | 0 refills | Status: DC | PRN

## 2024-03-21 DIAGNOSIS — M25552 Pain in left hip: Secondary | ICD-10-CM

## 2024-03-21 MED ORDER — METHYLPREDNISOLONE ACETATE 40 MG/ML IJ SUSP
40.0000 mg | INTRAMUSCULAR | Status: AC | PRN
Start: 2024-03-21 — End: 2024-03-21
  Administered 2024-03-21: 40 mg via INTRA_ARTICULAR

## 2024-03-21 MED ORDER — BUPIVACAINE HCL 0.5 % IJ SOLN
3.0000 mL | INTRAMUSCULAR | Status: AC | PRN
  Administered 2024-03-21: 3 mL via INTRA_ARTICULAR

## 2024-03-21 MED ORDER — LIDOCAINE HCL 1 % IJ SOLN
3.0000 mL | INTRAMUSCULAR | Status: AC | PRN
  Administered 2024-03-21: 3 mL

## 2024-03-21 NOTE — Progress Notes (Signed)
 Office Visit Note   Patient: Wendy Carrillo           Date of Birth: [birth year]           MRN: [id number] Visit Date: 03/20/2024              Requested by: Edda Goo, MD [address] [place],  [place] [address] PCP: Edda Goo, MD   Assessment & Plan: Visit Diagnoses:  1. Pain in left hip     Plan: History of Present Illness Wendy Carrillo is an 83 year old female who presents with severe left hip and back pain.  She experiences severe pain in her left hip and back, radiating down the thigh and into the groin, with a sensation of 'water' running down her leg. The pain significantly affects her ability to stand and sleep, requiring Seroquel for sleep.  She received a back injection in March with some relief and took prednisone  one to two months ago for back pain. She is scheduled for another injection in June. The current severe pain developed in the last couple of weeks.  The pain extends from the hip to the knee and sometimes beyond. Her artificial knees contribute to her discomfort. There is no pain with hip movement or rotation, but tenderness is present with pressure.  Physical Exam MUSCULOSKELETAL: Tenderness over left hip bursa. Painless ROM of the hip.  Negative sciatic tension signs.  Results RADIOLOGY Hip X-ray: No abnormalities detected.  Assessment and Plan Left trochanteric bursitis Confirmed tenderness over left trochanteric bursa indicating inflammation. - Administer bursa injection to reduce inflammation and alleviate pain.  Follow-Up Instructions: No follow-ups on file.   Orders:  No orders of the defined types were placed in this encounter.  Meds ordered this encounter  Medications   acetaminophen -codeine  (TYLENOL  #3) 300-30 MG tablet    Sig: Take 1-2 tablets by mouth daily as needed for up to 7 days for moderate pain (pain score 4-6).    Dispense:  14 tablet    Refill:  0      Procedures: Large Joint Inj: L greater trochanter on  03/21/2024 9:51 AM Indications: pain Details: 22 G needle Medications: 3 mL lidocaine  1 %; 3 mL bupivacaine  0.5 %; 40 mg methylPREDNISolone  acetate 40 MG/ML Outcome: tolerated well, no immediate complications Patient was prepped and draped in the usual sterile fashion.       Clinical Data: No additional findings.   Subjective: Chief Complaint  Patient presents with   Left Hip - Pain   Lower Back - Pain    HPI  Review of Systems  Constitutional: Negative.   HENT: Negative.    Eyes: Negative.   Respiratory: Negative.    Cardiovascular: Negative.   Endocrine: Negative.   Musculoskeletal: Negative.   Neurological: Negative.   Hematological: Negative.   Psychiatric/Behavioral: Negative.    All other systems reviewed and are negative.    Objective: Vital Signs: There were no vitals taken for this visit.  Physical Exam Vitals and nursing note reviewed.  Constitutional:      Appearance: She is well-developed.  HENT:     Head: Atraumatic.     Nose: Nose normal.  Eyes:     Extraocular Movements: Extraocular movements intact.  Cardiovascular:     Pulses: Normal pulses.  Pulmonary:     Effort: Pulmonary effort is normal.  Abdominal:     Palpations: Abdomen is soft.  Musculoskeletal:     Cervical back: Neck supple.  Skin:  General: Skin is warm.     Capillary Refill: Capillary refill takes less than 2 seconds.  Neurological:     Mental Status: She is alert. Mental status is at baseline.  Psychiatric:        Behavior: Behavior normal.        Thought Content: Thought content normal.        Judgment: Judgment normal.     Ortho Exam  Specialty Comments:  No specialty comments available.  Imaging: No results found.   PMFS History: Patient Active Problem List   Diagnosis Date Noted   Pyelonephritis 10/21/2023   Pain in left hip 09/03/2023   Pleuritic chest pain 10/11/2022   Pyuria 10/11/2022   Hypotension 10/11/2022   Spinal stenosis at L4-L5  level 05/18/2019   Benign essential HTN    History of total bilateral knee replacement (TKR)    Acute blood loss anemia    Post-operative pain    Spinal stenosis of lumbar region 06/09/2018   Spinal stenosis, lumbar    Hypertension    History of duodenal ulcer    Hepatitis    Gastritis    Family history of adverse reaction to anesthesia    Chronic lower back pain    Arthritis    Coronary artery calcification 10/21/2017   Combined forms of age-related cataract of right eye 04/18/2017   Status post total bilateral knee replacement 06/08/2016   Left lumbar radiculopathy 05/09/2016   Bilateral shoulder pain 03/22/2016   DDD (degenerative disc disease), cervical 03/22/2016   Morbid obesity with BMI of 40.0-44.9, adult (HCC) 03/08/2016   Spondylolisthesis, 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/2016   Neck pain 03/08/2016   Lumbar stenosis 03/08/2016   Cervical spinal stenosis 03/08/2016   Back pain 11/16/2015   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/10/2015   Angina pectoris (HCC) 04/20/2015   Morbid obesity (HCC) 04/20/2015   Dyspnea 04/20/2015   Atypical chest pain 04/20/2015   Esophageal reflux 02/23/2015   Essential hypertension 02/23/2015   Chronic back pain 02/23/2015   Chest pain 02/22/2015   Abdominal tenderness, LLQ (left lower quadrant) 06/15/2014   Acute sinusitis 06/15/2014   Allergic rhinitis due to pollen 06/15/2014   Bladder disorder 06/15/2014   Epistaxis 06/15/2014   Female stress incontinence 06/15/2014   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   Insomnia 06/15/2014   Localized primary osteoarthritis of wrist 06/15/2014   Lumbosacral spondylosis 06/15/2014   Osteoarthrosis, localized, primary, hand 06/15/2014   Referred otalgia 06/15/2014   Tingling 06/15/2014   Tinnitus 06/15/2014   Urinary incontinence 06/15/2014   Cystocele 05/04/2014   Retention of urine 05/04/2014   Cystocele, lateral 04/12/2014   Rectocele 03/17/2014   History of blood transfusion 11/05/1990   Past Medical  History:  Diagnosis Date   Arthritis    "all over"   Cervicalgia    Chronic low back pain    Chronic lower back pain    Coronary artery calcification    cardiology--- dr t. Renick---  nuclear study in epic 05-06-2015 no ischedmia, nuclear ef 51%;  cardiac cath 10-23-2017 mild coronary calcification in all 3V without significant obstructive disease   Enlarged lymph node    left goin   GERD (gastroesophageal reflux disease)    History of duodenal ulcer    History of gastritis    History of hepatitis    1960  per pt had finger stick by needle was  in nursing school , does not remember  what type   Hypertension    followed by pcp  Spinal stenosis, lumbar    Ulnar neuropathy of right upper extremity    Wears glasses    Wears partial dentures    upper    Family History  Problem Relation Age of Onset   Hypertension Mother    Stroke Mother    Diabetes Mother    Heart attack Father    Breast cancer Sister    Leukemia Brother    Breast cancer Sister    Breast cancer Sister    Heart disease Brother     Past Surgical History:  Procedure Laterality Date   ABDOMINAL HERNIA REPAIR  1980s   ANTERIOR AND POSTERIOR VAGINAL REPAIR  2015   W/  MID-URETHRAL SLING AND SACROSPINOUS FIXATION   BACK SURGERY     BREAST CYST EXCISION Bilateral    BREAST SURGERY Left    "nipple taken off"   CARPAL TUNNEL RELEASE Bilateral right 2010;  left 2012   CATARACT EXTRACTION W/ INTRAOCULAR LENS IMPLANT Left 2014   CATARACT EXTRACTION W/ INTRAOCULAR LENS IMPLANT Bilateral right 2018;  left ?   CHOLECYSTECTOMY OPEN  1980's   AND APPENDECTOMY   COLONOSCOPY W/ BIOPSIES AND POLYPECTOMY  last one 01/ 2017   DILATION AND CURETTAGE OF UTERUS  "several"   HERNIA REPAIR     LEFT HEART CATH AND CORONARY ANGIOGRAPHY N/A 10/23/2017   Procedure: LEFT HEART CATH AND CORONARY ANGIOGRAPHY;  Surgeon: Millicent Ally, MD;  Location: [facility];  Service: Cardiovascular;  Laterality: N/A;   LUMBAR  LAMINECTOMY/DECOMPRESSION MICRODISCECTOMY Bilateral 06/09/2018   Procedure: Laminectomy and Foraminotomy - Lumbar Two,Lumbar Three - Lumbar One- Two - Lumbar Three-Four- bilateral;  Surgeon: Gearl Keens, MD;  Location: [facility] OR;  Service: Neurosurgery;  Laterality: Bilateral;   LUMBAR LAMINECTOMY/DECOMPRESSION MICRODISCECTOMY Bilateral 06/01/2020   Procedure: Laminectomy and Foraminotomy - left - Lumbar Two-Lumbar Three -  - bilateral - Lumbar Four-Lumbar Five;  Surgeon: Gearl Keens, MD;  Location: [facility] OR;  Service: Neurosurgery;  Laterality: Bilateral;  Laminectomy and Foraminotomy - left - Lumbar Two-Lumbar Three -  - bilateral - Lumbar Four-Lumbar Five    LYMPH NODE BIOPSY Left 11/28/2020   Procedure: LEFT GROIN LYMPH NODE EXCISIONAL BIOPSY;  Surgeon: Dorrie Gaudier, Alphonso Aschoff, MD;  Location: [facility] ;  Service: General;  Laterality: Left;   MULTIPLE TOOTH EXTRACTIONS     PATELLA FRACTURE SURGERY Left 1990s   "after the replacement"   REDUCTION MAMMAPLASTY Bilateral 2004   TOTAL KNEE ARTHROPLASTY Bilateral left 1992;  right 2001   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

## 2024-04-01 ENCOUNTER — Other Ambulatory Visit: Payer: Self-pay | Admitting: Orthopaedic Surgery

## 2024-04-02 ENCOUNTER — Other Ambulatory Visit (INDEPENDENT_AMBULATORY_CARE_PROVIDER_SITE_OTHER): Payer: Self-pay

## 2024-04-02 ENCOUNTER — Ambulatory Visit: Admitting: Surgical

## 2024-04-02 DIAGNOSIS — M25562 Pain in left knee: Secondary | ICD-10-CM

## 2024-04-05 ENCOUNTER — Encounter: Payer: Self-pay | Admitting: Surgical

## 2024-04-05 MED ORDER — ACETAMINOPHEN-CODEINE 300-30 MG PO TABS: 1.0000 | ORAL_TABLET | Freq: Every day | ORAL | 0 refills | Status: AC | PRN

## 2024-04-05 NOTE — Progress Notes (Signed)
 Office Visit Note   Patient: Wendy Carrillo           Date of Birth: July 29, 1941           MRN: [id number] Visit Date: 04/02/2024 Requested by: Edda Goo, MD [address] [place],  [place] [address] PCP: Edda Goo, MD  Subjective: Chief Complaint  Patient presents with   Left Knee - Pain    HPI: Wendy Carrillo is a 83 y.o. female who presents to the office reporting left knee pain.  She has history of left knee replacement that was done several decades ago by Dr. Avanell Bob.  She was recently seen for left trochanteric pain with injection by Dr. Christiane Cowing that has given her good relief of her hip pain.  She is now here today because she feels like her left knee buckled on her recently and she wants to make sure everything is okay.  Has not had continual buckling but has had some increased pain since the event.  She is going to [place]  on 6/10 and wants to make sure she is okay to travel.  She has leg pain that radiates from the buttock region down to the mid calf with associated low back pain.  She has lumbar spine ESI being scheduled currently.  She denies any consistent fevers.  No change in the appearance of her knee incision.  Takes occasional Tylenol  No. 3 for various aches and pains and would like to refill this.  No groin pain..                ROS: All systems reviewed are negative as they relate to the chief complaint within the history of present illness.  Patient denies fevers or chills.  Assessment & Plan: Visit Diagnoses:  1. Acute pain of left knee     Plan: Impression is 83 year old female with left knee replacement that radiographically appears similar to it has in the past.  There is no evidence of severe injury from this buckling incident that she had where she did not fall.  No sign or symptom of infection.  We discussed using a brace to give her some extra support with her upcoming trip to [place]  and she will try this.  Also think that a lot of the symptoms that she is  having in her left leg such as the radiating pain from her buttock down to the mid calf, seems to be mostly related to her spinal stenosis.  She has upcoming lumbar spine ESI which hopefully should help the symptoms.  Plan to use the brace.  Tylenol  refilled.  Follow-up with the office as needed.  Follow-Up Instructions: No follow-ups on file.   Orders:  Orders Placed This Encounter  Procedures   XR KNEE 3 VIEW LEFT   No orders of the defined types were placed in this encounter.     Procedures: No procedures performed   Clinical Data: No additional findings.  Objective: Vital Signs: There were no vitals taken for this visit.  Physical Exam:  Constitutional: Patient appears well-developed HEENT:  Head: Normocephalic Eyes:EOM are normal Neck: Normal range of motion Cardiovascular: Normal rate Pulmonary/chest: Effort normal Neurologic: Patient is alert Skin: Skin is warm Psychiatric: Patient has normal mood and affect  Ortho Exam: Ortho exam demonstrates left knee with incision that is well-healed from prior left knee replacement.  No effusion noted.  Able to perform straight leg raise without extensor lag.  Intact hamstring strength and quad strength rated 5/5.  She has no pain with hip range of motion.  Stable to varus and valgus stress at 0 and 30 degrees.  She has no gross mid flexion instability noted.  Specialty Comments:  No specialty comments available.  Imaging: No results found.   PMFS History: Patient Active Problem List   Diagnosis Date Noted   Pyelonephritis 10/21/2023   Pain in left hip 09/03/2023   Pleuritic chest pain 10/11/2022   Pyuria 10/11/2022   Hypotension 10/11/2022   Spinal stenosis at L4-L5 level 05/18/2019   Benign essential HTN    History of total bilateral knee replacement (TKR)    Acute blood loss anemia    Post-operative pain    Spinal stenosis of lumbar region 06/09/2018   Spinal stenosis, lumbar    Hypertension    History of  duodenal ulcer    Hepatitis    Gastritis    Family history of adverse reaction to anesthesia    Chronic lower back pain    Arthritis    Coronary artery calcification 10/21/2017   Combined forms of age-related cataract of right eye 04/18/2017   Status post total bilateral knee replacement 06/08/2016   Left lumbar radiculopathy 05/09/2016   Bilateral shoulder pain 03/22/2016   DDD (degenerative disc disease), cervical 03/22/2016   Morbid obesity with BMI of 40.0-44.9, adult (HCC) 03/08/2016   Spondylolisthesis, 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/2016   Neck pain 03/08/2016   Lumbar stenosis 03/08/2016   Cervical spinal stenosis 03/08/2016   Back pain 11/16/2015   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/10/2015   Angina pectoris (HCC) 04/20/2015   Morbid obesity (HCC) 04/20/2015   Dyspnea 04/20/2015   Atypical chest pain 04/20/2015   Esophageal reflux 02/23/2015   Essential hypertension 02/23/2015   Chronic back pain 02/23/2015   Chest pain 02/22/2015   Abdominal tenderness, LLQ (left lower quadrant) 06/15/2014   Acute sinusitis 06/15/2014   Allergic rhinitis due to pollen 06/15/2014   Bladder disorder 06/15/2014   Epistaxis 06/15/2014   Female stress incontinence 06/15/2014   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   Insomnia 06/15/2014   Localized primary osteoarthritis of wrist 06/15/2014   Lumbosacral spondylosis 06/15/2014   Osteoarthrosis, localized, primary, hand 06/15/2014   Referred otalgia 06/15/2014   Tingling 06/15/2014   Tinnitus 06/15/2014   Urinary incontinence 06/15/2014   Cystocele 05/04/2014   Retention of urine 05/04/2014   Cystocele, lateral 04/12/2014   Rectocele 03/17/2014   History of blood transfusion 11/05/1990   Past Medical History:  Diagnosis Date   Arthritis    "all over"   Cervicalgia    Chronic low back pain    Chronic lower back pain    Coronary artery calcification    cardiology--- dr t. Theodis Fiscal---  nuclear study in epic 05-06-2015 no ischedmia, nuclear ef 51%;   cardiac cath 10-23-2017 mild coronary calcification in all 3V without significant obstructive disease   Enlarged lymph node    left goin   GERD (gastroesophageal reflux disease)    History of duodenal ulcer    History of gastritis    History of hepatitis    1960  per pt had finger stick by needle was  in nursing school , does not remember  what type   Hypertension    followed by pcp   Spinal stenosis, lumbar    Ulnar neuropathy of right upper extremity    Wears glasses    Wears partial dentures    upper    Family History  Problem Relation Age of Onset   Hypertension Mother  Stroke Mother    Diabetes Mother    Heart attack Father    Breast cancer Sister    Leukemia Brother    Breast cancer Sister    Breast cancer Sister    Heart disease Brother     Past Surgical History:  Procedure Laterality Date   ABDOMINAL HERNIA REPAIR  1980s   ANTERIOR AND POSTERIOR VAGINAL REPAIR  2015   W/  MID-URETHRAL SLING AND SACROSPINOUS FIXATION   BACK SURGERY     BREAST CYST EXCISION Bilateral    BREAST SURGERY Left    "nipple taken off"   CARPAL TUNNEL RELEASE Bilateral right 2010;  left 2012   CATARACT EXTRACTION W/ INTRAOCULAR LENS IMPLANT Left 2014   CATARACT EXTRACTION W/ INTRAOCULAR LENS IMPLANT Bilateral right 2018;  left ?   CHOLECYSTECTOMY OPEN  1980's   AND APPENDECTOMY   COLONOSCOPY W/ BIOPSIES AND POLYPECTOMY  last one 01/ 2017   DILATION AND CURETTAGE OF UTERUS  "several"   HERNIA REPAIR     LEFT HEART CATH AND CORONARY ANGIOGRAPHY N/A 10/23/2017   Procedure: LEFT HEART CATH AND CORONARY ANGIOGRAPHY;  Surgeon: Millicent Ally, MD;  Location: [facility];  Service: Cardiovascular;  Laterality: N/A;   LUMBAR LAMINECTOMY/DECOMPRESSION MICRODISCECTOMY Bilateral 06/09/2018   Procedure: Laminectomy and Foraminotomy - Lumbar Two,Lumbar Three - Lumbar One- Two - Lumbar Three-Four- bilateral;  Surgeon: Gearl Keens, MD;  Location: [facility] OR;  Service: Neurosurgery;  Laterality:  Bilateral;   LUMBAR LAMINECTOMY/DECOMPRESSION MICRODISCECTOMY Bilateral 06/01/2020   Procedure: Laminectomy and Foraminotomy - left - Lumbar Two-Lumbar Three -  - bilateral - Lumbar Four-Lumbar Five;  Surgeon: Gearl Keens, MD;  Location: [facility] OR;  Service: Neurosurgery;  Laterality: Bilateral;  Laminectomy and Foraminotomy - left - Lumbar Two-Lumbar Three -  - bilateral - Lumbar Four-Lumbar Five    LYMPH NODE BIOPSY Left 11/28/2020   Procedure: LEFT GROIN LYMPH NODE EXCISIONAL BIOPSY;  Surgeon: Dorrie Gaudier, Alphonso Aschoff, MD;  Location: [facility] ;  Service: General;  Laterality: Left;   MULTIPLE TOOTH EXTRACTIONS     PATELLA FRACTURE SURGERY Left 1990s   "after the replacement"   REDUCTION MAMMAPLASTY Bilateral 2004   TOTAL KNEE ARTHROPLASTY Bilateral left 1992;  right 2001   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

## 2024-04-16 ENCOUNTER — Telehealth: Payer: Self-pay

## 2024-04-16 MED ORDER — PREDNISONE 50 MG PO TABS: ORAL_TABLET | ORAL | 0 refills | Status: DC

## 2024-04-16 NOTE — Discharge Instructions (Signed)

## 2024-04-16 NOTE — Progress Notes (Signed)
 Phone call to patient to review instructions for 13 hr prep for CT w/ contrast on 04/20/24  at 14:00. Prescription called into [facility]. Pt aware and verbalized understanding of instructions. Prescription: 04/19/24 9:00PM- 50mg  Prednisone  04/20/24 3:00AM- 50mg  Prednisone  04/20/24 9:00AM - 50mg  Prednisone  and 50mg  Benadryl  (pt has Benadryl  at home).

## 2024-04-16 NOTE — Progress Notes (Signed)
 13 Hour prep.

## 2024-04-20 ENCOUNTER — Other Ambulatory Visit: Payer: Self-pay | Admitting: Neurosurgery

## 2024-04-20 ENCOUNTER — Ambulatory Visit
Admission: RE | Admit: 2024-04-20 | Discharge: 2024-04-20 | Disposition: A | Discharge status: Home / Self Care | Source: Ambulatory Visit | Attending: Neurosurgery | Admitting: Neurosurgery

## 2024-04-20 DIAGNOSIS — M47816 Spondylosis without myelopathy or radiculopathy, lumbar region: Secondary | ICD-10-CM

## 2024-04-20 MED ORDER — METHYLPREDNISOLONE ACETATE 40 MG/ML INJ SUSP (RADIOLOG
120.0000 mg | Freq: Once | INTRAMUSCULAR | Status: AC
  Administered 2024-04-20: 120 mg via EPIDURAL

## 2024-04-20 MED ORDER — IOPAMIDOL (ISOVUE-M 200) INJECTION 41%
1.0000 mL | Freq: Once | INTRAMUSCULAR | Status: AC
Start: 2024-04-20 — End: 2024-04-20
  Administered 2024-04-20: 1 mL via EPIDURAL

## 2024-07-01 ENCOUNTER — Emergency Department (HOSPITAL_BASED_OUTPATIENT_CLINIC_OR_DEPARTMENT_OTHER)

## 2024-07-01 ENCOUNTER — Other Ambulatory Visit: Payer: Self-pay

## 2024-07-01 ENCOUNTER — Emergency Department (HOSPITAL_BASED_OUTPATIENT_CLINIC_OR_DEPARTMENT_OTHER)
Admission: EM | Admit: 2024-07-01 | Discharge: 2024-07-01 | Disposition: A | Discharge status: Home / Self Care | Source: Ambulatory Visit | Attending: Emergency Medicine | Admitting: Emergency Medicine

## 2024-07-01 ENCOUNTER — Encounter (HOSPITAL_BASED_OUTPATIENT_CLINIC_OR_DEPARTMENT_OTHER): Payer: Self-pay

## 2024-07-01 DIAGNOSIS — R0789 Other chest pain: Secondary | ICD-10-CM | POA: Insufficient documentation

## 2024-07-01 DIAGNOSIS — Z91040 Latex allergy status: Secondary | ICD-10-CM | POA: Diagnosis not present

## 2024-07-01 DIAGNOSIS — R051 Acute cough: Secondary | ICD-10-CM | POA: Diagnosis not present

## 2024-07-01 DIAGNOSIS — R059 Cough, unspecified: Secondary | ICD-10-CM | POA: Diagnosis present

## 2024-07-01 LAB — BASIC METABOLIC PANEL WITH GFR
Anion gap: 10 (ref 5–15)
BUN: 11 mg/dL (ref 8–23)
CO2: 26 mmol/L (ref 22–32)
Calcium: 8.8 mg/dL — ABNORMAL LOW (ref 8.9–10.3)
Chloride: 106 mmol/L (ref 98–111)
Creatinine, Ser: 0.69 mg/dL (ref 0.44–1.00)
GFR, Estimated: >60 mL/min (ref >60)
Glucose, Bld: 95 mg/dL (ref 70–99)
Potassium: 3.9 mmol/L (ref 3.5–5.1)
Sodium: 142 mmol/L (ref 135–145)

## 2024-07-01 LAB — URINALYSIS, ROUTINE W REFLEX MICROSCOPIC
Bilirubin Urine: NEGATIVE
Glucose, UA: NEGATIVE mg/dL
Hgb urine dipstick: NEGATIVE
Ketones, ur: NEGATIVE mg/dL
Nitrite: NEGATIVE
Protein, ur: NEGATIVE mg/dL
Specific Gravity, Urine: 1.02 (ref 1.005–1.030)
pH: 5.5 (ref 5.0–8.0)

## 2024-07-01 LAB — CBC
HCT: 39.5 % (ref 36.0–46.0)
Hemoglobin: 12.7 g/dL (ref 12.0–15.0)
MCH: 29.7 pg (ref 26.0–34.0)
MCHC: 32.2 g/dL (ref 30.0–36.0)
MCV: 92.3 fL (ref 80.0–100.0)
Platelets: 216 K/uL (ref 150–400)
RBC: 4.28 MIL/uL (ref 3.87–5.11)
RDW: 13 % (ref 11.5–15.5)
WBC: 5 K/uL (ref 4.0–10.5)
nRBC: 0 % (ref 0.0–0.2)

## 2024-07-01 LAB — RESP PANEL BY RT-PCR (RSV, FLU A&B, COVID)  RVPGX2
Influenza A by PCR: NEGATIVE
Influenza B by PCR: NEGATIVE
Resp Syncytial Virus by PCR: NEGATIVE
SARS Coronavirus 2 by RT PCR: NEGATIVE

## 2024-07-01 LAB — URINALYSIS, MICROSCOPIC (REFLEX)

## 2024-07-01 LAB — TROPONIN T, HIGH SENSITIVITY: Troponin T High Sensitivity: <15 ng/L (ref 0–19)

## 2024-07-01 MED ORDER — GUAIFENESIN 100 MG/5ML PO LIQD: 100.0000 mg | ORAL | 0 refills | Status: AC | PRN

## 2024-07-01 MED ORDER — ALBUTEROL SULFATE HFA 108 (90 BASE) MCG/ACT IN AERS
2.0000 | INHALATION_SPRAY | Freq: Once | RESPIRATORY_TRACT | Status: AC
  Administered 2024-07-01: 2 via RESPIRATORY_TRACT
  Filled 2024-07-01: qty 6.7

## 2024-07-01 NOTE — ED Provider Notes (Signed)
 Wendy Carrillo [facility] Provider Note   CSN: [id number] Arrival date & time: 07/01/24  1057     Patient presents with: Chest Pain   Wendy Carrillo is a 83 y.o. female.   HPI 83 year old female presents with a chief complaint of cough.  She has been coughing since 8/16.  Has been to her doctor's office and was started on antibiotics and given an IM injection about 5 days ago.  She is still on the antibiotics.  She feels like her cough is not getting better and maybe a little bit worse.  It is productive.  She is also having chest pain and shortness of breath for the past 3 days or so though she thinks that is from coughing itself.  No fevers.  Her ribs have been hurting when she coughs only.  She also has been having a UTI and feels like her symptoms are better but not gone and wants to get checked again. She is not sure which antibiotic she is on. Has not taken any cough medicine.  Prior to Admission medications   Medication Sig Start Date End Date Taking? Authorizing Provider  azithromycin (ZITHROMAX) 250 MG tablet Take 250-500 mg by mouth as directed. Take 2 tablets on day 1 and then1 tablet daily on days 2 through 5. 06/26/24  Yes [provider]  benzonatate (TESSALON) 200 MG capsule Take 200 mg by mouth 3 (three) times daily as needed. 06/23/24  Yes [provider]  celecoxib  (CELEBREX ) 200 MG capsule Take 200 mg by mouth daily. 04/01/24  Yes [provider]  cyanocobalamin  (VITAMIN B12) 1000 MCG tablet Take 1 tablet (1,000 mcg total) by mouth daily. 10/15/22  Yes Bryn Bernardino NOVAK, MD  desloratadine (CLARINEX) 5 MG tablet Take 5 mg by mouth daily. 06/23/24  Yes [provider]  esomeprazole (NEXIUM) 40 MG capsule Take 40 mg by mouth as needed (heartburn). 09/13/22  Yes [provider]  fluticasone  (FLONASE ) 50 MCG/ACT nasal spray Place 1 spray into both nostrils daily as needed for allergies or rhinitis. 10/15/22   Yes Bryn Bernardino NOVAK, MD  furosemide  (LASIX ) 40 MG tablet Take 40 mg by mouth daily as needed for fluid. 05/05/24  Yes [provider]  gabapentin  (NEURONTIN ) 300 MG capsule TAKE 2 CAPSULES BY MOUTH THREE TIMES PER DAY. CAN SLOWLY INCREASE FROM CURRENT PRESCRIPTION. Patient taking differently: Take 300-600 mg by mouth daily as needed (for pain). 10/05/22  Yes Williams, Megan E, NP  guaiFENesin  (ROBITUSSIN) 100 MG/5ML liquid Take 5-10 mLs (100-200 mg total) by mouth every 4 (four) hours as needed for cough or to loosen phlegm. 07/01/24  Yes Freddi Hamilton, MD  hydrOXYzine  (ATARAX ) 10 MG tablet One tab every 12 hours as needed for itching Patient taking differently: Take 10 mg by mouth as needed for itching. 03/25/22  Yes Jarold Medici, MD  Multiple Vitamins-Minerals (AIRBORNE GUMMIES PO) Take 2 tablets by mouth daily.   Yes [provider]  traMADol  (ULTRAM ) 50 MG tablet Take 100 mg by mouth every 12 (twelve) hours as needed for moderate pain (pain score 4-6). 04/24/24  Yes [provider]  Vitamin D , Ergocalciferol , (DRISDOL ) 50000 UNITS CAPS capsule Take 50,000 Units by mouth every Thursday.   Yes [provider]  ALPRAZolam  (XANAX ) 0.25 MG tablet Take 1 tablet (0.25 mg total) by mouth daily as needed for anxiety. Patient not taking: Reported on 07/01/2024 10/15/22   Bryn Bernardino NOVAK, MD  cefpodoxime  (VANTIN ) 200 MG tablet  Take 1 tablet (200 mg total) by mouth 2 (two) times daily. Patient not taking: Reported on 07/01/2024 10/18/23   Griselda Norris, MD  diphenhydrAMINE  (BENADRYL ) 50 MG tablet Take 1 tablet (50 mg total) by mouth as directed. Take 1 tablet 02/03/24 at 10:00AM with last dose of Prednisone . Patient not taking: Reported on 07/01/2024 01/29/24   Cristal Anes, MD  methocarbamol  (ROBAXIN ) 500 MG tablet Take 1 tablet (500 mg total) by mouth every 8 (eight) hours as needed for muscle spasms. Patient not taking: Reported on 07/01/2024 10/22/23   Drusilla Sabas RAMAN, MD   nitrofurantoin  (MACRODANTIN ) 100 MG capsule Take 100 mg by mouth every 6 (six) hours. Patient not taking: Reported on 07/01/2024 06/11/24   [provider]  ondansetron  (ZOFRAN -ODT) 4 MG disintegrating tablet Take 1 tablet (4 mg total) by mouth every 8 (eight) hours as needed for nausea or vomiting. Patient not taking: Reported on 07/01/2024 10/18/23   Griselda Norris, MD  predniSONE  (DELTASONE ) 50 MG tablet Pt to take 50 mg of prednisone  on 04/19/24 at 9:00PM, 50 mg of prednisone  on 04/20/24 at 3:00AM, and 50 mg of prednisone  on 04/20/24 at 9:00AM. Pt is also to take 50 mg of benadryl  on 04/20/24 at 9:00AM. Please call [phone] with any questions. Patient not taking: Reported on 07/01/2024 04/16/24   Karalee Wilkie POUR, MD    Allergies: Contrast media [iodinated contrast media], Penicillins, Cyclobenzaprine , Lactose intolerance (gi), Tilactase, Baclofen, Latex, Other, and Tape    Review of Systems  Constitutional:  Negative for fever.  Respiratory:  Positive for cough and shortness of breath.   Cardiovascular:  Positive for chest pain.  Gastrointestinal:  Negative for abdominal pain.  Genitourinary:  Positive for dysuria.    Updated Vital Signs BP 133/82 (BP Location: Left Arm)   Pulse 68   Temp 98.3 F (36.8 C) (Oral)   Resp 18   Wt 90.3 kg   SpO2 98%   BMI 34.16 kg/m   Physical Exam Vitals and nursing note reviewed.  Constitutional:      General: She is not in acute distress.    Appearance: She is well-developed. She is not ill-appearing or diaphoretic.  HENT:     Head: Normocephalic and atraumatic.  Cardiovascular:     Rate and Rhythm: Normal rate and regular rhythm.     Heart sounds: Normal heart sounds.  Pulmonary:     Effort: Pulmonary effort is normal.     Comments: Coughing with deep inspiration. Perhaps some mild wheezing at the bases on expiration. Chest:     Chest wall: Tenderness (to bilateral inferior ribs/chest wall) present.  Abdominal:      Palpations: Abdomen is soft.     Tenderness: There is no abdominal tenderness.  Skin:    General: Skin is warm and dry.  Neurological:     Mental Status: She is alert.     (all labs ordered are listed, but only abnormal results are displayed) Labs Reviewed  BASIC METABOLIC PANEL WITH GFR - Abnormal; Notable for the following components:      Result Value   Calcium 8.8 (*)    All other components within normal limits  URINALYSIS, ROUTINE W REFLEX MICROSCOPIC - Abnormal; Notable for the following components:   APPearance HAZY (*)    Leukocytes,Ua MODERATE (*)    All other components within normal limits  URINALYSIS, MICROSCOPIC (REFLEX) - Abnormal; Notable for the following components:   Bacteria, UA FEW (*)    All other components within normal limits  RESP PANEL BY RT-PCR (RSV, FLU A&B, COVID)  RVPGX2  CBC  TROPONIN T, HIGH SENSITIVITY    EKG: EKG Interpretation Date/Time:  Wednesday July 01 2024 11:11:09 EDT Ventricular Rate:  67 PR Interval:  159 QRS Duration:  104 QT Interval:  415 QTC Calculation: 439 R Axis:   -42  Text Interpretation: Sinus rhythm Abnormal R-wave progression, late transition Left ventricular hypertrophy Nonspecific T abnormalities, diffuse leads similar to Mar 2025 Confirmed by Freddi Hamilton [phone]) on 07/01/2024 11:45:22 AM  Radiology: DG Chest 2 View Result Date: 07/01/2024 CLINICAL DATA:  Chest pain, short of breath EXAM: CHEST - 2 VIEW COMPARISON:  None Available. FINDINGS: The heart size and mediastinal contours are within normal limits. Both lungs are clear. No acute fracture or osseous lesion. Bilateral joint osteoarthritis. Multiple loose bodies are present within the left axillary pouch. Surgical clips in the right quadrant consistent with prior cholecystectomy. IMPRESSION: No active cardiopulmonary disease. Bilateral shoulder joint osteoarthritis. Multiple calcified loose bodies present within left glenohumeral joint space. Electronically  Signed   By: Wilkie Lent M.D.   On: 07/01/2024 12:16     Procedures   Medications Ordered in the ED  albuterol  (VENTOLIN  HFA) 108 (90 Base) MCG/ACT inhaler 2 puff (2 puffs Inhalation Given 07/01/24 1247)                                    Medical Decision Making Amount and/or Complexity of Data Reviewed External Data Reviewed: notes. Labs: ordered.    Details: Normal troponin Radiology: ordered and independent interpretation performed.    Details: No pneumonia ECG/medicine tests: ordered and independent interpretation performed.    Details: No acute changes  Risk OTC drugs. Prescription drug management.   Unclear what antibiotic patient is on but she is on 1 currently.  Unclear if this is for UTI and/or respiratory illness.  However she states this for her cough.  Her urine is relatively benign and appears to be contaminated.  Given she is already on antibiotics I do not think she needs a change in antibiotics.  She does actually feel better from a cough standpoint with some albuterol .  At this point, likely has some sort of viral process which I think changing antibiotics would not be warranted.  I doubt occult pneumonia, PE, ACS.  Chest pain has been going on for a couple days and seems more cough related and her ECG is stable compared to baseline and her troponin is normal.  Ultimately, will give her some cough medicine, let her use the inhaler, and follow-up with PCP.  Given return precautions.     Final diagnoses:  Acute cough    ED Discharge Orders          Ordered    guaiFENesin  (ROBITUSSIN) 100 MG/5ML liquid  Every 4 hours PRN        07/01/24 1418               Freddi Hamilton, MD 07/01/24 1527

## 2024-07-01 NOTE — ED Triage Notes (Signed)
 Chest pain, SHOB, Congestion, cough, fatigue, muscle aches since 8/16. Tested for covid, flu RSV by PCP on 8/22, negative.   Reports dysuria for 2 weeks

## 2024-07-01 NOTE — Discharge Instructions (Addendum)
 You may use the albuterol  inhaler 1-2 puffs every 4 hours as needed for cough or shortness of breath or wheezing.  We are also prescribing you cough medicine though your primary care provider also prescribed you Tessalon which you may use 3 times a day as needed for cough.  If you start coughing up blood, have shortness of breath, chest pain, or any other new/concerning symptoms then return to the ER.

## 2024-07-13 ENCOUNTER — Encounter (HOSPITAL_BASED_OUTPATIENT_CLINIC_OR_DEPARTMENT_OTHER): Payer: Self-pay | Admitting: Student

## 2024-07-13 ENCOUNTER — Ambulatory Visit (INDEPENDENT_AMBULATORY_CARE_PROVIDER_SITE_OTHER): Admitting: Student

## 2024-07-13 ENCOUNTER — Other Ambulatory Visit (HOSPITAL_BASED_OUTPATIENT_CLINIC_OR_DEPARTMENT_OTHER): Payer: Self-pay

## 2024-07-13 DIAGNOSIS — M25552 Pain in left hip: Secondary | ICD-10-CM

## 2024-07-13 DIAGNOSIS — M79605 Pain in left leg: Secondary | ICD-10-CM | POA: Diagnosis not present

## 2024-07-13 MED ORDER — ACETAMINOPHEN-CODEINE 300-30 MG PO TABS
1.0000 | ORAL_TABLET | Freq: Every day | ORAL | 0 refills | Status: DC
  Filled 2024-07-13: qty 15, 7d supply, fill #0

## 2024-07-13 NOTE — Progress Notes (Signed)
 Chief Complaint: Left hip and leg pain    Discussed the use of AI scribe software for clinical note transcription with the patient, who gave verbal consent to proceed.  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 She is accompanied by her husband. She experiences severe pain in her right thigh, radiating to her foot and ankle, which began last week.  Does not report any injury.  The pain is accompanied by significant swelling, making it difficult for her to stand or sleep. There is soreness in the thigh and knee, but no calf pain.  She has a history of a blood clot in the same leg two years ago, treated with blood thinners, which she is no longer taking. Her current medications include tramadol , which is no longer effective for her pain. She previously found some relief with Tylenol  3. She uses a cane for stability and has resorted to using a wheelchair due to the current pain and swelling. 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   Surgical History:   Left TKA 1992  PMH/PSH/Family History/Social History/Meds/Allergies:    Past Medical History:  Diagnosis Date   Arthritis    all over   Cervicalgia    Chronic low back pain    Chronic lower back pain    Coronary artery calcification    cardiology--- dr t. Dukes---  nuclear study in epic 05-06-2015 no ischedmia, nuclear ef 51%;  cardiac cath 10-23-2017 mild coronary calcification in all 3V without significant obstructive disease   Enlarged lymph node    left goin   GERD (gastroesophageal reflux disease)    History of duodenal ulcer    History of gastritis    History of hepatitis    1960  per pt had finger stick by needle was  in nursing school , does not remember  what type   Hypertension    followed by pcp   Spinal stenosis,  lumbar    Ulnar neuropathy of right upper extremity    Wears glasses    Wears partial dentures    upper   Past Surgical History:  Procedure Laterality Date   ABDOMINAL HERNIA REPAIR  1980s   ANTERIOR AND POSTERIOR VAGINAL REPAIR  2015   W/  MID-URETHRAL SLING AND SACROSPINOUS FIXATION   BACK SURGERY     BREAST CYST EXCISION Bilateral    BREAST SURGERY Left    nipple taken off   CARPAL TUNNEL RELEASE Bilateral right 2010;  left 2012   CATARACT EXTRACTION W/ INTRAOCULAR LENS IMPLANT Left 2014   CATARACT EXTRACTION W/ INTRAOCULAR LENS IMPLANT Bilateral right 2018;  left ?   CHOLECYSTECTOMY OPEN  1980's   AND APPENDECTOMY   COLONOSCOPY W/ BIOPSIES AND POLYPECTOMY  last one 01/ 2017   DILATION AND CURETTAGE OF UTERUS  several   HERNIA REPAIR     LEFT HEART CATH AND CORONARY ANGIOGRAPHY N/A 10/23/2017   Procedure: LEFT HEART CATH AND CORONARY ANGIOGRAPHY;  Surgeon: Burnard Debby LABOR, MD;  Location: [facility];  Service: Cardiovascular;  Laterality: N/A;   LUMBAR LAMINECTOMY/DECOMPRESSION MICRODISCECTOMY Bilateral 06/09/2018   Procedure: Laminectomy and Foraminotomy - Lumbar Two,Lumbar Three -  Lumbar One- Two - Lumbar Three-Four- bilateral;  Surgeon: Onetha Kuba, MD;  Location: [facility] OR;  Service: Neurosurgery;  Laterality: Bilateral;   LUMBAR LAMINECTOMY/DECOMPRESSION MICRODISCECTOMY Bilateral 06/01/2020   Procedure: Laminectomy and Foraminotomy - left - Lumbar Two-Lumbar Three -  - bilateral - Lumbar Four-Lumbar Five;  Surgeon: Onetha Kuba, MD;  Location[facility];  Service: Neurosurgery;  Laterality: Bilateral;  Laminectomy and Foraminotomy - left - Lumbar Two-Lumbar Three -  - bilateral - Lumbar Four-Lumbar Five    LYMPH NODE BIOPSY Left 11/28/2020   Procedure: LEFT GROIN LYMPH NODE EXCISIONAL BIOPSY;  Surgeon: Kinsinger, Herlene Righter, MD;  Location: 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-GYRUS)  2014   VAGINAL HYSTERECTOMY  1972   Social History   Socioeconomic History   Marital status: Married    Spouse name: Not on file   Number of children: Not on file   Years of education: Not on file   Highest education level: Not on file  Occupational History   Not on file  Tobacco Use   Smoking status: Never   Smokeless tobacco: Never  Vaping Use   Vaping status: Never Used  Substance and Sexual Activity   Alcohol use: No   Drug use: Never   Sexual activity: Not on file  Other Topics Concern   Not on file  Social History Narrative   Not on file   Social Drivers of Health   Financial Resource Strain: Not on file  Food Insecurity: No Food Insecurity (10/21/2023)   Hunger Vital Sign    Worried About Running Out of Food in the Last Year: Never true    Ran Out of Food in the Last Year: Never true  Transportation Needs: No Transportation Needs (10/21/2023)   PRAPARE - Administrator, Civil Service (Medical): No    Lack of Transportation (Non-Medical): No  Physical Activity: Not on file  Stress: Not on file  Social Connections: Not on file   Family History  Problem Relation Age of Onset   Hypertension Mother    Stroke Mother    Diabetes Mother    Heart attack Father    Breast cancer Sister    Leukemia Brother    Breast cancer Sister    Breast cancer Sister    Heart disease Brother    Allergies  Allergen Reactions   Contrast Media [Iodinated Contrast Media] Swelling, Rash and Other (See Comments)    Face, tongue swelling Needs 13-hour prep   Penicillins Swelling and Rash    Pt tolerates rocephin .  Given IV 01/18/2023   Cyclobenzaprine     Lactose Intolerance (Gi) Diarrhea   Tilactase Diarrhea   Baclofen Itching   Latex Rash   Other Dermatitis and Other (See  Comments)    Bandages caused redness    Tape Dermatitis and Other (See Comments)    Bandages caused redness   Current Outpatient Medications  Medication Sig Dispense Refill   acetaminophen -codeine  (TYLENOL  #3) 300-30 MG tablet Take 1-2 tablets by mouth daily. 15 tablet 0   ALPRAZolam  (XANAX ) 0.25 MG tablet Take 1 tablet (0.25 mg total) by mouth daily as needed for anxiety. (Patient not taking: Reported on 07/01/2024) 3 tablet 0  azithromycin (ZITHROMAX) 250 MG tablet Take 250-500 mg by mouth as directed. Take 2 tablets on day 1 and then1 tablet daily on days 2 through 5.     benzonatate (TESSALON) 200 MG capsule Take 200 mg by mouth 3 (three) times daily as needed.     cefpodoxime  (VANTIN ) 200 MG tablet Take 1 tablet (200 mg total) by mouth 2 (two) times daily. (Patient not taking: Reported on 07/01/2024) 14 tablet 0   celecoxib  (CELEBREX ) 200 MG capsule Take 200 mg by mouth daily.     cyanocobalamin  (VITAMIN B12) 1000 MCG tablet Take 1 tablet (1,000 mcg total) by mouth daily.     desloratadine (CLARINEX) 5 MG tablet Take 5 mg by mouth daily.     diphenhydrAMINE  (BENADRYL ) 50 MG tablet Take 1 tablet (50 mg total) by mouth as directed. Take 1 tablet 02/03/24 at 10:00AM with last dose of Prednisone . (Patient not taking: Reported on 07/01/2024) 1 tablet 0   esomeprazole (NEXIUM) 40 MG capsule Take 40 mg by mouth as needed (heartburn).     fluticasone  (FLONASE ) 50 MCG/ACT nasal spray Place 1 spray into both nostrils daily as needed for allergies or rhinitis.     furosemide  (LASIX ) 40 MG tablet Take 40 mg by mouth daily as needed for fluid.     gabapentin  (NEURONTIN ) 300 MG capsule TAKE 2 CAPSULES BY MOUTH THREE TIMES PER DAY. CAN SLOWLY INCREASE FROM CURRENT PRESCRIPTION. (Patient taking differently: Take 300-600 mg by mouth daily as needed (for pain).) 180 capsule 1   guaiFENesin  (ROBITUSSIN) 100 MG/5ML liquid Take 5-10 mLs (100-200 mg total) by mouth every 4 (four) hours as needed for cough or to  loosen phlegm. 60 mL 0   hydrOXYzine  (ATARAX ) 10 MG tablet One tab every 12 hours as needed for itching (Patient taking differently: Take 10 mg by mouth as needed for itching.) 30 tablet 0   methocarbamol  (ROBAXIN ) 500 MG tablet Take 1 tablet (500 mg total) by mouth every 8 (eight) hours as needed for muscle spasms. (Patient not taking: Reported on 07/01/2024) 30 tablet 0   Multiple Vitamins-Minerals (AIRBORNE GUMMIES PO) Take 2 tablets by mouth daily.     nitrofurantoin  (MACRODANTIN ) 100 MG capsule Take 100 mg by mouth every 6 (six) hours. (Patient not taking: Reported on 07/01/2024)     ondansetron  (ZOFRAN -ODT) 4 MG disintegrating tablet Take 1 tablet (4 mg total) by mouth every 8 (eight) hours as needed for nausea or vomiting. (Patient not taking: Reported on 07/01/2024) 8 tablet 0   predniSONE  (DELTASONE ) 50 MG tablet Pt to take 50 mg of prednisone  on 04/19/24 at 9:00PM, 50 mg of prednisone  on 04/20/24 at 3:00AM, and 50 mg of prednisone  on 04/20/24 at 9:00AM. Pt is also to take 50 mg of benadryl  on 04/20/24 at 9:00AM. Please call [phone] with any questions. (Patient not taking: Reported on 07/01/2024) 3 tablet 0   traMADol  (ULTRAM ) 50 MG tablet Take 100 mg by mouth every 12 (twelve) hours as needed for moderate pain (pain score 4-6).     Vitamin D , Ergocalciferol , (DRISDOL ) 50000 UNITS CAPS capsule Take 50,000 Units by mouth every Thursday.  5   No current facility-administered medications for this visit.   Facility-Administered Medications Ordered in Other Visits  Medication Dose Route Frequency Provider Last Rate Last Admin   diphenhydrAMINE  (BENADRYL ) capsule 50 mg  50 mg Oral Once Grady, Allen, MD       No results found.  Review of Systems:   A ROS was performed including pertinent  positives and negatives as documented in the HPI.  Physical Exam :   Constitutional: NAD and appears stated age Neurological: Alert and oriented Psych: Appropriate affect and cooperative There were no vitals  taken for this visit.   Comprehensive Musculoskeletal Exam:    Tenderness palpation along the left posterior hip musculature extending over the greater trochanter, and down the anterior thigh.  Upper thigh does appear mildly swollen with diffuse tenderness.  Calf is supple and nontender.  Bilateral knee flexion/extension ankle dorsiflexion/plantarflexion strength is 5/5.  Passive hip range of motion to 110 degrees flexion and 20 degrees ER/IR.  Imaging:        Assessment & Plan Unilateral lower extremity pain and swelling, rule out deep vein thrombosis (DVT)   She experiences severe right lower extremity pain and swelling, with a history of DVT.  No longer on anticoagulation.  The differential includes DVT and lumbar radiculopathy. Order an ultrasound of the right lower extremity to rule out DVT, to be completed today or tomorrow. Prescribe Tylenol  3 for pain management.  Low back pain with possible lumbar radiculopathy   Chronic low back pain with possible lumbar radiculopathy affects her lower extremity pain and mobility.  Pain distribution suggests lumbar nerve involvement. Patient has undergone multiple rounds of ESI.  Evaluate ultrasound results to rule out DVT before further management of lumbar radiculopathy.      I personally saw and evaluated the patient, and participated in the management and treatment plan.  Leonce Reveal, PA-C Orthopedics

## 2024-07-15 ENCOUNTER — Ambulatory Visit (HOSPITAL_COMMUNITY)
Admission: RE | Admit: 2024-07-15 | Discharge: 2024-07-15 | Disposition: A | Discharge status: Home / Self Care | Source: Ambulatory Visit | Attending: Student | Admitting: Student

## 2024-07-15 DIAGNOSIS — M79605 Pain in left leg: Secondary | ICD-10-CM | POA: Diagnosis present

## 2024-07-17 ENCOUNTER — Other Ambulatory Visit (HOSPITAL_BASED_OUTPATIENT_CLINIC_OR_DEPARTMENT_OTHER): Payer: Self-pay | Admitting: Student

## 2024-07-17 ENCOUNTER — Telehealth: Payer: Self-pay | Admitting: Orthopedic Surgery

## 2024-07-17 ENCOUNTER — Telehealth: Payer: Self-pay | Admitting: Student

## 2024-07-17 MED ORDER — ACETAMINOPHEN-CODEINE 300-30 MG PO TABS: 1.0000 | ORAL_TABLET | Freq: Every day | ORAL | 0 refills | Status: AC

## 2024-07-17 NOTE — Telephone Encounter (Signed)
 Patient called and said she needs a refill on Tylenol  3. CB#[phone]

## 2024-07-17 NOTE — Telephone Encounter (Signed)
 Patient called and said she is in extreme pain. Its her L leg going down to the knee and it keep swelling. CB#[phone]

## 2024-07-21 ENCOUNTER — Ambulatory Visit: Admitting: Orthopaedic Surgery

## 2024-07-21 ENCOUNTER — Encounter: Payer: Self-pay | Admitting: Orthopaedic Surgery

## 2024-07-21 DIAGNOSIS — M25552 Pain in left hip: Secondary | ICD-10-CM

## 2024-07-21 MED ORDER — METHYLPREDNISOLONE ACETATE 40 MG/ML IJ SUSP
40.0000 mg | INTRAMUSCULAR | Status: AC | PRN
  Administered 2024-07-21: 40 mg via INTRA_ARTICULAR

## 2024-07-21 MED ORDER — BUPIVACAINE HCL 0.5 % IJ SOLN
3.0000 mL | INTRAMUSCULAR | Status: AC | PRN
  Administered 2024-07-21: 3 mL via INTRA_ARTICULAR

## 2024-07-21 MED ORDER — LIDOCAINE HCL 1 % IJ SOLN
3.0000 mL | INTRAMUSCULAR | Status: AC | PRN
  Administered 2024-07-21: 3 mL

## 2024-07-21 NOTE — Progress Notes (Signed)
 Office Visit Note   Patient: Wendy Carrillo           Date of Birth: 1941/03/06           MRN: [id number] Visit Date: 07/21/2024              Requested by: Valma Carwin, MD [id number] [place],  [place] [address] PCP: Valma Carwin, MD   Assessment & Plan: Visit Diagnoses:  1. Pain in left hip     Plan: History of Present Illness Wendy Carrillo is an 83 year old female who presents with left hip pain.  She has experienced left hip pain for the past week, localized to the hip and exacerbated by pressure. An injection four months ago provided relief until the pain recently returned. She has not engaged in physical therapy despite self-referring. She has lost approximately forty pounds over the past few months and is actively trying to lose more weight.  Physical Exam MUSCULOSKELETAL: Tenderness in left hip bursa.  Good range of motion of the hip.  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 Previous injection provided relief. - Administer left hip injection for pain relief. - Recommend physical therapy to strengthen surrounding muscles. - Advise on weight management to reduce pressure on the hip.  Follow-Up Instructions: No follow-ups on file.   Orders:  Orders Placed This Encounter  Procedures   Large Joint Inj: L greater trochanter   No orders of the defined types were placed in this encounter.     Procedures: Large Joint Inj: L greater trochanter on 07/21/2024 9:34 AM Indications: pain Details: 22 G needle Medications: 3 mL lidocaine  1 %; 3 mL bupivacaine  0.5 %; 40 mg methylPREDNISolone  acetate 40 MG/ML Outcome: tolerated well, no immediate complications Patient was prepped and draped in the usual sterile fashion.       Clinical Data: No additional findings.   Subjective: Chief Complaint  Patient  presents with   Left Hip - Pain    HPI  Review of Systems  Constitutional: Negative.   HENT: Negative.    Eyes: Negative.   Respiratory: Negative.    Cardiovascular: Negative.   Endocrine: Negative.   Musculoskeletal: Negative.   Neurological: Negative.   Hematological: Negative.   Psychiatric/Behavioral: Negative.    All other systems reviewed and are negative.    Objective: Vital Signs: There were no vitals taken for this visit.  Physical Exam Vitals and nursing note reviewed.  Constitutional:      Appearance: She is well-developed.  HENT:     Head: Atraumatic.     Nose: Nose normal.  Eyes:     Extraocular Movements: Extraocular movements intact.  Cardiovascular:     Pulses: Normal pulses.  Pulmonary:     Effort: Pulmonary effort is normal.  Abdominal:     Palpations: Abdomen is soft.  Musculoskeletal:     Cervical back: Neck supple.  Skin:    General: Skin is warm.     Capillary Refill: Capillary refill takes less than 2 seconds.  Neurological:     Mental Status: She is alert. Mental status is at baseline.  Psychiatric:        Behavior: Behavior normal.        Thought Content: Thought content normal.        Judgment: Judgment normal.     Past Medical History:  Diagnosis Date  Arthritis    all over   Cervicalgia    Chronic low back pain    Chronic lower back pain    Coronary artery calcification    cardiology--- dr t. [place]---  nuclear study in epic 05-06-2015 no ischedmia, nuclear ef 51%;  cardiac cath 10-23-2017 mild coronary calcification in all 3V without significant obstructive disease   Enlarged lymph node    left goin   GERD (gastroesophageal reflux disease)    History of duodenal ulcer    History of gastritis    History of hepatitis    1960  per pt had finger stick by needle was  in nursing school , does not remember  what type   Hypertension    followed by pcp   Spinal stenosis, lumbar    Ulnar neuropathy of right upper  extremity    Wears glasses    Wears partial dentures    upper    Family History  Problem Relation Age of Onset   Hypertension Mother    Stroke Mother    Diabetes Mother    Heart attack Father    Breast cancer Sister    Leukemia Brother    Breast cancer Sister    Breast cancer Sister    Heart disease Brother     Past Surgical History:  Procedure Laterality Date   ABDOMINAL HERNIA REPAIR  1980s   ANTERIOR AND POSTERIOR VAGINAL REPAIR  2015   W/  MID-URETHRAL SLING AND SACROSPINOUS FIXATION   BACK SURGERY     BREAST CYST EXCISION Bilateral    BREAST SURGERY Left    nipple taken off   CARPAL TUNNEL RELEASE Bilateral right 2010;  left 2012   CATARACT EXTRACTION W/ INTRAOCULAR LENS IMPLANT Left 2014   CATARACT EXTRACTION W/ INTRAOCULAR LENS IMPLANT Bilateral right 2018;  left ?   CHOLECYSTECTOMY OPEN  1980's   AND APPENDECTOMY   COLONOSCOPY W/ BIOPSIES AND POLYPECTOMY  last one 01/ 2017   DILATION AND CURETTAGE OF UTERUS  several   HERNIA REPAIR     LEFT HEART CATH AND CORONARY ANGIOGRAPHY N/A 10/23/2017   Procedure: LEFT HEART CATH AND CORONARY ANGIOGRAPHY;  Surgeon: Burnard Debby LABOR, MD;  Location: [facility];  Service: Cardiovascular;  Laterality: N/A;   LUMBAR LAMINECTOMY/DECOMPRESSION MICRODISCECTOMY Bilateral 06/09/2018   Procedure: Laminectomy and Foraminotomy - Lumbar Two,Lumbar Three - Lumbar One- Two - Lumbar Three-Four- bilateral;  Surgeon: Onetha Kuba, MD;  Location: [facility] OR;  Service: Neurosurgery;  Laterality: Bilateral;   LUMBAR LAMINECTOMY/DECOMPRESSION MICRODISCECTOMY Bilateral 06/01/2020   Procedure: Laminectomy and Foraminotomy - left - Lumbar Two-Lumbar Three -  - bilateral - Lumbar Four-Lumbar Five;  Surgeon: Onetha Kuba, MD;  Location: [facility] OR;  Service: Neurosurgery;  Laterality: Bilateral;  Laminectomy and Foraminotomy - left - Lumbar Two-Lumbar Three -  - bilateral - Lumbar Four-Lumbar Five    LYMPH NODE BIOPSY Left 11/28/2020   Procedure: LEFT GROIN  LYMPH NODE EXCISIONAL BIOPSY;  Surgeon: Stevie, Herlene Righter, MD;  Location: [facility];  Service: General;  Laterality: Left;   MULTIPLE TOOTH EXTRACTIONS     PATELLA FRACTURE SURGERY Left 1990s   after the replacement   REDUCTION MAMMAPLASTY Bilateral 2004   TOTAL KNEE ARTHROPLASTY Bilateral left 1992;  right 2001   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

## 2024-07-22 ENCOUNTER — Other Ambulatory Visit: Payer: Self-pay

## 2024-07-22 ENCOUNTER — Telehealth: Payer: Self-pay | Admitting: Orthopaedic Surgery

## 2024-07-22 DIAGNOSIS — M25552 Pain in left hip: Secondary | ICD-10-CM

## 2024-07-22 NOTE — Telephone Encounter (Signed)
 Patient called she would like a referral for PT sent to [facility] in [facility].

## 2024-07-22 NOTE — Telephone Encounter (Signed)
 Referral placed.

## 2024-07-27 ENCOUNTER — Other Ambulatory Visit: Payer: Self-pay | Admitting: Physician Assistant

## 2024-07-27 MED ORDER — HYDROCODONE-ACETAMINOPHEN 5-325 MG PO TABS: 1.0000 | ORAL_TABLET | Freq: Two times a day (BID) | ORAL | 0 refills | Status: DC | PRN

## 2024-07-27 NOTE — Telephone Encounter (Signed)
 Most recently seen Dr.Xu. Please advise.

## 2024-07-27 NOTE — Telephone Encounter (Signed)
 Looks like she just had a hip injection on 07/21/24.  Can you let her know it can take up to two weeks to kick in.  I have sent in norco to take in the meantime.  If still in pain after two weeks of injection, I would follow back up with xu

## 2024-07-28 NOTE — Telephone Encounter (Signed)
Called and relayed to patient. °

## 2024-08-11 ENCOUNTER — Telehealth: Payer: Self-pay | Admitting: Orthopaedic Surgery

## 2024-08-11 ENCOUNTER — Other Ambulatory Visit: Payer: Self-pay | Admitting: Physician Assistant

## 2024-08-11 MED ORDER — TRAMADOL HCL 50 MG PO TABS: 50.0000 mg | ORAL_TABLET | Freq: Two times a day (BID) | ORAL | 0 refills | Status: DC | PRN

## 2024-08-11 NOTE — Telephone Encounter (Signed)
 Sent in tramadol

## 2024-08-11 NOTE — Telephone Encounter (Signed)
 Hydrocodone . [facility] [address] in [place]

## 2024-08-11 NOTE — Telephone Encounter (Signed)
 Pt asking for different meds for pain. Please send to [facility]. Pt phone number is [phone].

## 2024-08-11 NOTE — Telephone Encounter (Signed)
 Notified patient.

## 2024-08-25 ENCOUNTER — Telehealth: Payer: Self-pay | Admitting: Orthopaedic Surgery

## 2024-08-25 ENCOUNTER — Other Ambulatory Visit: Payer: Self-pay | Admitting: Physician Assistant

## 2024-08-25 MED ORDER — TRAMADOL HCL 50 MG PO TABS: 50.0000 mg | ORAL_TABLET | Freq: Two times a day (BID) | ORAL | 1 refills | Status: DC | PRN

## 2024-08-25 NOTE — Telephone Encounter (Signed)
 I refilled tramadol  and have changed it from one pill to one to two pills bid prn pain.  Cannot write anything stronger.  Sent to pharmacy on file

## 2024-08-25 NOTE — Telephone Encounter (Signed)
 Pt called stating they were up all night in pain and needs a stronger pain med or the dosage needs to be upped. Pt call back number is [phone]

## 2024-08-25 NOTE — Telephone Encounter (Signed)
 Called and notified patient.

## 2024-08-26 ENCOUNTER — Telehealth: Payer: Self-pay | Admitting: Orthopaedic Surgery

## 2024-08-26 NOTE — Telephone Encounter (Signed)
 Pt called statin that the Tramadol  she was prescribed isn't working and she needs something stronger. [facility] [address]. Pt call back number is [phone]

## 2024-08-26 NOTE — Telephone Encounter (Signed)
 Cannot send in anything stronger.  Can put a referral in to pain mgmt if she would like

## 2024-09-07 ENCOUNTER — Emergency Department (HOSPITAL_BASED_OUTPATIENT_CLINIC_OR_DEPARTMENT_OTHER)

## 2024-09-07 ENCOUNTER — Encounter (HOSPITAL_BASED_OUTPATIENT_CLINIC_OR_DEPARTMENT_OTHER): Payer: Self-pay | Admitting: Emergency Medicine

## 2024-09-07 ENCOUNTER — Emergency Department (HOSPITAL_BASED_OUTPATIENT_CLINIC_OR_DEPARTMENT_OTHER)
Admission: EM | Admit: 2024-09-07 | Discharge: 2024-09-07 | Disposition: A | Discharge status: Home / Self Care | Attending: Emergency Medicine | Admitting: Emergency Medicine

## 2024-09-07 ENCOUNTER — Encounter: Payer: Self-pay | Admitting: Radiology

## 2024-09-07 ENCOUNTER — Other Ambulatory Visit: Payer: Self-pay

## 2024-09-07 DIAGNOSIS — Z96652 Presence of left artificial knee joint: Secondary | ICD-10-CM | POA: Diagnosis not present

## 2024-09-07 DIAGNOSIS — M79605 Pain in left leg: Secondary | ICD-10-CM | POA: Insufficient documentation

## 2024-09-07 DIAGNOSIS — M545 Low back pain, unspecified: Secondary | ICD-10-CM | POA: Diagnosis present

## 2024-09-07 DIAGNOSIS — M5442 Lumbago with sciatica, left side: Secondary | ICD-10-CM | POA: Insufficient documentation

## 2024-09-07 DIAGNOSIS — M25552 Pain in left hip: Secondary | ICD-10-CM | POA: Insufficient documentation

## 2024-09-07 MED ORDER — HYDROCODONE-ACETAMINOPHEN 5-325 MG PO TABS: 1.0000 | ORAL_TABLET | ORAL | 0 refills | Status: AC | PRN

## 2024-09-07 MED ORDER — HYDROCODONE-ACETAMINOPHEN 5-325 MG PO TABS
1.0000 | ORAL_TABLET | Freq: Once | ORAL | Status: AC
Start: 2024-09-07 — End: 2024-09-07
  Administered 2024-09-07: 1 via ORAL
  Filled 2024-09-07: qty 1

## 2024-09-07 MED ORDER — METHOCARBAMOL 500 MG PO TABS: 500.0000 mg | ORAL_TABLET | Freq: Two times a day (BID) | ORAL | 0 refills | Status: AC

## 2024-09-07 NOTE — Discharge Instructions (Signed)
 You were given a prescription for muscle relaxers, please take 1 tablet twice a day for the next 7 days.  In addition, you were given medication to help with severe pain, please take this as prescribed.  Please be aware this medication can cause constipation, please change your regimen as needed.

## 2024-09-07 NOTE — ED Triage Notes (Addendum)
 Pt c/o L hip pain radiating to knee, x 1 week  Also L shoulder pain for same period of time.   Also reports L foot and knee swelling, x 1 week.  Denies known injury. Took 2 tramadol  and a muscle relaxer earlier today.

## 2024-09-07 NOTE — ED Provider Notes (Signed)
 [facility] Provider Note   CSN: [id number] Arrival date & time: 09/07/24  1504     Patient presents with: Hip Pain and Shoulder Pain   Wendy Carrillo is a 83 y.o. female.   83 y.o female with a past medical history of spinal stenosis, chronic low back pain, left knee replacement presents to the ED with a chief complaint of left hip, left knee pain which has been ongoing for the past several days.  Reports prior knee replacement was done approximately 30 years ago by Dr. Okey, reports send she has been following up with Dr. Addie for her knee.  She also did receive a injection to her lumbar spine by Dr. Patria last month which did not help with any of her pain.  She complains of worsening pain to the left knee exacerbated with any type of weightbearing and ambulation.  She has tried taking some over-the-counter medicine, hydrocodone  without much improvement in symptoms.  He also tells me that she has a prior history of a blood clot, reports he is currently not on any blood thinners and has noticed her leg calf to be more swollen and painful than usual.  Nuys any trauma, injury or other complaints reported.  The history is provided by the patient.  Hip Pain This is a new problem. The current episode started less than 1 hour ago. The problem occurs constantly. The problem has been gradually worsening. Pertinent negatives include no chest pain and no shortness of breath.  Shoulder Pain Associated symptoms: back pain   Associated symptoms: no fever        Prior to Admission medications   Medication Sig Start Date End Date Taking? Authorizing Provider  HYDROcodone -acetaminophen  (NORCO/VICODIN) 5-325 MG tablet Take 1 tablet by mouth every 4 (four) hours as needed. 09/07/24  Yes Emil Share, DO  methocarbamol  (ROBAXIN ) 500 MG tablet Take 1 tablet (500 mg total) by mouth 2 (two) times daily for 7 days. 09/07/24 09/14/24 Yes Royelle Hinchman, PA-C   acetaminophen -codeine  (TYLENOL  #3) 300-30 MG tablet Take 1-2 tablets by mouth daily. 07/17/24   Overturf, Jackson L, PA-C  ALPRAZolam  (XANAX ) 0.25 MG tablet Take 1 tablet (0.25 mg total) by mouth daily as needed for anxiety. Patient not taking: Reported on 07/01/2024 10/15/22   Bryn Bernardino NOVAK, MD  azithromycin (ZITHROMAX) 250 MG tablet Take 250-500 mg by mouth as directed. Take 2 tablets on day 1 and then1 tablet daily on days 2 through 5. 06/26/24   [provider]  benzonatate (TESSALON) 200 MG capsule Take 200 mg by mouth 3 (three) times daily as needed. 06/23/24   [provider]  cefpodoxime  (VANTIN ) 200 MG tablet Take 1 tablet (200 mg total) by mouth 2 (two) times daily. Patient not taking: Reported on 07/01/2024 10/18/23   Griselda Norris, MD  celecoxib  (CELEBREX ) 200 MG capsule Take 200 mg by mouth daily. 04/01/24   [provider]  cyanocobalamin  (VITAMIN B12) 1000 MCG tablet Take 1 tablet (1,000 mcg total) by mouth daily. 10/15/22   Bryn Bernardino NOVAK, MD  desloratadine (CLARINEX) 5 MG tablet Take 5 mg by mouth daily. 06/23/24   [provider]  diphenhydrAMINE  (BENADRYL ) 50 MG tablet Take 1 tablet (50 mg total) by mouth as directed. Take 1 tablet 02/03/24 at 10:00AM with last dose of Prednisone . Patient not taking: Reported on 07/01/2024 01/29/24   Cristal Anes, MD  esomeprazole (NEXIUM) 40 MG capsule Take 40 mg by mouth as needed (heartburn).  09/13/22   [provider]  fluticasone  (FLONASE ) 50 MCG/ACT nasal spray Place 1 spray into both nostrils daily as needed for allergies or rhinitis. 10/15/22   Bryn Bernardino NOVAK, MD  furosemide  (LASIX ) 40 MG tablet Take 40 mg by mouth daily as needed for fluid. 05/05/24   [provider]  gabapentin  (NEURONTIN ) 300 MG capsule TAKE 2 CAPSULES BY MOUTH THREE TIMES PER DAY. CAN SLOWLY INCREASE FROM CURRENT PRESCRIPTION. Patient taking differently: Take 300-600 mg by mouth daily as needed (for pain). 10/05/22   Williams,  Megan E, NP  guaiFENesin  (ROBITUSSIN) 100 MG/5ML liquid Take 5-10 mLs (100-200 mg total) by mouth every 4 (four) hours as needed for cough or to loosen phlegm. 07/01/24   Freddi Hamilton, MD  hydrOXYzine  (ATARAX ) 10 MG tablet One tab every 12 hours as needed for itching Patient taking differently: Take 10 mg by mouth as needed for itching. 03/25/22   Jarold Medici, MD  Multiple Vitamins-Minerals (AIRBORNE GUMMIES PO) Take 2 tablets by mouth daily.    [provider]  nitrofurantoin  (MACRODANTIN ) 100 MG capsule Take 100 mg by mouth every 6 (six) hours. Patient not taking: Reported on 07/01/2024 06/11/24   [provider]  ondansetron  (ZOFRAN -ODT) 4 MG disintegrating tablet Take 1 tablet (4 mg total) by mouth every 8 (eight) hours as needed for nausea or vomiting. Patient not taking: Reported on 07/01/2024 10/18/23   Griselda Norris, MD  predniSONE  (DELTASONE ) 50 MG tablet Pt to take 50 mg of prednisone  on 04/19/24 at 9:00PM, 50 mg of prednisone  on 04/20/24 at 3:00AM, and 50 mg of prednisone  on 04/20/24 at 9:00AM. Pt is also to take 50 mg of benadryl  on 04/20/24 at 9:00AM. Please call [phone] with any questions. Patient not taking: Reported on 07/01/2024 04/16/24   Karalee Wilkie POUR, MD  Vitamin D , Ergocalciferol , (DRISDOL ) 50000 UNITS CAPS capsule Take 50,000 Units by mouth every Thursday.    [provider]    Allergies: Contrast media [iodinated contrast media], Penicillins, Cyclobenzaprine , Lactose intolerance (gi), Tilactase, Baclofen, Latex, Other, and Tape    Review of Systems  Constitutional:  Negative for chills and fever.  Respiratory:  Negative for shortness of breath.   Cardiovascular:  Negative for chest pain.  Musculoskeletal:  Positive for arthralgias and back pain.  All other systems reviewed and are negative.   Updated Vital Signs BP 117/67   Pulse 66   Temp 98.2 F (36.8 C) (Oral)   Resp 19   Ht 5' 4 (1.626 m)   Wt 90.7 kg   SpO2 95%   BMI  34.33 kg/m   Physical Exam Vitals and nursing note reviewed.  Constitutional:      Appearance: Normal appearance.  HENT:     Head: Normocephalic and atraumatic.  Eyes:     Pupils: Pupils are equal, round, and reactive to light.  Cardiovascular:     Rate and Rhythm: Normal rate.  Pulmonary:     Effort: Pulmonary effort is normal.  Musculoskeletal:     Cervical back: Normal range of motion and neck supple.     Lumbar back: Tenderness present. No spasms.       Back:     Left knee: Tenderness present over the lateral joint line.     Comments: RLE- KF,KE 5/5 strength LLE- HF, HE 5/5 strength    Skin:    General: Skin is warm and dry.  Neurological:     Mental Status: She is alert and oriented to person, place, and time.     (  all labs ordered are listed, but only abnormal results are displayed) Labs Reviewed - No data to display  EKG: None  Radiology: DG Hip Unilat W or Wo Pelvis 2-3 Views Left Result Date: 09/07/2024 CLINICAL DATA:  Left hip pain radiating to knee for 1 week EXAM: DG HIP (WITH OR WITHOUT PELVIS) 2-3V LEFT COMPARISON:  09/03/2023 FINDINGS: Frontal view of the pelvis as well as frontal and cross-table lateral views of the left hip are obtained. No acute displaced fracture, subluxation, or dislocation. Symmetrical bilateral hip osteoarthritis. Stable lower lumbar degenerative changes. IMPRESSION: 1. No acute displaced fracture. 2. Degenerative changes of the lumbar spine and bilateral hips. Electronically Signed   By: Ozell Daring M.D.   On: 09/07/2024 18:10   US  Venous Img Lower Unilateral Left Result Date: 09/07/2024 CLINICAL DATA:  Left leg pain for 2 weeks EXAM: LEFT LOWER EXTREMITY VENOUS DOPPLER ULTRASOUND TECHNIQUE: Gray-scale sonography with compression, as well as color and duplex ultrasound, were performed to evaluate the deep venous system(s) from the level of the common femoral vein through the popliteal and proximal calf veins. COMPARISON:   10/01/2022 FINDINGS: VENOUS Normal compressibility of the common femoral, superficial femoral, and popliteal veins, as well as the visualized calf veins. Visualized portions of profunda femoral vein and great saphenous vein unremarkable. No filling defects to suggest DVT on grayscale or color Doppler imaging. Doppler waveforms show normal direction of venous flow, normal respiratory plasticity and response to augmentation. Limited views of the contralateral common femoral vein are unremarkable. OTHER None. Limitations: none IMPRESSION: 1. No evidence of deep venous thrombosis within the left lower extremity. Electronically Signed   By: Ozell Daring M.D.   On: 09/07/2024 18:07   DG Knee Complete 4 Views Left Result Date: 09/07/2024 EXAM: 4 OR MORE VIEW(S) XRAY OF THE LEFT KNEE 09/07/2024 05:24:00 PM COMPARISON: X-ray knee 04/02/2024 CLINICAL HISTORY: pain FINDINGS: BONES AND JOINTS: Total knee arthroplasty. No acute fracture. No joint dislocation. No significant joint effusion. SOFT TISSUES: The soft tissues are unremarkable. Stable couple of soft tissue 1 mm densities anterior to the tibia along the soft tissues. IMPRESSION: 1. Total knee arthroplasty without acute complication detected. Electronically signed by: Morgane Naveau MD 09/07/2024 05:52 PM EST RP Workstation: [id number]     Procedures   Medications Ordered in the ED  HYDROcodone -acetaminophen  (NORCO/VICODIN) 5-325 MG per tablet 1 tablet (1 tablet Oral Given 09/07/24 1747)                                    Medical Decision Making Amount and/or Complexity of Data Reviewed Radiology: ordered.  Risk Prescription drug management.    Patient here with ongoing pain to the left hip, left knee which has been ongoing for the past couple of days.  She does have an underlying history of spinal stenosis and chronic low back pain, she also has a history of left knee replacement.  She does note that she has tried to reach out to Dr. Addie who  sees her for her knee however has an appointment at the middle of this month.  She also previously received injections from Dr.Xu but states the one last month did not relieve any of her pain.  She is complaining of worsening pain especially with ambulation.  She is also concerned she is having some left calf pain concerning for a blood clot.  On evaluation there is significant pain along the left calf.  Left knee without any pain to palpation aside from some mild tenderness along the lateral aspect of her knee.  This is not a native knee.  No erythema, no palpable effusion noted to be concern for infection.    She also tells me there is concern of left lower back pain.  X-ray of the left lumbar spine without any acute finding.  Left knee x-ray without any acute finding or effusion.  Ultrasound DVT study of the left leg without any findings.  We discussed supportive treatment with pain control, follow-up with her orthopedist as scheduled.  She is agreeable to plan and treatment.  She also tells me that she was given a prescription for tramadol  which she has taken for several years and does nothing for her.  We discussed appropriate follow-up at this time.  She is hemodynamically stable for discharge.     Portions of this note were generated with Scientist, clinical (histocompatibility and immunogenetics). Dictation errors may occur despite best attempts at proofreading.   Final diagnoses:  Left leg pain  Acute left-sided low back pain with left-sided sciatica    ED Discharge Orders          Ordered    methocarbamol  (ROBAXIN ) 500 MG tablet  2 times daily        09/07/24 1824    HYDROcodone -acetaminophen  (NORCO/VICODIN) 5-325 MG tablet  Every 4 hours PRN        09/07/24 1828               Izzy Doubek, PA-C 09/07/24 1840    Floyd, Dan, DO 09/07/24 1843

## 2024-09-24 ENCOUNTER — Ambulatory Visit: Admitting: Orthopedic Surgery

## 2024-11-21 ENCOUNTER — Other Ambulatory Visit: Payer: Self-pay

## 2024-11-21 ENCOUNTER — Emergency Department (HOSPITAL_BASED_OUTPATIENT_CLINIC_OR_DEPARTMENT_OTHER)
Admission: EM | Admit: 2024-11-21 | Discharge: 2024-11-22 | Disposition: A | Attending: Emergency Medicine | Admitting: Emergency Medicine

## 2024-11-21 DIAGNOSIS — R600 Localized edema: Secondary | ICD-10-CM | POA: Diagnosis not present

## 2024-11-21 DIAGNOSIS — Z91040 Latex allergy status: Secondary | ICD-10-CM | POA: Diagnosis not present

## 2024-11-21 DIAGNOSIS — Z79899 Other long term (current) drug therapy: Secondary | ICD-10-CM | POA: Diagnosis not present

## 2024-11-21 DIAGNOSIS — I10 Essential (primary) hypertension: Secondary | ICD-10-CM | POA: Diagnosis not present

## 2024-11-21 DIAGNOSIS — M79605 Pain in left leg: Secondary | ICD-10-CM | POA: Diagnosis present

## 2024-11-21 DIAGNOSIS — M545 Low back pain, unspecified: Secondary | ICD-10-CM | POA: Diagnosis not present

## 2024-11-21 DIAGNOSIS — M7122 Synovial cyst of popliteal space [Baker], left knee: Secondary | ICD-10-CM | POA: Diagnosis not present

## 2024-11-21 DIAGNOSIS — M7989 Other specified soft tissue disorders: Secondary | ICD-10-CM | POA: Diagnosis present

## 2024-11-21 NOTE — ED Triage Notes (Signed)
 Pain x 2 days in the back of her left knee. 1 hour PTA she had a wave of severe left hip. Hx of blood clot.

## 2024-11-21 NOTE — ED Notes (Signed)
 Patient reports atraumatic left posterior knee pain x 2 days. Patient states that, following standing up after sitting down for an event today for >3 hours, patient had sudden onset severe left hip pain that radiated up to lower back and then down entirety of left leg. Patient now states that knee pain wraps around to the anterior aspect and is accompanied by swelling. 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 Patient is not anticoagulated. No medications PTA.

## 2024-11-21 NOTE — ED Notes (Signed)
 Patient transferred from waiting room to ED treatment room. Assuming pt care at this time.

## 2024-11-22 ENCOUNTER — Emergency Department (HOSPITAL_BASED_OUTPATIENT_CLINIC_OR_DEPARTMENT_OTHER): Admit: 2024-11-22 | Discharge: 2024-11-22 | Disposition: A | Attending: Emergency Medicine

## 2024-11-22 ENCOUNTER — Encounter (HOSPITAL_BASED_OUTPATIENT_CLINIC_OR_DEPARTMENT_OTHER): Payer: Self-pay

## 2024-11-22 ENCOUNTER — Emergency Department (HOSPITAL_BASED_OUTPATIENT_CLINIC_OR_DEPARTMENT_OTHER)
Admission: EM | Admit: 2024-11-22 | Discharge: 2024-11-22 | Disposition: A | Discharge status: Home / Self Care | Attending: Emergency Medicine | Admitting: Emergency Medicine

## 2024-11-22 DIAGNOSIS — M545 Low back pain, unspecified: Secondary | ICD-10-CM | POA: Insufficient documentation

## 2024-11-22 DIAGNOSIS — Z79899 Other long term (current) drug therapy: Secondary | ICD-10-CM | POA: Insufficient documentation

## 2024-11-22 DIAGNOSIS — I10 Essential (primary) hypertension: Secondary | ICD-10-CM | POA: Insufficient documentation

## 2024-11-22 DIAGNOSIS — Z91040 Latex allergy status: Secondary | ICD-10-CM | POA: Insufficient documentation

## 2024-11-22 DIAGNOSIS — M79605 Pain in left leg: Secondary | ICD-10-CM

## 2024-11-22 DIAGNOSIS — M7122 Synovial cyst of popliteal space [Baker], left knee: Secondary | ICD-10-CM | POA: Insufficient documentation

## 2024-11-22 MED ORDER — ACETAMINOPHEN 500 MG PO TABS
1000.0000 mg | ORAL_TABLET | Freq: Once | ORAL | Status: AC
  Administered 2024-11-22: 1000 mg via ORAL
  Filled 2024-11-22: qty 2

## 2024-11-22 MED ORDER — OXYCODONE HCL 5 MG PO TABS
5.0000 mg | ORAL_TABLET | Freq: Once | ORAL | Status: AC
  Administered 2024-11-22: 5 mg via ORAL
  Filled 2024-11-22: qty 1

## 2024-11-22 MED ORDER — ONDANSETRON 4 MG PO TBDP
4.0000 mg | ORAL_TABLET | Freq: Once | ORAL | Status: AC
  Administered 2024-11-22: 4 mg via ORAL
  Filled 2024-11-22: qty 1

## 2024-11-22 MED ORDER — HYDROMORPHONE HCL 1 MG/ML IJ SOLN
1.0000 mg | Freq: Once | INTRAMUSCULAR | Status: AC
  Administered 2024-11-22: 1 mg via INTRAMUSCULAR
  Filled 2024-11-22: qty 1

## 2024-11-22 MED ORDER — APIXABAN 5 MG PO TABS
10.0000 mg | ORAL_TABLET | Freq: Two times a day (BID) | ORAL | Status: DC
  Administered 2024-11-22: 10 mg via ORAL
  Filled 2024-11-22: qty 2

## 2024-11-22 MED ORDER — METHYLPREDNISOLONE 4 MG PO TBPK: ORAL_TABLET | ORAL | 0 refills | Status: AC

## 2024-11-22 NOTE — ED Notes (Signed)
 Pt reporting R sided head pain. EDP notified.

## 2024-11-22 NOTE — ED Provider Notes (Signed)
 " [place] [facility] AT [facility] Provider Note   CSN: [id number] Arrival date & time: 11/21/24  2259     History Chief Complaint  Patient presents with   Hip Pain    HPI Wendy Carrillo is a 84 y.o. female presenting for chief complaint of leg and hip pain. Hx of blood clot and states that her left leg started swelling and hurting from the hip down to the left calf Denies fevers chills nausea vomitting syncope SOB States that she was recently at a funeral that lasted over 2 hours and she did not stand for the whole thing.  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  Not on anticoagulation any longer. Patient's recorded medical, surgical, social, medication list and allergies were reviewed in the Snapshot window as part of the initial history.   Review of Systems   Review of Systems  Constitutional:  Negative for chills and fever.  HENT:  Negative for ear pain and sore throat.   Eyes:  Negative for pain and visual disturbance.  Respiratory:  Negative for cough and shortness of breath.   Cardiovascular:  Positive for leg swelling. Negative for chest pain and palpitations.  Gastrointestinal:  Negative for abdominal pain and vomiting.  Genitourinary:  Negative for dysuria and hematuria.  Musculoskeletal:  Negative for arthralgias and back pain.  Skin:  Negative for color change and rash.  Neurological:  Negative for seizures and syncope.  All other systems reviewed and are negative.   Physical Exam Updated Vital Signs BP 128/78   Pulse 85   Temp 97.6 F (36.4 C) (Oral)   Resp 17   Ht 5' 4 (1.626 m)   Wt 90.3 kg   SpO2 100%   BMI 34.16 kg/m  Physical Exam Constitutional:      General: She is not in acute distress.    Appearance: She is not ill-appearing or toxic-appearing.  HENT:     Head: Normocephalic and atraumatic.  Eyes:     Extraocular Movements: Extraocular movements intact.     Pupils: Pupils are equal, round, and reactive to  light.  Cardiovascular:     Rate and Rhythm: Normal rate.  Pulmonary:     Effort: No respiratory distress.  Abdominal:     General: Abdomen is flat.  Musculoskeletal:        General: No swelling, deformity or signs of injury.     Cervical back: Normal range of motion. No rigidity.     Right lower leg: No edema.     Left lower leg: Edema present.  Skin:    General: Skin is warm and dry.  Neurological:     General: No focal deficit present.     Mental Status: She is alert and oriented to person, place, and time.  Psychiatric:        Mood and Affect: Mood normal.      ED Course/ Medical Decision Making/ A&P    Procedures Procedures   Medications Ordered in ED Medications  apixaban  (ELIQUIS ) tablet 10 mg (10 mg Oral Given 11/22/24 0042)  acetaminophen  (TYLENOL ) tablet 1,000 mg (1,000 mg Oral Given 11/22/24 0043)  oxyCODONE  (Oxy IR/ROXICODONE ) immediate release tablet 5 mg (5 mg Oral Given 11/22/24 0042)    Medical Decision Making:    84 year old female with left lower extremity swelling.  History of DVT.  No evidence of PE or ACS at this time. Discussed with patient that most appropriate treatment would be rule out of DVT  unfortunately not available at this time at night. Will start patient on Eliquis  given history of DVT and recommend she return in the a.m. for formal DVT study.  She expressed understanding of the plan.  Strict return precautions reinforced.  Disposition:  I have considered need for hospitalization, however, considering all of the above, I believe this patient is stable for discharge at this time.  Patient/family educated about specific return precautions for given chief complaint and symptoms.  Patient/family educated about follow-up with PCP and here in the AM.     tient/family expressed understanding of return precautions and need for follow-up. Patient spoken to regarding all imaging and laboratory results and appropriate follow up for these results. All  education provided in verbal form with additional information in written form. Time was allowed for answering of patient questions. Patient discharged.    Emergency Department Medication Summary:   Medications  apixaban  (ELIQUIS ) tablet 10 mg (10 mg Oral Given 11/22/24 0042)  acetaminophen  (TYLENOL ) tablet 1,000 mg (1,000 mg Oral Given 11/22/24 0043)  oxyCODONE  (Oxy IR/ROXICODONE ) immediate release tablet 5 mg (5 mg Oral Given 11/22/24 0042)         Clinical Impression:  1. Leg swelling      Discharge   Final Clinical Impression(s) / ED Diagnoses Final diagnoses:  Leg swelling    Rx / DC Orders ED Discharge Orders          Ordered    Lower Ext Left Venous US        Comments: Specimen [id number]: IMPORTANT PATIENT INSTRUCTIONS:  You have been scheduled for an Outpatient Ultrasound.  Your appointment has been scheduled for:  _______ am/pm on _______________ (date).If your appointment is scheduled for a Saturday, Sunday or holiday, please go to the [facility] Emergency Department Registration Desk at least 15 minutes prior to your appointment time and tell them you are there for an ultrasound.  If your appointment is scheduled for a weekday (Monday - Friday), please go directly to the [facility] Radiology Department reception area at least 15 minutes prior to your appointment time and tell them you are there for an ultrasound.Please call [phone] with questions.    11/22/24 9970              Jerral Meth, MD 11/22/24 9861  "

## 2024-11-22 NOTE — ED Triage Notes (Signed)
 Pt had US  of left leg to rule out DVT.  Continues to have left leg and hip pian. States she has a bruise on her left hip.Denies fall. No thinners

## 2024-11-22 NOTE — Discharge Instructions (Signed)
 Your ultrasound showed a Baker's cyst behind your left knee, no blood clots noted.  Please continue your home medication.  Be mindful to use a stool softener such as Colace to help prevent constipation if taking pain medication on a daily basis.  Please follow-up with your orthopedist for further evaluation and treatment.

## 2024-11-22 NOTE — ED Provider Notes (Signed)
 " [place] [facility] AT [facility] Provider Note   CSN: [id number] Arrival date & time: 11/22/24  1333     Patient presents with: Leg Pain   Wendy Carrillo is a 84 y.o. female.   Patient with history of hypertension, multiple lower extremity orthopedic surgeries, chronic lower back pain/left lumbar radiculopathy --presents to the emergency department today for evaluation of pain in the left hip with radiation down the left leg to the knee.  Patient was seen in the emergency department early this morning, treated with Tylenol  and oxycodone , given a dose of Eliquis  and scheduled for an ultrasound.  Patient return for the ultrasound and had this performed (currently awaiting results).  Patient checked back in because she had uncontrolled pain in her leg.  She states that the medicine that she received last night did not do anything for her and she had a terrible night, unable to sleep.  She is questing additional pain management.  She feels that her leg is somewhat swollen.  She denies fevers.  Patient states that she is prescribed hydrocodone .  She tells me that she has not had pain medicine in the past 3 weeks due to trying to wean myself off.  Controlled substance reporting database shows #60 hydrocodone  10-325 filled 11/06/2024.  Patient's family member at bedside states that the patient has been laying in bed recently due to uncontrolled pain.       Prior to Admission medications  Medication Sig Start Date End Date Taking? Authorizing Provider  acetaminophen -codeine  (TYLENOL  #3) 300-30 MG tablet Take 1-2 tablets by mouth daily. 07/17/24   Overturf, Jackson L, PA-C  ALPRAZolam  (XANAX ) 0.25 MG tablet Take 1 tablet (0.25 mg total) by mouth daily as needed for anxiety. Patient not taking: Reported on 07/01/2024 10/15/22   Bryn Bernardino NOVAK, MD  azithromycin (ZITHROMAX) 250 MG tablet Take 250-500 mg by mouth as directed. Take 2 tablets on day 1 and then1 tablet daily on days  2 through 5. 06/26/24   [provider]  benzonatate (TESSALON) 200 MG capsule Take 200 mg by mouth 3 (three) times daily as needed. 06/23/24   [provider]  cefpodoxime  (VANTIN ) 200 MG tablet Take 1 tablet (200 mg total) by mouth 2 (two) times daily. Patient not taking: Reported on 07/01/2024 10/18/23   Griselda Norris, MD  celecoxib  (CELEBREX ) 200 MG capsule Take 200 mg by mouth daily. 04/01/24   [provider]  cyanocobalamin  (VITAMIN B12) 1000 MCG tablet Take 1 tablet (1,000 mcg total) by mouth daily. 10/15/22   Bryn Bernardino NOVAK, MD  desloratadine (CLARINEX) 5 MG tablet Take 5 mg by mouth daily. 06/23/24   [provider]  diphenhydrAMINE  (BENADRYL ) 50 MG tablet Take 1 tablet (50 mg total) by mouth as directed. Take 1 tablet 02/03/24 at 10:00AM with last dose of Prednisone . Patient not taking: Reported on 07/01/2024 01/29/24   Cristal Anes, MD  esomeprazole (NEXIUM) 40 MG capsule Take 40 mg by mouth as needed (heartburn). 09/13/22   [provider]  fluticasone  (FLONASE ) 50 MCG/ACT nasal spray Place 1 spray into both nostrils daily as needed for allergies or rhinitis. 10/15/22   Bryn Bernardino NOVAK, MD  furosemide  (LASIX ) 40 MG tablet Take 40 mg by mouth daily as needed for fluid. 05/05/24   [provider]  gabapentin  (NEURONTIN ) 300 MG capsule TAKE 2 CAPSULES BY MOUTH THREE TIMES PER DAY. CAN SLOWLY INCREASE FROM CURRENT PRESCRIPTION. Patient taking differently: Take 300-600 mg by mouth daily as  needed (for pain). 10/05/22   Williams, Megan E, NP  guaiFENesin  (ROBITUSSIN) 100 MG/5ML liquid Take 5-10 mLs (100-200 mg total) by mouth every 4 (four) hours as needed for cough or to loosen phlegm. 07/01/24   Freddi Hamilton, MD  HYDROcodone -acetaminophen  (NORCO/VICODIN) 5-325 MG tablet Take 1 tablet by mouth every 4 (four) hours as needed. 09/07/24   Floyd, Dan, DO  hydrOXYzine  (ATARAX ) 10 MG tablet One tab every 12 hours as needed for itching Patient taking  differently: Take 10 mg by mouth as needed for itching. 03/25/22   Jarold Medici, MD  Multiple Vitamins-Minerals (AIRBORNE GUMMIES PO) Take 2 tablets by mouth daily.    [provider]  nitrofurantoin  (MACRODANTIN ) 100 MG capsule Take 100 mg by mouth every 6 (six) hours. Patient not taking: Reported on 07/01/2024 06/11/24   [provider]  ondansetron  (ZOFRAN -ODT) 4 MG disintegrating tablet Take 1 tablet (4 mg total) by mouth every 8 (eight) hours as needed for nausea or vomiting. Patient not taking: Reported on 07/01/2024 10/18/23   Griselda Norris, MD  predniSONE  (DELTASONE ) 50 MG tablet Pt to take 50 mg of prednisone  on 04/19/24 at 9:00PM, 50 mg of prednisone  on 04/20/24 at 3:00AM, and 50 mg of prednisone  on 04/20/24 at 9:00AM. Pt is also to take 50 mg of benadryl  on 04/20/24 at 9:00AM. Please call [phone] with any questions. Patient not taking: Reported on 07/01/2024 04/16/24   Karalee Wilkie POUR, MD  Vitamin D , Ergocalciferol , (DRISDOL ) 50000 UNITS CAPS capsule Take 50,000 Units by mouth every Thursday.    [provider]    Allergies: Contrast media [iodinated contrast media], Penicillins, Cyclobenzaprine , Lactose intolerance (gi), Tilactase, Baclofen, Latex, Other, and Tape    Review of Systems  Updated Vital Signs BP 123/65 (BP Location: Left Arm)   Pulse 78   Temp 97.7 F (36.5 C) (Oral)   Resp 17   SpO2 99%   Physical Exam Vitals and nursing note reviewed.  Constitutional:      Appearance: She is well-developed.  HENT:     Head: Normocephalic and atraumatic.  Eyes:     Pupils: Pupils are equal, round, and reactive to light.  Cardiovascular:     Pulses: Normal pulses. No decreased pulses.          Dorsalis pedis pulses are 2+ on the right side and 2+ on the left side.  Musculoskeletal:        General: Tenderness present.     Cervical back: Normal range of motion and neck supple. No tenderness. Normal range of motion.     Thoracic back: No  tenderness. Normal range of motion.     Lumbar back: Tenderness present. No bony tenderness.     Right hip: No tenderness. Normal range of motion.     Left hip: No tenderness. Normal range of motion.     Right upper leg: No tenderness.     Left upper leg: Tenderness present. No swelling or bony tenderness.     Right knee: No effusion. Normal range of motion.     Left knee: No effusion. Normal range of motion.     Right lower leg: No swelling. No edema.     Left lower leg: No swelling. No edema.     Comments: Healed surgical scars in left lower extremity.   Patient is able to lift her leg off the bed, flexing at the left hip with good strength.  Skin:    General: Skin is warm and dry.  Neurological:  Mental Status: She is alert.     Sensory: No sensory deficit.     Comments: Motor, sensation, and vascular distal to the injury is fully intact.   Psychiatric:        Mood and Affect: Mood normal.     (all labs ordered are listed, but only abnormal results are displayed) Labs Reviewed - No data to display  EKG: None  Radiology: Lower Ext Left Venous US  Result Date: 11/22/2024 CLINICAL DATA:  Left hip and leg pain. EXAM: LEFT LOWER EXTREMITY VENOUS DOPPLER ULTRASOUND TECHNIQUE: Gray-scale sonography with compression, as well as color and duplex ultrasound, were performed to evaluate the deep venous system(s) from the level of the common femoral vein through the popliteal and proximal calf veins. COMPARISON:  09/07/2024. FINDINGS: VENOUS Normal compressibility of the common femoral, superficial femoral, and popliteal veins, as well as the visualized calf veins. Visualized portions of profunda femoral vein and great saphenous vein unremarkable. No filling defects to suggest DVT on grayscale or color Doppler imaging. Doppler waveforms show normal direction of venous flow, normal respiratory plasticity and response to augmentation. Limited views of the contralateral common femoral vein are  unremarkable. OTHER A complex cyst is noted in the popliteal fossa on the left measuring 2.6 x 1.5 x 2.5 cm. A small amount of free fluid is noted in the left anterior knee. Limitations: none IMPRESSION: 1. No evidence of deep venous thrombosis. 2. 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 Electronically Signed   By: Leita Birmingham M.D.   On: 11/22/2024 14:49     Procedures   Medications Ordered in the ED  HYDROmorphone  (DILAUDID ) injection 1 mg (1 mg Intramuscular Given 11/22/24 1431)  ondansetron  (ZOFRAN -ODT) disintegrating tablet 4 mg (4 mg Oral Given 11/22/24 1430)   ED Course  Patient seen and examined. History obtained directly from patient and family member at bedside.   Labs/EKG: None ordered  Imaging: DVT study has been performed, pending results.  Medications/Fluids: Ordered: IM Dilaudid  1 mg, ODT Zofran  4 mg  Most recent vital signs reviewed and are as follows: BP 123/65 (BP Location: Left Arm)   Pulse 78   Temp 97.7 F (36.5 C) (Oral)   Resp 17   SpO2 99%   Initial impression: Left lower extremity pain, here to rule out DVT.  Pain seems more neuropathic in nature and may be a flare of patient's lumbar radiculopathy.  Current concern is for pain control.  Patient has been taking hydrocodone  10 mg to some degree recently.  She is here with her family member who can assist her and monitor.  Will give medication as above and reassess after DVT study results.  2:56 PM US  resulted no evidence of DVT, complex Baker's cyst in the popliteal fossa.  //  Reassessment performed. Patient appears stable.  She is feeling better after treatment.  Reviewed pertinent lab work and imaging with patient and son at bedside. Questions answered.   Most current vital signs reviewed and are as follows: BP 123/65 (BP Location: Left Arm)   Pulse 78   Temp 97.7 F (36.5 C) (Oral)   Resp 17   SpO2 99%   Plan: Discharge to home.   Prescriptions written for: Medrol  Dosepak  Other home  care instructions discussed: Continued use of home pain medications, rest, heat, gentle stretching  ED return instructions discussed: Return with worsening pain, swelling, inability to walk, fever.  Follow-up instructions discussed: Patient encouraged to follow-up with their PCP in 7 days.  Medical Decision Making Risk Prescription drug management.   Back pain/leg pain: suspect radiculopathy, sent back for DVT study today which was negative except for a Baker's cyst which may be contributing to some symptoms but does not explain all of her symptoms.  She has good distal pulses and I do not suspect arterial compromise.  No claudication symptoms.  Patient was given pain control in the emergency department with good improvement.  She has medication at home.  Encouraged close outpatient follow-up.  The patient's vital signs, pertinent lab work and imaging were reviewed and interpreted as discussed in the ED course. Hospitalization was considered for further testing, treatments, or serial exams/observation. However as patient is well-appearing, has a stable exam, and reassuring studies today, I do not feel that they warrant admission at this time. This plan was discussed with the patient who verbalizes agreement and comfort with this plan and seems reliable and able to return to the Emergency Department with worsening or changing symptoms.        Final diagnoses:  Left leg pain  Synovial cyst of left popliteal space    ED Discharge Orders          Ordered    methylPREDNISolone  (MEDROL  DOSEPAK) 4 MG TBPK tablet        11/22/24 1520               Desiderio Chew, [place] 11/22/24 2242  "

## 2024-11-24 ENCOUNTER — Telehealth: Payer: Self-pay | Admitting: Orthopedic Surgery

## 2024-11-24 NOTE — Telephone Encounter (Signed)
 Pt called asking for a sooner appt then 1/28. Pt went to ER twice. Pt number is [phone].

## 2024-11-25 NOTE — Telephone Encounter (Signed)
 She needs to see Dr. Onetha.  I looked at the ER and I am happy to see her next week but this really looks like lumbar radiculopathy and her hips did not look bad on radiographs done late last year.

## 2024-11-25 NOTE — Telephone Encounter (Signed)
 IC s/w patient and adviesd she will keep scheduled appt

## 2024-12-02 ENCOUNTER — Other Ambulatory Visit: Payer: Self-pay

## 2024-12-02 ENCOUNTER — Ambulatory Visit: Admitting: Orthopedic Surgery

## 2024-12-02 DIAGNOSIS — M7062 Trochanteric bursitis, left hip: Secondary | ICD-10-CM

## 2024-12-02 DIAGNOSIS — M25552 Pain in left hip: Secondary | ICD-10-CM | POA: Diagnosis not present

## 2024-12-02 NOTE — Progress Notes (Signed)
 Left knee pain.  Patient has back issues as well.  Also describes some trochanteric tenderness on that left-hand side.  Has a history of left hip greater trochanteric injection which has given her relief in the past.  She has a total knee replacement on the left-hand side which is over 85 years old.  The pain definitely radiates up and down her leg when it is bad.  She also reports global pain around the knee.  Denies much in the way of groin pain.  Does have chronic and longstanding back pain.  Office Visit Note   Patient: Wendy Carrillo           Date of Birth: 1941/06/06           MRN: [id number] Visit Date: 12/02/2024 Requested by: Valma Carwin, MD [id number] [place],  [place] [address] PCP: Valma Carwin, MD  Subjective: Chief Complaint  Patient presents with   Left Knee - Pain    HPI: Wendy Carrillo is a 84 y.o. female who presents to the office reporting Left knee pain.  Patient has back issues as well.  Also describes some trochanteric tenderness on that left-hand side.  Has a history of left hip greater trochanteric injection which has given her relief in the past.  She has a total knee replacement on the left-hand side which is over 33 years old.  The pain definitely radiates up and down her leg when it is bad.  She also reports global pain around the knee.  Denies much in the way of groin pain.  Does have chronic and longstanding back pain..                ROS: All systems reviewed are negative as they relate to the chief complaint within the history of present illness.  Patient denies fevers or chills.  Assessment & Plan: Visit Diagnoses:  1. Pain in left hip     Plan: Impression is left leg pain.  The knee looks pretty reasonable.  No effusion and good range of motion.  No indication for revision surgery at this time on the left knee.  She does have some trochanteric tenderness.  Ultrasound-guided trochanteric injection performed today.  Will see if that helps some of her  symptoms.  She is in a pickle because she has back pain as well as some radicular leg pain which is likely not rising to the level of surgical intervention but she also has some knee pain which also is present but does not show any clear-cut pathology which requires intervention in terms of the risk-benefit ratio.  Follow-Up Instructions: No follow-ups on file.   Orders:  Orders Placed This Encounter  Procedures   US  Guided Needle Placement - No Linked Charges   No orders of the defined types were placed in this encounter.     Procedures: Large Joint Inj: L greater trochanter on 12/02/2024 2:11 PM Indications: pain and diagnostic evaluation Details: 22 G 3.5 in needle, ultrasound-guided lateral approach  Arthrogram: No  Medications: 5 mL lidocaine  1 %; 80 mg methylPREDNISolone  acetate 80 MG/ML; 4 mL bupivacaine  0.25 % Outcome: tolerated well, no immediate complications Procedure, treatment alternatives, risks and benefits explained, specific risks discussed. Consent was given by the patient. Immediately prior to procedure a time out was called to verify the correct patient, procedure, equipment, support staff and site/side marked as required. Patient was prepped and draped in the usual sterile fashion.       Clinical Data: No  additional findings.  Objective: Vital Signs: There were no vitals taken for this visit.  Physical Exam:  Constitutional: Patient appears well-developed HEENT:  Head: Normocephalic Eyes:EOM are normal Neck: Normal range of motion Cardiovascular: Normal rate Pulmonary/chest: Effort normal Neurologic: Patient is alert Skin: Skin is warm Psychiatric: Patient has normal mood and affect  Ortho Exam: Ortho exam demonstrates good range of motion of the knee with no warmth and no effusion.  Has good ligament stability to varus valgus stress at 0 and 30 degrees.  No effusion in the knee.  No nerve root tension signs.  Does have some trochanteric tenderness on  the left compared to the right.  Normal gait.  Specialty Comments:  No specialty comments available.  Imaging: US  Guided Needle Placement - No Linked Charges Result Date: 12/02/2024 Ultrasound imaging demonstrates needle placement between the greater trochanter and the tensor fascia lata.  Injection of fluid with no complicating features    PMFS History: Patient Active Problem List   Diagnosis Date Noted   Pyelonephritis 10/21/2023   Pain in left hip 09/03/2023   Pleuritic chest pain 10/11/2022   Pyuria 10/11/2022   Hypotension 10/11/2022   Spinal stenosis at L4-L5 level 05/18/2019   Benign essential HTN    History of total bilateral knee replacement (TKR)    Acute blood loss anemia    Post-operative pain    Spinal stenosis of lumbar region 06/09/2018   Spinal stenosis, lumbar    Hypertension    History of duodenal ulcer    Hepatitis    Gastritis    Family history of adverse reaction to anesthesia    Chronic lower back pain    Arthritis    Coronary artery calcification 10/21/2017   Combined forms of age-related cataract of right eye 04/18/2017   Status post total bilateral knee replacement 06/08/2016   Left lumbar radiculopathy 05/09/2016   Bilateral shoulder pain 03/22/2016   DDD (degenerative disc disease), cervical 03/22/2016   Morbid obesity with BMI of 40.0-44.9, adult (HCC) 03/08/2016   Spondylolisthesis, 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/2016   Neck pain 03/08/2016   Lumbar stenosis 03/08/2016   Cervical spinal stenosis 03/08/2016   Back pain 11/16/2015   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/10/2015   Angina pectoris (HCC) 04/20/2015   Morbid obesity (HCC) 04/20/2015   Dyspnea 04/20/2015   Atypical chest pain 04/20/2015   Esophageal reflux 02/23/2015   Essential hypertension 02/23/2015   Chronic back pain 02/23/2015   Chest pain 02/22/2015   Abdominal tenderness, LLQ (left lower quadrant) 06/15/2014   Acute sinusitis 06/15/2014   Allergic rhinitis due to pollen  06/15/2014   Bladder disorder 06/15/2014   Epistaxis 06/15/2014   Female stress incontinence 06/15/2014   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   Insomnia 06/15/2014   Localized primary osteoarthritis of wrist 06/15/2014   Lumbosacral spondylosis 06/15/2014   Osteoarthrosis, localized, primary, hand 06/15/2014   Referred otalgia 06/15/2014   Tingling 06/15/2014   Tinnitus 06/15/2014   Urinary incontinence 06/15/2014   Cystocele 05/04/2014   Retention of urine 05/04/2014   Cystocele, lateral 04/12/2014   Rectocele 03/17/2014   History of blood transfusion 11/05/1990   Past Medical History:  Diagnosis Date   Arthritis    all over   Cervicalgia    Chronic low back pain    Chronic lower back pain    Coronary artery calcification    cardiology--- dr t. [place]---  nuclear study in epic 05-06-2015 no ischedmia, nuclear ef 51%;  cardiac cath 10-23-2017 mild coronary calcification in all 3V  without significant obstructive disease   Enlarged lymph node    left goin   GERD (gastroesophageal reflux disease)    History of duodenal ulcer    History of gastritis    History of hepatitis    1960  per pt had finger stick by needle was  in nursing school , does not remember  what type   Hypertension    followed by pcp   Spinal stenosis, lumbar    Ulnar neuropathy of right upper extremity    Wears glasses    Wears partial dentures    upper    Family History  Problem Relation Age of Onset   Hypertension Mother    Stroke Mother    Diabetes Mother    Heart attack Father    Breast cancer Sister    Leukemia Brother    Breast cancer Sister    Breast cancer Sister    Heart disease Brother     Past Surgical History:  Procedure Laterality Date   ABDOMINAL HERNIA REPAIR  1980s   ANTERIOR AND POSTERIOR VAGINAL REPAIR  2015   W/  MID-URETHRAL SLING AND SACROSPINOUS FIXATION   BACK SURGERY     BREAST CYST EXCISION Bilateral    BREAST SURGERY Left    nipple taken off   CARPAL TUNNEL RELEASE  Bilateral right 2010;  left 2012   CATARACT EXTRACTION W/ INTRAOCULAR LENS IMPLANT Left 2014   CATARACT EXTRACTION W/ INTRAOCULAR LENS IMPLANT Bilateral right 2018;  left ?   CHOLECYSTECTOMY OPEN  1980's   AND APPENDECTOMY   COLONOSCOPY W/ BIOPSIES AND POLYPECTOMY  last one 01/ 2017   DILATION AND CURETTAGE OF UTERUS  several   HERNIA REPAIR     LEFT HEART CATH AND CORONARY ANGIOGRAPHY N/A 10/23/2017   Procedure: LEFT HEART CATH AND CORONARY ANGIOGRAPHY;  Surgeon: Burnard Debby LABOR, MD;  Location: [facility];  Service: Cardiovascular;  Laterality: N/A;   LUMBAR LAMINECTOMY/DECOMPRESSION MICRODISCECTOMY Bilateral 06/09/2018   Procedure: Laminectomy and Foraminotomy - Lumbar Two,Lumbar Three - Lumbar One- Two - Lumbar Three-Four- bilateral;  Surgeon: Onetha Kuba, MD;  Location[facility];  Service: Neurosurgery;  Laterality: Bilateral;   LUMBAR LAMINECTOMY/DECOMPRESSION MICRODISCECTOMY Bilateral 06/01/2020   Procedure: Laminectomy and Foraminotomy - left - Lumbar Two-Lumbar Three -  - bilateral - Lumbar Four-Lumbar Five;  Surgeon: Onetha Kuba, MD;  Location: [facility] OR;  Service: Neurosurgery;  Laterality: Bilateral;  Laminectomy and Foraminotomy - left - Lumbar Two-Lumbar Three -  - bilateral - Lumbar Four-Lumbar Five    LYMPH NODE BIOPSY Left 11/28/2020   Procedure: LEFT GROIN LYMPH NODE EXCISIONAL BIOPSY;  Surgeon: Stevie, Herlene Righter, MD;  Location: [facility];  Service: General;  Laterality: Left;   MULTIPLE TOOTH EXTRACTIONS     PATELLA FRACTURE SURGERY Left 1990s   after the replacement   REDUCTION MAMMAPLASTY Bilateral 2004   TOTAL KNEE ARTHROPLASTY Bilateral left 1992;  right 2001   TRANSURETHRAL RESECTION OF BLADDER TUMOR WITH GYRUS (TURBT-GYRUS)  2014   VAGINAL HYSTERECTOMY  1972   Social History   Occupational History   Not on file  Tobacco Use   Smoking status: Never   Smokeless tobacco: Never  Vaping Use   Vaping status: Never Used  Substance and Sexual  Activity   Alcohol use: No   Drug use: Never   Sexual activity: Not on file

## 2024-12-04 ENCOUNTER — Encounter: Payer: Self-pay | Admitting: Orthopedic Surgery

## 2024-12-04 MED ORDER — LIDOCAINE HCL 1 % IJ SOLN
5.0000 mL | INTRAMUSCULAR | Status: AC | PRN
  Administered 2024-12-02: 5 mL

## 2024-12-04 MED ORDER — BUPIVACAINE HCL 0.25 % IJ SOLN
4.0000 mL | INTRAMUSCULAR | Status: AC | PRN
  Administered 2024-12-02: 4 mL via INTRA_ARTICULAR

## 2024-12-04 MED ORDER — METHYLPREDNISOLONE ACETATE 80 MG/ML IJ SUSP
80.0000 mg | INTRAMUSCULAR | Status: AC | PRN
  Administered 2024-12-02: 80 mg via INTRA_ARTICULAR
